# Patient Record
Sex: Female | Born: 1960 | Race: White | Hispanic: No | Marital: Married | Smoking: Former smoker
Health system: Southern US, Community
[De-identification: ages and names within clinical notes are randomized; demographics above are authoritative.]

## PROBLEM LIST (undated history)

## (undated) DIAGNOSIS — Z86718 Personal history of other venous thrombosis and embolism: Secondary | ICD-10-CM

## (undated) DIAGNOSIS — E119 Type 2 diabetes mellitus without complications: Secondary | ICD-10-CM

## (undated) DIAGNOSIS — K529 Noninfective gastroenteritis and colitis, unspecified: Secondary | ICD-10-CM

## (undated) DIAGNOSIS — M549 Dorsalgia, unspecified: Secondary | ICD-10-CM

## (undated) DIAGNOSIS — M4800 Spinal stenosis, site unspecified: Secondary | ICD-10-CM

## (undated) DIAGNOSIS — Z973 Presence of spectacles and contact lenses: Secondary | ICD-10-CM

## (undated) DIAGNOSIS — F329 Major depressive disorder, single episode, unspecified: Secondary | ICD-10-CM

## (undated) DIAGNOSIS — Z86711 Personal history of pulmonary embolism: Secondary | ICD-10-CM

## (undated) DIAGNOSIS — F988 Other specified behavioral and emotional disorders with onset usually occurring in childhood and adolescence: Secondary | ICD-10-CM

## (undated) DIAGNOSIS — N201 Calculus of ureter: Secondary | ICD-10-CM

## (undated) DIAGNOSIS — Z8719 Personal history of other diseases of the digestive system: Secondary | ICD-10-CM

## (undated) DIAGNOSIS — R3915 Urgency of urination: Secondary | ICD-10-CM

## (undated) DIAGNOSIS — I10 Essential (primary) hypertension: Secondary | ICD-10-CM

## (undated) DIAGNOSIS — J4530 Mild persistent asthma, uncomplicated: Secondary | ICD-10-CM

## (undated) DIAGNOSIS — D649 Anemia, unspecified: Secondary | ICD-10-CM

## (undated) DIAGNOSIS — M1990 Unspecified osteoarthritis, unspecified site: Secondary | ICD-10-CM

## (undated) DIAGNOSIS — R600 Localized edema: Secondary | ICD-10-CM

## (undated) DIAGNOSIS — I2699 Other pulmonary embolism without acute cor pulmonale: Secondary | ICD-10-CM

## (undated) DIAGNOSIS — G4730 Sleep apnea, unspecified: Secondary | ICD-10-CM

## (undated) DIAGNOSIS — G2581 Restless legs syndrome: Secondary | ICD-10-CM

## (undated) DIAGNOSIS — E559 Vitamin D deficiency, unspecified: Secondary | ICD-10-CM

## (undated) DIAGNOSIS — E538 Deficiency of other specified B group vitamins: Secondary | ICD-10-CM

## (undated) DIAGNOSIS — F419 Anxiety disorder, unspecified: Secondary | ICD-10-CM

## (undated) DIAGNOSIS — K219 Gastro-esophageal reflux disease without esophagitis: Secondary | ICD-10-CM

## (undated) DIAGNOSIS — R5382 Chronic fatigue, unspecified: Secondary | ICD-10-CM

## (undated) DIAGNOSIS — J189 Pneumonia, unspecified organism: Secondary | ICD-10-CM

## (undated) DIAGNOSIS — M2550 Pain in unspecified joint: Secondary | ICD-10-CM

## (undated) DIAGNOSIS — M069 Rheumatoid arthritis, unspecified: Secondary | ICD-10-CM

## (undated) DIAGNOSIS — F411 Generalized anxiety disorder: Secondary | ICD-10-CM

## (undated) DIAGNOSIS — G9332 Myalgic encephalomyelitis/chronic fatigue syndrome: Secondary | ICD-10-CM

## (undated) DIAGNOSIS — Z87442 Personal history of urinary calculi: Secondary | ICD-10-CM

## (undated) DIAGNOSIS — R0600 Dyspnea, unspecified: Secondary | ICD-10-CM

## (undated) DIAGNOSIS — L03116 Cellulitis of left lower limb: Secondary | ICD-10-CM

## (undated) DIAGNOSIS — M25562 Pain in left knee: Secondary | ICD-10-CM

## (undated) DIAGNOSIS — K589 Irritable bowel syndrome without diarrhea: Secondary | ICD-10-CM

## (undated) DIAGNOSIS — R7303 Prediabetes: Secondary | ICD-10-CM

## (undated) DIAGNOSIS — F32A Depression, unspecified: Secondary | ICD-10-CM

## (undated) DIAGNOSIS — M797 Fibromyalgia: Secondary | ICD-10-CM

## (undated) HISTORY — DX: Personal history of other venous thrombosis and embolism: Z86.718

## (undated) HISTORY — DX: Myalgic encephalomyelitis/chronic fatigue syndrome: G93.32

## (undated) HISTORY — DX: Prediabetes: R73.03

## (undated) HISTORY — DX: Dyspnea, unspecified: R06.00

## (undated) HISTORY — DX: Anxiety disorder, unspecified: F41.9

## (undated) HISTORY — DX: Chronic fatigue, unspecified: R53.82

## (undated) HISTORY — DX: Irritable bowel syndrome, unspecified: K58.9

## (undated) HISTORY — DX: Type 2 diabetes mellitus without complications: E11.9

## (undated) HISTORY — DX: Localized edema: R60.0

## (undated) HISTORY — DX: Sleep apnea, unspecified: G47.30

## (undated) HISTORY — DX: Vitamin D deficiency, unspecified: E55.9

## (undated) HISTORY — DX: Unspecified osteoarthritis, unspecified site: M19.90

## (undated) HISTORY — DX: Rheumatoid arthritis, unspecified: M06.9

## (undated) HISTORY — DX: Other specified behavioral and emotional disorders with onset usually occurring in childhood and adolescence: F98.8

## (undated) HISTORY — DX: Depression, unspecified: F32.A

## (undated) HISTORY — DX: Anemia, unspecified: D64.9

## (undated) HISTORY — PX: MOUTH SURGERY: SHX715

## (undated) HISTORY — DX: Pain in left knee: M25.562

## (undated) HISTORY — PX: EYE SURGERY: SHX253

## (undated) HISTORY — DX: Noninfective gastroenteritis and colitis, unspecified: K52.9

## (undated) HISTORY — DX: Spinal stenosis, site unspecified: M48.00

## (undated) HISTORY — DX: Major depressive disorder, single episode, unspecified: F32.9

## (undated) HISTORY — DX: Pain in unspecified joint: M25.50

## (undated) HISTORY — DX: Dorsalgia, unspecified: M54.9

## (undated) HISTORY — DX: Gastro-esophageal reflux disease without esophagitis: K21.9

## (undated) HISTORY — DX: Deficiency of other specified B group vitamins: E53.8

## (undated) HISTORY — DX: Restless legs syndrome: G25.81

---

## 1997-11-13 ENCOUNTER — Other Ambulatory Visit: Admission: RE | Admit: 1997-11-13 | Discharge: 1997-11-13 | Payer: Self-pay | Admitting: Obstetrics and Gynecology

## 1997-12-23 ENCOUNTER — Encounter: Admission: RE | Admit: 1997-12-23 | Discharge: 1998-03-23 | Payer: Self-pay | Admitting: Gynecology

## 1998-02-07 ENCOUNTER — Inpatient Hospital Stay (HOSPITAL_COMMUNITY): Admission: AD | Admit: 1998-02-07 | Discharge: 1998-02-07 | Payer: Self-pay | Admitting: Ophthalmology

## 1998-04-16 ENCOUNTER — Encounter: Admission: RE | Admit: 1998-04-16 | Discharge: 1998-07-15 | Payer: Self-pay | Admitting: Obstetrics and Gynecology

## 1998-06-06 ENCOUNTER — Inpatient Hospital Stay (HOSPITAL_COMMUNITY): Admission: AD | Admit: 1998-06-06 | Discharge: 1998-06-06 | Payer: Self-pay | Admitting: Obstetrics and Gynecology

## 1998-06-07 ENCOUNTER — Inpatient Hospital Stay (HOSPITAL_COMMUNITY): Admission: AD | Admit: 1998-06-07 | Discharge: 1998-06-07 | Payer: Self-pay | Admitting: Gynecology

## 1998-06-14 ENCOUNTER — Inpatient Hospital Stay (HOSPITAL_COMMUNITY): Admission: AD | Admit: 1998-06-14 | Discharge: 1998-06-17 | Payer: Self-pay | Admitting: Obstetrics and Gynecology

## 1998-07-26 ENCOUNTER — Other Ambulatory Visit: Admission: RE | Admit: 1998-07-26 | Discharge: 1998-07-26 | Payer: Self-pay | Admitting: Gynecology

## 2000-03-20 ENCOUNTER — Encounter: Admission: RE | Admit: 2000-03-20 | Discharge: 2000-03-20 | Payer: Self-pay | Admitting: Orthopaedic Surgery

## 2000-03-20 ENCOUNTER — Encounter: Payer: Self-pay | Admitting: Orthopaedic Surgery

## 2000-05-30 ENCOUNTER — Other Ambulatory Visit: Admission: RE | Admit: 2000-05-30 | Discharge: 2000-05-30 | Payer: Self-pay | Admitting: Obstetrics and Gynecology

## 2000-06-13 ENCOUNTER — Other Ambulatory Visit: Admission: RE | Admit: 2000-06-13 | Discharge: 2000-06-13 | Payer: Self-pay | Admitting: Obstetrics and Gynecology

## 2000-06-13 ENCOUNTER — Encounter (INDEPENDENT_AMBULATORY_CARE_PROVIDER_SITE_OTHER): Payer: Self-pay

## 2001-06-19 ENCOUNTER — Other Ambulatory Visit: Admission: RE | Admit: 2001-06-19 | Discharge: 2001-06-19 | Payer: Self-pay | Admitting: Obstetrics and Gynecology

## 2002-12-11 ENCOUNTER — Other Ambulatory Visit: Admission: RE | Admit: 2002-12-11 | Discharge: 2002-12-11 | Payer: Self-pay | Admitting: Obstetrics and Gynecology

## 2004-01-18 ENCOUNTER — Other Ambulatory Visit: Admission: RE | Admit: 2004-01-18 | Discharge: 2004-01-18 | Payer: Self-pay | Admitting: Obstetrics and Gynecology

## 2005-08-28 ENCOUNTER — Other Ambulatory Visit: Admission: RE | Admit: 2005-08-28 | Discharge: 2005-08-28 | Payer: Self-pay | Admitting: Obstetrics and Gynecology

## 2006-09-03 ENCOUNTER — Other Ambulatory Visit: Admission: RE | Admit: 2006-09-03 | Discharge: 2006-09-03 | Payer: Self-pay | Admitting: Obstetrics and Gynecology

## 2007-11-05 ENCOUNTER — Other Ambulatory Visit: Admission: RE | Admit: 2007-11-05 | Discharge: 2007-11-05 | Payer: Self-pay | Admitting: Gynecology

## 2010-07-31 HISTORY — PX: COLONOSCOPY: SHX174

## 2010-09-13 ENCOUNTER — Encounter: Payer: Self-pay | Admitting: Gynecology

## 2010-09-22 ENCOUNTER — Other Ambulatory Visit: Payer: Self-pay | Admitting: Gynecology

## 2010-09-26 ENCOUNTER — Encounter: Payer: Self-pay | Admitting: Gynecology

## 2010-09-29 ENCOUNTER — Encounter: Payer: Self-pay | Admitting: Gynecology

## 2010-09-30 ENCOUNTER — Inpatient Hospital Stay (INDEPENDENT_AMBULATORY_CARE_PROVIDER_SITE_OTHER)
Admission: RE | Admit: 2010-09-30 | Discharge: 2010-09-30 | Disposition: A | Discharge status: Home / Self Care | Payer: 59 | Source: Ambulatory Visit | Attending: Emergency Medicine | Admitting: Emergency Medicine

## 2010-09-30 DIAGNOSIS — J45909 Unspecified asthma, uncomplicated: Secondary | ICD-10-CM

## 2010-09-30 DIAGNOSIS — J019 Acute sinusitis, unspecified: Secondary | ICD-10-CM

## 2010-10-27 ENCOUNTER — Other Ambulatory Visit (HOSPITAL_COMMUNITY)
Admission: RE | Admit: 2010-10-27 | Discharge: 2010-10-27 | Disposition: A | Discharge status: Home / Self Care | Payer: 59 | Source: Ambulatory Visit | Attending: Gynecology | Admitting: Gynecology

## 2010-10-27 ENCOUNTER — Ambulatory Visit (INDEPENDENT_AMBULATORY_CARE_PROVIDER_SITE_OTHER): Payer: 59 | Admitting: Gynecology

## 2010-10-27 ENCOUNTER — Other Ambulatory Visit: Payer: Self-pay | Admitting: Gynecology

## 2010-10-27 DIAGNOSIS — Z01419 Encounter for gynecological examination (general) (routine) without abnormal findings: Secondary | ICD-10-CM

## 2010-10-27 DIAGNOSIS — N920 Excessive and frequent menstruation with regular cycle: Secondary | ICD-10-CM

## 2010-10-27 DIAGNOSIS — Z124 Encounter for screening for malignant neoplasm of cervix: Secondary | ICD-10-CM | POA: Insufficient documentation

## 2010-10-27 DIAGNOSIS — B373 Candidiasis of vulva and vagina: Secondary | ICD-10-CM

## 2010-10-27 DIAGNOSIS — Z13220 Encounter for screening for lipoid disorders: Secondary | ICD-10-CM

## 2010-10-27 DIAGNOSIS — B3731 Acute candidiasis of vulva and vagina: Secondary | ICD-10-CM

## 2010-10-27 DIAGNOSIS — R82998 Other abnormal findings in urine: Secondary | ICD-10-CM

## 2010-10-27 DIAGNOSIS — Z833 Family history of diabetes mellitus: Secondary | ICD-10-CM

## 2011-06-09 ENCOUNTER — Emergency Department (HOSPITAL_COMMUNITY)
Admission: EM | Admit: 2011-06-09 | Discharge: 2011-06-10 | Disposition: A | Discharge status: Home / Self Care | Payer: Worker's Compensation | Attending: Emergency Medicine | Admitting: Emergency Medicine

## 2011-06-09 DIAGNOSIS — W19XXXA Unspecified fall, initial encounter: Secondary | ICD-10-CM

## 2011-06-09 DIAGNOSIS — S93409A Sprain of unspecified ligament of unspecified ankle, initial encounter: Secondary | ICD-10-CM | POA: Insufficient documentation

## 2011-06-09 DIAGNOSIS — M25569 Pain in unspecified knee: Secondary | ICD-10-CM | POA: Insufficient documentation

## 2011-06-09 DIAGNOSIS — S8000XA Contusion of unspecified knee, initial encounter: Secondary | ICD-10-CM | POA: Insufficient documentation

## 2011-06-09 DIAGNOSIS — M25539 Pain in unspecified wrist: Secondary | ICD-10-CM | POA: Insufficient documentation

## 2011-06-09 DIAGNOSIS — S63509A Unspecified sprain of unspecified wrist, initial encounter: Secondary | ICD-10-CM | POA: Insufficient documentation

## 2011-06-09 DIAGNOSIS — T148XXA Other injury of unspecified body region, initial encounter: Secondary | ICD-10-CM

## 2011-06-09 DIAGNOSIS — J45909 Unspecified asthma, uncomplicated: Secondary | ICD-10-CM | POA: Insufficient documentation

## 2011-06-09 DIAGNOSIS — M25579 Pain in unspecified ankle and joints of unspecified foot: Secondary | ICD-10-CM | POA: Insufficient documentation

## 2011-06-09 DIAGNOSIS — M7989 Other specified soft tissue disorders: Secondary | ICD-10-CM | POA: Insufficient documentation

## 2011-06-09 DIAGNOSIS — R296 Repeated falls: Secondary | ICD-10-CM | POA: Insufficient documentation

## 2011-06-09 DIAGNOSIS — S96919A Strain of unspecified muscle and tendon at ankle and foot level, unspecified foot, initial encounter: Secondary | ICD-10-CM

## 2011-06-09 HISTORY — DX: Unspecified osteoarthritis, unspecified site: M19.90

## 2011-06-09 HISTORY — DX: Fibromyalgia: M79.7

## 2011-06-10 ENCOUNTER — Emergency Department (HOSPITAL_COMMUNITY): Payer: Worker's Compensation

## 2011-06-10 ENCOUNTER — Encounter: Payer: Self-pay | Admitting: *Deleted

## 2011-06-10 MED ORDER — HYDROCODONE-ACETAMINOPHEN 5-325 MG PO TABS
2.0000 | ORAL_TABLET | Freq: Once | ORAL | Status: AC
  Administered 2011-06-10: 2 via ORAL
  Filled 2011-06-10: qty 2

## 2011-06-10 NOTE — ED Provider Notes (Signed)
History     CSN: [id number] Arrival date & time: 06/09/2011 11:57 PM   First MD Initiated Contact with Patient 06/10/11 0024      Chief Complaint  Patient presents with  . Fall    Patient is a 50 y.o. female presenting with fall.  Fall Pertinent negatives include no numbness and no headaches.   history provided by the patient. Patient reports tripping and falling while outside earlier today around 3 PM. Patient was helping schoolchildren get on a bus. She fell forward onto her hands and knees. Also states that she thinks she twisted right ankle. She went home to arrest but has not gotten any improvement in pain. She is worried she may have done more damage than she originally thought. She has a greatest amount of pain in left knee. She has continued to be ambulatory but notes having to limp somewhat. She also states she is unable to extend the right wrist fully. She denies numbness or tingling. There was no loss of consciousness or head injury.    Past Medical History  Diagnosis Date  . Asthma   . Arthritis   . Fibromyalgia     History reviewed. No pertinent past surgical history.  History reviewed. No pertinent family history.  History  Substance Use Topics  . Smoking status: Never Smoker   . Smokeless tobacco: Not on file  . Alcohol Use: No    OB History    Grav Para Term Preterm Abortions TAB SAB Ect Mult Living                  Review of Systems  Respiratory: Negative for cough and shortness of breath.   Cardiovascular: Negative for chest pain and palpitations.  Neurological: Negative for dizziness, weakness, numbness and headaches.  All other systems reviewed and are negative.    Allergies  Review of patient's allergies indicates no known allergies.  Home Medications   Current Outpatient Rx  Name Route Sig Dispense Refill  . DULOXETINE HCL 60 MG PO CPEP Oral Take 60 mg by mouth daily.      . IBUPROFEN 200 MG PO TABS Oral Take 200-400 mg by mouth every 6  (six) hours as needed. For pain     . THERA M PLUS PO TABS Oral Take 1 tablet by mouth daily.        BP 126/81  Pulse 95  Temp(Src) 97.1 F (36.2 C) (Oral)  Resp 20  SpO2 97%  LMP 06/02/2011  Physical Exam  Nursing note and vitals reviewed. Constitutional: She is oriented to person, place, and time. She appears well-developed and well-nourished. No distress.  HENT:  Head: Normocephalic and atraumatic.       No Battle sign or raccoon eyes  Eyes: Conjunctivae are normal.  Neck: Normal range of motion. Neck supple.       No cervical midline tenderness.  Cardiovascular: Normal rate.   No murmur heard. Pulmonary/Chest: Effort normal and breath sounds normal.  Abdominal: Soft. There is no tenderness.  Musculoskeletal:       Mild diffuse dorsal swelling to right wrist with reduced range of motion. Positive snuffbox tenderness. Full range of motion of the fingers with normal sensation and some Refill. Contusion to medial lower left knee with swelling around knee and tenderness to palpation. no deformity of the knee. Full range of motion of left knee. Mild swelling over anterior lateral right ankle. Tenderness to palpation over proximal right fifth metatarsal. All movement of toes with normal  sensation and cap refill.  Neurological: She is alert and oriented to person, place, and time.  Skin: Skin is warm.    ED Course  Procedures (including critical care time)  Labs Reviewed - No data to display Dg Wrist Complete Right  06/10/2011  *RADIOLOGY REPORT*  Clinical Data: Status post fall; medial right wrist pain, extending to the fifth metacarpal.  RIGHT WRIST - COMPLETE 3+ VIEW  Comparison: None.  Findings: There is no evidence of fracture or dislocation.  The carpal rows are intact, and demonstrate normal alignment. Significant degenerative change is noted at the first carpometacarpal joint, with bony remodelling and subcortical cyst formation.  Negative ulnar variance is noted.  No  significant soft tissue abnormalities are seen.  IMPRESSION:  1.  No evidence of fracture or dislocation. 2.  Significant degenerative change at the first carpometacarpal joint.  Original Report Authenticated By: Tonia Ghent, M.D.   Dg Ankle Complete Right  06/10/2011  *RADIOLOGY REPORT*  Clinical Data: Status post fall, with right ankle pain.  RIGHT ANKLE - COMPLETE 3+ VIEW  Comparison: None.  Findings: There is no evidence of fracture or dislocation.  The ankle mortise is intact; the interosseous space is within normal limits.  No talar tilt or subluxation is seen. A plantar calcaneal spur is seen.  An apparent os peroneum is noted.  Small osseous fragments along the dorsum of the foot are likely degenerative in nature.  The joint spaces are preserved.  Dorsal soft tissue swelling is noted.  IMPRESSION:  1.  No evidence of fracture or dislocation. 2.  Os peroneum noted.  Original Report Authenticated By: Tonia Ghent, M.D.   Dg Knee Complete 4 Views Left  06/10/2011  *RADIOLOGY REPORT*  Clinical Data: Status post fall, with left anterior knee pain and swelling.  LEFT KNEE - COMPLETE 4+ VIEW  Comparison: None.  Findings: There is no evidence of fracture or dislocation.  There is narrowing of the medial and patellofemoral compartments, and marginal osteophytes are noted arising at all three compartments.  No significant joint effusion is seen.  The visualized soft tissues are normal in appearance.  IMPRESSION:  1.  No evidence of fracture or dislocation. 2.  Degenerative change at the left knee, with narrowing at the medial and patellofemoral compartments.  Original Report Authenticated By: Tonia Ghent, M.D.      1. Fall   2. Contusion   3. Wrist sprain   4. Ankle strain       MDM  Patient in no acute distress. X-rays ordered. Pain medication ordered.        Angus Seller, PA 06/10/11 1025

## 2011-06-10 NOTE — ED Notes (Signed)
The pt returned from xray.  Her pain is some better

## 2011-06-10 NOTE — ED Provider Notes (Signed)
Medical screening examination/treatment/procedure(s) were performed by non-physician practitioner and as supervising physician I was immediately available for consultation/collaboration.  Cyndra Numbers, MD 06/10/11 [phone]

## 2011-06-10 NOTE — ED Notes (Signed)
The pt fell on the concrete 1500 today .[name]  She has pain all over her body both knees  Rt foot and she has pain and stiffness rt wrsit.

## 2011-08-08 ENCOUNTER — Emergency Department (HOSPITAL_COMMUNITY): Payer: Self-pay

## 2011-08-08 ENCOUNTER — Encounter (HOSPITAL_COMMUNITY): Payer: Self-pay | Admitting: Emergency Medicine

## 2011-08-08 ENCOUNTER — Emergency Department (HOSPITAL_COMMUNITY)
Admission: EM | Admit: 2011-08-08 | Discharge: 2011-08-09 | Disposition: A | Discharge status: Home / Self Care | Payer: Self-pay | Attending: Emergency Medicine | Admitting: Emergency Medicine

## 2011-08-08 DIAGNOSIS — R197 Diarrhea, unspecified: Secondary | ICD-10-CM | POA: Insufficient documentation

## 2011-08-08 DIAGNOSIS — R5381 Other malaise: Secondary | ICD-10-CM | POA: Insufficient documentation

## 2011-08-08 DIAGNOSIS — M129 Arthropathy, unspecified: Secondary | ICD-10-CM | POA: Insufficient documentation

## 2011-08-08 DIAGNOSIS — R109 Unspecified abdominal pain: Secondary | ICD-10-CM | POA: Insufficient documentation

## 2011-08-08 DIAGNOSIS — J45909 Unspecified asthma, uncomplicated: Secondary | ICD-10-CM | POA: Insufficient documentation

## 2011-08-08 DIAGNOSIS — K529 Noninfective gastroenteritis and colitis, unspecified: Secondary | ICD-10-CM

## 2011-08-08 DIAGNOSIS — K5289 Other specified noninfective gastroenteritis and colitis: Secondary | ICD-10-CM | POA: Insufficient documentation

## 2011-08-08 DIAGNOSIS — R630 Anorexia: Secondary | ICD-10-CM | POA: Insufficient documentation

## 2011-08-08 DIAGNOSIS — Z79899 Other long term (current) drug therapy: Secondary | ICD-10-CM | POA: Insufficient documentation

## 2011-08-08 DIAGNOSIS — R5383 Other fatigue: Secondary | ICD-10-CM | POA: Insufficient documentation

## 2011-08-08 LAB — URINE MICROSCOPIC-ADD ON

## 2011-08-08 LAB — CBC
HCT: 38.4 % (ref 36.0–46.0)
MCHC: 33.3 g/dL (ref 30.0–36.0)
MCV: 85 fL (ref 78.0–100.0)
Platelets: 309 10*3/uL (ref 150–400)
RDW: 14.6 % (ref 11.5–15.5)
WBC: 7.6 10*3/uL (ref 4.0–10.5)

## 2011-08-08 LAB — URINALYSIS, ROUTINE W REFLEX MICROSCOPIC
Glucose, UA: NEGATIVE mg/dL
Hgb urine dipstick: NEGATIVE
Protein, ur: NEGATIVE mg/dL
Specific Gravity, Urine: 1.025 (ref 1.005–1.030)

## 2011-08-08 LAB — COMPREHENSIVE METABOLIC PANEL
ALT: 21 U/L (ref 0–35)
AST: 26 U/L (ref 0–37)
Albumin: 3.7 g/dL (ref 3.5–5.2)
CO2: 25 mEq/L (ref 19–32)
Calcium: 9 mg/dL (ref 8.4–10.5)
Creatinine, Ser: 0.77 mg/dL (ref 0.50–1.10)
GFR calc non Af Amer: >90 mL/min (ref >90)
Sodium: 136 mEq/L (ref 135–145)
Total Protein: 7.9 g/dL (ref 6.0–8.3)

## 2011-08-08 LAB — DIFFERENTIAL
Basophils Absolute: 0 10*3/uL (ref 0.0–0.1)
Basophils Relative: 0 % (ref 0–1)
Eosinophils Absolute: 0.1 10*3/uL (ref 0.0–0.7)
Eosinophils Relative: 1 % (ref 0–5)
Lymphocytes Relative: 27 % (ref 12–46)
Monocytes Absolute: 0.6 10*3/uL (ref 0.1–1.0)

## 2011-08-08 MED ORDER — SODIUM CHLORIDE 0.9 % IV SOLN
Freq: Once | INTRAVENOUS | Status: AC
  Administered 2011-08-08: 21:00:00 via INTRAVENOUS

## 2011-08-08 MED ORDER — ONDANSETRON HCL 4 MG/2ML IJ SOLN
4.0000 mg | Freq: Once | INTRAMUSCULAR | Status: AC
  Administered 2011-08-08: 4 mg via INTRAVENOUS
  Filled 2011-08-08: qty 2

## 2011-08-08 MED ORDER — KETOROLAC TROMETHAMINE 30 MG/ML IJ SOLN
30.0000 mg | Freq: Once | INTRAMUSCULAR | Status: AC
  Administered 2011-08-08: 30 mg via INTRAVENOUS
  Filled 2011-08-08: qty 1

## 2011-08-08 MED ORDER — IOHEXOL 300 MG/ML  SOLN
20.0000 mL | INTRAMUSCULAR | Status: DC | PRN
  Administered 2011-08-09: 20 mL via ORAL

## 2011-08-08 NOTE — ED Provider Notes (Signed)
History     CSN: [id number]  Arrival date & time 08/08/11  1600   First MD Initiated Contact with Patient 08/08/11 2012      Chief Complaint  Patient presents with  . Abdominal Pain  . Diarrhea    (Consider location/radiation/quality/duration/timing/severity/associated sxs/prior treatment) Patient is a 51 y.o. female presenting with abdominal pain and diarrhea. The history is provided by the patient.  Abdominal Pain The primary symptoms of the illness include abdominal pain, fatigue and diarrhea. The primary symptoms of the illness do not include fever. The current episode started more than 2 days ago. The onset of the illness was gradual. The problem has been gradually worsening.  The patient states that she believes she is currently not pregnant. The patient has had a change in bowel habit. Additional symptoms associated with the illness include anorexia. Symptoms associated with the illness do not include chills, constipation, urgency, hematuria, frequency or back pain.  Diarrhea The primary symptoms include fatigue, abdominal pain and diarrhea. Primary symptoms do not include fever.  The illness is also significant for anorexia. The illness does not include chills, constipation or back pain.    Past Medical History  Diagnosis Date  . Asthma   . Arthritis   . Fibromyalgia     History reviewed. No pertinent past surgical history.  History reviewed. No pertinent family history.  History  Substance Use Topics  . Smoking status: Never Smoker   . Smokeless tobacco: Not on file  . Alcohol Use: No    OB History    Grav Para Term Preterm Abortions TAB SAB Ect Mult Living                  Review of Systems  Constitutional: Positive for fatigue. Negative for fever and chills.  Gastrointestinal: Positive for abdominal pain, diarrhea and anorexia. Negative for constipation.  Genitourinary: Negative for urgency, frequency and hematuria.  Musculoskeletal: Negative for back  pain.  All other systems reviewed and are negative.    Allergies  Review of patient's allergies indicates no known allergies.  Home Medications   Current Outpatient Rx  Name Route Sig Dispense Refill  . ALBUTEROL SULFATE HFA 108 (90 BASE) MCG/ACT IN AERS Inhalation Inhale 2 puffs into the lungs every 6 (six) hours as needed. FOR WHEEZING     . DIPHENHYDRAMINE-APAP (SLEEP) 25-500 MG PO TABS Oral Take 1 tablet by mouth at bedtime as needed. FOR SLEEP     . IBUPROFEN 200 MG PO TABS Oral Take 200-400 mg by mouth every 6 (six) hours as needed. For pain     . THERA M PLUS PO TABS Oral Take 1 tablet by mouth daily.      [name] PREGABALIN 150 MG PO CAPS Oral Take 150 mg by mouth daily.        BP 110/81  Pulse 91  Temp 97.5 F (36.4 C)  Resp 17  SpO2 98%  Physical Exam  Nursing note and vitals reviewed. Constitutional: She is oriented to person, place, and time. She appears well-developed and well-nourished. No distress.  HENT:  Head: Normocephalic and atraumatic.       Mm's dry  Neck: Normal range of motion. Neck supple.  Cardiovascular: Normal rate and regular rhythm.   No murmur heard. Pulmonary/Chest: Effort normal and breath sounds normal. No respiratory distress.  Abdominal: Soft. Bowel sounds are normal.       There is ttp in the lower abdomen, llq > rlq.  No rebound or guarding.  Musculoskeletal: Normal range of motion. She exhibits no edema.  Lymphadenopathy:    She has no cervical adenopathy.  Neurological: She is alert and oriented to person, place, and time.  Skin: Skin is warm. She is not diaphoretic.    ED Course  Procedures (including critical care time)   Labs Reviewed  CBC  DIFFERENTIAL  COMPREHENSIVE METABOLIC PANEL  LIPASE, BLOOD  URINALYSIS, ROUTINE W REFLEX MICROSCOPIC   No results found.   No diagnosis found.    MDM  The patient presents with a three day history of n/v/d and lower abdominal pain.  She has been unable to tolerate much po  intake.  The labs show a normal wbc count, uti, and mild elevation of the lipase.  As of now, the patient is waiting to undergo a ct scan of the abdomen and pelvis. The care will be signed out to Dr. Patria Mane who will follow up the results of the ct scan and determine the appropriate disposition.        Geoffery Lyons, MD 08/09/11 [phone]

## 2011-08-08 NOTE — ED Notes (Signed)
Pt c/o lower abd pain with diarrhea x 3 days; pt sts decreased PO intake; pt sts some chills

## 2011-08-08 NOTE — ED Notes (Signed)
Pt currently drinking contrast for CT. Resting comfortably.

## 2011-08-09 LAB — STOOL CULTURE

## 2011-08-09 LAB — CLOSTRIDIUM DIFFICILE BY PCR: Toxigenic C. Difficile by PCR: NEGATIVE

## 2011-08-09 LAB — URINE CULTURE: Colony Count: >=100000

## 2011-08-09 MED ORDER — METRONIDAZOLE 500 MG PO TABS
500.0000 mg | ORAL_TABLET | Freq: Once | ORAL | Status: AC
  Administered 2011-08-09: 500 mg via ORAL
  Filled 2011-08-09: qty 1

## 2011-08-09 MED ORDER — CIPROFLOXACIN HCL 500 MG PO TABS: 500.0000 mg | ORAL_TABLET | Freq: Two times a day (BID) | ORAL | Status: AC

## 2011-08-09 MED ORDER — HYDROCODONE-ACETAMINOPHEN 5-500 MG PO TABS: 1.0000 | ORAL_TABLET | Freq: Four times a day (QID) | ORAL | Status: AC | PRN

## 2011-08-09 MED ORDER — METRONIDAZOLE 500 MG PO TABS: 500.0000 mg | ORAL_TABLET | Freq: Two times a day (BID) | ORAL | Status: AC

## 2011-08-09 MED ORDER — DEXTROSE 5 % IV SOLN
1.0000 g | INTRAVENOUS | Status: DC
  Administered 2011-08-09: 1 g via INTRAVENOUS
  Filled 2011-08-09: qty 10

## 2011-08-09 MED ORDER — IOHEXOL 300 MG/ML  SOLN
100.0000 mL | Freq: Once | INTRAMUSCULAR | Status: AC | PRN
  Administered 2011-08-09: 100 mL via INTRAVENOUS

## 2011-08-09 MED ORDER — CIPROFLOXACIN HCL 500 MG PO TABS
500.0000 mg | ORAL_TABLET | Freq: Once | ORAL | Status: AC
  Administered 2011-08-09: 500 mg via ORAL
  Filled 2011-08-09: qty 1

## 2011-08-09 NOTE — ED Provider Notes (Signed)
3:16 AM Patient feels much better at this time.  Her CT scan demonstrates colitis which is nonspecific.  Given her watery diffuse diarrhea that has a very foul smell I will cover the patient for possible C. difficile colitis.  She's been given a prescription for Cipro and Flagyl.  C. difficile by PCR as well as stool cultures have been obtained and pending.  She understands to return to the emergency department for new or worsening symptoms.  She also understands the importance of close followup and development of a relationship with a primary care physician.  She reports she feels much better at this time.  Her urine may represent an infection, urine culture has been obtained, the patient received Rocephin in the emergency department should be covered by ciprofloxacin at home.  She does report mild dysuria. her vital signs are normal she feels much better and is agreeable to outpatient plan  Ct Abdomen Pelvis W Contrast  08/09/2011  *RADIOLOGY REPORT*  Clinical Data: Abdominal pain.  Chills.  Nausea and vomiting.  CT ABDOMEN AND PELVIS WITH CONTRAST  Technique:  Multidetector CT imaging of the abdomen and pelvis was performed following the standard protocol during bolus administration of intravenous contrast.  Contrast:  100 ml Omnipaque-300.  Comparison: None.  Findings: Lung bases are clear.  Visualized heart is normal.  Liver and gallbladder appear within normal limits.  There is diffuse thickening extending from cecum to mid transverse colon. Thickening becomes more mild approaching the splenic flexure.  The descending colon and sigmoid appear within normal limits.  Uterus and adnexa normal.  Urinary bladder normal.  Normal renal enhancement.  Both adrenal glands appear normal.  No adenopathy.  The pancreas appears normal.  No calcified gallstones. The abdominal vasculature appears within normal limits.  In the dome of the left hepatic lobe, there is a nonspecific sub centimeter liver lesion that probably  represents a cyst measuring 8 mm (image 8 series 2) but is incompletely characterized.  Small bowel appears within normal limits.  Ileocecal valve and appendix appear normal.  Thoracolumbar spondylosis.  No aggressive osseous lesions are identified.  4 mm left inferior pole nonobstructing renal collecting system calculus is present.  IMPRESSION: 1.  Diffuse colonic mural thickening extending from cecum to the splenic flexure.  Findings compatible with colitis.  Differential considerations include infection, inflammatory bowel disease with ischemia unlikely. 2.  Nonobstructing 4 mm left inferior pole renal collecting system calculus. 3.  Tiny low density lesion in the dome of the left hepatic lobe, incompletely evaluated but probably representing a cyst.  Original Report Authenticated By: Andreas Newport, M.D.   I personally reviewed his CT scan  Lyanne Co, MD 08/09/11 [phone]

## 2011-08-09 NOTE — ED Notes (Signed)
See paper charting 

## 2011-08-10 ENCOUNTER — Telehealth: Payer: Self-pay | Admitting: *Deleted

## 2011-08-10 MED ORDER — FLUCONAZOLE 200 MG PO TABS: 200.0000 mg | ORAL_TABLET | Freq: Every day | ORAL | Status: AC

## 2011-08-10 NOTE — Telephone Encounter (Signed)
Diflucan 200 mg x2 days

## 2011-08-10 NOTE — Telephone Encounter (Signed)
Patient called today to say she has been in the hospital with an intestine infection and has been on a number of antibiotics.  Is at home now in bed and has developed a yeast infection.  Wants to know if you could rx something for the yeast? Please advise.

## 2011-08-10 NOTE — Telephone Encounter (Signed)
Lm on vm that rx has been sent in.

## 2012-08-05 ENCOUNTER — Encounter: Payer: Self-pay | Admitting: Gynecology

## 2012-08-16 ENCOUNTER — Other Ambulatory Visit (HOSPITAL_COMMUNITY)
Admission: RE | Admit: 2012-08-16 | Discharge: 2012-08-16 | Disposition: A | Discharge status: Home / Self Care | Payer: BC Managed Care – PPO | Source: Ambulatory Visit | Attending: Gynecology | Admitting: Gynecology

## 2012-08-16 ENCOUNTER — Ambulatory Visit (INDEPENDENT_AMBULATORY_CARE_PROVIDER_SITE_OTHER): Payer: BC Managed Care – PPO | Admitting: Gynecology

## 2012-08-16 ENCOUNTER — Encounter: Payer: Self-pay | Admitting: Gynecology

## 2012-08-16 VITALS — BP 124/84 | Ht 65.0 in | Wt 250.0 lb

## 2012-08-16 DIAGNOSIS — N926 Irregular menstruation, unspecified: Secondary | ICD-10-CM

## 2012-08-16 DIAGNOSIS — Z1151 Encounter for screening for human papillomavirus (HPV): Secondary | ICD-10-CM | POA: Insufficient documentation

## 2012-08-16 DIAGNOSIS — G2581 Restless legs syndrome: Secondary | ICD-10-CM | POA: Insufficient documentation

## 2012-08-16 DIAGNOSIS — K589 Irritable bowel syndrome without diarrhea: Secondary | ICD-10-CM | POA: Insufficient documentation

## 2012-08-16 DIAGNOSIS — Z01419 Encounter for gynecological examination (general) (routine) without abnormal findings: Secondary | ICD-10-CM

## 2012-08-16 DIAGNOSIS — K529 Noninfective gastroenteritis and colitis, unspecified: Secondary | ICD-10-CM | POA: Insufficient documentation

## 2012-08-16 DIAGNOSIS — N951 Menopausal and female climacteric states: Secondary | ICD-10-CM

## 2012-08-16 NOTE — Patient Instructions (Signed)
Follow up with your primary physician for blood work and evaluation Schedule colonoscopy Follow up in one year for annual exam.  Call if prolonged or atypical bleeding

## 2012-08-16 NOTE — Progress Notes (Signed)
Sally [name] 30-Jan-1961 [id number]        52 y.o.  [id number] for annual exam.  Several issues noted below.  Past medical history,surgical history, medications, allergies, family history and social history were all reviewed and documented in the EPIC chart. ROS:  Was performed and pertinent positives and negatives are included in the history.  Exam: [facility] assistant Filed Vitals:   08/16/12 1049  BP: 124/84  Height: 5\' 5"  (1.651 m)  Weight: 250 lb (113.399 kg)   General appearance  Normal Skin grossly normal Head/Neck normal with no cervical or supraclavicular adenopathy thyroid normal Lungs  clear Cardiac RR, without RMG Abdominal  soft, nontender, without masses, organomegaly or hernia Breasts  examined lying and sitting without masses, retractions, discharge or axillary adenopathy. Pelvic  Ext/BUS/vagina  normal   Cervix  normal Pap/HPV  Uterus  Grossly normal normal size,  midline and mobile nontender   Adnexa  Without masses or tenderness    Anus and perineum  normal   Rectovaginal  normal sphincter tone without palpated masses or tenderness.    Assessment/Plan:  52 y.o. [id number] female for annual exam.   1. Perimenopausal. Patient having less frequent menses as they're spacing out this past year with LMP in November. No prolonged or atypical bleeding. Also having some hot flashes and sweats. Did have elevated FSH last year. Reviewed options to include observation, OTC soy based, HRT and nonpharmacologic prescriptions such as Effexor. At this point patient is not interested in treating. She would prefer observing and will consider OTC soy if the symptoms worsen or alternatives and will represent to discuss. I reviewed bleeding with her and as long as she has less frequent but normal menses then will watch. If she has prolonged or atypical bleeding or gross more than one year without bleeding and then has bleeding she knows to call. 2. Mammography 2012. Patient has mammogram  scheduled today and will follow up for this. SBE monthly reviewed. 3. Pap smear 2012. Pap/HPV done. No history of significant abnormalities and if this one is normal then we'll plan a less frequent screening interval. 4. Colonoscopy. Patient's ever had. I strongly recommended she schedule she somewhat reluctant and I stressed the need to do so and the benefits of early detection. 5. Health maintenance. Patient's having a lot of joint pain and back pain which I think is weight related. We discussed her weight and she just joined [employer] and started an exercise program. I recommended she follow up with her primary physician for evaluation and blood work to include cholesterol/diabetes screening. She is off of her medications as she used previously for her fibromyalgia and her pain have recommended she rediscuss this with her primary physicians and consider restarting her medications to include Cymbalta.  Again no blood work was done today as given her age and weight and other issues I think since she is needs to be seen by a primary physician for ongoing health maintenance. Patient agrees to schedule this appointment.  Follow up with me in a year or sooner if she wants to rediscuss menopausal symptom treatment or if her bleeding becomes significantly irregular.    Dara Lords MD, 11:57 AM 08/16/2012

## 2012-08-17 LAB — URINALYSIS W MICROSCOPIC + REFLEX CULTURE
Bacteria, UA: NONE SEEN
Casts: NONE SEEN
Glucose, UA: NEGATIVE mg/dL
Hgb urine dipstick: NEGATIVE
Ketones, ur: NEGATIVE mg/dL
Protein, ur: NEGATIVE mg/dL
pH: 5.5 (ref 5.0–8.0)

## 2012-08-19 ENCOUNTER — Encounter: Payer: Self-pay | Admitting: Gynecology

## 2012-10-08 DIAGNOSIS — Z029 Encounter for administrative examinations, unspecified: Secondary | ICD-10-CM

## 2013-03-20 ENCOUNTER — Other Ambulatory Visit: Payer: Self-pay

## 2014-04-24 ENCOUNTER — Encounter: Payer: Self-pay | Admitting: Gynecology

## 2014-04-24 ENCOUNTER — Ambulatory Visit (INDEPENDENT_AMBULATORY_CARE_PROVIDER_SITE_OTHER): Payer: No Typology Code available for payment source | Admitting: Gynecology

## 2014-04-24 VITALS — BP 142/88 | Ht 65.0 in | Wt 285.0 lb

## 2014-04-24 DIAGNOSIS — N912 Amenorrhea, unspecified: Secondary | ICD-10-CM

## 2014-04-24 DIAGNOSIS — Z01419 Encounter for gynecological examination (general) (routine) without abnormal findings: Secondary | ICD-10-CM

## 2014-04-24 NOTE — Progress Notes (Signed)
Sally [name] 1961-01-08 [id number]        53 y.o.  [id number] for annual exam.  Several issues noted below.  Past medical history,surgical history, problem list, medications, allergies, family history and social history were all reviewed and documented as reviewed in the EPIC chart.  ROS:  12 system ROS performed with pertinent positives and negatives included in the history, assessment and plan.   Additional significant findings :  none   Exam: [facility] person Vitals:   04/24/14 1138  BP: 142/88  Height: 5\' 5"  (1.651 m)  Weight: 285 lb (129.275 kg)   General appearance:  Normal affect, orientation and appearance. Skin: Grossly normal HEENT: Without gross lesions.  No cervical or supraclavicular adenopathy. Thyroid normal.  Lungs:  Clear without wheezing, rales or rhonchi Cardiac: RR, without RMG Abdominal:  Soft, nontender, without masses, guarding, rebound, organomegaly or hernia Breasts:  Examined lying and sitting without masses, retractions, discharge or axillary adenopathy. Pelvic:  Ext/BUS/vagina normal  Cervix normal  Uterus grossly normal size midline mobile nontender. Exam limited by abdominal girth  Adnexa  Without gross masses or tenderness    Anus and perineum  Normal   Rectovaginal  Normal sphincter tone without palpated masses or tenderness.    Assessment/Plan:  53 y.o. 53 female for annual exam .   1. Amenorrhea/menopausal symptoms. Patient relates last menstrual period was roughly January 2015. Has been no bleeding since then.  Is having some night sweats and hot flashes. Reviewed options to include OTC products such as soy versus HRT. Risk benefits of HRT reviewed to include the WHI study with increased risk of stroke heart attack DVT and breast cancer. ACOG and NAMS statements for lowest dose for shortest period of time reviewed. At this point patient is not interested but will try the OTC products. Patient will call if she does any vaginal bleeding.  Follow up if she wants to rediscuss HRT. 2. Pap/HPV negative 2014. No Pap smear done today. No history of significant abnormal Pap smears previously. 3. Mammography 03/2014. Continue with annual mammography. SBE monthly reviewed. 4. Colonoscopy 2015. Repeat at their recommended interval. 5. Health maintenance. Blood pressure noted 142/88. Recommended recheck in a non-examt situation. Follow up with her primary if continues elevated. No blood work done and she relates this done through her primary physician's office. Follow up in one year, sooner as needed.     04/2014 MD, 12:11 PM 04/24/2014

## 2014-04-24 NOTE — Patient Instructions (Signed)
You may obtain a copy of any labs that were done today by logging onto MyChart as outlined in the instructions provided with your AVS (after visit summary). The office will not call with normal lab results but certainly if there are any significant abnormalities then we will contact you.   Health Maintenance, Female A healthy lifestyle and preventative care can promote health and wellness.  Maintain regular health, dental, and eye exams.  Eat a healthy diet. Foods like vegetables, fruits, whole grains, low-fat dairy products, and lean protein foods contain the nutrients you need without too many calories. Decrease your intake of foods high in solid fats, added sugars, and salt. Get information about a proper diet from your caregiver, if necessary.  Regular physical exercise is one of the most important things you can do for your health. Most adults should get at least 150 minutes of moderate-intensity exercise (any activity that increases your heart rate and causes you to sweat) each week. In addition, most adults need muscle-strengthening exercises on 2 or more days a week.   Maintain a healthy weight. The body mass index (BMI) is a screening tool to identify possible weight problems. It provides an estimate of body fat based on height and weight. Your caregiver can help determine your BMI, and can help you achieve or maintain a healthy weight. For adults 20 years and older:  A BMI below 18.5 is considered underweight.  A BMI of 18.5 to 24.9 is normal.  A BMI of 25 to 29.9 is considered overweight.  A BMI of 30 and above is considered obese.  Maintain normal blood lipids and cholesterol by exercising and minimizing your intake of saturated fat. Eat a balanced diet with plenty of fruits and vegetables. Blood tests for lipids and cholesterol should begin at age 61 and be repeated every 5 years. If your lipid or cholesterol levels are high, you are over 50, or you are a high risk for heart  disease, you may need your cholesterol levels checked more frequently.Ongoing high lipid and cholesterol levels should be treated with medicines if diet and exercise are not effective.  If you smoke, find out from your caregiver how to quit. If you do not use tobacco, do not start.  Lung cancer screening is recommended for adults aged 33 80 years who are at high risk for developing lung cancer because of a history of smoking. Yearly low-dose computed tomography (CT) is recommended for people who have at least a 30-pack-year history of smoking and are a current smoker or have quit within the past 15 years. A pack year of smoking is smoking an average of 1 pack of cigarettes a day for 1 year (for example: 1 pack a day for 30 years or 2 packs a day for 15 years). Yearly screening should continue until the smoker has stopped smoking for at least 15 years. Yearly screening should also be stopped for people who develop a health problem that would prevent them from having lung cancer treatment.  If you are pregnant, do not drink alcohol. If you are breastfeeding, be very cautious about drinking alcohol. If you are not pregnant and choose to drink alcohol, do not exceed 1 drink per day. One drink is considered to be 12 ounces (355 mL) of beer, 5 ounces (148 mL) of wine, or 1.5 ounces (44 mL) of liquor.  Avoid use of street drugs. Do not share needles with anyone. Ask for help if you need support or instructions about stopping  the use of drugs.  High blood pressure causes heart disease and increases the risk of stroke. Blood pressure should be checked at least every 1 to 2 years. Ongoing high blood pressure should be treated with medicines, if weight loss and exercise are not effective.  If you are 54 to 55 years old, ask your caregiver if you should take aspirin to prevent strokes.  Diabetes screening involves taking a blood sample to check your fasting blood sugar level. This should be done once every 3  years, after age 40, if you are within normal weight and without risk factors for diabetes. Testing should be considered at a younger age or be carried out more frequently if you are overweight and have at least 1 risk factor for diabetes.  Breast cancer screening is essential preventative care for women. You should practice "breast self-awareness." This means understanding the normal appearance and feel of your breasts and may include breast self-examination. Any changes detected, no matter how small, should be reported to a caregiver. Women in their 76s and 30s should have a clinical breast exam (CBE) by a caregiver as part of a regular health exam every 1 to 3 years. After age 9, women should have a CBE every year. Starting at age 71, women should consider having a mammogram (breast X-ray) every year. Women who have a family history of breast cancer should talk to their caregiver about genetic screening. Women at a high risk of breast cancer should talk to their caregiver about having an MRI and a mammogram every year.  Breast cancer gene (BRCA)-related cancer risk assessment is recommended for women who have family members with BRCA-related cancers. BRCA-related cancers include breast, ovarian, tubal, and peritoneal cancers. Having family members with these cancers may be associated with an increased risk for harmful changes (mutations) in the breast cancer genes BRCA1 and BRCA2. Results of the assessment will determine the need for genetic counseling and BRCA1 and BRCA2 testing.  The Pap test is a screening test for cervical cancer. Women should have a Pap test starting at age 30. Between ages 83 and 55, Pap tests should be repeated every 2 years. Beginning at age 70, you should have a Pap test every 3 years as long as the past 3 Pap tests have been normal. If you had a hysterectomy for a problem that was not cancer or a condition that could lead to cancer, then you no longer need Pap tests. If you are  between ages 69 and 75, and you have had normal Pap tests going back 10 years, you no longer need Pap tests. If you have had past treatment for cervical cancer or a condition that could lead to cancer, you need Pap tests and screening for cancer for at least 20 years after your treatment. If Pap tests have been discontinued, risk factors (such as a new sexual partner) need to be reassessed to determine if screening should be resumed. Some women have medical problems that increase the chance of getting cervical cancer. In these cases, your caregiver may recommend more frequent screening and Pap tests.  The human papillomavirus (HPV) test is an additional test that may be used for cervical cancer screening. The HPV test looks for the virus that can cause the cell changes on the cervix. The cells collected during the Pap test can be tested for HPV. The HPV test could be used to screen women aged 43 years and older, and should be used in women of any age  who have unclear Pap test results. After the age of 43, women should have HPV testing at the same frequency as a Pap test.  Colorectal cancer can be detected and often prevented. Most routine colorectal cancer screening begins at the age of 99 and continues through age 69. However, your caregiver may recommend screening at an earlier age if you have risk factors for colon cancer. On a yearly basis, your caregiver may provide home test kits to check for hidden blood in the stool. Use of a small camera at the end of a tube, to directly examine the colon (sigmoidoscopy or colonoscopy), can detect the earliest forms of colorectal cancer. Talk to your caregiver about this at age 39, when routine screening begins. Direct examination of the colon should be repeated every 5 to 10 years through age 53, unless early forms of pre-cancerous polyps or small growths are found.  Hepatitis C blood testing is recommended for all people born from [birth year] through 1967 and any  individual with known risks for hepatitis C.  Practice safe sex. Use condoms and avoid high-risk sexual practices to reduce the spread of sexually transmitted infections (STIs). Sexually active women aged 69 and younger should be checked for Chlamydia, which is a common sexually transmitted infection. Older women with new or multiple partners should also be tested for Chlamydia. Testing for other STIs is recommended if you are sexually active and at increased risk.  Osteoporosis is a disease in which the bones lose minerals and strength with aging. This can result in serious bone fractures. The risk of osteoporosis can be identified using a bone density scan. Women ages 63 and over and women at risk for fractures or osteoporosis should discuss screening with their caregivers. Ask your caregiver whether you should be taking a calcium supplement or vitamin D to reduce the rate of osteoporosis.  Menopause can be associated with physical symptoms and risks. Hormone replacement therapy is available to decrease symptoms and risks. You should talk to your caregiver about whether hormone replacement therapy is right for you.  Use sunscreen. Apply sunscreen liberally and repeatedly throughout the day. You should seek shade when your shadow is shorter than you. Protect yourself by wearing long sleeves, pants, a wide-brimmed hat, and sunglasses year round, whenever you are outdoors.  Notify your caregiver of new moles or changes in moles, especially if there is a change in shape or color. Also notify your caregiver if a mole is larger than the size of a pencil eraser.  Stay current with your immunizations. Document Released: 01/30/2011 Document Revised: 11/11/2012 Document Reviewed: 01/30/2011 [facility] Patient Information 2014 [place].

## 2014-04-25 LAB — URINALYSIS W MICROSCOPIC + REFLEX CULTURE
BACTERIA UA: NONE SEEN
BILIRUBIN URINE: NEGATIVE
CASTS: NONE SEEN
CRYSTALS: NONE SEEN
GLUCOSE, UA: NEGATIVE mg/dL
Hgb urine dipstick: NEGATIVE
KETONES UR: NEGATIVE mg/dL
Leukocytes, UA: NEGATIVE
Nitrite: NEGATIVE
PH: 7 (ref 5.0–8.0)
Protein, ur: NEGATIVE mg/dL
SPECIFIC GRAVITY, URINE: 1.023 (ref 1.005–1.030)
Urobilinogen, UA: 0.2 mg/dL (ref 0.0–1.0)

## 2014-04-28 ENCOUNTER — Encounter: Payer: Self-pay | Admitting: Gynecology

## 2014-06-01 ENCOUNTER — Encounter: Payer: Self-pay | Admitting: Gynecology

## 2015-08-19 ENCOUNTER — Encounter: Payer: Self-pay | Admitting: Gynecology

## 2015-09-17 ENCOUNTER — Encounter: Payer: No Typology Code available for payment source | Admitting: Gynecology

## 2015-10-20 ENCOUNTER — Encounter: Payer: Self-pay | Admitting: Gynecology

## 2015-10-20 ENCOUNTER — Ambulatory Visit (INDEPENDENT_AMBULATORY_CARE_PROVIDER_SITE_OTHER): Payer: BLUE CROSS/BLUE SHIELD | Admitting: Gynecology

## 2015-10-20 VITALS — BP 126/80 | Ht 64.0 in | Wt 295.0 lb

## 2015-10-20 DIAGNOSIS — N951 Menopausal and female climacteric states: Secondary | ICD-10-CM

## 2015-10-20 DIAGNOSIS — Z01419 Encounter for gynecological examination (general) (routine) without abnormal findings: Secondary | ICD-10-CM

## 2015-10-20 NOTE — Progress Notes (Signed)
    Sally [name] [birth year] [id number]        54 y.o.  [id number]  for annual exam.  Doing well. Several issues noted below.  Past medical history,surgical history, problem list, medications, allergies, family history and social history were all reviewed and documented as reviewed in the EPIC chart.  ROS:  Performed with pertinent positives and negatives included in the history, assessment and plan.   Additional significant findings :  none   Exam: [name] assistant Filed Vitals:   10/20/15 1134  BP: 126/80  Height: 5\' 4"  (1.626 m)  Weight: 295 lb (133.811 kg)   General appearance:  Normal affect, orientation and appearance. Skin: Grossly normal HEENT: Without gross lesions.  No cervical or supraclavicular adenopathy. Thyroid normal.  Lungs:  Clear without wheezing, rales or rhonchi Cardiac: RR, without RMG Abdominal:  Soft, nontender, without masses, guarding, rebound, organomegaly or hernia Breasts:  Examined lying and sitting without masses, retractions, discharge or axillary adenopathy. Pelvic:  Ext/BUS/vagina normal  Cervix normal  Uterus difficult to palpate but no gross masses or tenderness  Adnexa without gross masses or tenderness    Anus and perineum normal   Rectovaginal normal sphincter tone without palpated masses or tenderness.    Assessment/Plan:  55 y.o. 57 female for annual exam.   1. Postmenopausal/atrophic genital changes. Having some hot flushes and sweats. No vaginal bleeding. No vaginal dryness. Reviewed options to include OTC products up to and including HRT. Risks benefits, quality-of-life issues reviewed. Patient does not feel her symptoms warrant HRT. Will try OTC products first. Will follow up if she wants to rediscuss HRT. Risks reviewed to include from bonuses such as stroke heart attack DVT and breast cancer. 2. Mammography 08/2015. Continue with annual mammography when due. SBE monthly reviewed. 3. Pap smear/HPV 07/2012 negative.  No  history of abnormal Pap smears previously. 4. Colonoscopy reported 2014. Repeat at their recommended interval.  5. Health maintenance. No routine lab work done as patient reports this done elsewhere. All one year, sooner as needed.   2015 MD, 12:03 PM 10/20/2015

## 2015-10-20 NOTE — Patient Instructions (Signed)

## 2016-06-26 ENCOUNTER — Emergency Department (HOSPITAL_COMMUNITY): Payer: Medicare HMO

## 2016-06-26 ENCOUNTER — Encounter (HOSPITAL_COMMUNITY): Payer: Self-pay | Admitting: *Deleted

## 2016-06-26 ENCOUNTER — Emergency Department (HOSPITAL_BASED_OUTPATIENT_CLINIC_OR_DEPARTMENT_OTHER): Admit: 2016-06-26 | Discharge: 2016-06-26 | Disposition: A | Discharge status: Home / Self Care | Payer: Medicare HMO

## 2016-06-26 ENCOUNTER — Emergency Department (HOSPITAL_COMMUNITY)
Admission: EM | Admit: 2016-06-26 | Discharge: 2016-06-26 | Disposition: A | Discharge status: Home / Self Care | Payer: Medicare HMO | Attending: Emergency Medicine | Admitting: Emergency Medicine

## 2016-06-26 DIAGNOSIS — M79605 Pain in left leg: Secondary | ICD-10-CM | POA: Diagnosis not present

## 2016-06-26 DIAGNOSIS — Z87891 Personal history of nicotine dependence: Secondary | ICD-10-CM | POA: Diagnosis not present

## 2016-06-26 DIAGNOSIS — M7989 Other specified soft tissue disorders: Secondary | ICD-10-CM | POA: Diagnosis not present

## 2016-06-26 DIAGNOSIS — J45909 Unspecified asthma, uncomplicated: Secondary | ICD-10-CM | POA: Diagnosis not present

## 2016-06-26 DIAGNOSIS — R0789 Other chest pain: Secondary | ICD-10-CM

## 2016-06-26 HISTORY — DX: Other pulmonary embolism without acute cor pulmonale: I26.99

## 2016-06-26 LAB — I-STAT TROPONIN, ED: Troponin i, poc: 0 ng/mL (ref 0.00–0.08)

## 2016-06-26 LAB — TROPONIN I

## 2016-06-26 LAB — BASIC METABOLIC PANEL
Anion gap: 9 (ref 5–15)
BUN: 12 mg/dL (ref 6–20)
CHLORIDE: 101 mmol/L (ref 101–111)
CO2: 25 mmol/L (ref 22–32)
Calcium: 8.7 mg/dL — ABNORMAL LOW (ref 8.9–10.3)
Creatinine, Ser: 0.74 mg/dL (ref 0.44–1.00)
GFR calc Af Amer: >60 mL/min (ref >60)
GFR calc non Af Amer: >60 mL/min (ref >60)
GLUCOSE: 95 mg/dL (ref 65–99)
POTASSIUM: 3.6 mmol/L (ref 3.5–5.1)
SODIUM: 135 mmol/L (ref 135–145)

## 2016-06-26 LAB — CBC
HEMATOCRIT: 36.8 % (ref 36.0–46.0)
Hemoglobin: 12 g/dL (ref 12.0–15.0)
MCH: 28.4 pg (ref 26.0–34.0)
MCHC: 32.6 g/dL (ref 30.0–36.0)
MCV: 87 fL (ref 78.0–100.0)
Platelets: 399 10*3/uL (ref 150–400)
RBC: 4.23 MIL/uL (ref 3.87–5.11)
RDW: 14.2 % (ref 11.5–15.5)
WBC: 9.2 10*3/uL (ref 4.0–10.5)

## 2016-06-26 NOTE — Discharge Instructions (Signed)
As discussed, your evaluation today has been largely reassuring.  But, it is important that you monitor your condition carefully, and do not hesitate to return to the ED if you develop new, or concerning changes in your condition. ? ?Otherwise, please follow-up with your physician for appropriate ongoing care. ? ?

## 2016-06-26 NOTE — ED Triage Notes (Signed)
PT told to come there by per pcp for L LE swelling and pain.  Also c/o sob and chest pain.

## 2016-06-26 NOTE — Progress Notes (Signed)
*  PRELIMINARY RESULTS* Vascular Ultrasound Left lower extremity venous duplex has been completed.  Preliminary findings: No evidence of DVT or baker's cyst.  Farrel Demark, RDMS, RVT  06/26/2016, 6:57 PM

## 2016-06-26 NOTE — ED Provider Notes (Signed)
[facility]-EMERGENCY DEPT Provider Note   CSN: [id number] Arrival date & time: 06/26/16  1809     History   Chief Complaint Chief Complaint  Patient presents with  . Chest Pain  . Leg Pain    HPI Sally [name] is a 55 y.o. female.  HPI  Patient presents with concern of left lower extremity swelling, pain, as well as left-sided chest pain. Patient gives a variable history, but it seems as though she has frequent episodes of bilateral low extremity swelling, typically left greater than right. Over the past 2 or 3 days she has had more pronounced swelling in usual in the left lower extremity. She acknowledges a history of pulmonary embolism in the distant past, is not currently taking any anticoagulants. Today, with concern for persistent left lower extremity swelling, pain, she spoke with her physician, was referred here for evaluation. She notes that with her left lower extremity pain she has had 2 episodes of brief, sharp, nonradiating upper left chest pain, as well as continued ongoing episodic dyspnea.   Past Medical History:  Diagnosis Date  . Anemia   . Arthritis   . Asthma   . Colitis   . Fibromyalgia   . IBS (irritable bowel syndrome)   . Pulmonary embolism (HCC)   . RLS (restless legs syndrome)     Patient Active Problem List   Diagnosis Date Noted  . Colitis   . RLS (restless legs syndrome)   . IBS (irritable bowel syndrome)     Past Surgical History:  Procedure Laterality Date  . EYE SURGERY    . MOUTH SURGERY      OB History    Gravida Para Term Preterm AB Living   6 5 5   1 5    SAB TAB Ectopic Multiple Live Births                   Home Medications    Prior to Admission medications   Medication Sig Start Date End Date Taking? Authorizing Provider  albuterol (PROVENTIL HFA;VENTOLIN HFA) 108 (90 BASE) MCG/ACT inhaler Inhale 2 puffs into the lungs every 6 (six) hours as needed. FOR WHEEZING     Historical Provider, MD  DULoxetine (CYMBALTA) 30  MG capsule Take 30 mg by mouth 3 (three) times daily.    Historical Provider, MD  HYDROcodone-acetaminophen (NORCO/VICODIN) 5-325 MG tablet Take 1 tablet by mouth every 6 (six) hours as needed for moderate pain.    Historical Provider, MD  MELOXICAM PO Take by mouth.    Historical Provider, MD  metFORMIN (GLUCOPHAGE) 500 MG tablet Take by mouth daily.    Historical Provider, MD  Multiple Vitamin (MULTIVITAMIN) tablet Take 1 tablet by mouth daily.    Historical Provider, MD  Pregabalin (LYRICA PO) Take by mouth.    Historical Provider, MD    Family History Family History  Problem Relation Age of Onset  . Pneumonia Mother   . Asthma Mother   . Cancer Father     Brain tumor  . Asthma Father     Social History Social History  Substance Use Topics  . Smoking status: Former  . Smokeless tobacco: Never Used  . Alcohol use No     Allergies   Patient has no known allergies.   Review of Systems Review of Systems  Constitutional:       Per HPI, otherwise negative  HENT:       Per HPI, otherwise negative  Respiratory:  Per HPI, otherwise negative  Cardiovascular:       Per HPI, otherwise negative  Gastrointestinal: Negative for vomiting.  Endocrine:       Negative aside from HPI  Genitourinary:       Neg aside from HPI   Musculoskeletal:       Per HPI, otherwise negative  Skin: Negative.   Allergic/Immunologic:       Fibromyalgia  Neurological: Negative for syncope.     Physical Exam Updated Vital Signs BP 140/81 (BP Location: Right Leg)   Pulse 78   Temp 98.2 F (36.8 C) (Oral)   Resp 16   Ht 5' 4.5" (1.638 m)   Wt 290 lb (131.5 kg)   SpO2 100%   BMI 49.01 kg/m   Physical Exam  Constitutional: She is oriented to person, place, and time. She appears well-developed and well-nourished. No distress.  HENT:  Head: Normocephalic and atraumatic.  Eyes: Conjunctivae and EOM are normal.  Cardiovascular: Normal rate and regular rhythm.     Pulmonary/Chest: Effort normal and breath sounds normal. No stridor. No respiratory distress.  Abdominal: She exhibits no distension.  Musculoskeletal: She exhibits no edema.  LLE >R LE w no distension, no discoloration, no TTP  Neurological: She is alert and oriented to person, place, and time. No cranial nerve deficit.  Skin: Skin is warm and dry.  Psychiatric: She has a normal mood and affect.  Nursing note and vitals reviewed.    ED Treatments / Results  Labs (all labs ordered are listed, but only abnormal results are displayed) Labs Reviewed  BASIC METABOLIC PANEL - Abnormal; Notable for the following:       Result Value   Calcium 8.7 (*)    All other components within normal limits  CBC  TROPONIN I  I-STAT TROPOININ, ED    EKG  EKG Interpretation  Date/Time:  Monday June 26 2016 18:20:41 EST Ventricular Rate:  81 PR Interval:  190 QRS Duration: 72 QT Interval:  396 QTC Calculation: 460 R Axis:   7 Text Interpretation:  Normal sinus rhythm Cannot rule out Anterior infarct , age undetermined Borderline ECG Confirmed by Gerhard Munch  MD [phone]) on 06/26/2016 8:54:45 PM       Radiology Dg Chest 2 View  Result Date: 06/26/2016 CLINICAL DATA:  Pt complains of swelling of left leg, chest pain (anterior side around left breast area), and SOB. LMP checked; pt shielded. Medical hx: asthma (uses inhaler). EXAM: CHEST  2 VIEW COMPARISON:  01/09/2016, 01/08/2016 FINDINGS: Heart size is normal. Lungs are free of focal consolidations and pleural effusions. No pulmonary edema. IMPRESSION: No evidence for acute  abnormality. Electronically Signed   By: Norva Pavlov M.D.   On: 06/26/2016 19:17    Procedures Procedures (including critical care time)  Initial Impression / Assessment and Plan / ED Course  I have reviewed the triage vital signs and the nursing notes.  Pertinent labs & imaging results that were available during my care of the patient were reviewed by  me and considered in my medical decision making (see chart for details).  Clinical Course     11:00 PM On repeat exam the patient is awake and alert, appears comfortable. Vital signs remained unremarkable. I discussed all findings with her and her family member. We discussed the 2 negative troponins, the unremarkable lower extremity ultrasound, the reassuring labs. We discussed the importance of following up with primary care given today's reassuring results, which do not support ongoing coronary ischemia,  blood clot, pneumonia, other acute new pathology.   Final Clinical Impressions(s) / ED Diagnoses   Final diagnoses:  Atypical chest pain  Left leg pain     Gerhard Munch, MD 06/26/16 2348

## 2016-06-26 NOTE — ED Notes (Signed)
Patient denies pain and is resting comfortably.  

## 2016-10-17 ENCOUNTER — Encounter: Payer: Self-pay | Admitting: Gynecology

## 2016-11-02 ENCOUNTER — Encounter: Payer: BLUE CROSS/BLUE SHIELD | Admitting: Gynecology

## 2016-11-06 ENCOUNTER — Encounter: Payer: BLUE CROSS/BLUE SHIELD | Admitting: Gynecology

## 2016-11-28 ENCOUNTER — Ambulatory Visit (INDEPENDENT_AMBULATORY_CARE_PROVIDER_SITE_OTHER): Payer: Medicare PPO | Admitting: Gynecology

## 2016-11-28 ENCOUNTER — Encounter: Payer: Self-pay | Admitting: Gynecology

## 2016-11-28 VITALS — BP 140/86 | Ht 64.0 in | Wt 275.0 lb

## 2016-11-28 DIAGNOSIS — Z01419 Encounter for gynecological examination (general) (routine) without abnormal findings: Secondary | ICD-10-CM

## 2016-11-28 DIAGNOSIS — Z1151 Encounter for screening for human papillomavirus (HPV): Secondary | ICD-10-CM | POA: Diagnosis not present

## 2016-11-28 DIAGNOSIS — R1032 Left lower quadrant pain: Secondary | ICD-10-CM | POA: Diagnosis not present

## 2016-11-28 DIAGNOSIS — N952 Postmenopausal atrophic vaginitis: Secondary | ICD-10-CM | POA: Diagnosis not present

## 2016-11-28 NOTE — Progress Notes (Signed)
    Sally [name] 1961-03-31 [id number]        56 y.o.  [id number] for breast and pelvic exam.  Also notes some left lower abdominal discomfort, fleeting in nature that comes occasionally. No nausea vomiting diarrhea constipation. Is not exacerbated with activity. Does not appear to be getting worse. Has noticed of the last several years. No vaginal bleeding or urinary symptoms such as frequency dysuria or urgency.  Past medical history,surgical history, problem list, medications, allergies, family history and social history were all reviewed and documented as reviewed in the EPIC chart.  ROS:  Performed with pertinent positives and negatives included in the history, assessment and plan.   Additional significant findings :  None   Exam: [name] assistant Vitals:   11/28/16 1607  BP: 140/86  Weight: 275 lb (124.7 kg)  Height: 5\' 4"  (1.626 m)   Body mass index is 47.2 kg/m.  General appearance:  Normal affect, orientation and appearance. Skin: Grossly normal HEENT: Without gross lesions.  No cervical or supraclavicular adenopathy. Thyroid normal.  Lungs:  Clear without wheezing, rales or rhonchi Cardiac: RR, without RMG Abdominal:  Soft, nontender, without masses, guarding, rebound, organomegaly or hernia. No visual or palpable abnormalities in the left lower quadrant or groin region. Breasts:  Examined lying and sitting without masses, retractions, discharge or axillary adenopathy. Pelvic:  Ext, BUS, Vagina: With mild atrophic changes  Cervix: Mild atrophic changes. Pap smear and HPV done  Uterus: Grossly normal size midline mobile nontender  Adnexa: Without gross masses or tenderness    Anus and perineum: Normal   Rectovaginal: Normal sphincter tone without palpated masses or tenderness.    Assessment/Plan:  56 y.o. 56 female for breast and pelvic exam.   1. Post menopausal/atrophic genital changes. No significant hot flushes, night sweats, vaginal dryness or any  vaginal bleeding. Continue to monitor report any issues or bleeding. 2. Left lower abdominal discomfort. Present for years fleeting in character. Does not appear to be getting worse. No associated findings to suggest GI or GU. No vaginal bleeding or pelvic cramping to suggest GYN. During exam she points more into the groin/hip area. Reviewed with patient could be orthopedic related to her hip joint. Options for management were discussed to include observation as it does not appear to be overly bothersome to the patient, has been present for years and does not seem to be getting worse. Alternatives to include further studies up to and including CT scanning discussed. Patient does not feel wants further investigation at this time. If it would change or she would desire further evaluation she'll follow up with her primary physician. 3. Mammography 09/2016. Continue with annual mammography when due. SBE monthly reviewed. 4. Pap smear/HPV 07/2012. Pap smear/HPV done today. No history of significant abnormal Pap smears previously. 5. Colonoscopy 2014. Repeat at their recommended interval. 6. Health maintenance. No routine lab work done as patient recently had this done at her primary physician's office. Blood pressure today running elevated at 140/86. Discussed with patient. She has an appointment tomorrow to see her primary is going to have it rechecked and followed up at that office. Follow up in one year, sooner as needed.  Additional time in excess of her breast and pelvic exam was spent in direct face to face counseling and coordination of care in regards to her left lower quadrant/hip pain.    2015 MD, 4:34 PM 11/28/2016

## 2016-11-28 NOTE — Addendum Note (Signed)
Addended by: Dayna Barker on: 11/28/2016 04:45 PM   Modules accepted: Orders

## 2016-11-28 NOTE — Patient Instructions (Signed)

## 2016-11-29 LAB — URINALYSIS W MICROSCOPIC + REFLEX CULTURE
BACTERIA UA: NONE SEEN [HPF]
BILIRUBIN URINE: NEGATIVE
Casts: NONE SEEN [LPF]
Crystals: NONE SEEN [HPF]
GLUCOSE, UA: NEGATIVE
Hgb urine dipstick: NEGATIVE
Ketones, ur: NEGATIVE
LEUKOCYTES UA: NEGATIVE
Nitrite: NEGATIVE
PROTEIN: NEGATIVE
RBC / HPF: NONE SEEN RBC/HPF (ref <=2)
SPECIFIC GRAVITY, URINE: 1.023 (ref 1.001–1.035)
WBC UA: NONE SEEN WBC/HPF (ref <=5)
YEAST: NONE SEEN [HPF]
pH: 5.5 (ref 5.0–8.0)

## 2016-12-01 LAB — PAP IG AND HPV HIGH-RISK: HPV DNA HIGH RISK: NOT DETECTED

## 2017-11-05 ENCOUNTER — Encounter (HOSPITAL_COMMUNITY): Payer: Self-pay | Admitting: Emergency Medicine

## 2017-11-05 ENCOUNTER — Other Ambulatory Visit: Payer: Self-pay

## 2017-11-05 ENCOUNTER — Emergency Department (HOSPITAL_COMMUNITY)
Admission: EM | Admit: 2017-11-05 | Discharge: 2017-11-06 | Disposition: A | Discharge status: Home / Self Care | Payer: Medicare HMO | Attending: Emergency Medicine | Admitting: Emergency Medicine

## 2017-11-05 DIAGNOSIS — Z7984 Long term (current) use of oral hypoglycemic drugs: Secondary | ICD-10-CM | POA: Insufficient documentation

## 2017-11-05 DIAGNOSIS — J45909 Unspecified asthma, uncomplicated: Secondary | ICD-10-CM | POA: Diagnosis not present

## 2017-11-05 DIAGNOSIS — R2242 Localized swelling, mass and lump, left lower limb: Secondary | ICD-10-CM | POA: Diagnosis present

## 2017-11-05 DIAGNOSIS — Z79899 Other long term (current) drug therapy: Secondary | ICD-10-CM | POA: Insufficient documentation

## 2017-11-05 DIAGNOSIS — Z87891 Personal history of nicotine dependence: Secondary | ICD-10-CM | POA: Insufficient documentation

## 2017-11-05 DIAGNOSIS — M7989 Other specified soft tissue disorders: Secondary | ICD-10-CM

## 2017-11-05 NOTE — ED Triage Notes (Signed)
Pt fell two weeks ago and injured her left leg, the pain has not gotten any better, the lower left leg started swelling two days later.  Pt reports she also started new medication around the same time so "I'm not sure why its is swelling.  Leg is normal temperature, good pulses, good cap refill, +2 pitting edema.

## 2017-11-06 ENCOUNTER — Ambulatory Visit (HOSPITAL_BASED_OUTPATIENT_CLINIC_OR_DEPARTMENT_OTHER)
Admission: RE | Admit: 2017-11-06 | Discharge: 2017-11-06 | Disposition: A | Discharge status: Home / Self Care | Payer: Medicare HMO | Source: Ambulatory Visit | Attending: Emergency Medicine | Admitting: Emergency Medicine

## 2017-11-06 ENCOUNTER — Emergency Department (HOSPITAL_COMMUNITY): Payer: Medicare HMO

## 2017-11-06 DIAGNOSIS — M7989 Other specified soft tissue disorders: Secondary | ICD-10-CM | POA: Diagnosis not present

## 2017-11-06 LAB — CBC WITH DIFFERENTIAL/PLATELET
BASOS ABS: 0.1 10*3/uL (ref 0.0–0.1)
BASOS PCT: 1 %
Eosinophils Absolute: 0.2 10*3/uL (ref 0.0–0.7)
Eosinophils Relative: 2 %
HEMATOCRIT: 38.7 % (ref 36.0–46.0)
HEMOGLOBIN: 12.5 g/dL (ref 12.0–15.0)
LYMPHS PCT: 31 %
Lymphs Abs: 3.1 10*3/uL (ref 0.7–4.0)
MCH: 29 pg (ref 26.0–34.0)
MCHC: 32.3 g/dL (ref 30.0–36.0)
MCV: 89.8 fL (ref 78.0–100.0)
MONO ABS: 0.6 10*3/uL (ref 0.1–1.0)
Monocytes Relative: 6 %
NEUTROS ABS: 6.2 10*3/uL (ref 1.7–7.7)
NEUTROS PCT: 60 %
Platelets: 380 10*3/uL (ref 150–400)
RBC: 4.31 MIL/uL (ref 3.87–5.11)
RDW: 14.8 % (ref 11.5–15.5)
WBC: 10.1 10*3/uL (ref 4.0–10.5)

## 2017-11-06 LAB — BASIC METABOLIC PANEL
ANION GAP: 8 (ref 5–15)
BUN: 16 mg/dL (ref 6–20)
CHLORIDE: 102 mmol/L (ref 101–111)
CO2: 26 mmol/L (ref 22–32)
Calcium: 9.2 mg/dL (ref 8.9–10.3)
Creatinine, Ser: 0.73 mg/dL (ref 0.44–1.00)
GFR calc non Af Amer: >60 mL/min (ref >60)
GLUCOSE: 130 mg/dL — AB (ref 65–99)
POTASSIUM: 4.1 mmol/L (ref 3.5–5.1)
Sodium: 136 mmol/L (ref 135–145)

## 2017-11-06 MED ORDER — ENOXAPARIN SODIUM 150 MG/ML ~~LOC~~ SOLN
1.0000 mg/kg | Freq: Once | SUBCUTANEOUS | Status: AC
  Administered 2017-11-06: 130 mg via SUBCUTANEOUS
  Filled 2017-11-06: qty 0.86

## 2017-11-06 NOTE — ED Provider Notes (Signed)
[facility] EMERGENCY DEPARTMENT Provider Note   CSN: [id number] Arrival date & time: 11/05/17  1925     History   Chief Complaint Chief Complaint  Patient presents with  . leg swelling from fall    HPI Sally [name] is a 57 y.o. female.  The history is provided by the patient and medical records.    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  Patient reports she tripped and fell a few weeks ago and injured her left shin.  States since then she has noticed some progressively increasing edema of the left lower leg.  States she does have a lot of pain behind the left knee and into the calf.  She denies any numbness or weakness of the left leg.  States it does feel "tight" when she walks.  States she was also started on MTX for her RA around the same time the swelling started.  She did talk to her doctor about this and they felt like since swelling was unilateral, it was not from the medication.  She denies any chest pain, SOB, fever, chills, cough, etc.  Reports hx of DVT and PE in the past-- states she was told it was after a horrible case of pneumonia several years ago.  She took anticoagulants for about 6 months.  Past Medical History:  Diagnosis Date  . Anemia   . Arthritis   . Asthma   . Colitis   . Fibromyalgia   . IBS (irritable bowel syndrome)   . Pulmonary embolism (HCC)   . RLS (restless legs syndrome)     Patient Active Problem List   Diagnosis Date Noted  . Colitis   . RLS (restless legs syndrome)   . IBS (irritable bowel syndrome)     Past Surgical History:  Procedure Laterality Date  . EYE SURGERY    . MOUTH SURGERY       OB History    Gravida  6   Para  5   Term  5   Preterm      AB  1   Living  5     SAB      TAB      Ectopic      Multiple      Live Births                Home Medications    Prior to Admission medications   Medication Sig Start Date End Date Taking? Authorizing Provider  albuterol (PROVENTIL HFA;VENTOLIN HFA) 108 (90 BASE) MCG/ACT inhaler Inhale 2 puffs into the lungs every 6 (six) hours as needed. FOR WHEEZING     [provider]  DULoxetine (CYMBALTA) 30 MG capsule Take 30 mg by mouth 3 (three) times daily.    [provider]  GABAPENTIN PO Take by mouth.    [provider]  MELOXICAM PO Take by mouth.    [provider]  metFORMIN (GLUCOPHAGE) 500 MG tablet Take by mouth daily.    [provider]  montelukast (SINGULAIR) 10 MG tablet Take 10 mg by mouth at bedtime.    [provider]  Multiple Vitamin (MULTIVITAMIN) tablet Take 1 tablet by mouth daily.    [provider]  phentermine 37.5 MG capsule Take 37.5 mg by mouth every morning.    [provider]  TRAMADOL HCL  PO Take by mouth.    [provider]    Family History Family History  Problem Relation Age of Onset  . Pneumonia Mother   . Asthma Mother   . Cancer Father        Brain tumor  . Asthma Father     Social History Social History   Tobacco Use  . Smoking status: Former Games developer  . Smokeless tobacco: Never Used  Substance Use Topics  . Alcohol use: No    Alcohol/week: 0.0 oz  . Drug use: No     Allergies   Patient has no known allergies.   Review of Systems Review of Systems  Cardiovascular: Positive for leg swelling.  All other systems reviewed and are negative.    Physical Exam Updated Vital Signs BP (!) 161/89 (BP Location: Left Arm)   Pulse 81   Temp 98 F (36.7 C) (Oral)   Resp 16   LMP 05/31/2012   SpO2 100%   Physical Exam  Constitutional: She is oriented to person, place, and time. She appears well-developed and well-nourished.  HENT:  Head: Normocephalic and atraumatic.  Mouth/Throat: Oropharynx is clear and moist.  Eyes: Pupils are equal, round,  and reactive to light. Conjunctivae and EOM are normal.  Neck: Normal range of motion.  Cardiovascular: Normal rate, regular rhythm and normal heart sounds.  Pulmonary/Chest: Effort normal and breath sounds normal.  Abdominal: Soft. Bowel sounds are normal.  Musculoskeletal: Normal range of motion.  Left leg swollen from knee down to foot, some pitting; there are no overlying skin changes, induration, or warmth to touch; no palpable cords; tenderness along the anterior shin and popliteal area; DP pulse intact; no rashes or open wounds Right leg normal  Neurological: She is alert and oriented to person, place, and time.  Skin: Skin is warm and dry.  Psychiatric: She has a normal mood and affect.  Nursing note and vitals reviewed.    ED Treatments / Results  Labs (all labs ordered are listed, but only abnormal results are displayed) Labs Reviewed  BASIC METABOLIC PANEL - Abnormal; Notable for the following components:      Result Value   Glucose, Bld 130 (*)    All other components within normal limits  CBC WITH DIFFERENTIAL/PLATELET    EKG None  Radiology Dg Tibia/fibula Left  Result Date: 11/06/2017 CLINICAL DATA:  Leg swelling x2 weeks after fall. EXAM: LEFT TIBIA AND FIBULA - 2 VIEW COMPARISON:  02/20/2017 knee radiographs FINDINGS: Redemonstration of tricompartmental osteoarthritis of the left knee as well as osteoarthritis of the midfoot. Plantar calcaneal enthesophytes are noted. Diffuse mild soft tissue induration is seen of the left leg without acute fracture, suspicious osseous lesion or joint dislocation. IMPRESSION: 1. Mild diffuse soft tissue induration and swelling of the left leg without acute fracture nor joint dislocation. 2. Osteoarthritis of the knee and midfoot as above. 3. Calcaneal enthesopathy. Electronically Signed   By: Tollie Eth M.D.   On: 11/06/2017 03:28    Procedures Procedures (including critical care time)  Medications Ordered in ED Medications   enoxaparin (LOVENOX) injection 130 mg (has no administration in time range)     Initial Impression / Assessment and Plan / ED Course  I have reviewed the triage vital signs and the nursing notes.  Pertinent labs & imaging results that were available during my care of the patient were reviewed by me and considered in my medical decision making (see chart for details).  57 y.o. F here  with left lower leg pain/swelling.  States she fell a few weeks ago but never got evaluated.  States swelling has progressively worsened.  States a lot of pain and tightness in her leg, particularly behind the knee and the calf.  She does have history of DVT and PE in the past.  She denies any current chest pain, shortness of breath, fever, chills, etc.  Recent started on MTX-- spoke with her doctor about this, felt unlikely medication SE as this is unilateral.  On exam she does have swelling of the left leg from the knee down to the foot with some pitting.  There are no overlying skin changes, specifically no erythema, induration, warmth to touch.  No palpable cords.  Her leg is neurovascularly intact.  She does not have any open wounds or sores.  Right leg appears normal.  Screening labs are reassuring.  X-ray with some soft tissue swelling without acute bony findings.  Given her history of DVT and PE as well as current symptoms without findings to suggest acute infection (no fever, normal WBC count, no clinical signs of cellulitis on exam), still have clinical concern for DVT.  Patient will be given lovenox injection here, will return in the morning for venous duplex as this study unavailable at this hour.  Explained that any abnormal results will be handled by ED staff, if duplex is normal she is to follow-up closely with her primary care doctor.  She will return here for any new/acute changes.  Final Clinical Impressions(s) / ED Diagnoses   Final diagnoses:  Left leg swelling    ED Discharge Orders         Ordered    VAS [name] LOWER EXTREMITY VENOUS (DVT)     11/06/17 0348       Garlon Hatchet, PA-C 11/06/17 [id number]    Palumbo, April, MD 11/06/17 [id number]

## 2017-11-06 NOTE — ED Notes (Signed)
ED Provider at bedside. 

## 2017-11-06 NOTE — Discharge Instructions (Signed)
You have been scheduled for venous duplex of your leg in the morning to evaluate for DVT.  You may come anytime between 7am-7PM (I do recommend coming in before 6:30PM).   DO NOT CHECK BACK INTO THE ED-- GO TO THE IMAGING DEPARTMENT.  If you need to come in through the ED they can direct you there.   If any abnormal findings, ED staff will address these-- otherwise you can follow-up with your primary care doctor. Return to the ED for new or worsening symptoms.

## 2017-11-06 NOTE — ED Notes (Signed)
Patient transported to X-ray 

## 2017-11-06 NOTE — Progress Notes (Signed)
  LLE venous duplex prelim: negative for DVT. Farrel Demark, RDMS, RVT   Patient arrived to department at 3:00 stating she was told in ED that she could come anytime between 7am and 7pm for vascular ultrasound. We informed her that this is not the protocol and that there is a scheduled time for 8am for patients coming back from overnight ED. Explained there would be a wait, but we could try to work her in, since she was misinformed.

## 2017-12-28 ENCOUNTER — Encounter: Payer: Self-pay | Admitting: Gynecology

## 2017-12-28 ENCOUNTER — Ambulatory Visit (INDEPENDENT_AMBULATORY_CARE_PROVIDER_SITE_OTHER): Payer: Medicare HMO | Admitting: Gynecology

## 2017-12-28 VITALS — BP 128/84 | Ht 64.0 in | Wt 292.0 lb

## 2017-12-28 DIAGNOSIS — Z8739 Personal history of other diseases of the musculoskeletal system and connective tissue: Secondary | ICD-10-CM

## 2017-12-28 DIAGNOSIS — Z01419 Encounter for gynecological examination (general) (routine) without abnormal findings: Secondary | ICD-10-CM

## 2017-12-28 DIAGNOSIS — N952 Postmenopausal atrophic vaginitis: Secondary | ICD-10-CM | POA: Diagnosis not present

## 2017-12-28 NOTE — Progress Notes (Signed)
    Sally [name] 1960/10/02 [id number]        57 y.o.  [id number] for annual gynecologic exam.  Doing well without gynecologic complaints.  Past medical history,surgical history, problem list, medications, allergies, family history and social history were all reviewed and documented as reviewed in the EPIC chart.  ROS:  Performed with pertinent positives and negatives included in the history, assessment and plan.   Additional significant findings : None   Exam: Sally [name] assistant Vitals:   12/28/17 1532  BP: 128/84  Weight: 292 lb (132.5 kg)  Height: 5\' 4"  (1.626 m)   Body mass index is 50.12 kg/m.  General appearance:  Normal affect, orientation and appearance. Skin: Grossly normal HEENT: Without gross lesions.  No cervical or supraclavicular adenopathy. Thyroid normal.  Lungs:  Clear without wheezing, rales or rhonchi Cardiac: RR, without RMG Abdominal:  Soft, nontender, without masses, guarding, rebound, organomegaly or hernia Breasts:  Examined lying and sitting without masses, retractions, discharge or axillary adenopathy. Pelvic:  Ext, BUS, Vagina: With atrophic changes  Cervix: With atrophic changes  Uterus: Difficult to palpate but no gross masses or tendernessmidline and mobile nontender   Adnexa: Without gross masses or tenderness    Anus and perineum: Normal   Rectovaginal: Normal sphincter tone without palpated masses or tenderness.    Assessment/Plan:  57 y.o. 39 female for annual gynecologic exam.   1. Postmenopausal/atrophic genital changes.  No significant hot flushes, night sweats, vaginal dryness or any vaginal bleeding. 2. Pap smear/HPV 2018.  No Pap smear done today.  No history of abnormal Pap smears previously.  Plan repeat Pap smear at 5-year interval per current screening guidelines. 3. Colonoscopy 2014.  Repeat at their recommended interval. 4. Mammography 09/2017.  Continue with annual mammography next year when due.  Rest exam normal  today. 5. DEXA never.  Is being followed for rheumatoid arthritis on Rheumatrex.  Recommend baseline DEXA now and she will schedule in follow-up for this. 6. Health maintenance.  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  Follow-up for bone density.  Follow-up in 1 year for annual exam, sooner as needed.   10/2017 MD, 3:55 PM 12/28/2017

## 2017-12-28 NOTE — Patient Instructions (Signed)
Follow-up for the bone density as scheduled.  Follow-up in 1 year for annual exam 

## 2018-01-24 ENCOUNTER — Ambulatory Visit (INDEPENDENT_AMBULATORY_CARE_PROVIDER_SITE_OTHER): Payer: Medicare HMO

## 2018-01-24 ENCOUNTER — Other Ambulatory Visit: Payer: Self-pay | Admitting: Gynecology

## 2018-01-24 DIAGNOSIS — Z780 Asymptomatic menopausal state: Secondary | ICD-10-CM

## 2018-01-24 DIAGNOSIS — Z8739 Personal history of other diseases of the musculoskeletal system and connective tissue: Secondary | ICD-10-CM

## 2018-01-24 DIAGNOSIS — Z01419 Encounter for gynecological examination (general) (routine) without abnormal findings: Secondary | ICD-10-CM

## 2018-01-28 ENCOUNTER — Encounter: Payer: Self-pay | Admitting: Gynecology

## 2018-03-06 ENCOUNTER — Other Ambulatory Visit: Payer: Self-pay | Admitting: Pain Medicine

## 2018-03-06 DIAGNOSIS — M545 Low back pain: Principal | ICD-10-CM

## 2018-03-06 DIAGNOSIS — G8929 Other chronic pain: Secondary | ICD-10-CM

## 2018-03-12 ENCOUNTER — Ambulatory Visit
Admission: RE | Admit: 2018-03-12 | Discharge: 2018-03-12 | Disposition: A | Discharge status: Home / Self Care | Payer: Medicare HMO | Source: Ambulatory Visit | Attending: Pain Medicine | Admitting: Pain Medicine

## 2018-03-12 DIAGNOSIS — M545 Low back pain, unspecified: Secondary | ICD-10-CM

## 2018-03-12 DIAGNOSIS — G8929 Other chronic pain: Secondary | ICD-10-CM

## 2018-05-09 ENCOUNTER — Encounter (INDEPENDENT_AMBULATORY_CARE_PROVIDER_SITE_OTHER): Payer: Medicare HMO

## 2018-05-14 ENCOUNTER — Encounter (INDEPENDENT_AMBULATORY_CARE_PROVIDER_SITE_OTHER): Payer: Self-pay | Admitting: Family Medicine

## 2018-05-14 ENCOUNTER — Ambulatory Visit (INDEPENDENT_AMBULATORY_CARE_PROVIDER_SITE_OTHER): Payer: Medicare HMO | Admitting: Family Medicine

## 2018-05-14 VITALS — BP 124/79 | HR 79 | Temp 97.5°F | Ht 64.0 in | Wt 281.0 lb

## 2018-05-14 DIAGNOSIS — R5383 Other fatigue: Secondary | ICD-10-CM

## 2018-05-14 DIAGNOSIS — R0602 Shortness of breath: Secondary | ICD-10-CM | POA: Diagnosis not present

## 2018-05-14 DIAGNOSIS — Z1331 Encounter for screening for depression: Secondary | ICD-10-CM | POA: Diagnosis not present

## 2018-05-14 DIAGNOSIS — Z6841 Body Mass Index (BMI) 40.0 and over, adult: Secondary | ICD-10-CM

## 2018-05-14 DIAGNOSIS — E538 Deficiency of other specified B group vitamins: Secondary | ICD-10-CM

## 2018-05-14 DIAGNOSIS — E7849 Other hyperlipidemia: Secondary | ICD-10-CM

## 2018-05-14 DIAGNOSIS — E119 Type 2 diabetes mellitus without complications: Secondary | ICD-10-CM | POA: Diagnosis not present

## 2018-05-14 DIAGNOSIS — E559 Vitamin D deficiency, unspecified: Secondary | ICD-10-CM | POA: Diagnosis not present

## 2018-05-14 DIAGNOSIS — Z0289 Encounter for other administrative examinations: Secondary | ICD-10-CM

## 2018-05-14 MED ORDER — BLOOD GLUCOSE METER KIT: PACK | 0 refills | Status: DC

## 2018-05-15 LAB — COMPREHENSIVE METABOLIC PANEL
A/G RATIO: 1.7 (ref 1.2–2.2)
ALT: 23 IU/L (ref 0–32)
AST: 18 IU/L (ref 0–40)
Albumin: 4.4 g/dL (ref 3.5–5.5)
Alkaline Phosphatase: 79 IU/L (ref 39–117)
BUN/Creatinine Ratio: 21 (ref 9–23)
BUN: 15 mg/dL (ref 6–24)
Bilirubin Total: 0.3 mg/dL (ref 0.0–1.2)
CALCIUM: 9.1 mg/dL (ref 8.7–10.2)
CHLORIDE: 99 mmol/L (ref 96–106)
CO2: 24 mmol/L (ref 20–29)
Creatinine, Ser: 0.73 mg/dL (ref 0.57–1.00)
GFR, EST AFRICAN AMERICAN: 104 mL/min/{1.73_m2} (ref >59)
GFR, EST NON AFRICAN AMERICAN: 90 mL/min/{1.73_m2} (ref >59)
GLOBULIN, TOTAL: 2.6 g/dL (ref 1.5–4.5)
Glucose: 123 mg/dL — ABNORMAL HIGH (ref 65–99)
POTASSIUM: 4.9 mmol/L (ref 3.5–5.2)
SODIUM: 138 mmol/L (ref 134–144)
TOTAL PROTEIN: 7 g/dL (ref 6.0–8.5)

## 2018-05-15 LAB — CBC WITH DIFFERENTIAL
BASOS: 1 %
Basophils Absolute: 0.1 10*3/uL (ref 0.0–0.2)
EOS (ABSOLUTE): 0.2 10*3/uL (ref 0.0–0.4)
Eos: 3 %
HEMATOCRIT: 37.4 % (ref 34.0–46.6)
Hemoglobin: 12 g/dL (ref 11.1–15.9)
Immature Grans (Abs): 0 10*3/uL (ref 0.0–0.1)
Immature Granulocytes: 0 %
Lymphocytes Absolute: 1.9 10*3/uL (ref 0.7–3.1)
Lymphs: 24 %
MCH: 30.2 pg (ref 26.6–33.0)
MCHC: 32.1 g/dL (ref 31.5–35.7)
MCV: 94 fL (ref 79–97)
MONOS ABS: 0.5 10*3/uL (ref 0.1–0.9)
Monocytes: 6 %
NEUTROS ABS: 5.2 10*3/uL (ref 1.4–7.0)
NEUTROS PCT: 66 %
RBC: 3.97 x10E6/uL (ref 3.77–5.28)
RDW: 14.1 % (ref 12.3–15.4)
WBC: 8 10*3/uL (ref 3.4–10.8)

## 2018-05-15 LAB — MICROALBUMIN / CREATININE URINE RATIO
Creatinine, Urine: 34.5 mg/dL
Microalbumin, Urine: <3 ug/mL

## 2018-05-15 LAB — VITAMIN B12: Vitamin B-12: 706 pg/mL (ref 232–1245)

## 2018-05-15 LAB — LIPID PANEL WITH LDL/HDL RATIO
Cholesterol, Total: 160 mg/dL (ref 100–199)
HDL: 50 mg/dL (ref >39)
LDL CALC: 77 mg/dL (ref 0–99)
LDL/HDL RATIO: 1.5 ratio (ref 0.0–3.2)
TRIGLYCERIDES: 163 mg/dL — AB (ref 0–149)
VLDL CHOLESTEROL CAL: 33 mg/dL (ref 5–40)

## 2018-05-15 LAB — INSULIN, RANDOM: INSULIN: 9.8 u[IU]/mL (ref 2.6–24.9)

## 2018-05-15 LAB — T4, FREE: Free T4: 1.02 ng/dL (ref 0.82–1.77)

## 2018-05-15 LAB — T3: T3, Total: 126 ng/dL (ref 71–180)

## 2018-05-15 LAB — TSH: TSH: 5.15 u[IU]/mL — ABNORMAL HIGH (ref 0.450–4.500)

## 2018-05-15 LAB — HEMOGLOBIN A1C
ESTIMATED AVERAGE GLUCOSE: 148 mg/dL
Hgb A1c MFr Bld: 6.8 % — ABNORMAL HIGH (ref 4.8–5.6)

## 2018-05-15 LAB — VITAMIN D 25 HYDROXY (VIT D DEFICIENCY, FRACTURES): Vit D, 25-Hydroxy: 27.9 ng/mL — ABNORMAL LOW (ref 30.0–100.0)

## 2018-05-15 LAB — FOLATE: Folate: 19.1 ng/mL (ref >3.0)

## 2018-05-15 NOTE — Progress Notes (Signed)
Office: [phone]  /  Fax: [phone]   Dear Dr. Rolena Infante,   Thank you for referring [name] to our clinic. The following note includes my evaluation and treatment recommendations.  HPI:   Chief Complaint: OBESITY    Sally [name] has been referred by Dahari D. Rolena Infante, MD for consultation regarding her obesity and obesity related comorbidities.    Sally [name] (MR# [id number]) is a 57 y.o. female who presents on 05/15/2018 for obesity evaluation and treatment. Current BMI is Body mass index is 48.23 kg/m.[name] [name] has been struggling with her weight for many years and has been unsuccessful in either losing weight, maintaining weight loss, or reaching her healthy weight goal.     [name] attended our information session and states she is currently in the action stage of change and ready to dedicate time achieving and maintaining a healthier weight. Faith is interested in becoming our patient and working on intensive lifestyle modifications including (but not limited to) diet, exercise and weight loss.    [name] states her family eats meals together her desired weight loss is 150 lbs she has been heavy most of  her life she started gaining weight after her mother died her heaviest weight ever was 295 lbs. she has significant food cravings issues  she snacks frequently in the evenings she wakes up frequently in the middle of the night to eat she skips meals frequently she is frequently drinking liquids with calories she frequently makes poor food choices she has problems with excessive hunger  she frequently eats larger portions than normal  she struggles with emotional eating    Sally [name] feels her energy is lower than it should be. This has worsened with weight gain and has not worsened recently. Sally [name] admits to daytime somnolence and admits to waking up still tired. Patient is at risk for obstructive sleep apnea. Patent has a history of symptoms of daytime  Sally, morning Sally and morning headache. Patient generally gets 6 hours of sleep per night, and states they generally have restless sleep. Snoring is present. Apneic episodes are present. Epworth Sleepiness Score is 19  Dyspnea on exertion Sally [name] notes increasing shortness of breath with exercising and seems to be worsening over time with weight gain. She notes getting out of breath sooner with activity than she used to. This has not gotten worse recently. Sally [name] denies orthopnea.  Diabetes II Sally [name] was diagnosed with diabetes type II approximately one year ago. Sally [name] is not checking bloiod sugar at home and she is just on metformin  Sally [name] denies any hypoglycemic episodes, nausea or vomiting. She is attempting to work on intensive lifestyle modifications including diet, exercise, and weight loss to help control her blood glucose levels.  Vitamin D deficiency Sally [name] has a diagnosis of vitamin D deficiency. Sally [name] is currently taking prescription vit D and there are no recent lab results. She admits to Sally and denies nausea, vomiting or muscle weakness.  B12 deficiency Sally [name] is on metformin and is at a higher risk of B12 insufficiency and she notes Sally. Sally [name] is not a vegetarian and does not have a previous diagnosis of pernicious anemia. She does not have a history of weight loss surgery.   Folate Deficiency Sally [name] has folate deficiency and she is on methotrexate as well as prenatal vitamins. There are no recent labs results.  Hyperlipidemia Sally [name] has hyperlipidemia. She is not on statin and there are no recent lab results. She has diabetes and she is attempting to  improve her cholesterol levels with intensive lifestyle modification including a low saturated fat diet, exercise and weight loss. She denies any chest pain.  Depression Screen Sally [name]'s Food and Mood (modified PHQ-9) score was  Depression screen PHQ 2/9 05/14/2018  Decreased Interest 3  Down, Depressed, Hopeless  3  PHQ - 2 Score 6  Altered sleeping 2  Tired, decreased energy 3  Change in appetite 2  Feeling bad or failure about yourself  3  Trouble concentrating 3  Moving slowly or fidgety/restless 3  Suicidal thoughts 0  PHQ-9 Score 22  Difficult doing work/chores Somewhat difficult    ALLERGIES: No Known Allergies  MEDICATIONS: Current Outpatient Medications on File Prior to Visit  Medication Sig Dispense Refill  . albuterol (PROVENTIL HFA;VENTOLIN HFA) 108 (90 BASE) MCG/ACT inhaler Inhale 2 puffs into the lungs every 6 (six) hours as needed. FOR WHEEZING     . beclomethasone (QVAR) 80 MCG/ACT inhaler Inhale 2 puffs into the lungs 2 (two) times daily.    . cyclobenzaprine (FLEXERIL) 10 MG tablet Take 10 mg by mouth 3 (three) times daily as needed for muscle spasms.    . DULoxetine (CYMBALTA) 30 MG capsule Take 30 mg by mouth 3 (three) times daily.    . folic acid (FOLVITE) 1 MG tablet Take 1 mg by mouth at bedtime.   0  . furosemide (LASIX) 20 MG tablet Take 20 mg by mouth daily as needed.    . gabapentin (NEURONTIN) 300 MG capsule Take 300 mg by mouth at bedtime.  0  . meloxicam (MOBIC) 15 MG tablet Take 15 mg by mouth at bedtime.  3  . metFORMIN (GLUCOPHAGE-XR) 500 MG 24 hr tablet Take 500 mg by mouth at bedtime.  3  . methotrexate (RHEUMATREX) 2.5 MG tablet Take 2.5 mg by mouth once a week. Take 8 tablets by mouth once a week on Friday.  3  . montelukast (SINGULAIR) 10 MG tablet Take 10 mg by mouth at bedtime.    . Multiple Vitamins-Minerals (HAIR SKIN & NAILS ADVANCED PO) Take 2 tablets by mouth 2 (two) times daily.    . mupirocin ointment (BACTROBAN) 2 % Place 1 application into the nose 2 (two) times daily as needed.    . traMADol (ULTRAM) 50 MG tablet Take 50 mg by mouth 2 (two) times daily as needed for moderate pain.     . Vitamin D, Ergocalciferol, (DRISDOL) 50000 units CAPS capsule Take 50,000 Units by mouth once a week. On Friday  0   No current facility-administered  medications on file prior to visit.     PAST MEDICAL HISTORY: Past Medical History:  Diagnosis Date  . ADD (attention deficit disorder)   . Anemia   . Anxiety   . Arthritis   . Asthma   . Back pain   . CFS (chronic Sally syndrome)   . Colitis   . Depression   . Diabetes ([place])   . Dyspnea   . Fibromyalgia   . GERD (gastroesophageal reflux disease)   . Hx of blood clots   . IBS (irritable bowel syndrome)   . Joint pain   . Left knee pain   . Leg edema   . Osteoarthritis   . Prediabetes   . Pulmonary embolism ([place])   . RA (rheumatoid arthritis) ([place])   . RLS (restless legs syndrome)   . Sleep apnea   . Vitamin B 12 deficiency   . Vitamin D deficiency     PAST SURGICAL HISTORY: Past  Surgical History:  Procedure Laterality Date  . EYE SURGERY    . MOUTH SURGERY      SOCIAL HISTORY: Social History   Tobacco Use  . Smoking status: Former Smoker    Packs/day: 1.50    Years: 8.00    Pack years: 12.00  . Smokeless tobacco: Never Used  Substance Use Topics  . Alcohol use: No    Alcohol/week: 0.0 standard drinks  . Drug use: No    FAMILY HISTORY: Family History  Problem Relation Age of Onset  . Pneumonia Mother   . Asthma Mother   . Diabetes Mother   . Hypertension Mother   . Depression Mother   . Anxiety disorder Mother   . Eating disorder Mother   . Obesity Mother   . Sudden death Mother   . Cancer Father        Brain tumor  . Asthma Father     ROS: Review of Systems  Constitutional: Positive for malaise/Sally.       + Fever/Chills + Swollen Glands  HENT: Positive for congestion (nasal stuffiness), nosebleeds and sinus pain.        + Hay Fever + Dry Mouth + Hoarseness  Eyes: Positive for pain.       + Vision Changes + Blurry or Double Vision + Floaters  Respiratory: Positive for cough, shortness of breath (with activity) and wheezing.        + Painful or Difficulty Breathing  Cardiovascular: Negative for chest pain and orthopnea.        + Sudden Awakening from Sleep with Shortness of Breath Calf/Leg Pain with Walking + Leg Cramping + Very Cold Feet or Hands  Gastrointestinal: Positive for diarrhea and heartburn. Negative for nausea and vomiting.  Genitourinary: Positive for frequency.  Musculoskeletal: Positive for back pain and neck pain.       + Neck Stiffness + Muscle or Joint Pain + Muscle Stiffness + Red or Swollen Joints Negative for muscle weakness  Skin: Positive for itching and rash.       + Dryness + Hair or Nail Changes  Neurological: Positive for weakness and headaches.  Endo/Heme/Allergies: Positive for polydipsia. Bruises/bleeds easily.       + Polyphagia + Heat or Cold Intolerance Negative for hypoglycemia  Psychiatric/Behavioral: Positive for depression. The patient is nervous/anxious (nervouseness) and has insomnia.        + Stress    PHYSICAL EXAM: Blood pressure 124/79, pulse 79, temperature (!) 97.5 F (36.4 C), temperature source Oral, height '5\' 4"'$  (1.626 m), weight 281 lb (127.5 kg), last menstrual period 05/31/2012, SpO2 100 %. Body mass index is 48.23 kg/m. Physical Exam  Constitutional: She is oriented to person, place, and time. She appears well-developed and well-nourished.  HENT:  Head: Normocephalic and atraumatic.  Nose: Nose normal.  Eyes: EOM are normal. No scleral icterus.  Neck: Normal range of motion. Neck supple. No thyromegaly present.  Cardiovascular: Normal rate and regular rhythm.  Pulmonary/Chest: No respiratory distress.  Abdominal: Soft. There is no tenderness.  Musculoskeletal: Normal range of motion.  Neurological: She is alert and oriented to person, place, and time. Coordination normal.  Skin: Skin is warm and dry.  Psychiatric: She has a normal mood and affect.  Vitals reviewed.   RECENT LABS AND TESTS: BMET    Component Value Date/Time   NA 138 05/14/2018 0941   K 4.9 05/14/2018 0941   CL 99 05/14/2018 0941   CO2 24 05/14/2018 0941   GLUCOSE 123  (  H) 05/14/2018 0941   GLUCOSE 130 (H) 11/06/2017 0222   BUN 15 05/14/2018 0941   CREATININE 0.73 05/14/2018 0941   CALCIUM 9.1 05/14/2018 0941   GFRNONAA 90 05/14/2018 0941   GFRAA 104 05/14/2018 0941   Lab Results  Component Value Date   HGBA1C 6.8 (H) 05/14/2018   Lab Results  Component Value Date   INSULIN 9.8 05/14/2018   CBC    Component Value Date/Time   WBC 8.0 05/14/2018 0941   WBC 10.1 11/06/2017 0222   RBC 3.97 05/14/2018 0941   RBC 4.31 11/06/2017 0222   HGB 12.0 05/14/2018 0941   HCT 37.4 05/14/2018 0941   PLT 380 11/06/2017 0222   MCV 94 05/14/2018 0941   MCH 30.2 05/14/2018 0941   MCH 29.0 11/06/2017 0222   MCHC 32.1 05/14/2018 0941   MCHC 32.3 11/06/2017 0222   RDW 14.1 05/14/2018 0941   LYMPHSABS 1.9 05/14/2018 0941   MONOABS 0.6 11/06/2017 0222   EOSABS 0.2 05/14/2018 0941   BASOSABS 0.1 05/14/2018 0941   Iron/TIBC/Ferritin/ %Sat No results found for: IRON, TIBC, FERRITIN, IRONPCTSAT Lipid Panel     Component Value Date/Time   CHOL 160 05/14/2018 0941   TRIG 163 (H) 05/14/2018 0941   HDL 50 05/14/2018 0941   LDLCALC 77 05/14/2018 0941   Hepatic Function Panel     Component Value Date/Time   PROT 7.0 05/14/2018 0941   ALBUMIN 4.4 05/14/2018 0941   AST 18 05/14/2018 0941   ALT 23 05/14/2018 0941   ALKPHOS 79 05/14/2018 0941   BILITOT 0.3 05/14/2018 0941      Component Value Date/Time   TSH 5.150 (H) 05/14/2018 0941    ECG  shows NSR with a rate of 81 BPM INDIRECT CALORIMETER done today shows a VO2 of 296 and a REE of 2061.  Her calculated basal metabolic rate is 4098 thus her basal metabolic rate is better than expected.    ASSESSMENT AND PLAN: Other Sally - Plan: EKG 12-Lead, CBC With Differential, T3, T4, free, TSH  Shortness of breath on exertion - Plan: CBC With Differential  Type 2 diabetes mellitus without complication, without long-term current use of insulin (HCC) - Plan: Comprehensive metabolic panel, Hemoglobin A1c,  Insulin, random, blood glucose meter kit and supplies  Vitamin D deficiency - Plan: VITAMIN D 25 Hydroxy (Vit-D Deficiency, Fractures)  B12 nutritional deficiency - Plan: Vitamin B12  Folate deficiency - Plan: Folate  Other hyperlipidemia - Plan: Lipid Panel With LDL/HDL Ratio  Depression screening  Class 3 severe obesity with serious comorbidity and body mass index (BMI) of 45.0 to 49.9 in adult, unspecified obesity type ([place])  PLAN: Sally Lauryl was informed that her Sally may be related to obesity, depression or many other causes. Labs will be ordered, and in the meanwhile Emelly has agreed to work on diet, exercise and weight loss to help with Sally. Proper sleep hygiene was discussed including the need for 7-8 hours of quality sleep each night. A sleep study was not ordered based on symptoms and Epworth score.  Dyspnea on exertion Chasiti's shortness of breath appears to be obesity related and exercise induced. She has agreed to work on weight loss and gradually increase exercise to treat her exercise induced shortness of breath. If Danyka follows our instructions and loses weight without improvement of her shortness of breath, we will plan to refer to pulmonology. We will monitor this condition regularly. Simra agrees to this plan.  Diabetes II Breawna has been given  extensive diabetes education by myself today including ideal fasting and post-prandial blood glucose readings, individual ideal Hgb A1c goals and hypoglycemia prevention. We discussed the importance of good blood sugar control to decrease the likelihood of diabetic complications such as nephropathy, neuropathy, limb loss, blindness, coronary artery disease, and death. We discussed the importance of intensive lifestyle modification including diet, exercise and weight loss as the first line treatment for diabetes. Mally agrees to continue her diabetes medications and a prescription was written today for One Touch meter  #1. Iyari agrees to start diet and follow up at the agreed upon time.   Vitamin D Deficiency Markiah was informed that low vitamin D levels contributes to Sally and are associated with obesity, breast, and colon cancer. She will continue to take prescription Vit D '@50'$ ,629 IU every week and will follow up for routine testing of vitamin D, at least 2-3 times per year. She was informed of the risk of over-replacement of vitamin D and agrees to not increase her dose unless she discusses this with [name] first. We will check vitamin D level and Makayleigh agrees to follow up as directed.  B12 deficiency Tameeka will work on increasing B12 rich foods in her diet. B12 supplementation was not prescribed today. We will check labs today and results will be discussed with Sally [name] in 2 weeks at her follow up visit.  Folate Deficiency We will check labs and Avonne will continue to take prenatal vitamins. Cesily will follow up with our clinic at the agreed upon time.  Hyperlipidemia Sharren was informed of the American Heart Association Guidelines emphasizing intensive lifestyle modifications as the first line treatment for hyperlipidemia. We discussed many lifestyle modifications today in depth, and Rushie will start to work on decreasing saturated fats such as fatty red meat, butter and many fried foods. She will also increase vegetables and lean protein in her diet and work on exercise and weight loss efforts. We will check labs and Asmara will follow up as directed.  Depression Screen Zanasia had a strongly positive depression screening. Depression is commonly associated with obesity and often results in emotional eating behaviors. We will monitor this closely and work on CBT to help improve the non-hunger eating patterns. Referral to Psychology may be required if no improvement is seen as she continues in our clinic.  Obesity Taylar is currently in the action stage of change and her goal is to continue with weight  loss efforts. I recommend Nora begin the structured treatment plan as follows:  She has agreed to follow the Category 3 plan Azya has been instructed to eventually work up to a goal of 150 minutes of combined cardio and strengthening exercise per week for weight loss and overall health benefits. We discussed the following Behavioral Modification Strategies today: increasing lean protein intake, decreasing simple carbohydrates  and work on meal planning and easy cooking plans   She was informed of the importance of frequent follow up visits to maximize her success with intensive lifestyle modifications for her multiple health conditions. She was informed we would discuss her lab results at her next visit unless there is a critical issue that needs to be addressed sooner. Janann agreed to keep her next visit at the agreed upon time to discuss these results.    OBESITY BEHAVIORAL INTERVENTION VISIT  Today's visit was # 1   Starting weight: 281 lbs Starting date: 05/14/18 Today's weight : 281 lbs Today's date: 05/14/2018 Total lbs lost to date: 0 At least 27  minutes were spent on discussing the following behavioral intervention visit.   ASK: We discussed the diagnosis of obesity with Alyson Ingles today and Symantha agreed to give [name] permission to discuss obesity behavioral modification therapy today.  ASSESS: Simone has the diagnosis of obesity and her BMI today is 48.21 Richa is in the action stage of change   ADVISE: Lashawne was educated on the multiple health risks of obesity as well as the benefit of weight loss to improve her health. She was advised of the need for long term treatment and the importance of lifestyle modifications to improve her current health and to decrease her risk of future health problems.  AGREE: Multiple dietary modification options and treatment options were discussed and  Alda agreed to follow the recommendations documented in the above  note.  ARRANGE: Casy was educated on the importance of frequent visits to treat obesity as outlined per CMS and USPSTF guidelines and agreed to schedule her next follow up appointment today.  I, Doreene Nest, am acting as transcriptionist for Dennard Nip, MD  I have reviewed the above documentation for accuracy and completeness, and I agree with the above. -Dennard Nip, MD

## 2018-05-27 ENCOUNTER — Ambulatory Visit (INDEPENDENT_AMBULATORY_CARE_PROVIDER_SITE_OTHER): Payer: Medicare HMO | Admitting: Family Medicine

## 2018-05-27 VITALS — BP 135/81 | HR 81 | Temp 97.6°F | Ht 64.0 in | Wt 276.0 lb

## 2018-05-27 DIAGNOSIS — E559 Vitamin D deficiency, unspecified: Secondary | ICD-10-CM | POA: Diagnosis not present

## 2018-05-27 DIAGNOSIS — Z6841 Body Mass Index (BMI) 40.0 and over, adult: Secondary | ICD-10-CM

## 2018-05-27 DIAGNOSIS — E781 Pure hyperglyceridemia: Secondary | ICD-10-CM

## 2018-05-27 DIAGNOSIS — Z9189 Other specified personal risk factors, not elsewhere classified: Secondary | ICD-10-CM

## 2018-05-27 DIAGNOSIS — E1165 Type 2 diabetes mellitus with hyperglycemia: Secondary | ICD-10-CM

## 2018-05-27 MED ORDER — VITAMIN D (ERGOCALCIFEROL) 1.25 MG (50000 UNIT) PO CAPS: 50000.0000 [IU] | ORAL_CAPSULE | ORAL | 0 refills | Status: DC

## 2018-05-27 MED ORDER — BLOOD GLUCOSE METER KIT: PACK | 0 refills | Status: DC

## 2018-05-27 MED ORDER — METFORMIN HCL ER 500 MG PO TB24: 500.0000 mg | ORAL_TABLET | Freq: Two times a day (BID) | ORAL | 0 refills | Status: DC

## 2018-05-28 NOTE — Progress Notes (Signed)
Office: [phone]  /  Fax: [phone]   HPI:   Chief Complaint: OBESITY Sally [name] is here to discuss her progress with her obesity treatment plan. She is on the  follow the Category 3 plan and is following her eating plan approximately 0 % of the time. She states she is exercising by doing Tai Chi for 60 minutes 2 times per week. Apple did not start her diet plan due to having to eat the food she already had in her house. She went grocery shopping last weekend and is ready to start the Category 3 plan. She notes significant hunger as she tried to decrease calories.  Her weight is 276 lb (125.2 kg) today and has had a weight loss of 5 pounds over a period of 2 weeks since her last visit. She has lost 5 lbs since starting treatment with [name].  Vitamin D deficiency Sally [name] has a diagnosis of vitamin D deficiency. She is currently taking vit D weekly but not yet at goal. She denies nausea, vomiting or muscle weakness.  Diabetes II Sally [name] has a diagnosis of diabetes type II. [name] states patient does not check sugars due to not being able to get the glucometer due to pharmacy error. and denies any hypoglycemic episodes but admits to polyphagia. Last A1c was 6.8. She has been working on intensive lifestyle modifications including diet, exercise, and weight loss to help control her blood glucose levels.  Increased Triglycerides Sally [name] has increased triglycerides. She is attempting to diet control. She denies abdominal pain and chest pain.   At risk for cardiovascular disease Sally [name] is at a higher than average risk for cardiovascular disease due to obesity. She currently denies any chest pain.  ALLERGIES: No Known Allergies  MEDICATIONS: Current Outpatient Medications on File Prior to Visit  Medication Sig Dispense Refill  . albuterol (PROVENTIL HFA;VENTOLIN HFA) 108 (90 BASE) MCG/ACT inhaler Inhale 2 puffs into the lungs every 6 (six) hours as needed. FOR WHEEZING     . beclomethasone  (QVAR) 80 MCG/ACT inhaler Inhale 2 puffs into the lungs 2 (two) times daily.    . cyclobenzaprine (FLEXERIL) 10 MG tablet Take 10 mg by mouth 3 (three) times daily as needed for muscle spasms.    . DULoxetine (CYMBALTA) 30 MG capsule Take 30 mg by mouth 3 (three) times daily.    . folic acid (FOLVITE) 1 MG tablet Take 1 mg by mouth at bedtime.   0  . furosemide (LASIX) 20 MG tablet Take 20 mg by mouth daily as needed.    . gabapentin (NEURONTIN) 300 MG capsule Take 300 mg by mouth at bedtime.  0  . meloxicam (MOBIC) 15 MG tablet Take 15 mg by mouth at bedtime.  3  . methotrexate (RHEUMATREX) 2.5 MG tablet Take 2.5 mg by mouth once a week. Take 8 tablets by mouth once a week on Friday.  3  . montelukast (SINGULAIR) 10 MG tablet Take 10 mg by mouth at bedtime.    . Multiple Vitamins-Minerals (HAIR SKIN & NAILS ADVANCED PO) Take 2 tablets by mouth 2 (two) times daily.    . mupirocin ointment (BACTROBAN) 2 % Place 1 application into the nose 2 (two) times daily as needed.    . traMADol (ULTRAM) 50 MG tablet Take 50 mg by mouth 2 (two) times daily as needed for moderate pain.      No current facility-administered medications on file prior to visit.     PAST MEDICAL HISTORY: Past Medical History:  Diagnosis  Date  . ADD (attention deficit disorder)   . Anemia   . Anxiety   . Arthritis   . Asthma   . Back pain   . CFS (chronic fatigue syndrome)   . Colitis   . Depression   . Diabetes ([place])   . Dyspnea   . Fibromyalgia   . GERD (gastroesophageal reflux disease)   . Hx of blood clots   . IBS (irritable bowel syndrome)   . Joint pain   . Left knee pain   . Leg edema   . Osteoarthritis   . Prediabetes   . Pulmonary embolism ([place])   . RA (rheumatoid arthritis) ([place])   . RLS (restless legs syndrome)   . Sleep apnea   . Vitamin B 12 deficiency   . Vitamin D deficiency     PAST SURGICAL HISTORY: Past Surgical History:  Procedure Laterality Date  . EYE SURGERY    . MOUTH SURGERY        SOCIAL HISTORY: Social History   Tobacco Use  . Smoking status: Former Smoker    Packs/day: 1.50    Years: 8.00    Pack years: 12.00  . Smokeless tobacco: Never Used  Substance Use Topics  . Alcohol use: No    Alcohol/week: 0.0 standard drinks  . Drug use: No    FAMILY HISTORY: Family History  Problem Relation Age of Onset  . Pneumonia Mother   . Asthma Mother   . Diabetes Mother   . Hypertension Mother   . Depression Mother   . Anxiety disorder Mother   . Eating disorder Mother   . Obesity Mother   . Sudden death Mother   . Cancer Father        Brain tumor  . Asthma Father     ROS: Review of Systems  Constitutional: Positive for weight loss.  Cardiovascular: Negative for chest pain.  Gastrointestinal: Negative for abdominal pain, nausea and vomiting.  Musculoskeletal:       Negative for muscle weakness  Endo/Heme/Allergies:       Positive for polyphagia     PHYSICAL EXAM: Blood pressure 135/81, pulse 81, temperature 97.6 F (36.4 C), temperature source Oral, height '5\' 4"'$  (1.626 m), weight 276 lb (125.2 kg), last menstrual period 05/31/2012, SpO2 100 %. Body mass index is 47.38 kg/m. Physical Exam  Constitutional: She is oriented to person, place, and time. She appears well-developed and well-nourished.  HENT:  Head: Normocephalic.  Neck: Normal range of motion.  Cardiovascular: Normal rate.  Pulmonary/Chest: Effort normal.  Musculoskeletal: Normal range of motion.  Neurological: She is alert and oriented to person, place, and time.  Skin: Skin is warm and dry.  Psychiatric: She has a normal mood and affect. Her behavior is normal.  Vitals reviewed.   RECENT LABS AND TESTS: BMET    Component Value Date/Time   NA 138 05/14/2018 0941   K 4.9 05/14/2018 0941   CL 99 05/14/2018 0941   CO2 24 05/14/2018 0941   GLUCOSE 123 (H) 05/14/2018 0941   GLUCOSE 130 (H) 11/06/2017 0222   BUN 15 05/14/2018 0941   CREATININE 0.73 05/14/2018 0941   CALCIUM  9.1 05/14/2018 0941   GFRNONAA 90 05/14/2018 0941   GFRAA 104 05/14/2018 0941   Lab Results  Component Value Date   HGBA1C 6.8 (H) 05/14/2018   Lab Results  Component Value Date   INSULIN 9.8 05/14/2018   CBC    Component Value Date/Time   WBC 8.0 05/14/2018 0941  WBC 10.1 11/06/2017 0222   RBC 3.97 05/14/2018 0941   RBC 4.31 11/06/2017 0222   HGB 12.0 05/14/2018 0941   HCT 37.4 05/14/2018 0941   PLT 380 11/06/2017 0222   MCV 94 05/14/2018 0941   MCH 30.2 05/14/2018 0941   MCH 29.0 11/06/2017 0222   MCHC 32.1 05/14/2018 0941   MCHC 32.3 11/06/2017 0222   RDW 14.1 05/14/2018 0941   LYMPHSABS 1.9 05/14/2018 0941   MONOABS 0.6 11/06/2017 0222   EOSABS 0.2 05/14/2018 0941   BASOSABS 0.1 05/14/2018 0941   Iron/TIBC/Ferritin/ %Sat No results found for: IRON, TIBC, FERRITIN, IRONPCTSAT Lipid Panel     Component Value Date/Time   CHOL 160 05/14/2018 0941   TRIG 163 (H) 05/14/2018 0941   HDL 50 05/14/2018 0941   LDLCALC 77 05/14/2018 0941   Hepatic Function Panel     Component Value Date/Time   PROT 7.0 05/14/2018 0941   ALBUMIN 4.4 05/14/2018 0941   AST 18 05/14/2018 0941   ALT 23 05/14/2018 0941   ALKPHOS 79 05/14/2018 0941   BILITOT 0.3 05/14/2018 0941      Component Value Date/Time   TSH 5.150 (H) 05/14/2018 0941    Ref. Range 05/14/2018 09:41  Vitamin D, 25-Hydroxy Latest Ref Range: 30.0 - 100.0 ng/mL 27.9 (L)    ASSESSMENT AND PLAN: Type 2 diabetes mellitus with hyperglycemia, without long-term current use of insulin ([place]) - Plan: blood glucose meter kit and supplies, metFORMIN (GLUCOPHAGE-XR) 500 MG 24 hr tablet  Vitamin D deficiency - Plan: Vitamin D, Ergocalciferol, (DRISDOL) 50000 units CAPS capsule  High triglycerides  At risk for heart disease  Class 3 severe obesity with serious comorbidity and body mass index (BMI) of 45.0 to 49.9 in adult, unspecified obesity type ([place])  PLAN: Vitamin D Deficiency Nayana was informed that low vitamin D  levels contributes to fatigue and are associated with obesity, breast, and colon cancer. She agrees to increase prescription Vit D to '@50'$ ,000 IU every 3 days #10 with no refills and will follow up for routine testing of vitamin D, at least 2-3 times per year. She was informed of the risk of over-replacement of vitamin D and agrees to not increase her dose unless she discusses this with [name] first.  Diabetes II Alexxia has been given extensive diabetes education by myself today including ideal fasting and post-prandial blood glucose readings, individual ideal HgA1c goals  and hypoglycemia prevention. We discussed the importance of good blood sugar control to decrease the likelihood of diabetic complications such as nephropathy, neuropathy, limb loss, blindness, coronary artery disease, and death. We discussed the importance of intensive lifestyle modification including diet, exercise and weight loss as the first line treatment for diabetes. Galileah agrees to increase Metformin to 500 mg BID #60 with no refills. We will send in glucometer today and will follow up at the agreed upon time.  Increased Triglycerides Kaiyah educated on decreasing simple carbohydrates and continuing with weight loss. We will repeats labs in 3 months. Agrees to follow up with our clinic as directed.   Cardiovascular risk counseling Deshondra was given extended (15 minutes) coronary artery disease prevention counseling today. She is 57 y.o. female and has risk factors for heart disease including obesity. We discussed intensive lifestyle modifications today with an emphasis on specific weight loss instructions and strategies. Pt was also informed of the importance of increasing exercise and decreasing saturated fats to help prevent heart disease.  Obesity Tifanny is currently in the action stage of change.  As such, her goal is to continue with weight loss efforts She has agreed to follow the Category 3 plan Malayah has been instructed to  work up to a goal of 150 minutes of combined cardio and strengthening exercise per week for weight loss and overall health benefits. We discussed the following Behavioral Modification Strategies today: increasing lean protein intake, decreasing simple carbohydrates, no skipping meals,  and work on meal planning and easy cooking plans   Naz has agreed to follow up with our clinic in 2 weeks. She was informed of the importance of frequent follow up visits to maximize her success with intensive lifestyle modifications for her multiple health conditions.   OBESITY BEHAVIORAL INTERVENTION VISIT  Today's visit was # 2   Starting weight: 281 lb Starting date: 05/14/18 Today's weight : 276 lb Today's date: 05/27/18 Total lbs lost to date: 5 lb    ASK: We discussed the diagnosis of obesity with Alyson Ingles today and Tonetta agreed to give [name] permission to discuss obesity behavioral modification therapy today.  ASSESS: Talullah has the diagnosis of obesity and her BMI today is 47.354 Kambrey is in the action stage of change   ADVISE: Jordon was educated on the multiple health risks of obesity as well as the benefit of weight loss to improve her health. She was advised of the need for long term treatment and the importance of lifestyle modifications to improve her current health and to decrease her risk of future health problems.  AGREE: Multiple dietary modification options and treatment options were discussed and  Akili agreed to follow the recommendations documented in the above note.  ARRANGE: Larayah was educated on the importance of frequent visits to treat obesity as outlined per CMS and USPSTF guidelines and agreed to schedule her next follow up appointment today.  I, Renee Ramus, am acting as transcriptionist for Dennard Nip, MD   I have reviewed the above documentation for accuracy and completeness, and I agree with the above. -Dennard Nip, MD

## 2018-06-12 ENCOUNTER — Ambulatory Visit (INDEPENDENT_AMBULATORY_CARE_PROVIDER_SITE_OTHER): Payer: Medicare HMO | Admitting: Family Medicine

## 2018-06-12 ENCOUNTER — Encounter (INDEPENDENT_AMBULATORY_CARE_PROVIDER_SITE_OTHER): Payer: Self-pay | Admitting: Family Medicine

## 2018-06-12 VITALS — BP 128/81 | HR 84 | Temp 97.9°F | Ht 64.0 in | Wt 272.0 lb

## 2018-06-12 DIAGNOSIS — Z6841 Body Mass Index (BMI) 40.0 and over, adult: Secondary | ICD-10-CM | POA: Diagnosis not present

## 2018-06-12 DIAGNOSIS — F3289 Other specified depressive episodes: Secondary | ICD-10-CM | POA: Diagnosis not present

## 2018-06-12 DIAGNOSIS — E559 Vitamin D deficiency, unspecified: Secondary | ICD-10-CM | POA: Diagnosis not present

## 2018-06-12 DIAGNOSIS — E119 Type 2 diabetes mellitus without complications: Secondary | ICD-10-CM | POA: Diagnosis not present

## 2018-06-17 NOTE — Progress Notes (Signed)
Office: [phone]  /  Fax: [phone]   HPI:   Chief Complaint: OBESITY Sally [name] is here to discuss her progress with her obesity treatment plan. She is on the  follow the Category 3 plan and is following her eating plan approximately 50 % of the time. She states she is exercising by doing Tai Chi for 60 minutes 2 times per week. Sally [name] is currently struggling with eating food early in the day. She is not eating all of the food on the plan.  Her weight is 272 lb (123.4 kg) today and has had a weight loss of 4 pounds over a period of 3 weeks since her last visit. She has lost 9 lbs since starting treatment with [name].  Diabetes II Sally [name] has a diagnosis of diabetes type II. She is on metformin. Sally [name] states fasting BGs range between 90 and 100 and 2 hours postprandial BGs range between 130 and 160 and denies any hypoglycemic episodes, but admits to polyphagia in the afternoon and night time. Last A1c was 6.8. She has been working on intensive lifestyle modifications including diet, exercise, and weight loss to help control her blood glucose levels. She is currently on Metformin BID.   Vitamin D deficiency Sally [name] has a diagnosis of vitamin D deficiency. Vit D level is stable but not at goal. She is currently taking vit D and denies nausea, vomiting or muscle weakness.  Ref. Range 05/14/2018 09:41  Vitamin D, 25-Hydroxy Latest Ref Range: 30.0 - 100.0 ng/mL 27.9 (L)   Depression with emotional eating behaviors Sally [name] is struggling with emotional eating and using food for comfort to the extent that it is negatively impacting her health. She often snacks when she is not hungry. Sally [name] sometimes feels she is out of control and then feels guilty that she made poor food choices. She has been working on behavior modification techniques to help reduce her emotional eating and has been somewhat successful. She reports she wants to eat when not hungry especially in the evenings. She shows no sign of  suicidal or homicidal ideations.  Depression screen PHQ 2/9 05/14/2018  Sally [name] Interest 3  Down, Depressed, Hopeless 3  PHQ - 2 Score 6  Altered sleeping 2  Tired, Sally [name] energy 3  Change in appetite 2  Feeling bad or failure about yourself  3  Trouble concentrating 3  Moving slowly or fidgety/restless 3  Suicidal thoughts 0  PHQ-9 Score 22  Difficult doing work/chores Somewhat difficult    ALLERGIES: No Known Allergies  MEDICATIONS: Current Outpatient Medications on File Prior to Visit  Medication Sig Dispense Refill  . albuterol (PROVENTIL HFA;VENTOLIN HFA) 108 (90 BASE) MCG/ACT inhaler Inhale 2 puffs into the lungs every 6 (six) hours as needed. FOR WHEEZING     . beclomethasone (QVAR) 80 MCG/ACT inhaler Inhale 2 puffs into the lungs 2 (two) times daily.    . blood glucose meter kit and supplies Dispense based on patient and insurance preference. Use up to four times daily as directed. (FOR ICD-10 E10.9, E11.9). Test twice daily 1 each 0  . cyclobenzaprine (FLEXERIL) 10 MG tablet Take 10 mg by mouth 3 (three) times daily as needed for muscle spasms.    . DULoxetine (CYMBALTA) 30 MG capsule Take 30 mg by mouth 3 (three) times daily.    . folic acid (FOLVITE) 1 MG tablet Take 1 mg by mouth at bedtime.   0  . furosemide (LASIX) 20 MG tablet Take 20 mg by mouth daily as needed.    [name]  gabapentin (NEURONTIN) 300 MG capsule Take 300 mg by mouth at bedtime.  0  . meloxicam (MOBIC) 15 MG tablet Take 15 mg by mouth at bedtime.  3  . metFORMIN (GLUCOPHAGE-XR) 500 MG 24 hr tablet Take 1 tablet (500 mg total) by mouth 2 (two) times daily. 60 tablet 0  . methotrexate (RHEUMATREX) 2.5 MG tablet Take 2.5 mg by mouth once a week. Take 8 tablets by mouth once a week on Friday.  3  . montelukast (SINGULAIR) 10 MG tablet Take 10 mg by mouth at bedtime.    . Multiple Vitamins-Minerals (HAIR SKIN & NAILS ADVANCED PO) Take 2 tablets by mouth 2 (two) times daily.    . mupirocin ointment  (BACTROBAN) 2 % Place 1 application into the nose 2 (two) times daily as needed.    . traMADol (ULTRAM) 50 MG tablet Take 50 mg by mouth 2 (two) times daily as needed for moderate pain.     . Vitamin D, Ergocalciferol, (DRISDOL) 50000 units CAPS capsule Take 1 capsule (50,000 Units total) by mouth every 3 (three) days. On Friday 10 capsule 0   No current facility-administered medications on file prior to visit.     PAST MEDICAL HISTORY: Past Medical History:  Diagnosis Date  . ADD (attention deficit disorder)   . Anemia   . Anxiety   . Arthritis   . Asthma   . Back pain   . CFS (chronic fatigue syndrome)   . Colitis   . Depression   . Diabetes ([place])   . Dyspnea   . Fibromyalgia   . GERD (gastroesophageal reflux disease)   . Hx of blood clots   . IBS (irritable bowel syndrome)   . Joint pain   . Left knee pain   . Leg edema   . Osteoarthritis   . Prediabetes   . Pulmonary embolism ([place])   . RA (rheumatoid arthritis) ([place])   . RLS (restless legs syndrome)   . Sleep apnea   . Vitamin B 12 deficiency   . Vitamin D deficiency     PAST SURGICAL HISTORY: Past Surgical History:  Procedure Laterality Date  . EYE SURGERY    . MOUTH SURGERY      SOCIAL HISTORY: Social History   Tobacco Use  . Smoking status: Former Smoker    Packs/day: 1.50    Years: 8.00    Pack years: 12.00  . Smokeless tobacco: Never Used  Substance Use Topics  . Alcohol use: No    Alcohol/week: 0.0 standard drinks  . Drug use: No    FAMILY HISTORY: Family History  Problem Relation Age of Onset  . Pneumonia Mother   . Asthma Mother   . Diabetes Mother   . Hypertension Mother   . Depression Mother   . Anxiety disorder Mother   . Eating disorder Mother   . Obesity Mother   . Sudden death Mother   . Cancer Father        Brain tumor  . Asthma Father     ROS: Review of Systems  Constitutional: Positive for weight loss.  Gastrointestinal: Negative for nausea and vomiting.    Musculoskeletal:       Negative for muscle weakness  Endo/Heme/Allergies:       Negative for hypoglycemia Positive for polyphagia  Psychiatric/Behavioral: Positive for depression. Negative for suicidal ideas.       Negative for homicidal ideations     PHYSICAL EXAM: Blood pressure 128/81, pulse 84, temperature 97.9 F (36.6  C), temperature source Oral, height 5' 4"  (1.626 m), weight 272 lb (123.4 kg), last menstrual period 05/31/2012, SpO2 97 %. Body mass index is 46.69 kg/m. Physical Exam  Constitutional: She is oriented to person, place, and time. She appears well-developed and well-nourished.  Cardiovascular: Normal rate.  Pulmonary/Chest: Effort normal.  Musculoskeletal: Normal range of motion.  Neurological: She is alert and oriented to person, place, and time.  Skin: Skin is warm and dry.  Psychiatric: She has a normal mood and affect. Her behavior is normal.  Vitals reviewed.   RECENT LABS AND TESTS: BMET    Component Value Date/Time   NA 138 05/14/2018 0941   K 4.9 05/14/2018 0941   CL 99 05/14/2018 0941   CO2 24 05/14/2018 0941   GLUCOSE 123 (H) 05/14/2018 0941   GLUCOSE 130 (H) 11/06/2017 0222   BUN 15 05/14/2018 0941   CREATININE 0.73 05/14/2018 0941   CALCIUM 9.1 05/14/2018 0941   GFRNONAA 90 05/14/2018 0941   GFRAA 104 05/14/2018 0941   Lab Results  Component Value Date   HGBA1C 6.8 (H) 05/14/2018   Lab Results  Component Value Date   INSULIN 9.8 05/14/2018   CBC    Component Value Date/Time   WBC 8.0 05/14/2018 0941   WBC 10.1 11/06/2017 0222   RBC 3.97 05/14/2018 0941   RBC 4.31 11/06/2017 0222   HGB 12.0 05/14/2018 0941   HCT 37.4 05/14/2018 0941   PLT 380 11/06/2017 0222   MCV 94 05/14/2018 0941   MCH 30.2 05/14/2018 0941   MCH 29.0 11/06/2017 0222   MCHC 32.1 05/14/2018 0941   MCHC 32.3 11/06/2017 0222   RDW 14.1 05/14/2018 0941   LYMPHSABS 1.9 05/14/2018 0941   MONOABS 0.6 11/06/2017 0222   EOSABS 0.2 05/14/2018 0941   BASOSABS  0.1 05/14/2018 0941   Iron/TIBC/Ferritin/ %Sat No results found for: IRON, TIBC, FERRITIN, IRONPCTSAT Lipid Panel     Component Value Date/Time   CHOL 160 05/14/2018 0941   TRIG 163 (H) 05/14/2018 0941   HDL 50 05/14/2018 0941   LDLCALC 77 05/14/2018 0941   Hepatic Function Panel     Component Value Date/Time   PROT 7.0 05/14/2018 0941   ALBUMIN 4.4 05/14/2018 0941   AST 18 05/14/2018 0941   ALT 23 05/14/2018 0941   ALKPHOS 79 05/14/2018 0941   BILITOT 0.3 05/14/2018 0941      Component Value Date/Time   TSH 5.150 (H) 05/14/2018 0941    Ref. Range 05/14/2018 09:41  Vitamin D, 25-Hydroxy Latest Ref Range: 30.0 - 100.0 ng/mL 27.9 (L)    ASSESSMENT AND PLAN: Type 2 diabetes mellitus without complication, without long-term current use of insulin ([facility])  Vitamin D deficiency  Other depression - with emotional eating   Class 3 severe obesity with serious comorbidity and body mass index (BMI) of 45.0 to 49.9 in adult, unspecified obesity type ([place])  PLAN: Diabetes II Tomasina has been given extensive diabetes education by myself today including ideal fasting and post-prandial blood glucose readings, individual ideal HgA1c goals  and hypoglycemia prevention. We discussed the importance of good blood sugar control to decrease the likelihood of diabetic complications such as nephropathy, neuropathy, limb loss, blindness, coronary artery disease, and death. We discussed the importance of intensive lifestyle modification including diet, exercise and weight loss as the first line treatment for diabetes. Lashandra agrees to continue her metformin BID and check BGS twice weekly fasting and twice weekly 2 hours postprandial and will follow up at the  agreed upon time.  Vitamin D Deficiency Sally [name] was informed that low vitamin D levels contributes to fatigue and are associated with obesity, breast, and colon cancer. She agrees to continue to take prescription Vit D @50 ,000 IU every week and will  follow up for routine testing of vitamin D, at least 2-3 times per year. She was informed of the risk of over-replacement of vitamin D and agrees to not increase her dose unless she discusses this with [name] first. Agrees to follow up with our clinic as directed.   Depression with Emotional Eating Behaviors We discussed behavior modification techniques today to help Adelie deal with her emotional eating and depression. Discussed strategies for emotional eating and agreed to follow up as directed.  Obesity Seraphim is currently in the action stage of change. As such, her goal is to continue with weight loss efforts She has agreed to follow the Category 3 plan  We discussed her eating all food that is on the plan.  Carmina has been instructed to continue [place] for 60 minutes 2 time per week for weight loss and overall health benefits. We discussed the following Behavioral Modification Strategies today: increasing lean protein intake, planning for success, and work on meal planning and easy cooking plans   Sally [name] has agreed to follow up with our clinic in 2 weeks. She was informed of the importance of frequent follow up visits to maximize her success with intensive lifestyle modifications for her multiple health conditions.   OBESITY BEHAVIORAL INTERVENTION VISIT  Today's visit was # 3   Starting weight: 281 lb Starting date: 05/14/18 Today's weight : Weight: 272 lb (123.4 kg)  Today's date: 06/12/18 Total lbs lost to date: 9 lb At least 15 minutes were spent on discussing the following behavioral intervention visit.   ASK: We discussed the diagnosis of obesity with Sally [name] today and Amayrany agreed to give [name] permission to discuss obesity behavioral modification therapy today.  ASSESS: Sally [name] has the diagnosis of obesity and her BMI today is 46.67 Sally [name] is in the action stage of change   ADVISE: Sally [name] was educated on the multiple health risks of obesity as well as the benefit  of weight loss to improve her health. She was advised of the need for long term treatment and the importance of lifestyle modifications to improve her current health and to decrease her risk of future health problems.  AGREE: Multiple dietary modification options and treatment options were discussed and  Sally [name] agreed to follow the recommendations documented in the above note.  ARRANGE: Bryelle was educated on the importance of frequent visits to treat obesity as outlined per CMS and USPSTF guidelines and agreed to schedule her next follow up appointment today.  I, Renee Ramus, am acting as Location manager for [employer], [place].  I have reviewed the above documentation for accuracy and completeness, and I agree with the above.  - Sally Janelle, FNP-C.

## 2018-06-18 ENCOUNTER — Encounter (INDEPENDENT_AMBULATORY_CARE_PROVIDER_SITE_OTHER): Payer: Self-pay | Admitting: Family Medicine

## 2018-06-19 ENCOUNTER — Other Ambulatory Visit (INDEPENDENT_AMBULATORY_CARE_PROVIDER_SITE_OTHER): Payer: Self-pay | Admitting: Family Medicine

## 2018-06-19 DIAGNOSIS — E1165 Type 2 diabetes mellitus with hyperglycemia: Secondary | ICD-10-CM

## 2018-06-25 ENCOUNTER — Ambulatory Visit (INDEPENDENT_AMBULATORY_CARE_PROVIDER_SITE_OTHER): Payer: Medicare HMO | Admitting: Family Medicine

## 2018-06-25 ENCOUNTER — Encounter (INDEPENDENT_AMBULATORY_CARE_PROVIDER_SITE_OTHER): Payer: Self-pay | Admitting: Family Medicine

## 2018-06-25 VITALS — BP 132/79 | HR 87 | Temp 98.1°F | Ht 64.0 in | Wt 269.0 lb

## 2018-06-25 DIAGNOSIS — Z6841 Body Mass Index (BMI) 40.0 and over, adult: Secondary | ICD-10-CM

## 2018-06-25 DIAGNOSIS — E119 Type 2 diabetes mellitus without complications: Secondary | ICD-10-CM

## 2018-06-25 NOTE — Progress Notes (Signed)
Office: [phone]  /  Fax: [phone]   HPI:   Chief Complaint: OBESITY Sally [name] is here to discuss her progress with her obesity treatment plan. She is on the Category 3 plan and is following her eating plan approximately 80% of the time. She states she is doing tai chi for 60 minutes 3-4 times per week. Sally [name] is eating all of her food on the plan.  Her weight is 269 lb (122 kg) today and has had a weight loss of 3 pounds over a period of 2 weeks since her last visit. She has lost 12 lbs since starting treatment with [name].  Diabetes II Sally [name] has a diagnosis of diabetes type II. Sally [name] is on metformin and denies hypoglycemia. She notes diarrhea with metformin and with increased carbohydrates. She states fasting BGs range between 90 and 160, and 2 hour post prandials range between 100 and 166, average BG at 116. Last A1c was 6.8. She has been working on intensive lifestyle modifications including diet, exercise, and weight loss to help control her blood glucose levels.  ALLERGIES: No Known Allergies  MEDICATIONS: Current Outpatient Medications on File Prior to Visit  Medication Sig Dispense Refill  . albuterol (PROVENTIL HFA;VENTOLIN HFA) 108 (90 BASE) MCG/ACT inhaler Inhale 2 puffs into the lungs every 6 (six) hours as needed. FOR WHEEZING     . beclomethasone (QVAR) 80 MCG/ACT inhaler Inhale 2 puffs into the lungs 2 (two) times daily.    . blood glucose meter kit and supplies Dispense based on patient and insurance preference. Use up to four times daily as directed. (FOR ICD-10 E10.9, E11.9). Test twice daily 1 each 0  . cyclobenzaprine (FLEXERIL) 10 MG tablet Take 10 mg by mouth 3 (three) times daily as needed for muscle spasms.    . DULoxetine (CYMBALTA) 30 MG capsule Take 30 mg by mouth 3 (three) times daily.    . folic acid (FOLVITE) 1 MG tablet Take 1 mg by mouth at bedtime.   0  . furosemide (LASIX) 20 MG tablet Take 20 mg by mouth daily as needed.    . gabapentin (NEURONTIN)  300 MG capsule Take 300 mg by mouth at bedtime.  0  . meloxicam (MOBIC) 15 MG tablet Take 15 mg by mouth at bedtime.  3  . metFORMIN (GLUCOPHAGE-XR) 500 MG 24 hr tablet Take 1 tablet (500 mg total) by mouth 2 (two) times daily. 60 tablet 0  . methotrexate (RHEUMATREX) 2.5 MG tablet Take 2.5 mg by mouth once a week. Take 8 tablets by mouth once a week on Friday.  3  . montelukast (SINGULAIR) 10 MG tablet Take 10 mg by mouth at bedtime.    . Multiple Vitamins-Minerals (HAIR SKIN & NAILS ADVANCED PO) Take 2 tablets by mouth 2 (two) times daily.    . mupirocin ointment (BACTROBAN) 2 % Place 1 application into the nose 2 (two) times daily as needed.    . traMADol (ULTRAM) 50 MG tablet Take 50 mg by mouth 2 (two) times daily as needed for moderate pain.     . Vitamin D, Ergocalciferol, (DRISDOL) 50000 units CAPS capsule Take 1 capsule (50,000 Units total) by mouth every 3 (three) days. On Friday 10 capsule 0   No current facility-administered medications on file prior to visit.     PAST MEDICAL HISTORY: Past Medical History:  Diagnosis Date  . ADD (attention deficit disorder)   . Anemia   . Anxiety   . Arthritis   . Asthma   .  Back pain   . CFS (chronic fatigue syndrome)   . Colitis   . Depression   . Diabetes ([place])   . Dyspnea   . Fibromyalgia   . GERD (gastroesophageal reflux disease)   . Hx of blood clots   . IBS (irritable bowel syndrome)   . Joint pain   . Left knee pain   . Leg edema   . Osteoarthritis   . Prediabetes   . Pulmonary embolism ([place])   . RA (rheumatoid arthritis) ([place])   . RLS (restless legs syndrome)   . Sleep apnea   . Vitamin B 12 deficiency   . Vitamin D deficiency     PAST SURGICAL HISTORY: Past Surgical History:  Procedure Laterality Date  . EYE SURGERY    . MOUTH SURGERY      SOCIAL HISTORY: Social History   Tobacco Use  . Smoking status: Former Smoker    Packs/day: 1.50    Years: 8.00    Pack years: 12.00  . Smokeless tobacco: Never Used   Substance Use Topics  . Alcohol use: No    Alcohol/week: 0.0 standard drinks  . Drug use: No    FAMILY HISTORY: Family History  Problem Relation Age of Onset  . Pneumonia Mother   . Asthma Mother   . Diabetes Mother   . Hypertension Mother   . Depression Mother   . Anxiety disorder Mother   . Eating disorder Mother   . Obesity Mother   . Sudden death Mother   . Cancer Father        Brain tumor  . Asthma Father     ROS: Review of Systems  Constitutional: Positive for weight loss.  Gastrointestinal: Positive for diarrhea.  Endo/Heme/Allergies:       Negative hypoglycemia    PHYSICAL EXAM: Blood pressure 132/79, pulse 87, temperature 98.1 F (36.7 C), temperature source Oral, height _0  (1.626 m), weight 269 lb (122 kg), last menstrual period 05/31/2012, SpO2 97 %. Body mass index is 46.17 kg/m. Physical Exam  Constitutional: She is oriented to person, place, and time. She appears well-developed and well-nourished.  Cardiovascular: Normal rate.  Pulmonary/Chest: Effort normal.  Musculoskeletal: Normal range of motion.  Neurological: She is oriented to person, place, and time.  Skin: Skin is warm and dry.  Psychiatric: She has a normal mood and affect. Her behavior is normal.  Vitals reviewed.   RECENT LABS AND TESTS: BMET    Component Value Date/Time   NA 138 05/14/2018 0941   K 4.9 05/14/2018 0941   CL 99 05/14/2018 0941   CO2 24 05/14/2018 0941   GLUCOSE 123 (H) 05/14/2018 0941   GLUCOSE 130 (H) 11/06/2017 0222   BUN 15 05/14/2018 0941   CREATININE 0.73 05/14/2018 0941   CALCIUM 9.1 05/14/2018 0941   GFRNONAA 90 05/14/2018 0941   GFRAA 104 05/14/2018 0941   Lab Results  Component Value Date   HGBA1C 6.8 (H) 05/14/2018   Lab Results  Component Value Date   INSULIN 9.8 05/14/2018   CBC    Component Value Date/Time   WBC 8.0 05/14/2018 0941   WBC 10.1 11/06/2017 0222   RBC 3.97 05/14/2018 0941   RBC 4.31 11/06/2017 0222   HGB 12.0  05/14/2018 0941   HCT 37.4 05/14/2018 0941   PLT 380 11/06/2017 0222   MCV 94 05/14/2018 0941   MCH 30.2 05/14/2018 0941   MCH 29.0 11/06/2017 0222   MCHC 32.1 05/14/2018 0941   MCHC 32.3  11/06/2017 0222   RDW 14.1 05/14/2018 0941   LYMPHSABS 1.9 05/14/2018 0941   MONOABS 0.6 11/06/2017 0222   EOSABS 0.2 05/14/2018 0941   BASOSABS 0.1 05/14/2018 0941   Iron/TIBC/Ferritin/ %Sat No results found for: IRON, TIBC, FERRITIN, IRONPCTSAT Lipid Panel     Component Value Date/Time   CHOL 160 05/14/2018 0941   TRIG 163 (H) 05/14/2018 0941   HDL 50 05/14/2018 0941   LDLCALC 77 05/14/2018 0941   Hepatic Function Panel     Component Value Date/Time   PROT 7.0 05/14/2018 0941   ALBUMIN 4.4 05/14/2018 0941   AST 18 05/14/2018 0941   ALT 23 05/14/2018 0941   ALKPHOS 79 05/14/2018 0941   BILITOT 0.3 05/14/2018 0941      Component Value Date/Time   TSH 5.150 (H) 05/14/2018 0941    ASSESSMENT AND PLAN: Type 2 diabetes mellitus without complication, without long-term current use of insulin (HCC)  Class 3 severe obesity with serious comorbidity and body mass index (BMI) of 45.0 to 49.9 in adult, unspecified obesity type ([place])  PLAN:  Diabetes II Gudrun has been given extensive diabetes education by myself today including ideal fasting and post-prandial blood glucose readings, individual ideal Hgb A1c goals and hypoglycemia prevention. We discussed the importance of good blood sugar control to decrease the likelihood of diabetic complications such as nephropathy, neuropathy, limb loss, blindness, coronary artery disease, and death. We discussed the importance of intensive lifestyle modification including diet, exercise and weight loss as the first line treatment for diabetes. Aparna agrees to continue taking metformin, and will continue diet and exercise. Karolee agrees to follow up with our clinic in 2 weeks.  Obesity Demita is currently in the action stage of change. As such, her goal  is to continue with weight loss efforts She has agreed to follow the Category 3 plan Vianca has been instructed to continue tai chi for 60 minutes 3-4 times per week. Alexcia is eating all of her food on the plan for weight loss and overall health benefits. We discussed the following Behavioral Modification Strategies today: holiday eating strategies and better snacking choices   Hadja has agreed to follow up with our clinic in 2 weeks. She was informed of the importance of frequent follow up visits to maximize her success with intensive lifestyle modifications for her multiple health conditions.   OBESITY BEHAVIORAL INTERVENTION VISIT  Today's visit was # 4  Starting weight: 281 lbs Starting date: 05/14/18 Today's weight : 269 lbs  Today's date: 06/25/2018 Total lbs lost to date: 12 At least 15 minutes were spent on discussing the following behavioral intervention visit.   ASK: We discussed the diagnosis of obesity with Sally [name] today and Dorethea agreed to give [name] permission to discuss obesity behavioral modification therapy today.  ASSESS: Crystalmarie has the diagnosis of obesity and her BMI today is 46.15 Carlisia is in the action stage of change   ADVISE: Kingsley was educated on the multiple health risks of obesity as well as the benefit of weight loss to improve her health. She was advised of the need for long term treatment and the importance of lifestyle modifications to improve her current health and to decrease her risk of future health problems.  AGREE: Multiple dietary modification options and treatment options were discussed and  Rashawna agreed to follow the recommendations documented in the above note.  ARRANGE: Jennie was educated on the importance of frequent visits to treat obesity as outlined per CMS and USPSTF  guidelines and agreed to schedule her next follow up appointment today.  Wilhemena Durie, am acting as Location manager for [employer], FNP-C.  I have  reviewed the above documentation for accuracy and completeness, and I agree with the above.  - Jimmie Dattilio, FNP-C.

## 2018-06-26 ENCOUNTER — Encounter (INDEPENDENT_AMBULATORY_CARE_PROVIDER_SITE_OTHER): Payer: Self-pay | Admitting: Family Medicine

## 2018-07-10 ENCOUNTER — Encounter (INDEPENDENT_AMBULATORY_CARE_PROVIDER_SITE_OTHER): Payer: Self-pay

## 2018-07-10 ENCOUNTER — Ambulatory Visit (INDEPENDENT_AMBULATORY_CARE_PROVIDER_SITE_OTHER): Payer: Medicare HMO | Admitting: Family Medicine

## 2018-07-11 ENCOUNTER — Ambulatory Visit (INDEPENDENT_AMBULATORY_CARE_PROVIDER_SITE_OTHER): Payer: Medicare HMO | Admitting: Family Medicine

## 2018-07-18 ENCOUNTER — Encounter (INDEPENDENT_AMBULATORY_CARE_PROVIDER_SITE_OTHER): Payer: Self-pay | Admitting: Physician Assistant

## 2018-07-18 ENCOUNTER — Ambulatory Visit (INDEPENDENT_AMBULATORY_CARE_PROVIDER_SITE_OTHER): Payer: Medicare HMO | Admitting: Physician Assistant

## 2018-07-18 VITALS — BP 131/80 | HR 87 | Temp 98.1°F | Ht 64.0 in | Wt 266.0 lb

## 2018-07-18 DIAGNOSIS — E559 Vitamin D deficiency, unspecified: Secondary | ICD-10-CM

## 2018-07-18 DIAGNOSIS — E1165 Type 2 diabetes mellitus with hyperglycemia: Secondary | ICD-10-CM

## 2018-07-18 DIAGNOSIS — Z6841 Body Mass Index (BMI) 40.0 and over, adult: Secondary | ICD-10-CM | POA: Diagnosis not present

## 2018-07-18 MED ORDER — METFORMIN HCL ER 500 MG PO TB24: 500.0000 mg | ORAL_TABLET | Freq: Two times a day (BID) | ORAL | 0 refills | Status: DC

## 2018-07-18 NOTE — Progress Notes (Signed)
Office: [phone]  /  Fax: [phone]   HPI:   Chief Complaint: OBESITY Sally [name] is here to discuss her progress with her obesity treatment plan. She is on the Category 3 plan and is following her eating plan approximately 80 % of the time. She states she is doing Tai Chi 60 minutes 3 times per week. Sally [name] did very well with weight loss. She reports that she has been portion controlling her food during the holidays. Her weight is 266 lb (120.7 kg) today and has had a weight loss of 3 pounds over a period of 3 weeks since her last visit. She has lost 15 lbs since starting treatment with [name].  Diabetes II Sally [name] has a diagnosis of diabetes type II. She is currently taking metformin. Sally [name] reports diarrhea if she eats high carbohydrate foods. She denies any nausea or vomiting. Sally [name] has not been checking her blood sugars the last few weeks. Last A1c was at 6.8 on 07/02/18 She has been working on intensive lifestyle modifications including diet, exercise, and weight loss to help control her blood glucose levels.  Vitamin D deficiency Sally [name] has a diagnosis of vitamin D deficiency. She is currently taking vit D and denies nausea, vomiting or muscle weakness.  ASSESSMENT AND PLAN:  Type 2 diabetes mellitus with hyperglycemia, without long-term current use of insulin (HCC) - Plan: metFORMIN (GLUCOPHAGE-XR) 500 MG 24 hr tablet  Vitamin D deficiency  Class 3 severe obesity with serious comorbidity and body mass index (BMI) of 45.0 to 49.9 in adult, unspecified obesity type ([place])  PLAN:  Diabetes II Sally [name] has been given extensive diabetes education by myself today including ideal fasting and post-prandial blood glucose readings, individual ideal Hgb A1c goals and hypoglycemia prevention. We discussed the importance of good blood sugar control to decrease the likelihood of diabetic complications such as nephropathy, neuropathy, limb loss, blindness, coronary artery disease, and death. We  discussed the importance of intensive lifestyle modification including diet, exercise and weight loss as the first line treatment for diabetes. Sally [name] agrees to continue metformin 500 mg BID #60 with no refills and follow up at the agreed upon time.  Vitamin D Deficiency Sally [name] was informed that low vitamin D levels contributes to fatigue and are associated with obesity, breast, and colon cancer. She agrees to continue to take prescription Vit D _0 ,000 IU every week and will follow up for routine testing of vitamin D, at least 2-3 times per year. She was informed of the risk of over-replacement of vitamin D and agrees to not increase her dose unless she discusses this with [name] first.  Obesity Sally [name] is currently in the action stage of change. As such, her goal is to continue with weight loss efforts She has agreed to follow the Category 3 plan Sally [name] has been instructed to work up to a goal of 150 minutes of combined cardio and strengthening exercise per week for weight loss and overall health benefits. We discussed the following Behavioral Modification Strategies today: work on meal planning and easy cooking plans and holiday eating strategies   Sally [name] has agreed to follow up with our clinic in 3 weeks. She was informed of the importance of frequent follow up visits to maximize her success with intensive lifestyle modifications for her multiple health conditions.  ALLERGIES: No Known Allergies  MEDICATIONS: Current Outpatient Medications on File Prior to Visit  Medication Sig Dispense Refill  . albuterol (PROVENTIL HFA;VENTOLIN HFA) 108 (90 BASE) MCG/ACT inhaler Inhale 2 puffs into the lungs  every 6 (six) hours as needed. FOR WHEEZING     . beclomethasone (QVAR) 80 MCG/ACT inhaler Inhale 2 puffs into the lungs 2 (two) times daily.    . blood glucose meter kit and supplies Dispense based on patient and insurance preference. Use up to four times daily as directed. (FOR ICD-10 E10.9,  E11.9). Test twice daily 1 each 0  . cyclobenzaprine (FLEXERIL) 10 MG tablet Take 10 mg by mouth 3 (three) times daily as needed for muscle spasms.    . DULoxetine (CYMBALTA) 30 MG capsule Take 30 mg by mouth 3 (three) times daily.    . folic acid (FOLVITE) 1 MG tablet Take 1 mg by mouth at bedtime.   0  . furosemide (LASIX) 20 MG tablet Take 20 mg by mouth daily as needed.    . gabapentin (NEURONTIN) 300 MG capsule Take 300 mg by mouth at bedtime.  0  . meloxicam (MOBIC) 15 MG tablet Take 15 mg by mouth at bedtime.  3  . methotrexate (RHEUMATREX) 2.5 MG tablet Take 2.5 mg by mouth once a week. Take 8 tablets by mouth once a week on Friday.  3  . montelukast (SINGULAIR) 10 MG tablet Take 10 mg by mouth at bedtime.    . Multiple Vitamins-Minerals (HAIR SKIN & NAILS ADVANCED PO) Take 2 tablets by mouth 2 (two) times daily.    . mupirocin ointment (BACTROBAN) 2 % Place 1 application into the nose 2 (two) times daily as needed.    . predniSONE (DELTASONE) 10 MG tablet Take 10 mg by mouth daily with breakfast.    . traMADol (ULTRAM) 50 MG tablet Take 50 mg by mouth 2 (two) times daily as needed for moderate pain.     . Vitamin D, Ergocalciferol, (DRISDOL) 50000 units CAPS capsule Take 1 capsule (50,000 Units total) by mouth every 3 (three) days. On Friday 10 capsule 0   No current facility-administered medications on file prior to visit.     PAST MEDICAL HISTORY: Past Medical History:  Diagnosis Date  . ADD (attention deficit disorder)   . Anemia   . Anxiety   . Arthritis   . Asthma   . Back pain   . CFS (chronic fatigue syndrome)   . Colitis   . Depression   . Diabetes ([place])   . Dyspnea   . Fibromyalgia   . GERD (gastroesophageal reflux disease)   . Hx of blood clots   . IBS (irritable bowel syndrome)   . Joint pain   . Left knee pain   . Leg edema   . Osteoarthritis   . Prediabetes   . Pulmonary embolism ([place])   . RA (rheumatoid arthritis) ([place])   . RLS (restless legs  syndrome)   . Sleep apnea   . Vitamin B 12 deficiency   . Vitamin D deficiency     PAST SURGICAL HISTORY: Past Surgical History:  Procedure Laterality Date  . EYE SURGERY    . MOUTH SURGERY      SOCIAL HISTORY: Social History   Tobacco Use  . Smoking status: Former Smoker    Packs/day: 1.50    Years: 8.00    Pack years: 12.00  . Smokeless tobacco: Never Used  Substance Use Topics  . Alcohol use: No    Alcohol/week: 0.0 standard drinks  . Drug use: No    FAMILY HISTORY: Family History  Problem Relation Age of Onset  . Pneumonia Mother   . Asthma Mother   . Diabetes  Mother   . Hypertension Mother   . Depression Mother   . Anxiety disorder Mother   . Eating disorder Mother   . Obesity Mother   . Sudden death Mother   . Cancer Father        Brain tumor  . Asthma Father     ROS: Review of Systems  Constitutional: Positive for weight loss.  Gastrointestinal: Positive for diarrhea. Negative for nausea and vomiting.  Musculoskeletal:       Negative for muscle weakness    PHYSICAL EXAM: Blood pressure 131/80, pulse 87, temperature 98.1 F (36.7 C), temperature source Oral, height _0  (1.626 m), weight 266 lb (120.7 kg), last menstrual period 05/31/2012, SpO2 100 %. Body mass index is 45.66 kg/m. Physical Exam Vitals signs reviewed.  Constitutional:      Appearance: Normal appearance. She is well-developed. She is obese.  Cardiovascular:     Rate and Rhythm: Normal rate.  Pulmonary:     Effort: Pulmonary effort is normal.  Musculoskeletal: Normal range of motion.  Skin:    General: Skin is warm and dry.  Neurological:     Mental Status: She is alert and oriented to person, place, and time.  Psychiatric:        Mood and Affect: Mood normal.        Behavior: Behavior normal.     RECENT LABS AND TESTS: BMET    Component Value Date/Time   NA 138 05/14/2018 0941   K 4.9 05/14/2018 0941   CL 99 05/14/2018 0941   CO2 24 05/14/2018 0941   GLUCOSE  123 (H) 05/14/2018 0941   GLUCOSE 130 (H) 11/06/2017 0222   BUN 15 05/14/2018 0941   CREATININE 0.73 05/14/2018 0941   CALCIUM 9.1 05/14/2018 0941   GFRNONAA 90 05/14/2018 0941   GFRAA 104 05/14/2018 0941   Lab Results  Component Value Date   HGBA1C 6.8 (H) 05/14/2018   Lab Results  Component Value Date   INSULIN 9.8 05/14/2018   CBC    Component Value Date/Time   WBC 8.0 05/14/2018 0941   WBC 10.1 11/06/2017 0222   RBC 3.97 05/14/2018 0941   RBC 4.31 11/06/2017 0222   HGB 12.0 05/14/2018 0941   HCT 37.4 05/14/2018 0941   PLT 380 11/06/2017 0222   MCV 94 05/14/2018 0941   MCH 30.2 05/14/2018 0941   MCH 29.0 11/06/2017 0222   MCHC 32.1 05/14/2018 0941   MCHC 32.3 11/06/2017 0222   RDW 14.1 05/14/2018 0941   LYMPHSABS 1.9 05/14/2018 0941   MONOABS 0.6 11/06/2017 0222   EOSABS 0.2 05/14/2018 0941   BASOSABS 0.1 05/14/2018 0941   Iron/TIBC/Ferritin/ %Sat No results found for: IRON, TIBC, FERRITIN, IRONPCTSAT Lipid Panel     Component Value Date/Time   CHOL 160 05/14/2018 0941   TRIG 163 (H) 05/14/2018 0941   HDL 50 05/14/2018 0941   LDLCALC 77 05/14/2018 0941   Hepatic Function Panel     Component Value Date/Time   PROT 7.0 05/14/2018 0941   ALBUMIN 4.4 05/14/2018 0941   AST 18 05/14/2018 0941   ALT 23 05/14/2018 0941   ALKPHOS 79 05/14/2018 0941   BILITOT 0.3 05/14/2018 0941      Component Value Date/Time   TSH 5.150 (H) 05/14/2018 0941    Ref. Range 05/14/2018 09:41  Vitamin D, 25-Hydroxy Latest Ref Range: 30.0 - 100.0 ng/mL 27.9 (L)     OBESITY BEHAVIORAL INTERVENTION VISIT  Today's visit was # 5   Starting weight:  281 lbs Starting date: 05/14/2018 Today's weight : 266 lbs Today's date: 07/18/2018 Total lbs lost to date: 15 At least 15 minutes were spent on discussing the following behavioral intervention visit.   ASK: We discussed the diagnosis of obesity with Sally [name] today and Sally [name] agreed to give [name] permission to discuss  obesity behavioral modification therapy today.  ASSESS: Sally [name] has the diagnosis of obesity and her BMI today is 45.64 Sally [name] is in the action stage of change   ADVISE: Sally [name] was educated on the multiple health risks of obesity as well as the benefit of weight loss to improve her health. She was advised of the need for long term treatment and the importance of lifestyle modifications to improve her current health and to decrease her risk of future health problems.  AGREE: Multiple dietary modification options and treatment options were discussed and  Sally [name] agreed to follow the recommendations documented in the above note.  ARRANGE: Sally [name] was educated on the importance of frequent visits to treat obesity as outlined per CMS and USPSTF guidelines and agreed to schedule her next follow up appointment today.  Corey Skains, am acting as transcriptionist for Abby Potash, PA-C I, Abby Potash, PA-C have reviewed above note and agree with its content

## 2018-07-19 ENCOUNTER — Other Ambulatory Visit (INDEPENDENT_AMBULATORY_CARE_PROVIDER_SITE_OTHER): Payer: Self-pay | Admitting: Family Medicine

## 2018-08-07 ENCOUNTER — Encounter (INDEPENDENT_AMBULATORY_CARE_PROVIDER_SITE_OTHER): Payer: Self-pay | Admitting: Physician Assistant

## 2018-08-07 ENCOUNTER — Ambulatory Visit (INDEPENDENT_AMBULATORY_CARE_PROVIDER_SITE_OTHER): Payer: Medicare Other | Admitting: Physician Assistant

## 2018-08-07 VITALS — BP 122/79 | HR 86 | Temp 97.9°F | Ht 64.0 in | Wt 263.0 lb

## 2018-08-07 DIAGNOSIS — E119 Type 2 diabetes mellitus without complications: Secondary | ICD-10-CM

## 2018-08-07 DIAGNOSIS — Z6841 Body Mass Index (BMI) 40.0 and over, adult: Secondary | ICD-10-CM | POA: Diagnosis not present

## 2018-08-08 NOTE — Progress Notes (Signed)
Office: [phone]  /  Fax: [phone]   HPI:   Chief Complaint: OBESITY Sally [name] is here to discuss her progress with her obesity treatment plan. She is on the Category 3 plan and is following her eating plan approximately 50 % of the time. She states she is doing cardio 30-60 minutes 3 times per week. Sally [name] did well with weight loss. She reports that she is not eating bread at times. Her hunger is controlled. Her weight is 263 lb (119.3 kg) today and has had a weight loss of 3 pounds over a period of 3 weeks since her last visit. She has lost 18 lbs since starting treatment with [name].  Diabetes II Sally [name] has a diagnosis of diabetes type II. Sally [name] states BGs range between 115 and 170's and denies any hypoglycemic episodes. She is currently taking Metformin. She denies polyphagia.   Last A1c was Hemoglobin A1C Latest Ref Rng & Units 05/14/2018  HGBA1C 4.8 - 5.6 % 6.8(H)  Some recent data might be hidden    She has been working on intensive lifestyle modifications including diet, exercise, and weight loss to help control her blood glucose levels.  ASSESSMENT AND PLAN:  Type 2 diabetes mellitus without complication, without long-term current use of insulin (HCC)  Class 3 severe obesity with serious comorbidity and body mass index (BMI) of 45.0 to 49.9 in adult, unspecified obesity type ([place])  PLAN:  Diabetes II Sally [name] has been given extensive diabetes education by myself today including ideal fasting and post-prandial blood glucose readings, individual ideal Hgb A1c goals and hypoglycemia prevention. We discussed the importance of good blood sugar control to decrease the likelihood of diabetic complications such as nephropathy, neuropathy, limb loss, blindness, coronary artery disease, and death. We discussed the importance of intensive lifestyle modification including diet, exercise and weight loss as the first line treatment for diabetes. Sally [name] agrees to continue taking Metfomin  and will follow up with our clinic in 2 weeks.  I spent > than 50% of the 15 minute visit on counseling as documented in the note.  Obesity Sally [name] is currently in the action stage of change. As such, her goal is to continue with weight loss efforts She has agreed to follow the Category 3 plan Sally [name] has been instructed to work up to a goal of 150 minutes of combined cardio and strengthening exercise per week for weight loss and overall health benefits. We discussed the following Behavioral Modification Strategies today: work on meal planning and cooking strategies and keeping healthy foods in the home.   Sally [name] has agreed to follow up with our clinic in 2 weeks. She was informed of the importance of frequent follow up visits to maximize her success with intensive lifestyle modifications for her multiple health conditions.  ALLERGIES: No Known Allergies  MEDICATIONS: Current Outpatient Medications on File Prior to Visit  Medication Sig Dispense Refill  . albuterol (PROVENTIL HFA;VENTOLIN HFA) 108 (90 BASE) MCG/ACT inhaler Inhale 2 puffs into the lungs every 6 (six) hours as needed. FOR WHEEZING     . beclomethasone (QVAR) 80 MCG/ACT inhaler Inhale 2 puffs into the lungs 2 (two) times daily.    . blood glucose meter kit and supplies Dispense based on patient and insurance preference. Use up to four times daily as directed. (FOR ICD-10 E10.9, E11.9). Test twice daily 1 each 0  . cyclobenzaprine (FLEXERIL) 10 MG tablet Take 10 mg by mouth 3 (three) times daily as needed for muscle spasms.    . DULoxetine (  CYMBALTA) 30 MG capsule Take 30 mg by mouth 3 (three) times daily.    . folic acid (FOLVITE) 1 MG tablet Take 1 mg by mouth at bedtime.   0  . furosemide (LASIX) 20 MG tablet Take 20 mg by mouth daily as needed.    . gabapentin (NEURONTIN) 300 MG capsule Take 300 mg by mouth at bedtime.  0  . meloxicam (MOBIC) 15 MG tablet Take 15 mg by mouth at bedtime.  3  . metFORMIN (GLUCOPHAGE-XR) 500  MG 24 hr tablet Take 1 tablet (500 mg total) by mouth 2 (two) times daily. 60 tablet 0  . methotrexate (RHEUMATREX) 2.5 MG tablet Take 2.5 mg by mouth once a week. Take 8 tablets by mouth once a week on Friday.  3  . montelukast (SINGULAIR) 10 MG tablet Take 10 mg by mouth at bedtime.    . Multiple Vitamins-Minerals (HAIR SKIN & NAILS ADVANCED PO) Take 2 tablets by mouth 2 (two) times daily.    . mupirocin ointment (BACTROBAN) 2 % Place 1 application into the nose 2 (two) times daily as needed.    . predniSONE (DELTASONE) 10 MG tablet Take 10 mg by mouth daily with breakfast.    . traMADol (ULTRAM) 50 MG tablet Take 50 mg by mouth 2 (two) times daily as needed for moderate pain.     . Vitamin D, Ergocalciferol, (DRISDOL) 50000 units CAPS capsule Take 1 capsule (50,000 Units total) by mouth every 3 (three) days. On Friday 10 capsule 0   No current facility-administered medications on file prior to visit.     PAST MEDICAL HISTORY: Past Medical History:  Diagnosis Date  . ADD (attention deficit disorder)   . Anemia   . Anxiety   . Arthritis   . Asthma   . Back pain   . CFS (chronic fatigue syndrome)   . Colitis   . Depression   . Diabetes ([place])   . Dyspnea   . Fibromyalgia   . GERD (gastroesophageal reflux disease)   . Hx of blood clots   . IBS (irritable bowel syndrome)   . Joint pain   . Left knee pain   . Leg edema   . Osteoarthritis   . Prediabetes   . Pulmonary embolism ([place])   . RA (rheumatoid arthritis) ([place])   . RLS (restless legs syndrome)   . Sleep apnea   . Vitamin B 12 deficiency   . Vitamin D deficiency     PAST SURGICAL HISTORY: Past Surgical History:  Procedure Laterality Date  . EYE SURGERY    . MOUTH SURGERY      SOCIAL HISTORY: Social History   Tobacco Use  . Smoking status: Former Smoker    Packs/day: 1.50    Years: 8.00    Pack years: 12.00  . Smokeless tobacco: Never Used  Substance Use Topics  . Alcohol use: No    Alcohol/week: 0.0  standard drinks  . Drug use: No    FAMILY HISTORY: Family History  Problem Relation Age of Onset  . Pneumonia Mother   . Asthma Mother   . Diabetes Mother   . Hypertension Mother   . Depression Mother   . Anxiety disorder Mother   . Eating disorder Mother   . Obesity Mother   . Sudden death Mother   . Cancer Father        Brain tumor  . Asthma Father     ROS: Review of Systems  Constitutional: Positive for weight  loss.  Gastrointestinal: Negative for diarrhea, nausea and vomiting.  Endo/Heme/Allergies:       Negative for polyphagia Negative for hypoglycemia    PHYSICAL EXAM: Blood pressure 122/79, pulse 86, temperature 97.9 F (36.6 C), temperature source Oral, height 5' 4"  (1.626 m), weight 263 lb (119.3 kg), last menstrual period 05/31/2012, SpO2 96 %. Body mass index is 45.14 kg/m. Physical Exam Vitals signs reviewed.  Constitutional:      Appearance: Normal appearance. She is obese.  Cardiovascular:     Rate and Rhythm: Normal rate.     Pulses: Normal pulses.  Pulmonary:     Effort: Pulmonary effort is normal.  Musculoskeletal: Normal range of motion.  Skin:    General: Skin is warm and dry.  Neurological:     Mental Status: She is alert and oriented to person, place, and time.  Psychiatric:        Mood and Affect: Mood normal.        Behavior: Behavior normal.     RECENT LABS AND TESTS: BMET    Component Value Date/Time   NA 138 05/14/2018 0941   K 4.9 05/14/2018 0941   CL 99 05/14/2018 0941   CO2 24 05/14/2018 0941   GLUCOSE 123 (H) 05/14/2018 0941   GLUCOSE 130 (H) 11/06/2017 0222   BUN 15 05/14/2018 0941   CREATININE 0.73 05/14/2018 0941   CALCIUM 9.1 05/14/2018 0941   GFRNONAA 90 05/14/2018 0941   GFRAA 104 05/14/2018 0941   Lab Results  Component Value Date   HGBA1C 6.8 (H) 05/14/2018   Lab Results  Component Value Date   INSULIN 9.8 05/14/2018   CBC    Component Value Date/Time   WBC 8.0 05/14/2018 0941   WBC 10.1  11/06/2017 0222   RBC 3.97 05/14/2018 0941   RBC 4.31 11/06/2017 0222   HGB 12.0 05/14/2018 0941   HCT 37.4 05/14/2018 0941   PLT 380 11/06/2017 0222   MCV 94 05/14/2018 0941   MCH 30.2 05/14/2018 0941   MCH 29.0 11/06/2017 0222   MCHC 32.1 05/14/2018 0941   MCHC 32.3 11/06/2017 0222   RDW 14.1 05/14/2018 0941   LYMPHSABS 1.9 05/14/2018 0941   MONOABS 0.6 11/06/2017 0222   EOSABS 0.2 05/14/2018 0941   BASOSABS 0.1 05/14/2018 0941   Iron/TIBC/Ferritin/ %Sat No results found for: IRON, TIBC, FERRITIN, IRONPCTSAT Lipid Panel     Component Value Date/Time   CHOL 160 05/14/2018 0941   TRIG 163 (H) 05/14/2018 0941   HDL 50 05/14/2018 0941   LDLCALC 77 05/14/2018 0941   Hepatic Function Panel     Component Value Date/Time   PROT 7.0 05/14/2018 0941   ALBUMIN 4.4 05/14/2018 0941   AST 18 05/14/2018 0941   ALT 23 05/14/2018 0941   ALKPHOS 79 05/14/2018 0941   BILITOT 0.3 05/14/2018 0941      Component Value Date/Time   TSH 5.150 (H) 05/14/2018 0941      OBESITY BEHAVIORAL INTERVENTION VISIT  Today's visit was # 6  Starting weight: 281 lbs Starting date: 05/14/2018 Today's weight : 263 lbs Today's date: 08/07/2018 Total lbs lost to date: 18  ASK: We discussed the diagnosis of obesity with Sally [name] today and Sally [name] agreed to give [name] permission to discuss obesity behavioral modification therapy today.  ASSESS: Sally [name] has the diagnosis of obesity and her BMI today is 45.12 Sally [name] is in the action stage of change   ADVISE: Sally [name] was educated on the multiple health risks of obesity  as well as the benefit of weight loss to improve her health. She was advised of the need for long term treatment and the importance of lifestyle modifications to improve her current health and to decrease her risk of future health problems.  AGREE: Multiple dietary modification options and treatment options were discussed and  Sally [name] agreed to follow the recommendations documented  in the above note.  ARRANGE: Sally [name] was educated on the importance of frequent visits to treat obesity as outlined per CMS and USPSTF guidelines and agreed to schedule her next follow up appointment today.  I, Tammy Wysor, am acting as Location manager for [employer] I, Abby Potash, PA-C have reviewed above note and agree with its content

## 2018-08-21 ENCOUNTER — Encounter (INDEPENDENT_AMBULATORY_CARE_PROVIDER_SITE_OTHER): Payer: Self-pay | Admitting: Physician Assistant

## 2018-08-21 ENCOUNTER — Ambulatory Visit (INDEPENDENT_AMBULATORY_CARE_PROVIDER_SITE_OTHER): Payer: Medicare Other | Admitting: Physician Assistant

## 2018-08-21 VITALS — BP 134/79 | HR 87 | Temp 97.8°F | Ht 64.0 in | Wt 266.0 lb

## 2018-08-21 DIAGNOSIS — Z6841 Body Mass Index (BMI) 40.0 and over, adult: Secondary | ICD-10-CM

## 2018-08-21 DIAGNOSIS — E559 Vitamin D deficiency, unspecified: Secondary | ICD-10-CM | POA: Diagnosis not present

## 2018-08-21 DIAGNOSIS — Z9189 Other specified personal risk factors, not elsewhere classified: Secondary | ICD-10-CM | POA: Diagnosis not present

## 2018-08-21 DIAGNOSIS — E1165 Type 2 diabetes mellitus with hyperglycemia: Secondary | ICD-10-CM | POA: Diagnosis not present

## 2018-08-21 MED ORDER — VITAMIN D (ERGOCALCIFEROL) 1.25 MG (50000 UNIT) PO CAPS: 50000.0000 [IU] | ORAL_CAPSULE | ORAL | 0 refills | Status: DC

## 2018-08-21 MED ORDER — METFORMIN HCL ER 500 MG PO TB24: 500.0000 mg | ORAL_TABLET | Freq: Two times a day (BID) | ORAL | 0 refills | Status: DC

## 2018-08-21 NOTE — Progress Notes (Signed)
Office: [phone]  /  Fax: [phone]   HPI:   Chief Complaint: OBESITY Sally [name] is here to discuss her progress with her obesity treatment plan. She is on the Category 3 plan Sally [name] is following her eating plan approximately 70 % of the time. She states she is doing [employer] 60 minutes 2 times per week. Sally [name] reports that she struggled with the plan recently due to her husband bringing girl scout cookies into the house. She is ready to get back on track. Her weight is 266 lb (120.7 kg) today Sally [name] has gained 3 lbs since her last visit. She has lost 15 lbs since starting treatment with [name].  Diabetes II with Peripheral neuropathy Sally [name] has a diagnosis of diabetes type II. Sally [name] states fasting BGs range between 105 Sally [name] 150 Sally [name] denies any hypoglycemic episodes. Last A1c was Hemoglobin A1C Latest Ref Rng & Units 05/14/2018  HGBA1C 4.8 - 5.6 % 6.8(H)  Some recent data might be hidden    She has been working on intensive lifestyle modifications including diet, exercise, Sally [name] weight loss to help control her blood glucose levels.  Vitamin D deficiency Sally [name] has a diagnosis of vitamin D deficiency. She is currently taking prescription Vit D Sally [name] denies nausea, vomiting or muscle weakness.  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    ASSESSMENT Sally [name] PLAN:  Type 2 diabetes mellitus with hyperglycemia, without long-term current use of insulin (Sally [name]) - Plan: metFORMIN (GLUCOPHAGE-XR) 500 MG 24 hr tablet  Vitamin D deficiency - Plan: Vitamin D, Ergocalciferol, (DRISDOL) 1.25 MG (50000 UT) CAPS capsule  At risk for osteoporosis  Class 3 severe obesity with serious comorbidity Sally [name] body mass index (BMI) of 45.0 to 49.9 in adult, unspecified obesity type (Sally [name])  PLAN:  Diabetes II with Peripheral neuropathy Sally [name] has been given extensive diabetes education by myself today including ideal fasting Sally [name] post-prandial  blood glucose readings, individual ideal Hgb A1c goals  Sally [name] hypoglycemia prevention. We discussed the importance of good blood sugar control to decrease the likelihood of diabetic complications such as nephropathy, neuropathy, limb loss, blindness, coronary artery disease, Sally [name] death. We discussed the importance of intensive lifestyle modification including diet, exercise Sally [name] weight loss as the first line treatment for diabetes. Sally [name] agrees to continue to take metformin 500 mg bid #180 with no refills Sally [name] will follow up with our clinic in 2 weeks.  Vitamin D Deficiency Sally [name] was informed that low vitamin D levels contributes to fatigue Sally [name] are associated with obesity, breast, Sally [name] colon cancer. She agrees to continue to take prescription Vit D @50 ,000 IU every 3 days #10 with no refills Sally [name] will follow up for routine testing of vitamin D, at least 2-3 times per year. She was informed of the risk of over-replacement of vitamin D Sally [name] agrees to not increase her dose unless she discusses this with [name] first. Sally [name] agrees to follow up with our clinic in 2 weeks.  At risk for osteopenia Sally [name] osteoporosis Sally [name] was given extended  (15 minutes) osteoporosis prevention counseling today. Sally [name] is at risk for osteopenia Sally [name] osteoporosis due to her vitamin D deficiency. She was encouraged to take her vitamin D Sally [name] follow her higher calcium diet Sally [name] increase strengthening exercise to help strengthen her bones Sally [name] decrease her risk of osteopenia Sally [name] osteoporosis.  Obesity Sally [name] is currently in the action stage of change. As such, her goal is to continue with weight loss efforts  She has agreed to follow the Category 3 plan Sally [name] has been instructed to work up to a goal of 150 minutes of combined cardio Sally [name] strengthening exercise per week for weight loss Sally [name] overall health benefits. We discussed the following Behavioral Modification Strategies today: increasing lean protein intake Sally [name] work on meal planning Sally [name]  easy cooking plans  Sally [name] has agreed to follow up with our clinic in 2 weeks. She was informed of the importance of frequent follow up visits to maximize her success with intensive lifestyle modifications for her multiple health conditions.  ALLERGIES: No Known Allergies  MEDICATIONS: Current Outpatient Medications on File Prior to Visit  Medication Sig Dispense Refill  . albuterol (PROVENTIL HFA;VENTOLIN HFA) 108 (90 BASE) MCG/ACT inhaler Inhale 2 puffs into the lungs every 6 (six) hours as needed. FOR WHEEZING     . beclomethasone (QVAR) 80 MCG/ACT inhaler Inhale 2 puffs into the lungs 2 (two) times daily.    . blood glucose meter kit Sally [name] supplies Dispense based on patient Sally [name] insurance preference. Use up to four times daily as directed. (FOR ICD-10 E10.9, E11.9). Test twice daily 1 each 0  . cyclobenzaprine (FLEXERIL) 10 MG tablet Take 10 mg by mouth 3 (three) times daily as needed for muscle spasms.    . DULoxetine (CYMBALTA) 30 MG capsule Take 30 mg by mouth 3 (three) times daily.    . folic acid (FOLVITE) 1 MG tablet Take 1 mg by mouth at bedtime.   0  . furosemide (LASIX) 20 MG tablet Take 20 mg by mouth daily as needed.    . gabapentin (NEURONTIN) 300 MG capsule Take 300 mg by mouth at bedtime.  0  . meloxicam (MOBIC) 15 MG tablet Take 15 mg by mouth at bedtime.  3  . methotrexate (RHEUMATREX) 2.5 MG tablet Take 2.5 mg by mouth once a week. Take 8 tablets by mouth once a week on Friday.  3  . montelukast (SINGULAIR) 10 MG tablet Take 10 mg by mouth at bedtime.    . Multiple Vitamins-Minerals (HAIR SKIN & NAILS ADVANCED PO) Take 2 tablets by mouth 2 (two) times daily.    . mupirocin ointment (BACTROBAN) 2 % Place 1 application into the nose 2 (two) times daily as needed.    . predniSONE (DELTASONE) 10 MG tablet Take 10 mg by mouth daily with breakfast.    . traMADol (ULTRAM) 50 MG tablet Take 50 mg by mouth 2 (two) times daily as needed for moderate pain.      No current  facility-administered medications on file prior to visit.     PAST MEDICAL HISTORY: Past Medical History:  Diagnosis Date  . ADD (attention deficit disorder)   . Anemia   . Anxiety   . Arthritis   . Asthma   . Back pain   . CFS (chronic fatigue syndrome)   . Colitis   . Depression   . Diabetes ([place])   . Dyspnea   . Fibromyalgia   . GERD (gastroesophageal reflux disease)   . Hx of blood clots   . IBS (irritable bowel syndrome)   . Joint pain   . Left knee pain   . Leg edema   . Osteoarthritis   . Prediabetes   . Pulmonary embolism ([place])   . RA (rheumatoid arthritis) ([place])   . RLS (restless legs syndrome)   . Sleep apnea   . Vitamin B 12 deficiency   . Vitamin D deficiency     PAST SURGICAL HISTORY:  Past Surgical History:  Procedure Laterality Date  . EYE SURGERY    . MOUTH SURGERY      SOCIAL HISTORY: Social History   Tobacco Use  . Smoking status: Former Smoker    Packs/day: 1.50    Years: 8.00    Pack years: 12.00  . Smokeless tobacco: Never Used  Substance Use Topics  . Alcohol use: No    Alcohol/week: 0.0 standard drinks  . Drug use: No    FAMILY HISTORY: Family History  Problem Relation Age of Onset  . Pneumonia Mother   . Asthma Mother   . Diabetes Mother   . Hypertension Mother   . Depression Mother   . Anxiety disorder Mother   . Eating disorder Mother   . Obesity Mother   . Sudden death Mother   . Cancer Father        Brain tumor  . Asthma Father     ROS: Review of Systems  Constitutional: Negative for weight loss.  Gastrointestinal: Negative for diarrhea, nausea Sally [name] vomiting.  Musculoskeletal:       Negative for muscle weakness  Endo/Heme/Allergies:       Negative for hypoglycemia     PHYSICAL EXAM: Blood pressure 134/79, pulse 87, temperature 97.8 F (36.6 C), temperature source Oral, height 5' 4"  (1.626 m), weight 266 lb (120.7 kg), last menstrual period 05/31/2012, SpO2 97 %. Body mass index is 45.66 kg/m. Physical  Exam Vitals signs reviewed.  Constitutional:      Appearance: Normal appearance. She is obese.  Cardiovascular:     Rate Sally [name] Rhythm: Normal rate.     Pulses: Normal pulses.  Pulmonary:     Effort: Pulmonary effort is normal.  Musculoskeletal: Normal range of motion.  Skin:    General: Skin is warm Sally [name] dry.  Neurological:     Mental Status: She is alert Sally [name] oriented to person, place, Sally [name] time.  Psychiatric:        Mood Sally [name] Affect: Mood normal.        Behavior: Behavior normal.     RECENT LABS Sally [name] TESTS: BMET    Component Value Date/Time   NA 138 05/14/2018 0941   K 4.9 05/14/2018 0941   CL 99 05/14/2018 0941   CO2 24 05/14/2018 0941   GLUCOSE 123 (H) 05/14/2018 0941   GLUCOSE 130 (H) 11/06/2017 0222   BUN 15 05/14/2018 0941   CREATININE 0.73 05/14/2018 0941   CALCIUM 9.1 05/14/2018 0941   GFRNONAA 90 05/14/2018 0941   GFRAA 104 05/14/2018 0941   Lab Results  Component Value Date   HGBA1C 6.8 (H) 05/14/2018   Lab Results  Component Value Date   INSULIN 9.8 05/14/2018   CBC    Component Value Date/Time   WBC 8.0 05/14/2018 0941   WBC 10.1 11/06/2017 0222   RBC 3.97 05/14/2018 0941   RBC 4.31 11/06/2017 0222   HGB 12.0 05/14/2018 0941   HCT 37.4 05/14/2018 0941   PLT 380 11/06/2017 0222   MCV 94 05/14/2018 0941   MCH 30.2 05/14/2018 0941   MCH 29.0 11/06/2017 0222   MCHC 32.1 05/14/2018 0941   MCHC 32.3 11/06/2017 0222   RDW 14.1 05/14/2018 0941   LYMPHSABS 1.9 05/14/2018 0941   MONOABS 0.6 11/06/2017 0222   EOSABS 0.2 05/14/2018 0941   BASOSABS 0.1 05/14/2018 0941   Iron/TIBC/Ferritin/ %Sat No results found for: IRON, TIBC, FERRITIN, IRONPCTSAT Lipid Panel     Component Value Date/Time   CHOL 160 05/14/2018 0941  TRIG 163 (H) 05/14/2018 0941   HDL 50 05/14/2018 0941   LDLCALC 77 05/14/2018 0941   Hepatic Function Panel     Component Value Date/Time   PROT 7.0 05/14/2018 0941   ALBUMIN 4.4 05/14/2018 0941   AST 18 05/14/2018 0941   ALT 23  05/14/2018 0941   ALKPHOS 79 05/14/2018 0941   BILITOT 0.3 05/14/2018 0941      Component Value Date/Time   TSH 5.150 (H) 05/14/2018 0941    Ref. Range 05/14/2018 09:41  Vitamin D, 25-Hydroxy Latest Ref Range: 30.0 - 100.0 ng/mL 27.9 (L)     OBESITY BEHAVIORAL INTERVENTION VISIT  Today's visit was # 7   Starting weight: 281 lbs Starting date: 05/14/2018 Today's weight :266 lbs Today's date: 08/21/2018 Total lbs lost to date: 15   ASK: We discussed the diagnosis of obesity with Sally [name] today Sally [name] Sally [name] agreed to give [name] permission to discuss obesity behavioral modification therapy today.  ASSESS: Sally [name] has the diagnosis of obesity Sally [name] her BMI today is 45.64 Andriea is in the action stage of change   ADVISE: Jalasia was educated on the multiple health risks of obesity as well as the benefit of weight loss to improve her health. She was advised of the need for long term treatment Sally [name] the importance of lifestyle modifications to improve her current health Sally [name] to decrease her risk of future health problems.  AGREE: Multiple dietary modification options Sally [name] treatment options were discussed Sally [name]  Sally [name] agreed to follow the recommendations documented in the above note.  ARRANGE: [place] was educated on the importance of frequent visits to treat obesity as outlined per CMS Sally [name] USPSTF guidelines Sally [name] agreed to schedule her next follow up appointment today.  I, Tammy Wysor, am acting as Location manager for [employer] [facility] [name] I, Abby Potash, PA-C have reviewed above note Sally [name] agree with its content

## 2018-09-04 ENCOUNTER — Encounter (INDEPENDENT_AMBULATORY_CARE_PROVIDER_SITE_OTHER): Payer: Self-pay | Admitting: Physician Assistant

## 2018-09-04 ENCOUNTER — Other Ambulatory Visit (INDEPENDENT_AMBULATORY_CARE_PROVIDER_SITE_OTHER): Payer: Self-pay | Admitting: Physician Assistant

## 2018-09-04 ENCOUNTER — Ambulatory Visit (INDEPENDENT_AMBULATORY_CARE_PROVIDER_SITE_OTHER): Payer: Medicare Other | Admitting: Physician Assistant

## 2018-09-04 VITALS — BP 113/73 | HR 79 | Temp 97.6°F | Ht 64.0 in | Wt 268.0 lb

## 2018-09-04 DIAGNOSIS — E1149 Type 2 diabetes mellitus with other diabetic neurological complication: Secondary | ICD-10-CM

## 2018-09-04 DIAGNOSIS — Z9189 Other specified personal risk factors, not elsewhere classified: Secondary | ICD-10-CM | POA: Diagnosis not present

## 2018-09-04 DIAGNOSIS — Z6841 Body Mass Index (BMI) 40.0 and over, adult: Secondary | ICD-10-CM | POA: Diagnosis not present

## 2018-09-04 DIAGNOSIS — E559 Vitamin D deficiency, unspecified: Secondary | ICD-10-CM | POA: Diagnosis not present

## 2018-09-04 MED ORDER — VITAMIN D (ERGOCALCIFEROL) 1.25 MG (50000 UNIT) PO CAPS: 50000.0000 [IU] | ORAL_CAPSULE | ORAL | 0 refills | Status: DC

## 2018-09-04 MED ORDER — METFORMIN HCL ER 500 MG PO TB24: 500.0000 mg | ORAL_TABLET | Freq: Two times a day (BID) | ORAL | 0 refills | Status: DC

## 2018-09-04 NOTE — Progress Notes (Signed)
Office: [phone]  /  Fax: [phone]   HPI:   Chief Complaint: OBESITY Sally [name] is here to discuss her progress with her obesity treatment plan. She is on the Category 3 plan and is following her eating plan approximately 85% of the time. She states she is doing Tai Chi 60 minutes 1 time per week. Sally [name] reports that she has been skipping breakfast. She also states that she is eating [employer] dinners at night. Her weight is 268 lb (121.6 kg) today and has had a 2 pound weight gain since her last visit. She has lost 13 lbs since starting treatment with [name].  Diabetes II with peripheral neuropathy  Sally [name] has a diagnosis of diabetes type II. Sally [name] states fasting blood sugars range between 110's to 150. Sally [name] is on metformin. She denies any hypoglycemic episodes. No nausea, vomiting, diarrhea, or polydipsia. Last A1c was 6.8 on 05/14/2018. She has been working on intensive lifestyle modifications including diet, exercise, and weight loss to help control her blood glucose levels.  Vitamin D deficiency Al[place] has a diagnosis of vitamin D deficiency. She is currently taking Vitamin D and denies nausea, vomiting or muscle weakness.  At risk for osteopenia and osteoporosis Ady is at higher risk of osteopenia and osteoporosis due to Vitamin D deficiency.   ASSESSMENT AND PLAN:  Vitamin D deficiency - Plan: Vitamin D, Ergocalciferol, (DRISDOL) 1.25 MG (50000 UT) CAPS capsule  Type 2 diabetes mellitus with other neurologic complication, without long-term current use of insulin (HCC) - Plan: metFORMIN (GLUCOPHAGE-XR) 500 MG 24 hr tablet  At risk for osteoporosis  Class 3 severe obesity with serious comorbidity and body mass index (BMI) of 45.0 to 49.9 in adult, unspecified obesity type ([place])  PLAN:  Diabetes II with peripheral neuropathy Sally [name] has been given extensive diabetes education by myself today including ideal fasting and post-prandial blood glucose readings,  individual ideal HgA1c goals  and hypoglycemia prevention. We discussed the importance of good blood sugar control to decrease the likelihood of diabetic complications such as nephropathy, neuropathy, limb loss, blindness, coronary artery disease, and death. We discussed the importance of intensive lifestyle modification including diet, exercise and weight loss as the first line treatment for diabetes. Chey agrees to continue her diabetes medications and will follow up at the agreed upon time. Aimie was given a prescription refill for metformin #60 with no refills. [name] agrees to follow-up with our clinic in 2 weeks.  Vitamin D Deficiency Sally [name] was informed that low Vitamin D levels contributes to fatigue and are associated with obesity, breast, and colon cancer. She agrees to continue to take prescription Vit D @ 50,000 IU every three days #10 with no refills and will follow up for routine testing of Vitamin D, at least 2-3 times per year. She was informed of the risk of over-replacement of Vitamin D and agrees to not increase her dose unless she discusses this with [name] first. Sally [name] agrees to follow-up with our clinic in 2 weeks.  At risk for osteopenia and osteoporosis Sally [name] was given extended  (15 minutes) osteoporosis prevention counseling today. Sally [name] is at risk for osteopenia and osteoporsis due to her vitamin D deficiency. She was encouraged to take her vitamin D and follow her higher calcium diet and increase strengthening exercise to help strengthen her bones and decrease her risk of osteopenia and osteoporosis.  Obesity [name] is currently in the action stage of change. As such, her goal is to continue with weight loss efforts. She has agreed  to follow the Category 3 plan. Sally [name] has been instructed to work up to a goal of 150 minutes of combined cardio and strengthening exercise per week for weight loss and overall health benefits. We discussed the following Behavioral Modification  Strategies today: increasing lean protein intake and keeping healthy foods in the home.  Sally [name] has agreed to follow up with our clinic in 2 weeks. She was informed of the importance of frequent follow up visits to maximize her success with intensive lifestyle modifications for her multiple health conditions.  ALLERGIES: No Known Allergies  MEDICATIONS: Current Outpatient Medications on File Prior to Visit  Medication Sig Dispense Refill  . albuterol (PROVENTIL HFA;VENTOLIN HFA) 108 (90 BASE) MCG/ACT inhaler Inhale 2 puffs into the lungs every 6 (six) hours as needed. FOR WHEEZING     . beclomethasone (QVAR) 80 MCG/ACT inhaler Inhale 2 puffs into the lungs 2 (two) times daily.    . blood glucose meter kit and supplies Dispense based on patient and insurance preference. Use up to four times daily as directed. (FOR ICD-10 E10.9, E11.9). Test twice daily 1 each 0  . cyclobenzaprine (FLEXERIL) 10 MG tablet Take 10 mg by mouth 3 (three) times daily as needed for muscle spasms.    . DULoxetine (CYMBALTA) 30 MG capsule Take 30 mg by mouth 3 (three) times daily.    . folic acid (FOLVITE) 1 MG tablet Take 1 mg by mouth at bedtime.   0  . furosemide (LASIX) 20 MG tablet Take 20 mg by mouth daily as needed.    . gabapentin (NEURONTIN) 300 MG capsule Take 300 mg by mouth at bedtime.  0  . meloxicam (MOBIC) 15 MG tablet Take 15 mg by mouth at bedtime.  3  . methotrexate (RHEUMATREX) 2.5 MG tablet Take 2.5 mg by mouth once a week. Take 8 tablets by mouth once a week on Friday.  3  . montelukast (SINGULAIR) 10 MG tablet Take 10 mg by mouth at bedtime.    . Multiple Vitamins-Minerals (HAIR SKIN & NAILS ADVANCED PO) Take 2 tablets by mouth 2 (two) times daily.    . mupirocin ointment (BACTROBAN) 2 % Place 1 application into the nose 2 (two) times daily as needed.    . predniSONE (DELTASONE) 10 MG tablet Take 10 mg by mouth daily with breakfast.    . traMADol (ULTRAM) 50 MG tablet Take 50 mg by mouth 2 (two)  times daily as needed for moderate pain.      No current facility-administered medications on file prior to visit.     PAST MEDICAL HISTORY: Past Medical History:  Diagnosis Date  . ADD (attention deficit disorder)   . Anemia   . Anxiety   . Arthritis   . Asthma   . Back pain   . CFS (chronic fatigue syndrome)   . Colitis   . Depression   . Diabetes ([place])   . Dyspnea   . Fibromyalgia   . GERD (gastroesophageal reflux disease)   . Hx of blood clots   . IBS (irritable bowel syndrome)   . Joint pain   . Left knee pain   . Leg edema   . Osteoarthritis   . Prediabetes   . Pulmonary embolism ([place])   . RA (rheumatoid arthritis) ([place])   . RLS (restless legs syndrome)   . Sleep apnea   . Vitamin B 12 deficiency   . Vitamin D deficiency     PAST SURGICAL HISTORY: Past Surgical History:  Procedure  Laterality Date  . EYE SURGERY    . MOUTH SURGERY      SOCIAL HISTORY: Social History   Tobacco Use  . Smoking status: Former Smoker    Packs/day: 1.50    Years: 8.00    Pack years: 12.00  . Smokeless tobacco: Never Used  Substance Use Topics  . Alcohol use: No    Alcohol/week: 0.0 standard drinks  . Drug use: No    FAMILY HISTORY: Family History  Problem Relation Age of Onset  . Pneumonia Mother   . Asthma Mother   . Diabetes Mother   . Hypertension Mother   . Depression Mother   . Anxiety disorder Mother   . Eating disorder Mother   . Obesity Mother   . Sudden death Mother   . Cancer Father        Brain tumor  . Asthma Father    ROS: Review of Systems  Constitutional: Negative for weight loss.  Gastrointestinal: Negative for diarrhea, nausea and vomiting.  Musculoskeletal:       Negative muscle weakness  Endo/Heme/Allergies: Negative for polydipsia.       Negative hypoglycemia   PHYSICAL EXAM: Blood pressure 113/73, pulse 79, temperature 97.6 F (36.4 C), temperature source Oral, height 5' 4"  (1.626 m), weight 268 lb (121.6 kg), last menstrual  period 05/31/2012, SpO2 97 %. Body mass index is 46 kg/m. Physical Exam Vitals signs reviewed.  Constitutional:      Appearance: Normal appearance. She is obese.  Cardiovascular:     Rate and Rhythm: Normal rate.     Pulses: Normal pulses.  Pulmonary:     Effort: Pulmonary effort is normal.     Breath sounds: Normal breath sounds.  Musculoskeletal: Normal range of motion.  Skin:    General: Skin is warm and dry.  Neurological:     Mental Status: She is alert and oriented to person, place, and time.  Psychiatric:        Mood and Affect: Mood normal.        Behavior: Behavior normal.   RECENT LABS AND TESTS: BMET    Component Value Date/Time   NA 138 05/14/2018 0941   K 4.9 05/14/2018 0941   CL 99 05/14/2018 0941   CO2 24 05/14/2018 0941   GLUCOSE 123 (H) 05/14/2018 0941   GLUCOSE 130 (H) 11/06/2017 0222   BUN 15 05/14/2018 0941   CREATININE 0.73 05/14/2018 0941   CALCIUM 9.1 05/14/2018 0941   GFRNONAA 90 05/14/2018 0941   GFRAA 104 05/14/2018 0941   Lab Results  Component Value Date   HGBA1C 6.8 (H) 05/14/2018   Lab Results  Component Value Date   INSULIN 9.8 05/14/2018   CBC    Component Value Date/Time   WBC 8.0 05/14/2018 0941   WBC 10.1 11/06/2017 0222   RBC 3.97 05/14/2018 0941   RBC 4.31 11/06/2017 0222   HGB 12.0 05/14/2018 0941   HCT 37.4 05/14/2018 0941   PLT 380 11/06/2017 0222   MCV 94 05/14/2018 0941   MCH 30.2 05/14/2018 0941   MCH 29.0 11/06/2017 0222   MCHC 32.1 05/14/2018 0941   MCHC 32.3 11/06/2017 0222   RDW 14.1 05/14/2018 0941   LYMPHSABS 1.9 05/14/2018 0941   MONOABS 0.6 11/06/2017 0222   EOSABS 0.2 05/14/2018 0941   BASOSABS 0.1 05/14/2018 0941   Iron/TIBC/Ferritin/ %Sat No results found for: IRON, TIBC, FERRITIN, IRONPCTSAT Lipid Panel     Component Value Date/Time   CHOL 160 05/14/2018 0941  TRIG 163 (H) 05/14/2018 0941   HDL 50 05/14/2018 0941   LDLCALC 77 05/14/2018 0941   Hepatic Function Panel     Component  Value Date/Time   PROT 7.0 05/14/2018 0941   ALBUMIN 4.4 05/14/2018 0941   AST 18 05/14/2018 0941   ALT 23 05/14/2018 0941   ALKPHOS 79 05/14/2018 0941   BILITOT 0.3 05/14/2018 0941      Component Value Date/Time   TSH 5.150 (H) 05/14/2018 0941   Results for ALIZAYA, OSHEA (MRN [id number]) as of 09/04/2018 15:27  Ref. Range 05/14/2018 09:41  Vitamin D, 25-Hydroxy Latest Ref Range: 30.0 - 100.0 ng/mL 27.9 (L)   OBESITY BEHAVIORAL INTERVENTION VISIT  Today's visit was #8   Starting weight: 281 lbs Starting date: 05/14/2018 Today's weight: 268 lbs Today's date: 09/04/2018 Total lbs lost to date: 13  ASK: We discussed the diagnosis of obesity with Alyson Ingles today and Berdell agreed to give [name] permission to discuss obesity behavioral modification therapy today.  ASSESS: Torrin has the diagnosis of obesity and her BMI today is 46.00. Veona is in the action stage of change.  ADVISE: Hasset was educated on the multiple health risks of obesity as well as the benefit of weight loss to improve her health. She was advised of the need for long term treatment and the importance of lifestyle modifications to improve her current health and to decrease her risk of future health problems.  AGREE: Multiple dietary modification options and treatment options were discussed and  Dacey agreed to follow the recommendations documented in the above note.  ARRANGE: Ioma was educated on the importance of frequent visits to treat obesity as outlined per CMS and USPSTF guidelines and agreed to schedule her next follow up appointment today.  Migdalia Dk, am acting as transcriptionist for Abby Potash, PA-C I, Abby Potash, PA-C have reviewed above note and agree with its content

## 2018-09-19 ENCOUNTER — Ambulatory Visit (INDEPENDENT_AMBULATORY_CARE_PROVIDER_SITE_OTHER): Payer: Medicare Other | Admitting: Physician Assistant

## 2018-09-19 ENCOUNTER — Encounter (INDEPENDENT_AMBULATORY_CARE_PROVIDER_SITE_OTHER): Payer: Self-pay | Admitting: Physician Assistant

## 2018-09-19 VITALS — BP 122/78 | HR 85 | Temp 97.6°F | Ht 64.0 in | Wt 264.0 lb

## 2018-09-19 DIAGNOSIS — E1149 Type 2 diabetes mellitus with other diabetic neurological complication: Secondary | ICD-10-CM

## 2018-09-19 DIAGNOSIS — E559 Vitamin D deficiency, unspecified: Secondary | ICD-10-CM

## 2018-09-19 DIAGNOSIS — Z6841 Body Mass Index (BMI) 40.0 and over, adult: Secondary | ICD-10-CM

## 2018-09-19 MED ORDER — METFORMIN HCL ER 500 MG PO TB24: 500.0000 mg | ORAL_TABLET | Freq: Two times a day (BID) | ORAL | 0 refills | Status: DC

## 2018-09-22 NOTE — Progress Notes (Signed)
Office: [phone]  /  Fax: [phone]   HPI:   Chief Complaint: OBESITY Sally [name] is here to discuss her progress with her obesity treatment plan. She is on the Category 3 plan and is following her eating plan approximately 85 % of the time. She states she is exercising 0 minutes 0 times per week. Sally [name] did very well with weight loss. She is undereating her protein at dinner with microwave meals. She is traveling to [place] this weekend to meet her new granddaughter.  Her weight is 264 lb (119.7 kg) today and has had a weight loss of 4 pounds over a period of 2 weeks since her last visit. She has lost 17 lbs since starting treatment with [name].  Diabetes II Sally [name] has a diagnosis of diabetes type II. Sally [name] states fasting BGs range between 111 and 135, and denies hypoglycemia. She is on metformin and denies nausea, vomiting, or diarrhea. Last A1c was 6.8. She has been working on intensive lifestyle modifications including diet, exercise, and weight loss to help control her blood glucose levels.  Vitamin D Deficiency Sally [name] has a diagnosis of vitamin D deficiency. She is currently taking prescription Vit D and denies nausea, vomiting or muscle weakness.  ALLERGIES: No Known Allergies  MEDICATIONS: Current Outpatient Medications on File Prior to Visit  Medication Sig Dispense Refill  . albuterol (PROVENTIL HFA;VENTOLIN HFA) 108 (90 BASE) MCG/ACT inhaler Inhale 2 puffs into the lungs every 6 (six) hours as needed. FOR WHEEZING     . beclomethasone (QVAR) 80 MCG/ACT inhaler Inhale 2 puffs into the lungs 2 (two) times daily.    . blood glucose meter kit and supplies Dispense based on patient and insurance preference. Use up to four times daily as directed. (FOR ICD-10 E10.9, E11.9). Test twice daily 1 each 0  . cyclobenzaprine (FLEXERIL) 10 MG tablet Take 10 mg by mouth 3 (three) times daily as needed for muscle spasms.    . DULoxetine (CYMBALTA) 30 MG capsule Take 30 mg by mouth 3 (three)  times daily.    . folic acid (FOLVITE) 1 MG tablet Take 1 mg by mouth at bedtime.   0  . furosemide (LASIX) 20 MG tablet Take 20 mg by mouth daily as needed.    . gabapentin (NEURONTIN) 300 MG capsule Take 300 mg by mouth at bedtime.  0  . meloxicam (MOBIC) 15 MG tablet Take 15 mg by mouth at bedtime.  3  . methotrexate (RHEUMATREX) 2.5 MG tablet Take 2.5 mg by mouth once a week. Take 8 tablets by mouth once a week on Friday.  3  . montelukast (SINGULAIR) 10 MG tablet Take 10 mg by mouth at bedtime.    . Multiple Vitamins-Minerals (HAIR SKIN & NAILS ADVANCED PO) Take 2 tablets by mouth 2 (two) times daily.    . mupirocin ointment (BACTROBAN) 2 % Place 1 application into the nose 2 (two) times daily as needed.    . predniSONE (DELTASONE) 10 MG tablet Take 10 mg by mouth daily with breakfast.    . traMADol (ULTRAM) 50 MG tablet Take 50 mg by mouth 2 (two) times daily as needed for moderate pain.     . Vitamin D, Ergocalciferol, (DRISDOL) 1.25 MG (50000 UT) CAPS capsule Take 1 capsule (50,000 Units total) by mouth every 3 (three) days. On Friday 10 capsule 0   No current facility-administered medications on file prior to visit.     PAST MEDICAL HISTORY: Past Medical History:  Diagnosis Date  . ADD (  attention deficit disorder)   . Anemia   . Anxiety   . Arthritis   . Asthma   . Back pain   . CFS (chronic fatigue syndrome)   . Colitis   . Depression   . Diabetes ([place])   . Dyspnea   . Fibromyalgia   . GERD (gastroesophageal reflux disease)   . Hx of blood clots   . IBS (irritable bowel syndrome)   . Joint pain   . Left knee pain   . Leg edema   . Osteoarthritis   . Prediabetes   . Pulmonary embolism ([place])   . RA (rheumatoid arthritis) ([place])   . RLS (restless legs syndrome)   . Sleep apnea   . Vitamin B 12 deficiency   . Vitamin D deficiency     PAST SURGICAL HISTORY: Past Surgical History:  Procedure Laterality Date  . EYE SURGERY    . MOUTH SURGERY      SOCIAL  HISTORY: Social History   Tobacco Use  . Smoking status: Former Smoker    Packs/day: 1.50    Years: 8.00    Pack years: 12.00  . Smokeless tobacco: Never Used  Substance Use Topics  . Alcohol use: No    Alcohol/week: 0.0 standard drinks  . Drug use: No    FAMILY HISTORY: Family History  Problem Relation Age of Onset  . Pneumonia Mother   . Asthma Mother   . Diabetes Mother   . Hypertension Mother   . Depression Mother   . Anxiety disorder Mother   . Eating disorder Mother   . Obesity Mother   . Sudden death Mother   . Cancer Father        Brain tumor  . Asthma Father     ROS: Review of Systems  Constitutional: Positive for weight loss.  Gastrointestinal: Negative for diarrhea, nausea and vomiting.  Musculoskeletal:       Negative muscle weakness  Endo/Heme/Allergies:       Negative hypoglycemia    PHYSICAL EXAM: Blood pressure 122/78, pulse 85, temperature 97.6 F (36.4 C), temperature source Oral, height '5\' 4"'$  (1.626 m), weight 264 lb (119.7 kg), last menstrual period 05/31/2012, SpO2 99 %. Body mass index is 45.32 kg/m. Physical Exam Vitals signs reviewed.  Constitutional:      Appearance: Normal appearance. She is obese.  Cardiovascular:     Rate and Rhythm: Normal rate.     Pulses: Normal pulses.  Pulmonary:     Effort: Pulmonary effort is normal.     Breath sounds: Normal breath sounds.  Musculoskeletal: Normal range of motion.  Skin:    General: Skin is warm and dry.  Neurological:     Mental Status: She is alert and oriented to person, place, and time.  Psychiatric:        Mood and Affect: Mood normal.        Behavior: Behavior normal.     RECENT LABS AND TESTS: BMET    Component Value Date/Time   NA 138 05/14/2018 0941   K 4.9 05/14/2018 0941   CL 99 05/14/2018 0941   CO2 24 05/14/2018 0941   GLUCOSE 123 (H) 05/14/2018 0941   GLUCOSE 130 (H) 11/06/2017 0222   BUN 15 05/14/2018 0941   CREATININE 0.73 05/14/2018 0941   CALCIUM 9.1  05/14/2018 0941   GFRNONAA 90 05/14/2018 0941   GFRAA 104 05/14/2018 0941   Lab Results  Component Value Date   HGBA1C 6.8 (H) 05/14/2018   Lab  Results  Component Value Date   INSULIN 9.8 05/14/2018   CBC    Component Value Date/Time   WBC 8.0 05/14/2018 0941   WBC 10.1 11/06/2017 0222   RBC 3.97 05/14/2018 0941   RBC 4.31 11/06/2017 0222   HGB 12.0 05/14/2018 0941   HCT 37.4 05/14/2018 0941   PLT 380 11/06/2017 0222   MCV 94 05/14/2018 0941   MCH 30.2 05/14/2018 0941   MCH 29.0 11/06/2017 0222   MCHC 32.1 05/14/2018 0941   MCHC 32.3 11/06/2017 0222   RDW 14.1 05/14/2018 0941   LYMPHSABS 1.9 05/14/2018 0941   MONOABS 0.6 11/06/2017 0222   EOSABS 0.2 05/14/2018 0941   BASOSABS 0.1 05/14/2018 0941   Iron/TIBC/Ferritin/ %Sat No results found for: IRON, TIBC, FERRITIN, IRONPCTSAT Lipid Panel     Component Value Date/Time   CHOL 160 05/14/2018 0941   TRIG 163 (H) 05/14/2018 0941   HDL 50 05/14/2018 0941   LDLCALC 77 05/14/2018 0941   Hepatic Function Panel     Component Value Date/Time   PROT 7.0 05/14/2018 0941   ALBUMIN 4.4 05/14/2018 0941   AST 18 05/14/2018 0941   ALT 23 05/14/2018 0941   ALKPHOS 79 05/14/2018 0941   BILITOT 0.3 05/14/2018 0941      Component Value Date/Time   TSH 5.150 (H) 05/14/2018 0941    ASSESSMENT AND PLAN: Type 2 diabetes mellitus with other neurologic complication, without long-term current use of insulin ([place]) - Plan: metFORMIN (GLUCOPHAGE-XR) 500 MG 24 hr tablet  Vitamin D deficiency  Class 3 severe obesity with serious comorbidity and body mass index (BMI) of 45.0 to 49.9 in adult, unspecified obesity type ([place])  PLAN:  Diabetes II Sally [name] has been given extensive diabetes education by myself today including ideal fasting and post-prandial blood glucose readings, individual ideal Hgb A1c goals and hypoglycemia prevention. We discussed the importance of good blood sugar control to decrease the likelihood of diabetic  complications such as nephropathy, neuropathy, limb loss, blindness, coronary artery disease, and death. We discussed the importance of intensive lifestyle modification including diet, exercise and weight loss as the first line treatment for diabetes. Anabela agrees to continue taking metformin 500 mg BID #60 and we will refill for 1 month. Keylie agrees to follow up with our clinic in 2 weeks.  Vitamin D Deficiency Sally [name] was informed that low vitamin D levels contributes to fatigue and are associated with obesity, breast, and colon cancer. Sally [name] agrees to continue taking prescription Vit D '@50'$ ,000 IU every 3 days and will follow up for routine testing of vitamin D, at least 2-3 times per year. She was informed of the risk of over-replacement of vitamin D and agrees to not increase her dose unless she discusses this with [name] first.  Obesity Sally [name] is currently in the action stage of change. As such, her goal is to continue with weight loss efforts She has agreed to follow the Category 3 plan Sally [name] has been instructed to work up to a goal of 150 minutes of combined cardio and strengthening exercise per week for weight loss and overall health benefits. We discussed the following Behavioral Modification Strategies today: increasing lean protein intake and work on meal planning and easy cooking plans Addition lunch options were given.  Sally [name] has agreed to follow up with our clinic in 2 weeks. She was informed of the importance of frequent follow up visits to maximize her success with intensive lifestyle modifications for her multiple health conditions.   OBESITY BEHAVIORAL  INTERVENTION VISIT  Today's visit was # 9  Starting weight: 281 lbs Starting date: 05/14/18 Today's weight : 264 lbs  Today's date: 09/19/2018 Total lbs lost to date: 17 At least 15 minutes were spent on discussing the following behavioral intervention visit.   ASK: We discussed the diagnosis of obesity with Sally [name] today and Sally [name] agreed to give [name] permission to discuss obesity behavioral modification therapy today.  ASSESS: Sally [name] has the diagnosis of obesity and her BMI today is 45.29 Sally [name] is in the action stage of change   ADVISE: Sally [name] was educated on the multiple health risks of obesity as well as the benefit of weight loss to improve her health. She was advised of the need for long term treatment and the importance of lifestyle modifications.  AGREE: Multiple dietary modification options and treatment options were discussed and  [place] agreed to the above obesity treatment plan.  Sally [name], am acting as transcriptionist for Abby Potash, PA-C I, Abby Potash, PA-C have reviewed above note and agree with its content

## 2018-10-08 ENCOUNTER — Encounter (INDEPENDENT_AMBULATORY_CARE_PROVIDER_SITE_OTHER): Payer: Self-pay | Admitting: Physician Assistant

## 2018-10-08 ENCOUNTER — Ambulatory Visit (INDEPENDENT_AMBULATORY_CARE_PROVIDER_SITE_OTHER): Payer: Medicare Other | Admitting: Physician Assistant

## 2018-10-08 VITALS — BP 128/78 | HR 88 | Temp 97.9°F | Ht 64.0 in | Wt 263.0 lb

## 2018-10-08 DIAGNOSIS — E7849 Other hyperlipidemia: Secondary | ICD-10-CM | POA: Diagnosis not present

## 2018-10-08 DIAGNOSIS — E1149 Type 2 diabetes mellitus with other diabetic neurological complication: Secondary | ICD-10-CM

## 2018-10-08 DIAGNOSIS — E559 Vitamin D deficiency, unspecified: Secondary | ICD-10-CM | POA: Diagnosis not present

## 2018-10-08 DIAGNOSIS — Z6841 Body Mass Index (BMI) 40.0 and over, adult: Secondary | ICD-10-CM

## 2018-10-08 MED ORDER — VITAMIN D (ERGOCALCIFEROL) 1.25 MG (50000 UNIT) PO CAPS: 50000.0000 [IU] | ORAL_CAPSULE | ORAL | 0 refills | Status: DC

## 2018-10-08 MED ORDER — METFORMIN HCL ER 500 MG PO TB24: 500.0000 mg | ORAL_TABLET | Freq: Two times a day (BID) | ORAL | 0 refills | Status: DC

## 2018-10-08 NOTE — Progress Notes (Signed)
Office: [phone]  /  Fax: [phone]   HPI:   Chief Complaint: OBESITY Sally [name] is here to discuss her progress with her obesity treatment plan. She is on the Category 3 plan and is following her eating plan approximately 90% of the time. She states she is exercising 0 minutes 0 times per week. Sally [name] did well with weight loss. She states that she continues to have a difficult time getting all of her protein in. Her weight is 263 lb (119.3 kg) today and has had a weight loss of 1 pound over a period of 3 weeks since her last visit. She has lost 18 lbs since starting treatment with [name].  Vitamin D deficiency Sally [name] has a diagnosis of Vitamin D deficiency. She is currently taking prescription Vit D and denies nausea, vomiting or muscle weakness.  Hyperlipidemia Sally [name] has hyperlipidemia and has been trying to improve her cholesterol levels with intensive lifestyle modification including a low saturated fat diet, exercise and weight loss. She denies any chest pain.  Diabetes II Sally [name] has a diagnosis of diabetes type II and is on metformin. Sally [name] does not report checking her blood sugars. Last A1c was 6.8 on 05/14/2018. She has been working on intensive lifestyle modifications including diet, exercise, and weight loss to help control her blood glucose levels. She denies nausea, vomiting, or diarrhea but does report some polyphagia.  ASSESSMENT AND PLAN:  Vitamin D deficiency - Plan: Vitamin D, Ergocalciferol, (DRISDOL) 1.25 MG (50000 UT) CAPS capsule, VITAMIN D 25 Hydroxy (Vit-D Deficiency, Fractures)  Other hyperlipidemia - Plan: Lipid Panel With LDL/HDL Ratio  Type 2 diabetes mellitus with other neurologic complication, without long-term current use of insulin (HCC) - Plan: metFORMIN (GLUCOPHAGE-XR) 500 MG 24 hr tablet, Comprehensive metabolic panel, Hemoglobin A1c, Insulin, random  Class 3 severe obesity with serious comorbidity and body mass index (BMI) of 45.0 to 49.9 in adult,  unspecified obesity type ([place])  PLAN:  Vitamin D Deficiency Sally [name] was informed that low Vitamin D levels contributes to fatigue and are associated with obesity, breast, and colon cancer. She agrees to continue to take prescription Vit D @ 50,000 IU every three days #10 with 0 refills and will follow-up for routine testing of Vitamin D, at least 2-3 times per year. She was informed of the risk of over-replacement of Vitamin D and agrees to not increase her dose unless she discusses this with [name] first. Sally [name] agrees to follow-up with our clinic in 2 weeks.  Hyperlipidemia Sally [name] was informed of the American Heart Association Guidelines emphasizing intensive lifestyle modifications as the first line treatment for hyperlipidemia. We discussed many lifestyle modifications today in depth, and [name] will continue to work on decreasing saturated fats such as fatty red meat, butter and many fried foods. She will also increase vegetables and lean protein in her diet and continue to work on exercise and weight loss efforts.  Diabetes II Sally [name] has been given extensive diabetes education by myself today including ideal fasting and post-prandial blood glucose readings, individual ideal HgA1c goals  and hypoglycemia prevention. We discussed the importance of good blood sugar control to decrease the likelihood of diabetic complications such as nephropathy, neuropathy, limb loss, blindness, coronary artery disease, and death. We discussed the importance of intensive lifestyle modification including diet, exercise and weight loss as the first line treatment for diabetes. [name] was given a refill on her metformin #60 with 0 refills. Will have labs checked today.  Obesity Sally [name] is currently in the action stage of  change. As such, her goal is to continue with weight loss efforts. She has agreed to follow the Category 3 plan. Sally [name] has been instructed to work up to a goal of 150 minutes of combined cardio and  strengthening exercise per week for weight loss and overall health benefits. We discussed the following Behavioral Modification Strategies today: increasing lean protein intake and work on meal planning and easy cooking plans.  Sally [name] has agreed to follow-up with our clinic in 2 weeks. She was informed of the importance of frequent follow up visits to maximize her success with intensive lifestyle modifications for her multiple health conditions.  ALLERGIES: No Known Allergies  MEDICATIONS: Current Outpatient Medications on File Prior to Visit  Medication Sig Dispense Refill  . albuterol (PROVENTIL HFA;VENTOLIN HFA) 108 (90 BASE) MCG/ACT inhaler Inhale 2 puffs into the lungs every 6 (six) hours as needed. FOR WHEEZING     . beclomethasone (QVAR) 80 MCG/ACT inhaler Inhale 2 puffs into the lungs 2 (two) times daily.    . blood glucose meter kit and supplies Dispense based on patient and insurance preference. Use up to four times daily as directed. (FOR ICD-10 E10.9, E11.9). Test twice daily 1 each 0  . cyclobenzaprine (FLEXERIL) 10 MG tablet Take 10 mg by mouth 3 (three) times daily as needed for muscle spasms.    . DULoxetine (CYMBALTA) 30 MG capsule Take 30 mg by mouth 3 (three) times daily.    . folic acid (FOLVITE) 1 MG tablet Take 1 mg by mouth at bedtime.   0  . furosemide (LASIX) 20 MG tablet Take 20 mg by mouth daily as needed.    . gabapentin (NEURONTIN) 300 MG capsule Take 300 mg by mouth at bedtime.  0  . meloxicam (MOBIC) 15 MG tablet Take 15 mg by mouth at bedtime.  3  . methotrexate (RHEUMATREX) 2.5 MG tablet Take 2.5 mg by mouth once a week. Take 8 tablets by mouth once a week on Friday.  3  . montelukast (SINGULAIR) 10 MG tablet Take 10 mg by mouth at bedtime.    . Multiple Vitamins-Minerals (HAIR SKIN & NAILS ADVANCED PO) Take 2 tablets by mouth 2 (two) times daily.    . mupirocin ointment (BACTROBAN) 2 % Place 1 application into the nose 2 (two) times daily as needed.    .  predniSONE (DELTASONE) 10 MG tablet Take 10 mg by mouth daily with breakfast.    . traMADol (ULTRAM) 50 MG tablet Take 50 mg by mouth 2 (two) times daily as needed for moderate pain.      No current facility-administered medications on file prior to visit.     PAST MEDICAL HISTORY: Past Medical History:  Diagnosis Date  . ADD (attention deficit disorder)   . Anemia   . Anxiety   . Arthritis   . Asthma   . Back pain   . CFS (chronic fatigue syndrome)   . Colitis   . Depression   . Diabetes ([place])   . Dyspnea   . Fibromyalgia   . GERD (gastroesophageal reflux disease)   . Hx of blood clots   . IBS (irritable bowel syndrome)   . Joint pain   . Left knee pain   . Leg edema   . Osteoarthritis   . Prediabetes   . Pulmonary embolism ([place])   . RA (rheumatoid arthritis) ([place])   . RLS (restless legs syndrome)   . Sleep apnea   . Vitamin B 12 deficiency   .  Vitamin D deficiency     PAST SURGICAL HISTORY: Past Surgical History:  Procedure Laterality Date  . EYE SURGERY    . MOUTH SURGERY      SOCIAL HISTORY: Social History   Tobacco Use  . Smoking status: Former Smoker    Packs/day: 1.50    Years: 8.00    Pack years: 12.00  . Smokeless tobacco: Never Used  Substance Use Topics  . Alcohol use: No    Alcohol/week: 0.0 standard drinks  . Drug use: No    FAMILY HISTORY: Family History  Problem Relation Age of Onset  . Pneumonia Mother   . Asthma Mother   . Diabetes Mother   . Hypertension Mother   . Depression Mother   . Anxiety disorder Mother   . Eating disorder Mother   . Obesity Mother   . Sudden death Mother   . Cancer Father        Brain tumor  . Asthma Father    ROS: Review of Systems  Constitutional: Positive for weight loss.  Cardiovascular: Negative for chest pain.  Gastrointestinal: Negative for diarrhea, nausea and vomiting.  Musculoskeletal:       Negative for muscle weakness.  Endo/Heme/Allergies:       Positive for some  polyphagia. Negative for hypoglycemia.   PHYSICAL EXAM: Blood pressure 128/78, pulse 88, temperature 97.9 F (36.6 C), height 5' 4" (1.626 m), weight 263 lb (119.3 kg), last menstrual period 05/31/2012, SpO2 97 %. Body mass index is 45.14 kg/m. Physical Exam Vitals signs reviewed.  Constitutional:      Appearance: Normal appearance. She is obese.  Cardiovascular:     Rate and Rhythm: Normal rate.     Pulses: Normal pulses.  Pulmonary:     Effort: Pulmonary effort is normal.     Breath sounds: Normal breath sounds.  Musculoskeletal: Normal range of motion.  Skin:    General: Skin is warm and dry.  Neurological:     Mental Status: She is alert and oriented to person, place, and time.  Psychiatric:        Behavior: Behavior normal.   RECENT LABS AND TESTS: BMET    Component Value Date/Time   NA 138 05/14/2018 0941   K 4.9 05/14/2018 0941   CL 99 05/14/2018 0941   CO2 24 05/14/2018 0941   GLUCOSE 123 (H) 05/14/2018 0941   GLUCOSE 130 (H) 11/06/2017 0222   BUN 15 05/14/2018 0941   CREATININE 0.73 05/14/2018 0941   CALCIUM 9.1 05/14/2018 0941   GFRNONAA 90 05/14/2018 0941   GFRAA 104 05/14/2018 0941   Lab Results  Component Value Date   HGBA1C 6.8 (H) 05/14/2018   Lab Results  Component Value Date   INSULIN 9.8 05/14/2018   CBC    Component Value Date/Time   WBC 8.0 05/14/2018 0941   WBC 10.1 11/06/2017 0222   RBC 3.97 05/14/2018 0941   RBC 4.31 11/06/2017 0222   HGB 12.0 05/14/2018 0941   HCT 37.4 05/14/2018 0941   PLT 380 11/06/2017 0222   MCV 94 05/14/2018 0941   MCH 30.2 05/14/2018 0941   MCH 29.0 11/06/2017 0222   MCHC 32.1 05/14/2018 0941   MCHC 32.3 11/06/2017 0222   RDW 14.1 05/14/2018 0941   LYMPHSABS 1.9 05/14/2018 0941   MONOABS 0.6 11/06/2017 0222   EOSABS 0.2 05/14/2018 0941   BASOSABS 0.1 05/14/2018 0941   Iron/TIBC/Ferritin/ %Sat No results found for: IRON, TIBC, FERRITIN, IRONPCTSAT Lipid Panel     Component  Value Date/Time   CHOL  160 05/14/2018 0941   TRIG 163 (H) 05/14/2018 0941   HDL 50 05/14/2018 0941   LDLCALC 77 05/14/2018 0941   Hepatic Function Panel     Component Value Date/Time   PROT 7.0 05/14/2018 0941   ALBUMIN 4.4 05/14/2018 0941   AST 18 05/14/2018 0941   ALT 23 05/14/2018 0941   ALKPHOS 79 05/14/2018 0941   BILITOT 0.3 05/14/2018 0941      Component Value Date/Time   TSH 5.150 (H) 05/14/2018 0941   Results for LEXXUS, UNDERHILL (MRN [id number]) as of 10/08/2018 15:45  Ref. Range 05/14/2018 09:41  Vitamin D, 25-Hydroxy Latest Ref Range: 30.0 - 100.0 ng/mL 27.9 (L)   OBESITY BEHAVIORAL INTERVENTION VISIT  Today's visit was #10   Starting weight: 281 lbs Starting date: 05/14/2018 Today's weight: 263 lbs  Today's date: 10/08/2018 Total lbs lost to date: 18 At least 15 minutes were spent on discussing the following behavioral intervention visit.    10/08/2018  Height 5' 4" (1.626 m)  Weight 263 lb (119.3 kg)  BMI (Calculated) 45.12  BLOOD PRESSURE - SYSTOLIC 283  BLOOD PRESSURE - DIASTOLIC 78   Body Fat % 15.1 %  Total Body Water (lbs) 93 lbs   ASK: We discussed the diagnosis of obesity with Alyson Ingles today and Naveah agreed to give [name] permission to discuss obesity behavioral modification therapy today.  ASSESS: Pattijo has the diagnosis of obesity and her BMI today is 45.12. Sydell is in the action stage of change.   ADVISE: Jolan was educated on the multiple health risks of obesity as well as the benefit of weight loss to improve her health. She was advised of the need for long term treatment and the importance of lifestyle modifications to improve her current health and to decrease her risk of future health problems.  AGREE: Multiple dietary modification options and treatment options were discussed and  Gudelia agreed to follow the recommendations documented in the above note.  ARRANGE: Caili was educated on the importance of frequent visits to treat obesity as outlined  per CMS and USPSTF guidelines and agreed to schedule her next follow up appointment today.  Migdalia Dk, am acting as transcriptionist for Abby Potash, PA-C I, Abby Potash, PA-C have reviewed above note and agree with its content

## 2018-10-09 LAB — COMPREHENSIVE METABOLIC PANEL
ALBUMIN: 4.5 g/dL (ref 3.8–4.9)
ALT: 20 IU/L (ref 0–32)
AST: 11 IU/L (ref 0–40)
Albumin/Globulin Ratio: 1.7 (ref 1.2–2.2)
Alkaline Phosphatase: 74 IU/L (ref 39–117)
BUN/Creatinine Ratio: 28 — ABNORMAL HIGH (ref 9–23)
BUN: 21 mg/dL (ref 6–24)
Bilirubin Total: 0.4 mg/dL (ref 0.0–1.2)
CHLORIDE: 99 mmol/L (ref 96–106)
CO2: 25 mmol/L (ref 20–29)
Calcium: 9.6 mg/dL (ref 8.7–10.2)
Creatinine, Ser: 0.76 mg/dL (ref 0.57–1.00)
GFR, EST AFRICAN AMERICAN: 99 mL/min/{1.73_m2} (ref >59)
GFR, EST NON AFRICAN AMERICAN: 86 mL/min/{1.73_m2} (ref >59)
GLOBULIN, TOTAL: 2.6 g/dL (ref 1.5–4.5)
GLUCOSE: 131 mg/dL — AB (ref 65–99)
POTASSIUM: 4.5 mmol/L (ref 3.5–5.2)
SODIUM: 138 mmol/L (ref 134–144)
TOTAL PROTEIN: 7.1 g/dL (ref 6.0–8.5)

## 2018-10-09 LAB — VITAMIN D 25 HYDROXY (VIT D DEFICIENCY, FRACTURES): Vit D, 25-Hydroxy: 30.2 ng/mL (ref 30.0–100.0)

## 2018-10-09 LAB — LIPID PANEL WITH LDL/HDL RATIO
Cholesterol, Total: 188 mg/dL (ref 100–199)
HDL: 71 mg/dL (ref >39)
LDL Calculated: 83 mg/dL (ref 0–99)
LDL/HDL RATIO: 1.2 ratio (ref 0.0–3.2)
Triglycerides: 168 mg/dL — ABNORMAL HIGH (ref 0–149)
VLDL CHOLESTEROL CAL: 34 mg/dL (ref 5–40)

## 2018-10-09 LAB — INSULIN, RANDOM: INSULIN: 6.6 u[IU]/mL (ref 2.6–24.9)

## 2018-10-09 LAB — HEMOGLOBIN A1C
Est. average glucose Bld gHb Est-mCnc: 143 mg/dL
Hgb A1c MFr Bld: 6.6 % — ABNORMAL HIGH (ref 4.8–5.6)

## 2018-10-15 ENCOUNTER — Encounter (INDEPENDENT_AMBULATORY_CARE_PROVIDER_SITE_OTHER): Payer: Self-pay | Admitting: Physician Assistant

## 2018-10-22 ENCOUNTER — Encounter (INDEPENDENT_AMBULATORY_CARE_PROVIDER_SITE_OTHER): Payer: Self-pay

## 2018-10-23 ENCOUNTER — Encounter (INDEPENDENT_AMBULATORY_CARE_PROVIDER_SITE_OTHER): Payer: Self-pay

## 2018-10-24 ENCOUNTER — Other Ambulatory Visit: Payer: Self-pay

## 2018-10-24 ENCOUNTER — Ambulatory Visit (INDEPENDENT_AMBULATORY_CARE_PROVIDER_SITE_OTHER): Payer: Medicare Other | Admitting: Physician Assistant

## 2018-10-24 ENCOUNTER — Encounter (INDEPENDENT_AMBULATORY_CARE_PROVIDER_SITE_OTHER): Payer: Self-pay | Admitting: Physician Assistant

## 2018-10-24 DIAGNOSIS — Z6841 Body Mass Index (BMI) 40.0 and over, adult: Secondary | ICD-10-CM | POA: Diagnosis not present

## 2018-10-24 DIAGNOSIS — E559 Vitamin D deficiency, unspecified: Secondary | ICD-10-CM

## 2018-10-24 MED ORDER — VITAMIN D (ERGOCALCIFEROL) 1.25 MG (50000 UNIT) PO CAPS: 50000.0000 [IU] | ORAL_CAPSULE | ORAL | 0 refills | Status: DC

## 2018-10-28 NOTE — Progress Notes (Addendum)
Office: [phone]  /  Fax: [phone] TeleHealth Visit:  JONI COLEGROVE has consented to this TeleHealth visit today via FaceTime. The patient is located at work, the provider is located at the [employer] [facility] office. The participants in this visit include the listed provider and patient.   HPI:   Chief Complaint: OBESITY Shahd is here to discuss her progress with her obesity treatment plan. She is on the Category 3 plan and is following her eating plan approximately 50% of the time. She states she is exercising 0 minutes 0 times per week. Jakya reports that she has maintained her weight. She states she is waking up later and is sometimes skipping meals. She reports having leg and back pain and is also constipated. Mariette states her blood sugars are 130 after eating. We were unable to weigh the patient today for this TeleHealth visit.She feels as if she has maintained her weight since her last visit. She has lost 18 lbs since starting treatment with [name].  Vitamin D deficiency Bessie has a diagnosis of Vitamin D deficiency. She is currently taking prescription Vit D and denies nausea, vomiting or muscle weakness.  ASSESSMENT AND PLAN:  Vitamin D deficiency - Plan: Vitamin D, Ergocalciferol, (DRISDOL) 1.25 MG (50000 UT) CAPS capsule  Class 3 severe obesity with serious comorbidity and body mass index (BMI) of 45.0 to 49.9 in adult, unspecified obesity type ([place])  PLAN:  Vitamin D Deficiency Stacee was informed that low Vitamin D levels contributes to fatigue and are associated with obesity, breast, and colon cancer. She agrees to continue to take prescription Vit D @ 50,000 IU every three days #10 with 0 refills (90-day mail order) and will follow-up for routine testing of Vitamin D, at least 2-3 times per year. She was informed of the risk of over-replacement of Vitamin D and agrees to not increase her dose unless she discusses this with [name] first. Timika agrees to  follow-up with our clinic in 2 weeks.  I spent > than 50% of the 25 minute visit on counseling as documented in the note.  Obesity Dai is currently in the action stage of change. As such, her goal is to continue with weight loss efforts. She has agreed to follow the Category 3 plan. Kimyatta has been instructed to work up to a goal of 150 minutes of combined cardio and strengthening exercise per week for weight loss and overall health benefits. We discussed the following Behavioral Modification Strategies today: work on meal planning, easy cooking plans, and keeping healthy foods in the home.  Colena has agreed to follow-up with our clinic in 2 weeks. She was informed of the importance of frequent follow-up visits to maximize her success with intensive lifestyle modifications for her multiple health conditions.  I spent > than 50% of the 25 minute visit on counseling as documented in the note.   ALLERGIES: No Known Allergies  MEDICATIONS: Current Outpatient Medications on File Prior to Visit  Medication Sig Dispense Refill  . albuterol (PROVENTIL HFA;VENTOLIN HFA) 108 (90 BASE) MCG/ACT inhaler Inhale 2 puffs into the lungs every 6 (six) hours as needed. FOR WHEEZING     . beclomethasone (QVAR) 80 MCG/ACT inhaler Inhale 2 puffs into the lungs 2 (two) times daily.    . blood glucose meter kit and supplies Dispense based on patient and insurance preference. Use up to four times daily as directed. (FOR ICD-10 E10.9, E11.9). Test twice daily 1 each 0  . cyclobenzaprine (FLEXERIL) 10  MG tablet Take 10 mg by mouth 3 (three) times daily as needed for muscle spasms.    . DULoxetine (CYMBALTA) 30 MG capsule Take 30 mg by mouth 3 (three) times daily.    . folic acid (FOLVITE) 1 MG tablet Take 1 mg by mouth at bedtime.   0  . furosemide (LASIX) 20 MG tablet Take 20 mg by mouth daily as needed.    . gabapentin (NEURONTIN) 300 MG capsule Take 300 mg by mouth at bedtime.  0  . meloxicam (MOBIC) 15  MG tablet Take 15 mg by mouth at bedtime.  3  . metFORMIN (GLUCOPHAGE-XR) 500 MG 24 hr tablet Take 1 tablet (500 mg total) by mouth 2 (two) times daily. 60 tablet 0  . methotrexate (RHEUMATREX) 2.5 MG tablet Take 2.5 mg by mouth once a week. Take 8 tablets by mouth once a week on Friday.  3  . montelukast (SINGULAIR) 10 MG tablet Take 10 mg by mouth at bedtime.    . Multiple Vitamins-Minerals (HAIR SKIN & NAILS ADVANCED PO) Take 2 tablets by mouth 2 (two) times daily.    . mupirocin ointment (BACTROBAN) 2 % Place 1 application into the nose 2 (two) times daily as needed.    . predniSONE (DELTASONE) 10 MG tablet Take 10 mg by mouth daily with breakfast.    . traMADol (ULTRAM) 50 MG tablet Take 50 mg by mouth 2 (two) times daily as needed for moderate pain.      No current facility-administered medications on file prior to visit.     PAST MEDICAL HISTORY: Past Medical History:  Diagnosis Date  . ADD (attention deficit disorder)   . Anemia   . Anxiety   . Arthritis   . Asthma   . Back pain   . CFS (chronic fatigue syndrome)   . Colitis   . Depression   . Diabetes ([place])   . Dyspnea   . Fibromyalgia   . GERD (gastroesophageal reflux disease)   . Hx of blood clots   . IBS (irritable bowel syndrome)   . Joint pain   . Left knee pain   . Leg edema   . Osteoarthritis   . Prediabetes   . Pulmonary embolism ([place])   . RA (rheumatoid arthritis) ([place])   . RLS (restless legs syndrome)   . Sleep apnea   . Vitamin B 12 deficiency   . Vitamin D deficiency     PAST SURGICAL HISTORY: Past Surgical History:  Procedure Laterality Date  . EYE SURGERY    . MOUTH SURGERY      SOCIAL HISTORY: Social History   Tobacco Use  . Smoking status: Former Smoker    Packs/day: 1.50    Years: 8.00    Pack years: 12.00  . Smokeless tobacco: Never Used  Substance Use Topics  . Alcohol use: No    Alcohol/week: 0.0 standard drinks  . Drug use: No    FAMILY HISTORY: Family History  Problem  Relation Age of Onset  . Pneumonia Mother   . Asthma Mother   . Diabetes Mother   . Hypertension Mother   . Depression Mother   . Anxiety disorder Mother   . Eating disorder Mother   . Obesity Mother   . Sudden death Mother   . Cancer Father        Brain tumor  . Asthma Father    ROS: Review of Systems  Gastrointestinal: Positive for constipation. Negative for nausea and vomiting.  Musculoskeletal:  Positive for back pain.       Negative for muscle weakness. Positive for leg pain.   PHYSICAL EXAM: Pt in no acute distress  RECENT LABS AND TESTS: BMET    Component Value Date/Time   NA 138 10/08/2018 1421   K 4.5 10/08/2018 1421   CL 99 10/08/2018 1421   CO2 25 10/08/2018 1421   GLUCOSE 131 (H) 10/08/2018 1421   GLUCOSE 130 (H) 11/06/2017 0222   BUN 21 10/08/2018 1421   CREATININE 0.76 10/08/2018 1421   CALCIUM 9.6 10/08/2018 1421   GFRNONAA 86 10/08/2018 1421   GFRAA 99 10/08/2018 1421   Lab Results  Component Value Date   HGBA1C 6.6 (H) 10/08/2018   HGBA1C 6.8 (H) 05/14/2018   Lab Results  Component Value Date   INSULIN 6.6 10/08/2018   INSULIN 9.8 05/14/2018   CBC    Component Value Date/Time   WBC 8.0 05/14/2018 0941   WBC 10.1 11/06/2017 0222   RBC 3.97 05/14/2018 0941   RBC 4.31 11/06/2017 0222   HGB 12.0 05/14/2018 0941   HCT 37.4 05/14/2018 0941   PLT 380 11/06/2017 0222   MCV 94 05/14/2018 0941   MCH 30.2 05/14/2018 0941   MCH 29.0 11/06/2017 0222   MCHC 32.1 05/14/2018 0941   MCHC 32.3 11/06/2017 0222   RDW 14.1 05/14/2018 0941   LYMPHSABS 1.9 05/14/2018 0941   MONOABS 0.6 11/06/2017 0222   EOSABS 0.2 05/14/2018 0941   BASOSABS 0.1 05/14/2018 0941   Iron/TIBC/Ferritin/ %Sat No results found for: IRON, TIBC, FERRITIN, IRONPCTSAT Lipid Panel     Component Value Date/Time   CHOL 188 10/08/2018 1421   TRIG 168 (H) 10/08/2018 1421   HDL 71 10/08/2018 1421   LDLCALC 83 10/08/2018 1421   Hepatic Function Panel     Component Value  Date/Time   PROT 7.1 10/08/2018 1421   ALBUMIN 4.5 10/08/2018 1421   AST 11 10/08/2018 1421   ALT 20 10/08/2018 1421   ALKPHOS 74 10/08/2018 1421   BILITOT 0.4 10/08/2018 1421      Component Value Date/Time   TSH 5.150 (H) 05/14/2018 0941   Results for DAJANEE, VOORHEIS (MRN [id number]) as of 10/28/2018 13:06  Ref. Range 10/08/2018 14:21  Vitamin D, 25-Hydroxy Latest Ref Range: 30.0 - 100.0 ng/mL 30.2    I, Michaelene Song, am acting as Location manager for [employer], PA-C I, Abby Potash, PA-C have reviewed above note and agree with its content

## 2018-11-07 ENCOUNTER — Ambulatory Visit (INDEPENDENT_AMBULATORY_CARE_PROVIDER_SITE_OTHER): Payer: Medicare Other | Admitting: Physician Assistant

## 2018-11-12 ENCOUNTER — Other Ambulatory Visit (INDEPENDENT_AMBULATORY_CARE_PROVIDER_SITE_OTHER): Payer: Self-pay | Admitting: Physician Assistant

## 2018-11-12 DIAGNOSIS — E559 Vitamin D deficiency, unspecified: Secondary | ICD-10-CM

## 2018-11-14 ENCOUNTER — Ambulatory Visit (INDEPENDENT_AMBULATORY_CARE_PROVIDER_SITE_OTHER): Payer: Medicare Other | Admitting: Physician Assistant

## 2018-11-14 ENCOUNTER — Other Ambulatory Visit: Payer: Self-pay

## 2018-11-14 ENCOUNTER — Encounter (INDEPENDENT_AMBULATORY_CARE_PROVIDER_SITE_OTHER): Payer: Self-pay | Admitting: Physician Assistant

## 2018-11-14 DIAGNOSIS — E119 Type 2 diabetes mellitus without complications: Secondary | ICD-10-CM

## 2018-11-14 DIAGNOSIS — Z6841 Body Mass Index (BMI) 40.0 and over, adult: Secondary | ICD-10-CM

## 2018-11-14 NOTE — Progress Notes (Signed)
 Office: [phone]  /  Fax: [phone] TeleHealth Visit:  Sally [name] has verbally consented to this TeleHealth visit today. The patient is located at home, the provider is located at the [facility] office. The participants in this visit include the listed provider and patient and any and all parties involved. The visit was conducted today via FaceTime.  HPI:   Chief Complaint: OBESITY Sally [name] is here to discuss her progress with her obesity treatment plan. She is on the Category 3 plan and is following her eating plan approximately 50 % of the time. She states she is exercising 0 minutes 0 times per week. Sally [name] reports that she has been doing stress and boredom eating, since she was furloughed from her job. Her legs have been swollen for the last few days after drinking sodas and eating simple carbohydrates. We were unable to weigh the patient today for this TeleHealth visit. She feels as if she has maintained weight since her last visit. She has lost 18 lbs since starting treatment with us.  Diabetes II Sally [name] has a diagnosis of diabetes type II. Sally [name] states she is checking blood sugars post prandial and her post prandial BGs range between 130 and150's. Last A1c was at 6.6 Sally [name] is on metformin and she denies nausea, vomiting and diarrhea. She has been working on intensive lifestyle modifications including diet, exercise, and weight loss to help control her blood glucose levels.   ASSESSMENT AND PLAN:  Type 2 diabetes mellitus without complication, without long-term current use of insulin (HCC)  Class 3 severe obesity with serious comorbidity and body mass index (BMI) of 45.0 to 49.9 in adult, unspecified obesity type (HCC)  PLAN:  Diabetes II Sally [name] has been given extensive diabetes education by myself today including ideal fasting and post-prandial blood glucose readings, individual ideal Hgb A1c goals and hypoglycemia prevention. We discussed the  importance of good blood sugar control to decrease the likelihood of diabetic complications such as nephropathy, neuropathy, limb loss, blindness, coronary artery disease, and death. We discussed the importance of intensive lifestyle modification including diet, exercise and weight loss as the first line treatment for diabetes. Marvel agrees to continue with metformin and weight loss. She will follow up at the agreed upon time.  Obesity Sally [name] is currently in the action stage of change. As such, her goal is to continue with weight loss efforts She has agreed to keep a food journal with 1500 calories and 95 grams of protein daily Sally [name] has been instructed to work up to a goal of 150 minutes of combined cardio and strengthening exercise per week for weight loss and overall health benefits. We discussed the following Behavioral Modification Strategies today: increasing lean protein intake, decreasing simple carbohydrates  and work on meal planning and easy cooking plans  Sally [name] has agreed to follow up with our clinic in 3 weeks. She was informed of the importance of frequent follow up visits to maximize her success with intensive lifestyle modifications for her multiple health conditions.  ALLERGIES: No Known Allergies  MEDICATIONS: Current Outpatient Medications on File Prior to Visit  Medication Sig Dispense Refill  . albuterol (PROVENTIL HFA;VENTOLIN HFA) 108 (90 BASE) MCG/ACT inhaler Inhale 2 puffs into the lungs every 6 (six) hours as needed. FOR WHEEZING     . beclomethasone (QVAR) 80 MCG/ACT inhaler Inhale 2 puffs into the lungs 2 (two) times daily.    . blood glucose meter kit and supplies Dispense based on patient and insurance preference. Use   up to four times daily as directed. (FOR ICD-10 E10.9, E11.9). Test twice daily 1 each 0  . cyclobenzaprine (FLEXERIL) 10 MG tablet Take 10 mg by mouth 3 (three) times daily as needed for muscle spasms.    . DULoxetine (CYMBALTA) 30 MG capsule Take  30 mg by mouth 3 (three) times daily.    . folic acid (FOLVITE) 1 MG tablet Take 1 mg by mouth at bedtime.   0  . furosemide (LASIX) 20 MG tablet Take 20 mg by mouth daily as needed.    . gabapentin (NEURONTIN) 300 MG capsule Take 300 mg by mouth at bedtime.  0  . meloxicam (MOBIC) 15 MG tablet Take 15 mg by mouth at bedtime.  3  . metFORMIN (GLUCOPHAGE-XR) 500 MG 24 hr tablet Take 1 tablet (500 mg total) by mouth 2 (two) times daily. 60 tablet 0  . methotrexate (RHEUMATREX) 2.5 MG tablet Take 2.5 mg by mouth once a week. Take 8 tablets by mouth once a week on Friday.  3  . montelukast (SINGULAIR) 10 MG tablet Take 10 mg by mouth at bedtime.    . Multiple Vitamins-Minerals (HAIR SKIN & NAILS ADVANCED PO) Take 2 tablets by mouth 2 (two) times daily.    . mupirocin ointment (BACTROBAN) 2 % Place 1 application into the nose 2 (two) times daily as needed.    . predniSONE (DELTASONE) 10 MG tablet Take 10 mg by mouth daily with breakfast.    . traMADol (ULTRAM) 50 MG tablet Take 50 mg by mouth 2 (two) times daily as needed for moderate pain.     . Vitamin D, Ergocalciferol, (DRISDOL) 1.25 MG (50000 UT) CAPS capsule Take 1 capsule (50,000 Units total) by mouth every 3 (three) days. On Friday 10 capsule 0   No current facility-administered medications on file prior to visit.     PAST MEDICAL HISTORY: Past Medical History:  Diagnosis Date  . ADD (attention deficit disorder)   . Anemia   . Anxiety   . Arthritis   . Asthma   . Back pain   . CFS (chronic fatigue syndrome)   . Colitis   . Depression   . Diabetes (HCC)   . Dyspnea   . Fibromyalgia   . GERD (gastroesophageal reflux disease)   . Hx of blood clots   . IBS (irritable bowel syndrome)   . Joint pain   . Left knee pain   . Leg edema   . Osteoarthritis   . Prediabetes   . Pulmonary embolism (HCC)   . RA (rheumatoid arthritis) (HCC)   . RLS (restless legs syndrome)   . Sleep apnea   . Vitamin B 12 deficiency   . Vitamin D  deficiency     PAST SURGICAL HISTORY: Past Surgical History:  Procedure Laterality Date  . EYE SURGERY    . MOUTH SURGERY      SOCIAL HISTORY: Social History   Tobacco Use  . Smoking status: Former Smoker    Packs/day: 1.50    Years: 8.00    Pack years: 12.00  . Smokeless tobacco: Never Used  Substance Use Topics  . Alcohol use: No    Alcohol/week: 0.0 standard drinks  . Drug use: No    FAMILY HISTORY: Family History  Problem Relation Age of Onset  . Pneumonia Mother   . Asthma Mother   . Diabetes Mother   . Hypertension Mother   . Depression Mother   . Anxiety disorder Mother   .   Eating disorder Mother   . Obesity Mother   . Sudden death Mother   . Cancer Father        Brain tumor  . Asthma Father     ROS: Review of Systems  Constitutional: Negative for weight loss.  Gastrointestinal: Negative for diarrhea, nausea and vomiting.    PHYSICAL EXAM: Pt in no acute distress  RECENT LABS AND TESTS: BMET    Component Value Date/Time   NA 138 10/08/2018 1421   K 4.5 10/08/2018 1421   CL 99 10/08/2018 1421   CO2 25 10/08/2018 1421   GLUCOSE 131 (H) 10/08/2018 1421   GLUCOSE 130 (H) 11/06/2017 0222   BUN 21 10/08/2018 1421   CREATININE 0.76 10/08/2018 1421   CALCIUM 9.6 10/08/2018 1421   GFRNONAA 86 10/08/2018 1421   GFRAA 99 10/08/2018 1421   Lab Results  Component Value Date   HGBA1C 6.6 (H) 10/08/2018   HGBA1C 6.8 (H) 05/14/2018   Lab Results  Component Value Date   INSULIN 6.6 10/08/2018   INSULIN 9.8 05/14/2018   CBC    Component Value Date/Time   WBC 8.0 05/14/2018 0941   WBC 10.1 11/06/2017 0222   RBC 3.97 05/14/2018 0941   RBC 4.31 11/06/2017 0222   HGB 12.0 05/14/2018 0941   HCT 37.4 05/14/2018 0941   PLT 380 11/06/2017 0222   MCV 94 05/14/2018 0941   MCH 30.2 05/14/2018 0941   MCH 29.0 11/06/2017 0222   MCHC 32.1 05/14/2018 0941   MCHC 32.3 11/06/2017 0222   RDW 14.1 05/14/2018 0941   LYMPHSABS 1.9 05/14/2018 0941    MONOABS 0.6 11/06/2017 0222   EOSABS 0.2 05/14/2018 0941   BASOSABS 0.1 05/14/2018 0941   Iron/TIBC/Ferritin/ %Sat No results found for: IRON, TIBC, FERRITIN, IRONPCTSAT Lipid Panel     Component Value Date/Time   CHOL 188 10/08/2018 1421   TRIG 168 (H) 10/08/2018 1421   HDL 71 10/08/2018 1421   LDLCALC 83 10/08/2018 1421   Hepatic Function Panel     Component Value Date/Time   PROT 7.1 10/08/2018 1421   ALBUMIN 4.5 10/08/2018 1421   AST 11 10/08/2018 1421   ALT 20 10/08/2018 1421   ALKPHOS 74 10/08/2018 1421   BILITOT 0.4 10/08/2018 1421      Component Value Date/Time   TSH 5.150 (H) 05/14/2018 0941    Results for Mazurek, Lema C (MRN [id number]) as of 11/14/2018 16:51  Ref. Range 10/08/2018 14:21  Vitamin D, 25-Hydroxy Latest Ref Range: 30.0 - 100.0 ng/mL 30.2    I, Joanne Murray, am acting as transcriptionist for Tracey Aguilar, PA-C I, Tracey Aguilar, PA-C have reviewed above note and agree with its content  

## 2018-12-03 ENCOUNTER — Encounter (INDEPENDENT_AMBULATORY_CARE_PROVIDER_SITE_OTHER): Payer: Self-pay | Admitting: Physician Assistant

## 2018-12-03 ENCOUNTER — Other Ambulatory Visit: Payer: Self-pay

## 2018-12-03 ENCOUNTER — Ambulatory Visit (INDEPENDENT_AMBULATORY_CARE_PROVIDER_SITE_OTHER): Payer: Medicare Other | Admitting: Physician Assistant

## 2018-12-03 DIAGNOSIS — Z6841 Body Mass Index (BMI) 40.0 and over, adult: Secondary | ICD-10-CM | POA: Diagnosis not present

## 2018-12-03 DIAGNOSIS — E119 Type 2 diabetes mellitus without complications: Secondary | ICD-10-CM | POA: Diagnosis not present

## 2018-12-03 DIAGNOSIS — E559 Vitamin D deficiency, unspecified: Secondary | ICD-10-CM

## 2018-12-03 MED ORDER — METFORMIN HCL ER 500 MG PO TB24: 500.0000 mg | ORAL_TABLET | Freq: Two times a day (BID) | ORAL | 0 refills | Status: DC

## 2018-12-03 MED ORDER — VITAMIN D (ERGOCALCIFEROL) 1.25 MG (50000 UNIT) PO CAPS: 50000.0000 [IU] | ORAL_CAPSULE | ORAL | 0 refills | Status: DC

## 2018-12-04 NOTE — Progress Notes (Signed)
Office: [phone]  /  Fax: [phone] TeleHealth Visit:  Sally [name] has verbally consented to this TeleHealth visit today. The patient is located at home, the provider is located at the [facility] office. The participants in this visit include the listed provider and patient and any and all parties involved. The visit was conducted today via FaceTime.  HPI:   Chief Complaint: OBESITY Sally [name] is here to discuss her progress with her obesity treatment plan. She is on the keep a food journal with 1500 calories and 95 grams of protein and the Category 3 plan and is following her eating plan approximately 85 % of the time. She states she is exercising 0 minutes 0 times per week. Gladyse reports that she is doing better with getting all of her protein in by eating extra eggs and fish. She is having cravings for sweets, especially after dinner. We were unable to weigh the patient today for this TeleHealth visit. She feels as if she has lost weight since her last visit. She has lost 20 lbs since starting treatment with Sally [name].  Diabetes II [name] has a diagnosis of diabetes type II. She is on metformin and she denies nausea, vomiting or diarrhea. [name] states fasting BGs range between 130 and 150. [name] denies any hypoglycemic episodes or polyphagia. Last A1c was at 6.6 She has been working on intensive lifestyle modifications including diet, exercise, and weight loss to help control her blood glucose levels.  Vitamin D deficiency [name] has a diagnosis of vitamin D deficiency. She is on vit D and denies nausea, vomiting or muscle weakness.  ASSESSMENT AND PLAN:  Type 2 diabetes mellitus without complication, without long-term current use of insulin ([place]) - Plan: metFORMIN (GLUCOPHAGE-XR) 500 MG 24 hr tablet  Vitamin D deficiency - Plan: Vitamin D, Ergocalciferol, (DRISDOL) 1.25 MG (50000 UT) CAPS capsule  Class 3 severe obesity with serious comorbidity and body mass index  (BMI) of 45.0 to 49.9 in adult, unspecified obesity type ([place])  PLAN:  Diabetes II [name] has been given extensive diabetes education by myself today including ideal fasting and post-prandial blood glucose readings, individual ideal Hgb A1c goals and hypoglycemia prevention. We discussed the importance of good blood sugar control to decrease the likelihood of diabetic complications such as nephropathy, neuropathy, limb loss, blindness, coronary artery disease, and death. We discussed the importance of intensive lifestyle modification including diet, exercise and weight loss as the first line treatment for diabetes. [name] agrees to continue metformin 500 mg BID #180 and follow up at the agreed upon time.  Vitamin D Deficiency [name] was informed that low vitamin D levels contributes to fatigue and are associated with obesity, breast, and colon cancer. She agrees to continue to take prescription Vit D @50 ,000 IU every week #30 with no refills  and will follow up for routine testing of vitamin D, at least 2-3 times per year. She was informed of the risk of over-replacement of vitamin D and agrees to not increase her dose unless she discusses this with Sally [name] first. [name] agrees to follow up as directed.  Obesity [name] is currently in the action stage of change. As such, her goal is to continue with weight loss efforts She has agreed to keep a food journal with 1500 calories and 95 grams of protein daily and follow the Category 3 plan [name] has been instructed to work up to a goal of 150 minutes of combined cardio and strengthening exercise per week for weight loss  and overall health benefits. We discussed the following Behavioral Modification Strategies today: keeping healthy foods in the home and work on meal planning and easy cooking plans  [name] has agreed to follow up with our clinic in 2 weeks. She was informed of the importance of frequent follow up visits to maximize her success with intensive  lifestyle modifications for her multiple health conditions.  ALLERGIES: No Known Allergies  MEDICATIONS: Current Outpatient Medications on File Prior to Visit  Medication Sig Dispense Refill  . albuterol (PROVENTIL HFA;VENTOLIN HFA) 108 (90 BASE) MCG/ACT inhaler Inhale 2 puffs into the lungs every 6 (six) hours as needed. FOR WHEEZING     . beclomethasone (QVAR) 80 MCG/ACT inhaler Inhale 2 puffs into the lungs 2 (two) times daily.    . blood glucose meter kit and supplies Dispense based on patient and insurance preference. Use up to four times daily as directed. (FOR ICD-10 E10.9, E11.9). Test twice daily 1 each 0  . cyclobenzaprine (FLEXERIL) 10 MG tablet Take 10 mg by mouth 3 (three) times daily as needed for muscle spasms.    . DULoxetine (CYMBALTA) 30 MG capsule Take 30 mg by mouth 3 (three) times daily.    . folic acid (FOLVITE) 1 MG tablet Take 1 mg by mouth at bedtime.   0  . furosemide (LASIX) 20 MG tablet Take 20 mg by mouth daily as needed.    . gabapentin (NEURONTIN) 300 MG capsule Take 300 mg by mouth at bedtime.  0  . meloxicam (MOBIC) 15 MG tablet Take 15 mg by mouth at bedtime.  3  . methotrexate (RHEUMATREX) 2.5 MG tablet Take 2.5 mg by mouth once a week. Take 8 tablets by mouth once a week on Friday.  3  . montelukast (SINGULAIR) 10 MG tablet Take 10 mg by mouth at bedtime.    . Multiple Vitamins-Minerals (HAIR SKIN & NAILS ADVANCED PO) Take 2 tablets by mouth 2 (two) times daily.    . mupirocin ointment (BACTROBAN) 2 % Place 1 application into the nose 2 (two) times daily as needed.    . predniSONE (DELTASONE) 10 MG tablet Take 10 mg by mouth daily with breakfast.    . traMADol (ULTRAM) 50 MG tablet Take 50 mg by mouth 2 (two) times daily as needed for moderate pain.      No current facility-administered medications on file prior to visit.     PAST MEDICAL HISTORY: Past Medical History:  Diagnosis Date  . ADD (attention deficit disorder)   . Anemia   . Anxiety   .  Arthritis   . Asthma   . Back pain   . CFS (chronic fatigue syndrome)   . Colitis   . Depression   . Diabetes ([place])   . Dyspnea   . Fibromyalgia   . GERD (gastroesophageal reflux disease)   . Hx of blood clots   . IBS (irritable bowel syndrome)   . Joint pain   . Left knee pain   . Leg edema   . Osteoarthritis   . Prediabetes   . Pulmonary embolism ([place])   . RA (rheumatoid arthritis) ([place])   . RLS (restless legs syndrome)   . Sleep apnea   . Vitamin B 12 deficiency   . Vitamin D deficiency     PAST SURGICAL HISTORY: Past Surgical History:  Procedure Laterality Date  . EYE SURGERY    . MOUTH SURGERY      SOCIAL HISTORY: Social History   Tobacco Use  .  Smoking status: Former Smoker    Packs/day: 1.50    Years: 8.00    Pack years: 12.00  . Smokeless tobacco: Never Used  Substance Use Topics  . Alcohol use: No    Alcohol/week: 0.0 standard drinks  . Drug use: No    FAMILY HISTORY: Family History  Problem Relation Age of Onset  . Pneumonia Mother   . Asthma Mother   . Diabetes Mother   . Hypertension Mother   . Depression Mother   . Anxiety disorder Mother   . Eating disorder Mother   . Obesity Mother   . Sudden death Mother   . Cancer Father        Brain tumor  . Asthma Father     ROS: Review of Systems  Constitutional: Positive for weight loss.  Gastrointestinal: Negative for diarrhea, nausea and vomiting.  Musculoskeletal:       Negative for muscle weakness  Endo/Heme/Allergies:       Negative for polyphagia Negative for hypoglycemia    PHYSICAL EXAM: Pt in no acute distress  RECENT LABS AND TESTS: BMET    Component Value Date/Time   NA 138 10/08/2018 1421   K 4.5 10/08/2018 1421   CL 99 10/08/2018 1421   CO2 25 10/08/2018 1421   GLUCOSE 131 (H) 10/08/2018 1421   GLUCOSE 130 (H) 11/06/2017 0222   BUN 21 10/08/2018 1421   CREATININE 0.76 10/08/2018 1421   CALCIUM 9.6 10/08/2018 1421   GFRNONAA 86 10/08/2018 1421   GFRAA 99  10/08/2018 1421   Lab Results  Component Value Date   HGBA1C 6.6 (H) 10/08/2018   HGBA1C 6.8 (H) 05/14/2018   Lab Results  Component Value Date   INSULIN 6.6 10/08/2018   INSULIN 9.8 05/14/2018   CBC    Component Value Date/Time   WBC 8.0 05/14/2018 0941   WBC 10.1 11/06/2017 0222   RBC 3.97 05/14/2018 0941   RBC 4.31 11/06/2017 0222   HGB 12.0 05/14/2018 0941   HCT 37.4 05/14/2018 0941   PLT 380 11/06/2017 0222   MCV 94 05/14/2018 0941   MCH 30.2 05/14/2018 0941   MCH 29.0 11/06/2017 0222   MCHC 32.1 05/14/2018 0941   MCHC 32.3 11/06/2017 0222   RDW 14.1 05/14/2018 0941   LYMPHSABS 1.9 05/14/2018 0941   MONOABS 0.6 11/06/2017 0222   EOSABS 0.2 05/14/2018 0941   BASOSABS 0.1 05/14/2018 0941   Iron/TIBC/Ferritin/ %Sat No results found for: IRON, TIBC, FERRITIN, IRONPCTSAT Lipid Panel     Component Value Date/Time   CHOL 188 10/08/2018 1421   TRIG 168 (H) 10/08/2018 1421   HDL 71 10/08/2018 1421   LDLCALC 83 10/08/2018 1421   Hepatic Function Panel     Component Value Date/Time   PROT 7.1 10/08/2018 1421   ALBUMIN 4.5 10/08/2018 1421   AST 11 10/08/2018 1421   ALT 20 10/08/2018 1421   ALKPHOS 74 10/08/2018 1421   BILITOT 0.4 10/08/2018 1421      Component Value Date/Time   TSH 5.150 (H) 05/14/2018 0941     Ref. Range 10/08/2018 14:21  Vitamin D, 25-Hydroxy Latest Ref Range: 30.0 - 100.0 ng/mL 30.2    I, Doreene Nest, am acting as Location manager for Abby Potash, PA-C I, Abby Potash, PA-C have reviewed above note and agree with its content

## 2018-12-18 ENCOUNTER — Ambulatory Visit (INDEPENDENT_AMBULATORY_CARE_PROVIDER_SITE_OTHER): Payer: Medicare Other | Admitting: Physician Assistant

## 2018-12-18 ENCOUNTER — Encounter (INDEPENDENT_AMBULATORY_CARE_PROVIDER_SITE_OTHER): Payer: Self-pay | Admitting: Physician Assistant

## 2018-12-18 ENCOUNTER — Other Ambulatory Visit: Payer: Self-pay

## 2018-12-18 DIAGNOSIS — E559 Vitamin D deficiency, unspecified: Secondary | ICD-10-CM

## 2018-12-18 DIAGNOSIS — Z6841 Body Mass Index (BMI) 40.0 and over, adult: Secondary | ICD-10-CM

## 2018-12-18 NOTE — Progress Notes (Signed)
 Office: [phone]  /  Fax: [phone] TeleHealth Visit:  Sally [name] has verbally consented to this TeleHealth visit today. The patient is located at home, the provider is located at the [facility] office. The participants in this visit include the listed provider and patient. The visit was conducted today via FaceTime.  HPI:   Chief Complaint: OBESITY Sally [name] is here to discuss her progress with her obesity treatment plan. She is on the Category 3 plan (forgot to journal 1500 calories + 95 grams of protein) and is following her eating plan approximately 80% of the time. She states she walked 30 minutes 2 times last week and once this week. Sally [name] reports that she is undereating her protein during the day. She states she is sometimes skipping lunch. We were unable to weigh the patient today for this TeleHealth visit. She feels as if she has gained a few lbs since her last visit. She has lost 18 lbs since starting treatment with us.  Vitamin D deficiency Sally [name] has a diagnosis of Vitamin D deficiency. She is currently taking Vit D and denies nausea, vomiting or muscle weakness.  ASSESSMENT AND PLAN:  Vitamin D deficiency  Class 3 severe obesity with serious comorbidity and body mass index (BMI) of 45.0 to 49.9 in adult, unspecified obesity type (HCC)  PLAN:  Vitamin D Deficiency Sally [name] was informed that low Vitamin D levels contributes to fatigue and are associated with obesity, breast, and colon cancer. She agrees to continue taking Vit D and will follow-up for routine testing of Vitamin D, at least 2-3 times per year. She was informed of the risk of over-replacement of Vitamin D and agrees to not increase her dose unless she discusses this with us first. Sally [name] agrees to follow-up with our clinic in 2 weeks.  Obesity Sally [name] is currently in the action stage of change. As such, her goal is to continue with weight loss efforts. She has agreed to follow the  Category 3 plan or journaling 1500 calories + 95 grams of protein. Sally [name] has been instructed to work up to a goal of 150 minutes of combined cardio and strengthening exercise per week for weight loss and overall health benefits. We discussed the following Behavioral Modification Strategies today: increasing lean protein intake and no skipping meals.  Sally [name] has agreed to follow-up with our clinic in 2 weeks. She was informed of the importance of frequent follow-up visits to maximize her success with intensive lifestyle modifications for her multiple health conditions.  I spent > than 50% of the 25 minute visit on counseling as documented in the note.   ALLERGIES: No Known Allergies  MEDICATIONS: Current Outpatient Medications on File Prior to Visit  Medication Sig Dispense Refill  . albuterol (PROVENTIL HFA;VENTOLIN HFA) 108 (90 BASE) MCG/ACT inhaler Inhale 2 puffs into the lungs every 6 (six) hours as needed. FOR WHEEZING     . beclomethasone (QVAR) 80 MCG/ACT inhaler Inhale 2 puffs into the lungs 2 (two) times daily.    . blood glucose meter kit and supplies Dispense based on patient and insurance preference. Use up to four times daily as directed. (FOR ICD-10 E10.9, E11.9). Test twice daily 1 each 0  . cyclobenzaprine (FLEXERIL) 10 MG tablet Take 10 mg by mouth 3 (three) times daily as needed for muscle spasms.    . DULoxetine (CYMBALTA) 30 MG capsule Take 30 mg by mouth 3 (three) times daily.    . folic acid (FOLVITE) 1 MG tablet Take   1 mg by mouth at bedtime.   0  . furosemide (LASIX) 20 MG tablet Take 20 mg by mouth daily as needed.    . gabapentin (NEURONTIN) 300 MG capsule Take 300 mg by mouth at bedtime.  0  . meloxicam (MOBIC) 15 MG tablet Take 15 mg by mouth at bedtime.  3  . metFORMIN (GLUCOPHAGE-XR) 500 MG 24 hr tablet Take 1 tablet (500 mg total) by mouth 2 (two) times daily. 180 tablet 0  . methotrexate (RHEUMATREX) 2.5 MG tablet Take 2.5 mg by mouth once a week. Take 8  tablets by mouth once a week on Friday.  3  . montelukast (SINGULAIR) 10 MG tablet Take 10 mg by mouth at bedtime.    . Multiple Vitamins-Minerals (HAIR SKIN & NAILS ADVANCED PO) Take 2 tablets by mouth 2 (two) times daily.    . mupirocin ointment (BACTROBAN) 2 % Place 1 application into the nose 2 (two) times daily as needed.    . predniSONE (DELTASONE) 10 MG tablet Take 10 mg by mouth daily with breakfast.    . traMADol (ULTRAM) 50 MG tablet Take 50 mg by mouth 2 (two) times daily as needed for moderate pain.     . Vitamin D, Ergocalciferol, (DRISDOL) 1.25 MG (50000 UT) CAPS capsule Take 1 capsule (50,000 Units total) by mouth every 3 (three) days. On Friday 30 capsule 0   No current facility-administered medications on file prior to visit.     PAST MEDICAL HISTORY: Past Medical History:  Diagnosis Date  . ADD (attention deficit disorder)   . Anemia   . Anxiety   . Arthritis   . Asthma   . Back pain   . CFS (chronic fatigue syndrome)   . Colitis   . Depression   . Diabetes ([place])   . Dyspnea   . Fibromyalgia   . GERD (gastroesophageal reflux disease)   . Hx of blood clots   . IBS (irritable bowel syndrome)   . Joint pain   . Left knee pain   . Leg edema   . Osteoarthritis   . Prediabetes   . Pulmonary embolism ([place])   . RA (rheumatoid arthritis) ([place])   . RLS (restless legs syndrome)   . Sleep apnea   . Vitamin B 12 deficiency   . Vitamin D deficiency     PAST SURGICAL HISTORY: Past Surgical History:  Procedure Laterality Date  . EYE SURGERY    . MOUTH SURGERY      SOCIAL HISTORY: Social History   Tobacco Use  . Smoking status: Former Smoker    Packs/day: 1.50    Years: 8.00    Pack years: 12.00  . Smokeless tobacco: Never Used  Substance Use Topics  . Alcohol use: No    Alcohol/week: 0.0 standard drinks  . Drug use: No    FAMILY HISTORY: Family History  Problem Relation Age of Onset  . Pneumonia Mother   . Asthma Mother   . Diabetes Mother   .  Hypertension Mother   . Depression Mother   . Anxiety disorder Mother   . Eating disorder Mother   . Obesity Mother   . Sudden death Mother   . Cancer Father        Brain tumor  . Asthma Father    ROS: Review of Systems  Gastrointestinal: Negative for nausea and vomiting.  Musculoskeletal:       Negative for muscle weakness.   PHYSICAL EXAM: Pt in no acute distress  RECENT LABS AND TESTS: BMET    Component Value Date/Time   NA 138 10/08/2018 1421   K 4.5 10/08/2018 1421   CL 99 10/08/2018 1421   CO2 25 10/08/2018 1421   GLUCOSE 131 (H) 10/08/2018 1421   GLUCOSE 130 (H) 11/06/2017 0222   BUN 21 10/08/2018 1421   CREATININE 0.76 10/08/2018 1421   CALCIUM 9.6 10/08/2018 1421   GFRNONAA 86 10/08/2018 1421   GFRAA 99 10/08/2018 1421   Lab Results  Component Value Date   HGBA1C 6.6 (H) 10/08/2018   HGBA1C 6.8 (H) 05/14/2018   Lab Results  Component Value Date   INSULIN 6.6 10/08/2018   INSULIN 9.8 05/14/2018   CBC    Component Value Date/Time   WBC 8.0 05/14/2018 0941   WBC 10.1 11/06/2017 0222   RBC 3.97 05/14/2018 0941   RBC 4.31 11/06/2017 0222   HGB 12.0 05/14/2018 0941   HCT 37.4 05/14/2018 0941   PLT 380 11/06/2017 0222   MCV 94 05/14/2018 0941   MCH 30.2 05/14/2018 0941   MCH 29.0 11/06/2017 0222   MCHC 32.1 05/14/2018 0941   MCHC 32.3 11/06/2017 0222   RDW 14.1 05/14/2018 0941   LYMPHSABS 1.9 05/14/2018 0941   MONOABS 0.6 11/06/2017 0222   EOSABS 0.2 05/14/2018 0941   BASOSABS 0.1 05/14/2018 0941   Iron/TIBC/Ferritin/ %Sat No results found for: IRON, TIBC, FERRITIN, IRONPCTSAT Lipid Panel     Component Value Date/Time   CHOL 188 10/08/2018 1421   TRIG 168 (H) 10/08/2018 1421   HDL 71 10/08/2018 1421   LDLCALC 83 10/08/2018 1421   Hepatic Function Panel     Component Value Date/Time   PROT 7.1 10/08/2018 1421   ALBUMIN 4.5 10/08/2018 1421   AST 11 10/08/2018 1421   ALT 20 10/08/2018 1421   ALKPHOS 74 10/08/2018 1421   BILITOT 0.4  10/08/2018 1421      Component Value Date/Time   TSH 5.150 (H) 05/14/2018 0941    Results for Salado, Monique C (MRN [id number]) as of 12/18/2018 14:39  Ref. Range 10/08/2018 14:21  Vitamin D, 25-Hydroxy Latest Ref Range: 30.0 - 100.0 ng/mL 30.2    I, Denise Haag, am acting as transcriptionist for Tracey Aguilar, PA-C I, Tracey Aguilar, PA-C have reviewed above note and agree with its content   

## 2018-12-27 ENCOUNTER — Encounter (INDEPENDENT_AMBULATORY_CARE_PROVIDER_SITE_OTHER): Payer: Self-pay | Admitting: Physician Assistant

## 2019-01-01 ENCOUNTER — Ambulatory Visit (INDEPENDENT_AMBULATORY_CARE_PROVIDER_SITE_OTHER): Payer: Medicare Other | Admitting: Physician Assistant

## 2019-01-02 ENCOUNTER — Other Ambulatory Visit: Payer: Self-pay

## 2019-01-02 ENCOUNTER — Ambulatory Visit (INDEPENDENT_AMBULATORY_CARE_PROVIDER_SITE_OTHER): Payer: Medicare Other | Admitting: Gynecology

## 2019-01-02 ENCOUNTER — Encounter: Payer: Self-pay | Admitting: Gynecology

## 2019-01-02 VITALS — BP 124/80 | Ht 64.0 in | Wt 275.0 lb

## 2019-01-02 DIAGNOSIS — Z01419 Encounter for gynecological examination (general) (routine) without abnormal findings: Secondary | ICD-10-CM

## 2019-01-02 DIAGNOSIS — N952 Postmenopausal atrophic vaginitis: Secondary | ICD-10-CM

## 2019-01-02 NOTE — Progress Notes (Signed)
    Sally [name] [birth year] [id number]        58 y.o.  [id number] for annual gynecologic exam.  Without gynecologic complaints  Past medical history,surgical history, problem list, medications, allergies, family history and social history were all reviewed and documented as reviewed in the EPIC chart.  ROS:  Performed with pertinent positives and negatives included in the history, assessment and plan.   Additional significant findings : None   Exam: [name] assistant Vitals:   01/02/19 1123  BP: 124/80  Weight: 275 lb (124.7 kg)  Height: 5\' 4"  (1.626 m)   Body mass index is 47.2 kg/m.  General appearance:  Normal affect, orientation and appearance. Skin: Grossly normal HEENT: Without gross lesions.  No cervical or supraclavicular adenopathy. Thyroid normal.  Lungs:  Clear without wheezing, rales or rhonchi Cardiac: RR, without RMG Abdominal:  Soft, nontender, without masses, guarding, rebound, organomegaly or hernia Breasts:  Examined lying and sitting without masses, retractions, discharge or axillary adenopathy. Pelvic:  Ext, BUS, Vagina: Normal with atrophic changes  Cervix: Normal with atrophic changes  Uterus: Difficult to palpate but no gross masses or tenderness  Adnexa: Without masses or tenderness    Anus and perineum: Normal   Rectovaginal: Normal sphincter tone without palpated masses or tenderness.    Assessment/Plan:  58 y.o. [id number] female for annual gynecologic exam.   1. Postmenopausal.  No significant menopausal symptoms or any vaginal bleeding. 2. Pap smear/HPV 2018.  No Pap smear done today.  No history of abnormal Pap smears.  Plan repeat Pap smear/HPV at 5-year interval per current screening guidelines. 3. Colonoscopy 2014.  Repeat at their recommended interval. 4. Mammography is scheduled later this month.  Breast exam normal today. 5. DEXA 2019 normal.  Plan repeat DEXA at 5-year interval. 6. Health maintenance.  No routine lab work done as  patient does this elsewhere.  Follow-up 1 year, sooner as needed.   Dara Lords MD, 12:05 PM 01/02/2019

## 2019-01-02 NOTE — Patient Instructions (Signed)
Follow-up in 1 year for annual exam, sooner as needed. 

## 2019-01-08 ENCOUNTER — Encounter (INDEPENDENT_AMBULATORY_CARE_PROVIDER_SITE_OTHER): Payer: Self-pay | Admitting: Physician Assistant

## 2019-01-09 NOTE — Telephone Encounter (Signed)
Please schedule

## 2019-01-14 ENCOUNTER — Encounter: Payer: Self-pay | Admitting: Gynecology

## 2019-01-20 ENCOUNTER — Telehealth (INDEPENDENT_AMBULATORY_CARE_PROVIDER_SITE_OTHER): Payer: Medicare Other | Admitting: Physician Assistant

## 2019-01-20 ENCOUNTER — Other Ambulatory Visit: Payer: Self-pay

## 2019-01-20 ENCOUNTER — Encounter (INDEPENDENT_AMBULATORY_CARE_PROVIDER_SITE_OTHER): Payer: Self-pay | Admitting: Physician Assistant

## 2019-01-20 DIAGNOSIS — E119 Type 2 diabetes mellitus without complications: Secondary | ICD-10-CM | POA: Diagnosis not present

## 2019-01-20 DIAGNOSIS — E559 Vitamin D deficiency, unspecified: Secondary | ICD-10-CM

## 2019-01-20 DIAGNOSIS — Z6841 Body Mass Index (BMI) 40.0 and over, adult: Secondary | ICD-10-CM | POA: Diagnosis not present

## 2019-01-22 NOTE — Progress Notes (Signed)
Office: [phone]  /  Fax: [phone] TeleHealth Visit:  Sally [name] has verbally consented to this TeleHealth visit today. The patient is located in her car, the provider is located at the [facility] office. The participants in this visit include the listed provider and patient. The visit was conducted today via FaceTime.  HPI:   Chief Complaint: OBESITY Sally [name] is here to discuss her progress with her obesity treatment plan. She is on the Category 3 plan and is following her eating plan approximately 95% of the time. She states she is swimming and walking 30 minutes 2-3 times per week. Tylah reports her last weight to be 268 lbs. She is struggling to eat on plan with lunch and dinner and reports undereating her protein.  We were unable to weigh the patient today for this TeleHealth visit. She feels as if she has lost weight since her last visit. She has lost 18 lbs since starting treatment with [name].  Diabetes II Sally [name] has a diagnosis of diabetes type II and is on metformin. Sally [name] states after meals blood sugars range 140-170. She denies any hypoglycemic episodes. Last A1c was 6.6 on 10/08/2018. She has been working on intensive lifestyle modifications including diet, exercise, and weight loss to help control her blood glucose levels. No nausea, vomiting, or diarrhea.  Vitamin D deficiency Sally [name] has a diagnosis of Vitamin D deficiency. She is currently taking prescription Vit D and denies nausea, vomiting or muscle weakness.  ASSESSMENT AND PLAN:  No diagnosis found.  PLAN:  Diabetes II Sally [name] has been given extensive diabetes education by myself today including ideal fasting and post-prandial blood glucose readings, individual ideal HgA1c goals  and hypoglycemia prevention. We discussed the importance of good blood sugar control to decrease the likelihood of diabetic complications such as nephropathy, neuropathy, limb loss, blindness, coronary artery disease,  and death. We discussed the importance of intensive lifestyle modification including diet, exercise and weight loss as the first line treatment for diabetes. Sally [name] agrees to continue her diabetes medications and will follow-up at the agreed upon time.  Vitamin D Deficiency Sally [name] was informed that low Vitamin D levels contributes to fatigue and are associated with obesity, breast, and colon cancer. She agrees to continue to take prescription Vit D @ 50,000 IU every three days #10 with 0 refills and will follow-up for routine testing of Vitamin D, at least 2-3 times per year. She was informed of the risk of over-replacement of Vitamin D and agrees to not increase her dose unless she discusses this with [name] first. Sally [name] agrees to follow-up with our clinic in 2 weeks.  Obesity Sally [name] is currently in the action stage of change. As such, her goal is to continue with weight loss efforts. She has agreed to follow the Category 3 plan. Sally [name] has been instructed to work up to a goal of 150 minutes of combined cardio and strengthening exercise per week for weight loss and overall health benefits. We discussed the following Behavioral Modification Strategies today: increasing lean protein intake and no skipping meals.  Sally [name] has agreed to follow-up with our clinic in 2 weeks. She was informed of the importance of frequent follow-up visits to maximize her success with intensive lifestyle modifications for her multiple health conditions.  ALLERGIES: No Known Allergies  MEDICATIONS: Current Outpatient Medications on File Prior to Visit  Medication Sig Dispense Refill   albuterol (PROVENTIL HFA;VENTOLIN HFA) 108 (90 BASE) MCG/ACT inhaler Inhale 2 puffs into the lungs every 6 (  six) hours as needed. FOR WHEEZING      blood glucose meter kit and supplies Dispense based on patient and insurance preference. Use up to four times daily as directed. (FOR ICD-10 E10.9, E11.9). Test twice daily 1 each 0    cyclobenzaprine (FLEXERIL) 10 MG tablet Take 10 mg by mouth 3 (three) times daily as needed for muscle spasms.     DULoxetine (CYMBALTA) 30 MG capsule Take 30 mg by mouth 3 (three) times daily.     Etanercept (ENBREL [place]) Inject into the skin.     furosemide (LASIX) 20 MG tablet Take 20 mg by mouth daily as needed.     gabapentin (NEURONTIN) 300 MG capsule Take 300 mg by mouth 2 (two) times daily.   0   meloxicam (MOBIC) 15 MG tablet Take 15 mg by mouth at bedtime.  3   metFORMIN (GLUCOPHAGE-XR) 500 MG 24 hr tablet Take 1 tablet (500 mg total) by mouth 2 (two) times daily. 180 tablet 0   methotrexate (RHEUMATREX) 2.5 MG tablet Take 2.5 mg by mouth once a week. Take 8 tablets by mouth once a week on Friday.  3   montelukast (SINGULAIR) 10 MG tablet Take 10 mg by mouth at bedtime.     Multiple Vitamins-Minerals (HAIR SKIN & NAILS ADVANCED PO) Take 2 tablets by mouth 2 (two) times daily.     mupirocin ointment (BACTROBAN) 2 % Place 1 application into the nose 2 (two) times daily as needed.     traMADol (ULTRAM) 50 MG tablet Take 50 mg by mouth 2 (two) times daily as needed for moderate pain.      Vitamin D, Ergocalciferol, (DRISDOL) 1.25 MG (50000 UT) CAPS capsule Take 1 capsule (50,000 Units total) by mouth every 3 (three) days. On Friday 30 capsule 0   predniSONE (DELTASONE) 10 MG tablet Take 10 mg by mouth daily with breakfast.     No current facility-administered medications on file prior to visit.     PAST MEDICAL HISTORY: Past Medical History:  Diagnosis Date   ADD (attention deficit disorder)    Anemia    Anxiety    Arthritis    Asthma    Back pain    CFS (chronic fatigue syndrome)    Colitis    Depression    Diabetes (HCC)    Dyspnea    Fibromyalgia    GERD (gastroesophageal reflux disease)    Hx of blood clots    IBS (irritable bowel syndrome)    Joint pain    Left knee pain    Leg edema    Osteoarthritis    Prediabetes    Pulmonary  embolism (HCC)    RA (rheumatoid arthritis) (HCC)    RA (rheumatoid arthritis) (HCC)    RLS (restless legs syndrome)    Sleep apnea    Vitamin B 12 deficiency    Vitamin D deficiency     PAST SURGICAL HISTORY: Past Surgical History:  Procedure Laterality Date   EYE SURGERY     MOUTH SURGERY      SOCIAL HISTORY: Social History   Tobacco Use   Smoking status: Former Smoker    Packs/day: 1.50    Years: 8.00    Pack years: 12.00   Smokeless tobacco: Never Used  Substance Use Topics   Alcohol use: No    Alcohol/week: 0.0 standard drinks   Drug use: No    FAMILY HISTORY: Family History  Problem Relation Age of Onset   Pneumonia Mother  Asthma Mother    Diabetes Mother    Hypertension Mother    Depression Mother    Anxiety disorder Mother    Eating disorder Mother    Obesity Mother    Sudden death Mother    Cancer Father        Brain tumor   Asthma Father    ROS: Review of Systems  Gastrointestinal: Negative for diarrhea, nausea and vomiting.  Musculoskeletal:       Negative for muscle weakness.  Endo/Heme/Allergies:       Negative for hypoglycemia.   PHYSICAL EXAM: Pt in no acute distress  RECENT LABS AND TESTS: BMET    Component Value Date/Time   NA 138 10/08/2018 1421   K 4.5 10/08/2018 1421   CL 99 10/08/2018 1421   CO2 25 10/08/2018 1421   GLUCOSE 131 (H) 10/08/2018 1421   GLUCOSE 130 (H) 11/06/2017 0222   BUN 21 10/08/2018 1421   CREATININE 0.76 10/08/2018 1421   CALCIUM 9.6 10/08/2018 1421   GFRNONAA 86 10/08/2018 1421   GFRAA 99 10/08/2018 1421   Lab Results  Component Value Date   HGBA1C 6.6 (H) 10/08/2018   HGBA1C 6.8 (H) 05/14/2018   Lab Results  Component Value Date   INSULIN 6.6 10/08/2018   INSULIN 9.8 05/14/2018   CBC    Component Value Date/Time   WBC 8.0 05/14/2018 0941   WBC 10.1 11/06/2017 0222   RBC 3.97 05/14/2018 0941   RBC 4.31 11/06/2017 0222   HGB 12.0 05/14/2018 0941   HCT 37.4  05/14/2018 0941   PLT 380 11/06/2017 0222   MCV 94 05/14/2018 0941   MCH 30.2 05/14/2018 0941   MCH 29.0 11/06/2017 0222   MCHC 32.1 05/14/2018 0941   MCHC 32.3 11/06/2017 0222   RDW 14.1 05/14/2018 0941   LYMPHSABS 1.9 05/14/2018 0941   MONOABS 0.6 11/06/2017 0222   EOSABS 0.2 05/14/2018 0941   BASOSABS 0.1 05/14/2018 0941   Iron/TIBC/Ferritin/ %Sat No results found for: IRON, TIBC, FERRITIN, IRONPCTSAT Lipid Panel     Component Value Date/Time   CHOL 188 10/08/2018 1421   TRIG 168 (H) 10/08/2018 1421   HDL 71 10/08/2018 1421   LDLCALC 83 10/08/2018 1421   Hepatic Function Panel     Component Value Date/Time   PROT 7.1 10/08/2018 1421   ALBUMIN 4.5 10/08/2018 1421   AST 11 10/08/2018 1421   ALT 20 10/08/2018 1421   ALKPHOS 74 10/08/2018 1421   BILITOT 0.4 10/08/2018 1421      Component Value Date/Time   TSH 5.150 (H) 05/14/2018 0941    Results for ANNI, HOCEVAR (MRN [id number]) as of 01/22/2019 15:44  Ref. Range 10/08/2018 14:21  Vitamin D, 25-Hydroxy Latest Ref Range: 30.0 - 100.0 ng/mL 30.2   I, Michaelene Song, am acting as Location manager for [employer], PA-C I, Abby Potash, PA-C have reviewed above note and agree with its content

## 2019-01-23 ENCOUNTER — Ambulatory Visit (INDEPENDENT_AMBULATORY_CARE_PROVIDER_SITE_OTHER): Payer: Medicare Other | Admitting: Physician Assistant

## 2019-01-23 MED ORDER — VITAMIN D (ERGOCALCIFEROL) 1.25 MG (50000 UNIT) PO CAPS: 50000.0000 [IU] | ORAL_CAPSULE | ORAL | 0 refills | Status: DC

## 2019-02-03 ENCOUNTER — Other Ambulatory Visit (INDEPENDENT_AMBULATORY_CARE_PROVIDER_SITE_OTHER): Payer: Self-pay | Admitting: Physician Assistant

## 2019-02-03 DIAGNOSIS — E119 Type 2 diabetes mellitus without complications: Secondary | ICD-10-CM

## 2019-02-06 ENCOUNTER — Other Ambulatory Visit: Payer: Self-pay

## 2019-02-06 ENCOUNTER — Ambulatory Visit (INDEPENDENT_AMBULATORY_CARE_PROVIDER_SITE_OTHER): Payer: Medicare Other | Admitting: Physician Assistant

## 2019-02-06 ENCOUNTER — Encounter (INDEPENDENT_AMBULATORY_CARE_PROVIDER_SITE_OTHER): Payer: Self-pay | Admitting: Physician Assistant

## 2019-02-06 VITALS — BP 144/82 | HR 98 | Temp 98.2°F | Ht 64.0 in | Wt 266.0 lb

## 2019-02-06 DIAGNOSIS — Z6841 Body Mass Index (BMI) 40.0 and over, adult: Secondary | ICD-10-CM

## 2019-02-06 DIAGNOSIS — E559 Vitamin D deficiency, unspecified: Secondary | ICD-10-CM

## 2019-02-06 DIAGNOSIS — E119 Type 2 diabetes mellitus without complications: Secondary | ICD-10-CM | POA: Diagnosis not present

## 2019-02-06 DIAGNOSIS — Z9189 Other specified personal risk factors, not elsewhere classified: Secondary | ICD-10-CM | POA: Diagnosis not present

## 2019-02-06 MED ORDER — VITAMIN D (ERGOCALCIFEROL) 1.25 MG (50000 UNIT) PO CAPS: 50000.0000 [IU] | ORAL_CAPSULE | ORAL | 0 refills | Status: DC

## 2019-02-10 NOTE — Progress Notes (Signed)
Office: [phone]  /  Fax: [phone]   HPI:   Chief Complaint: OBESITY Dyesha is here to discuss her progress with her obesity treatment plan. She is on the Category 3 plan and is following her eating plan approximately 90% of the time. She states she is moving around at work. Laia reports that she has been skipping lunch and undereating her protein at her other meals.  Her weight is 266 lb (120.7 kg) today and has had a weight gain of 3 lbs since her last in-office visit 4 months ago. She has lost 15 lbs since starting treatment with [name].  Vitamin D deficiency Carrieann has a diagnosis of Vitamin D deficiency. She is currently taking prescription Vit D and denies nausea, vomiting or muscle weakness.  At risk for osteopenia and osteoporosis Neviah is at higher risk of osteopenia and osteoporosis due to Vitamin D deficiency.   Diabetes II Madiline has a diagnosis of diabetes type II and is on metformin. Kahlyn states she hasn't checked her fasting blood sugars recently. Last A1c was 6.6 on 10/08/2018. She has been working on intensive lifestyle modifications including diet, exercise, and weight loss to help control her blood glucose levels.  ASSESSMENT AND PLAN:  Vitamin D deficiency - Plan: Vitamin D, Ergocalciferol, (DRISDOL) 1.25 MG (50000 UT) CAPS capsule  Type 2 diabetes mellitus without complication, without long-term current use of insulin (HCC)  At risk for osteoporosis  Class 3 severe obesity with serious comorbidity and body mass index (BMI) of 45.0 to 49.9 in adult, unspecified obesity type ([place])  PLAN:  Vitamin D Deficiency Maximina was informed that low Vitamin D levels contributes to fatigue and are associated with obesity, breast, and colon cancer. She agrees to continue to take prescription Vit D @ 50,000 IU every three days #10 with 0 refills and will follow-up for routine testing of Vitamin D, at least 2-3 times per year. She was informed of the risk of  over-replacement of Vitamin D and agrees to not increase her dose unless she discusses this with [name] first. Jazell agrees to follow-up with our clinic in 2 weeks.  At risk for osteopenia and osteoporosis Milanni was given extended  (15 minutes) osteoporosis prevention counseling today. Aritha is at risk for osteopenia and osteoporsis due to her Vitamin D deficiency. She was encouraged to take her Vitamin D and follow her higher calcium diet and increase strengthening exercise to help strengthen her bones and decrease her risk of osteopenia and osteoporosis.  Diabetes II Chandrika has been given extensive diabetes education by myself today including ideal fasting and post-prandial blood glucose readings, individual ideal HgA1c goals  and hypoglycemia prevention. We discussed the importance of good blood sugar control to decrease the likelihood of diabetic complications such as nephropathy, neuropathy, limb loss, blindness, coronary artery disease, and death. We discussed the importance of intensive lifestyle modification including diet, exercise and weight loss as the first line treatment for diabetes. Karmela agrees to continue her diabetes medications and will follow-up at the agreed upon time.  Obesity Aveyah is currently in the action stage of change. As such, her goal is to continue with weight loss efforts. She has agreed to follow the Category 3 plan. Bhavana has been instructed to work up to a goal of 150 minutes of combined cardio and strengthening exercise per week for weight loss and overall health benefits. We discussed the following Behavioral Modification Strategies today: increasing lean protein intake and no skipping meals.  Marcela has agreed to  follow-up with our clinic in 2 weeks. She was informed of the importance of frequent follow-up visits to maximize her success with intensive lifestyle modifications for her multiple health conditions.  ALLERGIES: No Known  Allergies  MEDICATIONS: Current Outpatient Medications on File Prior to Visit  Medication Sig Dispense Refill   albuterol (PROVENTIL HFA;VENTOLIN HFA) 108 (90 BASE) MCG/ACT inhaler Inhale 2 puffs into the lungs every 6 (six) hours as needed. FOR WHEEZING      blood glucose meter kit and supplies Dispense based on patient and insurance preference. Use up to four times daily as directed. (FOR ICD-10 E10.9, E11.9). Test twice daily 1 each 0   cyclobenzaprine (FLEXERIL) 10 MG tablet Take 10 mg by mouth 3 (three) times daily as needed for muscle spasms.     DULoxetine (CYMBALTA) 30 MG capsule Take 30 mg by mouth 3 (three) times daily.     Etanercept (ENBREL [place]) Inject into the skin.     furosemide (LASIX) 20 MG tablet Take 20 mg by mouth daily as needed.     gabapentin (NEURONTIN) 300 MG capsule Take 300 mg by mouth 2 (two) times daily.   0   meloxicam (MOBIC) 15 MG tablet Take 15 mg by mouth at bedtime.  3   metFORMIN (GLUCOPHAGE-XR) 500 MG 24 hr tablet Take 1 tablet (500 mg total) by mouth 2 (two) times daily. 180 tablet 0   methotrexate (RHEUMATREX) 2.5 MG tablet Take 2.5 mg by mouth once a week. Take 8 tablets by mouth once a week on Friday.  3   montelukast (SINGULAIR) 10 MG tablet Take 10 mg by mouth at bedtime.     Multiple Vitamins-Minerals (HAIR SKIN & NAILS ADVANCED PO) Take 2 tablets by mouth 2 (two) times daily.     mupirocin ointment (BACTROBAN) 2 % Place 1 application into the nose 2 (two) times daily as needed.     predniSONE (DELTASONE) 10 MG tablet Take 10 mg by mouth daily with breakfast.     traMADol (ULTRAM) 50 MG tablet Take 50 mg by mouth 2 (two) times daily as needed for moderate pain.      No current facility-administered medications on file prior to visit.     PAST MEDICAL HISTORY: Past Medical History:  Diagnosis Date   ADD (attention deficit disorder)    Anemia    Anxiety    Arthritis    Asthma    Back pain    CFS (chronic fatigue  syndrome)    Colitis    Depression    Diabetes (HCC)    Dyspnea    Fibromyalgia    GERD (gastroesophageal reflux disease)    Hx of blood clots    IBS (irritable bowel syndrome)    Joint pain    Left knee pain    Leg edema    Osteoarthritis    Prediabetes    Pulmonary embolism (HCC)    RA (rheumatoid arthritis) (HCC)    RA (rheumatoid arthritis) (HCC)    RLS (restless legs syndrome)    Sleep apnea    Vitamin B 12 deficiency    Vitamin D deficiency     PAST SURGICAL HISTORY: Past Surgical History:  Procedure Laterality Date   EYE SURGERY     MOUTH SURGERY      SOCIAL HISTORY: Social History   Tobacco Use   Smoking status: Former Smoker    Packs/day: 1.50    Years: 8.00    Pack years: 12.00   Smokeless tobacco: Never  Used  Substance Use Topics   Alcohol use: No    Alcohol/week: 0.0 standard drinks   Drug use: No    FAMILY HISTORY: Family History  Problem Relation Age of Onset   Pneumonia Mother    Asthma Mother    Diabetes Mother    Hypertension Mother    Depression Mother    Anxiety disorder Mother    Eating disorder Mother    Obesity Mother    Sudden death Mother    Cancer Father        Brain tumor   Asthma Father    ROS: Review of Systems  Gastrointestinal: Negative for diarrhea, nausea and vomiting.  Musculoskeletal:       Negative for muscle weakness.   PHYSICAL EXAM: Blood pressure (!) 144/82, pulse 98, temperature 98.2 F (36.8 C), temperature source Oral, height 5' 4"  (1.626 m), weight 266 lb (120.7 kg), last menstrual period 05/31/2012, SpO2 95 %. Body mass index is 45.66 kg/m. Physical Exam Vitals signs reviewed.  Constitutional:      Appearance: Normal appearance. She is obese.  Cardiovascular:     Rate and Rhythm: Normal rate.     Pulses: Normal pulses.  Pulmonary:     Effort: Pulmonary effort is normal.     Breath sounds: Normal breath sounds.  Musculoskeletal: Normal range of motion.   Skin:    General: Skin is warm and dry.  Neurological:     Mental Status: She is alert and oriented to person, place, and time.  Psychiatric:        Behavior: Behavior normal.   RECENT LABS AND TESTS: BMET    Component Value Date/Time   NA 138 10/08/2018 1421   K 4.5 10/08/2018 1421   CL 99 10/08/2018 1421   CO2 25 10/08/2018 1421   GLUCOSE 131 (H) 10/08/2018 1421   GLUCOSE 130 (H) 11/06/2017 0222   BUN 21 10/08/2018 1421   CREATININE 0.76 10/08/2018 1421   CALCIUM 9.6 10/08/2018 1421   GFRNONAA 86 10/08/2018 1421   GFRAA 99 10/08/2018 1421   Lab Results  Component Value Date   HGBA1C 6.6 (H) 10/08/2018   HGBA1C 6.8 (H) 05/14/2018   Lab Results  Component Value Date   INSULIN 6.6 10/08/2018   INSULIN 9.8 05/14/2018   CBC    Component Value Date/Time   WBC 8.0 05/14/2018 0941   WBC 10.1 11/06/2017 0222   RBC 3.97 05/14/2018 0941   RBC 4.31 11/06/2017 0222   HGB 12.0 05/14/2018 0941   HCT 37.4 05/14/2018 0941   PLT 380 11/06/2017 0222   MCV 94 05/14/2018 0941   MCH 30.2 05/14/2018 0941   MCH 29.0 11/06/2017 0222   MCHC 32.1 05/14/2018 0941   MCHC 32.3 11/06/2017 0222   RDW 14.1 05/14/2018 0941   LYMPHSABS 1.9 05/14/2018 0941   MONOABS 0.6 11/06/2017 0222   EOSABS 0.2 05/14/2018 0941   BASOSABS 0.1 05/14/2018 0941   Iron/TIBC/Ferritin/ %Sat No results found for: IRON, TIBC, FERRITIN, IRONPCTSAT Lipid Panel     Component Value Date/Time   CHOL 188 10/08/2018 1421   TRIG 168 (H) 10/08/2018 1421   HDL 71 10/08/2018 1421   LDLCALC 83 10/08/2018 1421   Hepatic Function Panel     Component Value Date/Time   PROT 7.1 10/08/2018 1421   ALBUMIN 4.5 10/08/2018 1421   AST 11 10/08/2018 1421   ALT 20 10/08/2018 1421   ALKPHOS 74 10/08/2018 1421   BILITOT 0.4 10/08/2018 1421  Component Value Date/Time   TSH 5.150 (H) 05/14/2018 0941   Results for SHERRELLE, PROCHAZKA (MRN [id number]) as of 02/10/2019 08:27  Ref. Range 10/08/2018 14:21  Vitamin D,  25-Hydroxy Latest Ref Range: 30.0 - 100.0 ng/mL 30.2   OBESITY BEHAVIORAL INTERVENTION VISIT  Today's visit was #16  Starting weight: 281 lbs Starting date: 05/14/2018 Today's weight: 266 lbs  Today's date: 02/06/2019 Total lbs lost to date: 15    02/06/2019  Height 5' 4"  (1.626 m)  Weight 266 lb (120.7 kg)  BMI (Calculated) 45.64  BLOOD PRESSURE - SYSTOLIC 121  BLOOD PRESSURE - DIASTOLIC 82   Body Fat % 62.4 %   ASK: We discussed the diagnosis of obesity with Alyson Ingles today and Saoirse agreed to give [name] permission to discuss obesity behavioral modification therapy today.  ASSESS: Ailany has the diagnosis of obesity and her BMI today is 45.8. Tanayia is in the action stage of change.   ADVISE: Varie was educated on the multiple health risks of obesity as well as the benefit of weight loss to improve her health. She was advised of the need for long term treatment and the importance of lifestyle modifications to improve her current health and to decrease her risk of future health problems.  AGREE: Multiple dietary modification options and treatment options were discussed and  Dinesha agreed to follow the recommendations documented in the above note.  ARRANGE: Robbin was educated on the importance of frequent visits to treat obesity as outlined per CMS and USPSTF guidelines and agreed to schedule her next follow up appointment today.  Migdalia Dk, am acting as transcriptionist for Abby Potash, PA-C I, Abby Potash, PA-C have reviewed above note and agree with its content

## 2019-02-25 ENCOUNTER — Ambulatory Visit (INDEPENDENT_AMBULATORY_CARE_PROVIDER_SITE_OTHER): Payer: Medicare Other | Admitting: Physician Assistant

## 2019-02-27 ENCOUNTER — Other Ambulatory Visit: Payer: Self-pay

## 2019-02-27 ENCOUNTER — Ambulatory Visit (INDEPENDENT_AMBULATORY_CARE_PROVIDER_SITE_OTHER): Payer: Medicare Other | Admitting: Bariatrics

## 2019-02-27 ENCOUNTER — Encounter (INDEPENDENT_AMBULATORY_CARE_PROVIDER_SITE_OTHER): Payer: Self-pay | Admitting: Bariatrics

## 2019-02-27 DIAGNOSIS — Z6841 Body Mass Index (BMI) 40.0 and over, adult: Secondary | ICD-10-CM

## 2019-02-27 DIAGNOSIS — E119 Type 2 diabetes mellitus without complications: Secondary | ICD-10-CM | POA: Diagnosis not present

## 2019-02-27 DIAGNOSIS — E559 Vitamin D deficiency, unspecified: Secondary | ICD-10-CM | POA: Diagnosis not present

## 2019-03-03 ENCOUNTER — Encounter (INDEPENDENT_AMBULATORY_CARE_PROVIDER_SITE_OTHER): Payer: Self-pay | Admitting: Bariatrics

## 2019-03-03 NOTE — Progress Notes (Signed)
Office: [phone]  /  Fax: [phone] TeleHealth Visit:  Sally [name] has verbally consented to this TeleHealth visit today. The patient is located at home, the provider is located at the [facility] office. The participants in this visit include the listed provider and patient and any and all parties involved. The visit was conducted today via FaceTime.  HPI:   Chief Complaint: OBESITY Sally [name] is here to discuss her progress with her obesity treatment plan. She is on the Category 3 plan and is following her eating plan approximately 93 % of the time. She states she is walking 20 minutes 7 times per week. Sally [name] states that she has lost 2 to 3 pounds (weight 263 lbs). She is doing better. Sally [name] normally sees Sally [name], [place]. She is doing better with water. We were unable to weigh the patient today for this TeleHealth visit. She feels as if she has lost weight since her last visit. She has lost 15 lbs since starting treatment with [name].  Vitamin D deficiency Sally [name] has a diagnosis of vitamin D deficiency. Her last vitamin D level was at 30.2 She is currently taking vit D and denies nausea, vomiting or muscle weakness.  Diabetes II Sally [name] has a diagnosis of diabetes type II. She is taking Metformin. Sally [name] is taking metformin and she denies any hypoglycemic episodes. Last A1c was at 6.6 She has been working on intensive lifestyle modifications including diet, exercise, and weight loss to help control her blood glucose levels.  ASSESSMENT AND PLAN:  Vitamin D deficiency  Type 2 diabetes mellitus without complication, without long-term current use of insulin (HCC)  Class 3 severe obesity with serious comorbidity and body mass index (BMI) of 40.0 to 44.9 in adult, unspecified obesity type ([place])  PLAN:  Vitamin D Deficiency Sally [name] was informed that low vitamin D levels contributes to fatigue and are associated with obesity, breast, and colon cancer. She will  continue to take prescription Vit D @50 ,000 IU every three days and will follow up for routine testing of vitamin D, at least 2-3 times per year. She was informed of the risk of over-replacement of vitamin D and agrees to not increase her dose unless she discusses this with [name] first.  Diabetes II Sally [name] has been given extensive diabetes education by myself today including ideal fasting and post-prandial blood glucose readings, individual ideal Hgb A1c goals and hypoglycemia prevention. We discussed the importance of good blood sugar control to decrease the likelihood of diabetic complications such as nephropathy, neuropathy, limb loss, blindness, coronary artery disease, and death. We discussed the importance of intensive lifestyle modification including diet, exercise and weight loss as the first line treatment for diabetes. Sally [name] will continue her metformin and follow up at the agreed upon time.  Obesity Sally [name] is currently in the action stage of change. As such, her goal is to continue with weight loss efforts She has agreed to follow the Category 3 plan Sally [name] will continue walking her dogs for weight loss and overall health benefits. We discussed the following Behavioral Modification Strategies today: increase H2O intake, no skipping meals, increasing lean protein intake, decreasing simple carbohydrates, increasing vegetables, decrease eating out and work on meal planning and intentional eating Sally [name] will adhere to the diet more and she will journal.  Sally [name] has agreed to follow up with our clinic in 2 weeks. She was informed of the importance of frequent follow up visits to maximize her success with intensive lifestyle modifications for her multiple  health conditions.  ALLERGIES: No Known Allergies  MEDICATIONS: Current Outpatient Medications on File Prior to Visit  Medication Sig Dispense Refill  . albuterol (PROVENTIL HFA;VENTOLIN HFA) 108 (90 BASE) MCG/ACT inhaler Inhale 2 puffs into  the lungs every 6 (six) hours as needed. FOR WHEEZING     . blood glucose meter kit and supplies Dispense based on patient and insurance preference. Use up to four times daily as directed. (FOR ICD-10 E10.9, E11.9). Test twice daily 1 each 0  . cyclobenzaprine (FLEXERIL) 10 MG tablet Take 10 mg by mouth 3 (three) times daily as needed for muscle spasms.    . DULoxetine (CYMBALTA) 30 MG capsule Take 30 mg by mouth 3 (three) times daily.    . Etanercept (ENBREL [place]) Inject into the skin.    . furosemide (LASIX) 20 MG tablet Take 20 mg by mouth daily as needed.    . gabapentin (NEURONTIN) 300 MG capsule Take 300 mg by mouth 2 (two) times daily.   0  . meloxicam (MOBIC) 15 MG tablet Take 15 mg by mouth at bedtime.  3  . metFORMIN (GLUCOPHAGE-XR) 500 MG 24 hr tablet Take 1 tablet (500 mg total) by mouth 2 (two) times daily. 180 tablet 0  . methotrexate (RHEUMATREX) 2.5 MG tablet Take 2.5 mg by mouth once a week. Take 8 tablets by mouth once a week on Friday.  3  . montelukast (SINGULAIR) 10 MG tablet Take 10 mg by mouth at bedtime.    . Multiple Vitamins-Minerals (HAIR SKIN & NAILS ADVANCED PO) Take 2 tablets by mouth 2 (two) times daily.    . mupirocin ointment (BACTROBAN) 2 % Place 1 application into the nose 2 (two) times daily as needed.    . predniSONE (DELTASONE) 10 MG tablet Take 10 mg by mouth daily with breakfast.    . traMADol (ULTRAM) 50 MG tablet Take 50 mg by mouth 2 (two) times daily as needed for moderate pain.     . Vitamin D, Ergocalciferol, (DRISDOL) 1.25 MG (50000 UT) CAPS capsule Take 1 capsule (50,000 Units total) by mouth every 3 (three) days. On Friday 10 capsule 0   No current facility-administered medications on file prior to visit.     PAST MEDICAL HISTORY: Past Medical History:  Diagnosis Date  . ADD (attention deficit disorder)   . Anemia   . Anxiety   . Arthritis   . Asthma   . Back pain   . CFS (chronic fatigue syndrome)   . Colitis   . Depression   . Diabetes  ([place])   . Dyspnea   . Fibromyalgia   . GERD (gastroesophageal reflux disease)   . Hx of blood clots   . IBS (irritable bowel syndrome)   . Joint pain   . Left knee pain   . Leg edema   . Osteoarthritis   . Prediabetes   . Pulmonary embolism ([place])   . RA (rheumatoid arthritis) ([place])   . RA (rheumatoid arthritis) ([place])   . RLS (restless legs syndrome)   . Sleep apnea   . Vitamin B 12 deficiency   . Vitamin D deficiency     PAST SURGICAL HISTORY: Past Surgical History:  Procedure Laterality Date  . EYE SURGERY    . MOUTH SURGERY      SOCIAL HISTORY: Social History   Tobacco Use  . Smoking status: Former Smoker    Packs/day: 1.50    Years: 8.00    Pack years: 12.00  . Smokeless tobacco:  Never Used  Substance Use Topics  . Alcohol use: No    Alcohol/week: 0.0 standard drinks  . Drug use: No    FAMILY HISTORY: Family History  Problem Relation Age of Onset  . Pneumonia Mother   . Asthma Mother   . Diabetes Mother   . Hypertension Mother   . Depression Mother   . Anxiety disorder Mother   . Eating disorder Mother   . Obesity Mother   . Sudden death Mother   . Cancer Father        Brain tumor  . Asthma Father     ROS: Review of Systems  Constitutional: Positive for weight loss.  Gastrointestinal: Negative for nausea and vomiting.  Musculoskeletal:       Negative for muscle weakness  Endo/Heme/Allergies:       Negative for hypoglycemia    PHYSICAL EXAM: Pt in no acute distress  RECENT LABS AND TESTS: BMET    Component Value Date/Time   NA 138 10/08/2018 1421   K 4.5 10/08/2018 1421   CL 99 10/08/2018 1421   CO2 25 10/08/2018 1421   GLUCOSE 131 (H) 10/08/2018 1421   GLUCOSE 130 (H) 11/06/2017 0222   BUN 21 10/08/2018 1421   CREATININE 0.76 10/08/2018 1421   CALCIUM 9.6 10/08/2018 1421   GFRNONAA 86 10/08/2018 1421   GFRAA 99 10/08/2018 1421   Lab Results  Component Value Date   HGBA1C 6.6 (H) 10/08/2018   HGBA1C 6.8 (H) 05/14/2018    Lab Results  Component Value Date   INSULIN 6.6 10/08/2018   INSULIN 9.8 05/14/2018   CBC    Component Value Date/Time   WBC 8.0 05/14/2018 0941   WBC 10.1 11/06/2017 0222   RBC 3.97 05/14/2018 0941   RBC 4.31 11/06/2017 0222   HGB 12.0 05/14/2018 0941   HCT 37.4 05/14/2018 0941   PLT 380 11/06/2017 0222   MCV 94 05/14/2018 0941   MCH 30.2 05/14/2018 0941   MCH 29.0 11/06/2017 0222   MCHC 32.1 05/14/2018 0941   MCHC 32.3 11/06/2017 0222   RDW 14.1 05/14/2018 0941   LYMPHSABS 1.9 05/14/2018 0941   MONOABS 0.6 11/06/2017 0222   EOSABS 0.2 05/14/2018 0941   BASOSABS 0.1 05/14/2018 0941   Iron/TIBC/Ferritin/ %Sat No results found for: IRON, TIBC, FERRITIN, IRONPCTSAT Lipid Panel     Component Value Date/Time   CHOL 188 10/08/2018 1421   TRIG 168 (H) 10/08/2018 1421   HDL 71 10/08/2018 1421   LDLCALC 83 10/08/2018 1421   Hepatic Function Panel     Component Value Date/Time   PROT 7.1 10/08/2018 1421   ALBUMIN 4.5 10/08/2018 1421   AST 11 10/08/2018 1421   ALT 20 10/08/2018 1421   ALKPHOS 74 10/08/2018 1421   BILITOT 0.4 10/08/2018 1421      Component Value Date/Time   TSH 5.150 (H) 05/14/2018 0941     Ref. Range 10/08/2018 14:21  Vitamin D, 25-Hydroxy Latest Ref Range: 30.0 - 100.0 ng/mL 30.2    I, Doreene Nest, am acting as Location manager for [employer]. Owens Shark, DO  I have reviewed the above documentation for accuracy and completeness, and I agree with the above. -Jearld Lesch, DO  I have reviewed the above documentation for accuracy and completeness, and I agree with the above. -Jearld Lesch, DO

## 2019-03-08 ENCOUNTER — Other Ambulatory Visit (INDEPENDENT_AMBULATORY_CARE_PROVIDER_SITE_OTHER): Payer: Self-pay | Admitting: Physician Assistant

## 2019-03-08 DIAGNOSIS — E119 Type 2 diabetes mellitus without complications: Secondary | ICD-10-CM

## 2019-03-17 ENCOUNTER — Telehealth (INDEPENDENT_AMBULATORY_CARE_PROVIDER_SITE_OTHER): Payer: Medicare Other | Admitting: Physician Assistant

## 2019-03-17 ENCOUNTER — Encounter (INDEPENDENT_AMBULATORY_CARE_PROVIDER_SITE_OTHER): Payer: Self-pay | Admitting: Physician Assistant

## 2019-03-17 ENCOUNTER — Other Ambulatory Visit: Payer: Self-pay

## 2019-03-17 DIAGNOSIS — E559 Vitamin D deficiency, unspecified: Secondary | ICD-10-CM | POA: Diagnosis not present

## 2019-03-17 DIAGNOSIS — E119 Type 2 diabetes mellitus without complications: Secondary | ICD-10-CM | POA: Diagnosis not present

## 2019-03-17 DIAGNOSIS — Z6841 Body Mass Index (BMI) 40.0 and over, adult: Secondary | ICD-10-CM | POA: Diagnosis not present

## 2019-03-17 MED ORDER — VITAMIN D (ERGOCALCIFEROL) 1.25 MG (50000 UNIT) PO CAPS: 50000.0000 [IU] | ORAL_CAPSULE | ORAL | 0 refills | Status: DC

## 2019-03-18 NOTE — Progress Notes (Signed)
Office: [phone]  /  Fax: [phone] TeleHealth Visit:  Sally [name] has verbally consented to this TeleHealth visit today. The patient is located at work, the provider is located at the [employer] [facility] office. The participants in this visit include the listed provider and patient. The visit was conducted today via FaceTime.  HPI:   Chief Complaint: OBESITY Sally [name] is here to discuss her progress with her obesity treatment plan. She is on the Category 3 plan and is following her eating plan approximately 80% of the time. She states she is walking/swimming 60 minutes 2-3 times per week. Sally [name] reports that she has done better getting in protein at dinner. Her daughter-in-law is in town and has been cooking. She has been skipping lunch some days.  We were unable to weigh the patient today for this TeleHealth visit. She feels as if she has gained 2 lbs since her last visit. She has lost 15 lbs since starting treatment with [name].  Vitamin D deficiency Sally [name] has a diagnosis of Vitamin D deficiency. She is currently taking prescription Vit D and denies nausea, vomiting or muscle weakness.  Diabetes II Sally [name] has a diagnosis of diabetes type II and is on metformin. Sally [name] does not report checking her blood sugars. Last A1c was 6.6 on 10/08/2018. She has been working on intensive lifestyle modifications including diet, exercise, and weight loss to help control her blood glucose levels. No nausea, vomiting, diarrhea, polyphagia, or hypoglycemia.  ASSESSMENT AND PLAN:  Type 2 diabetes mellitus without complication, without long-term current use of insulin (HCC)  Vitamin D deficiency - Plan: Vitamin D, Ergocalciferol, (DRISDOL) 1.25 MG (50000 UT) CAPS capsule  Class 3 severe obesity with serious comorbidity and body mass index (BMI) of 40.0 to 44.9 in adult, unspecified obesity type ([place])  PLAN:  Vitamin D Deficiency Sally [name] was informed that low Vitamin D levels contributes to  fatigue and are associated with obesity, breast, and colon cancer. She agrees to continue to take prescription Vit D @ 50,000 IU every three days #10 with 0 refills and will follow-up for routine testing of Vitamin D, at least 2-3 times per year. She was informed of the risk of over-replacement of Vitamin D and agrees to not increase her dose unless she discusses this with [name] first. Sally [name] agrees to follow-up with our clinic in 2 weeks.  Diabetes II Sally [name] has been given extensive diabetes education by myself today including ideal fasting and post-prandial blood glucose readings, individual ideal HgA1c goals  and hypoglycemia prevention. We discussed the importance of good blood sugar control to decrease the likelihood of diabetic complications such as nephropathy, neuropathy, limb loss, blindness, coronary artery disease, and death. We discussed the importance of intensive lifestyle modification including diet, exercise and weight loss as the first line treatment for diabetes. Sally [name] agrees to continue her diabetes medications and will follow-up at the agreed upon time.  Obesity Sally [name] is currently in the action stage of change. As such, her goal is to continue with weight loss efforts. She has agreed to follow the Category 3 plan. Sally [name] has been instructed to work up to a goal of 150 minutes of combined cardio and strengthening exercise per week for weight loss and overall health benefits. We discussed the following Behavioral Modification Strategies today: work on meal planning and easy cooking plans, and keeping healthy foods in the home.  Sally [name] has agreed to follow-up with our clinic in 2 weeks. She was informed of the importance of frequent  follow-up visits to maximize her success with intensive lifestyle modifications for her multiple health conditions.  ALLERGIES: No Known Allergies  MEDICATIONS: Current Outpatient Medications on File Prior to Visit  Medication Sig Dispense Refill    albuterol (PROVENTIL HFA;VENTOLIN HFA) 108 (90 BASE) MCG/ACT inhaler Inhale 2 puffs into the lungs every 6 (six) hours as needed. FOR WHEEZING      blood glucose meter kit and supplies Dispense based on patient and insurance preference. Use up to four times daily as directed. (FOR ICD-10 E10.9, E11.9). Test twice daily 1 each 0   cyclobenzaprine (FLEXERIL) 10 MG tablet Take 10 mg by mouth 3 (three) times daily as needed for muscle spasms.     DULoxetine (CYMBALTA) 30 MG capsule Take 30 mg by mouth 3 (three) times daily.     Etanercept (ENBREL [place]) Inject into the skin.     furosemide (LASIX) 20 MG tablet Take 20 mg by mouth daily as needed.     gabapentin (NEURONTIN) 300 MG capsule Take 300 mg by mouth 2 (two) times daily.   0   meloxicam (MOBIC) 15 MG tablet Take 15 mg by mouth at bedtime.  3   metFORMIN (GLUCOPHAGE-XR) 500 MG 24 hr tablet Take 1 tablet (500 mg total) by mouth 2 (two) times daily. 180 tablet 0   methotrexate (RHEUMATREX) 2.5 MG tablet Take 2.5 mg by mouth once a week. Take 8 tablets by mouth once a week on Friday.  3   montelukast (SINGULAIR) 10 MG tablet Take 10 mg by mouth at bedtime.     Multiple Vitamins-Minerals (HAIR SKIN & NAILS ADVANCED PO) Take 2 tablets by mouth 2 (two) times daily.     mupirocin ointment (BACTROBAN) 2 % Place 1 application into the nose 2 (two) times daily as needed.     predniSONE (DELTASONE) 10 MG tablet Take 10 mg by mouth daily with breakfast.     traMADol (ULTRAM) 50 MG tablet Take 50 mg by mouth 2 (two) times daily as needed for moderate pain.      No current facility-administered medications on file prior to visit.     PAST MEDICAL HISTORY: Past Medical History:  Diagnosis Date   ADD (attention deficit disorder)    Anemia    Anxiety    Arthritis    Asthma    Back pain    CFS (chronic fatigue syndrome)    Colitis    Depression    Diabetes (HCC)    Dyspnea    Fibromyalgia    GERD (gastroesophageal reflux  disease)    Hx of blood clots    IBS (irritable bowel syndrome)    Joint pain    Left knee pain    Leg edema    Osteoarthritis    Prediabetes    Pulmonary embolism (HCC)    RA (rheumatoid arthritis) (HCC)    RA (rheumatoid arthritis) (HCC)    RLS (restless legs syndrome)    Sleep apnea    Vitamin B 12 deficiency    Vitamin D deficiency     PAST SURGICAL HISTORY: Past Surgical History:  Procedure Laterality Date   EYE SURGERY     MOUTH SURGERY      SOCIAL HISTORY: Social History   Tobacco Use   Smoking status: Former Smoker    Packs/day: 1.50    Years: 8.00    Pack years: 12.00   Smokeless tobacco: Never Used  Substance Use Topics   Alcohol use: No    Alcohol/week: 0.0  standard drinks   Drug use: No    FAMILY HISTORY: Family History  Problem Relation Age of Onset   Pneumonia Mother    Asthma Mother    Diabetes Mother    Hypertension Mother    Depression Mother    Anxiety disorder Mother    Eating disorder Mother    Obesity Mother    Sudden death Mother    Cancer Father        Brain tumor   Asthma Father    ROS: Review of Systems  Gastrointestinal: Negative for diarrhea, nausea and vomiting.  Musculoskeletal:       Negative for muscle weakness.  Endo/Heme/Allergies:       Negative for polyphagia. Negative for hypoglycemia.   PHYSICAL EXAM: Pt in no acute distress  RECENT LABS AND TESTS: BMET    Component Value Date/Time   NA 138 10/08/2018 1421   K 4.5 10/08/2018 1421   CL 99 10/08/2018 1421   CO2 25 10/08/2018 1421   GLUCOSE 131 (H) 10/08/2018 1421   GLUCOSE 130 (H) 11/06/2017 0222   BUN 21 10/08/2018 1421   CREATININE 0.76 10/08/2018 1421   CALCIUM 9.6 10/08/2018 1421   GFRNONAA 86 10/08/2018 1421   GFRAA 99 10/08/2018 1421   Lab Results  Component Value Date   HGBA1C 6.6 (H) 10/08/2018   HGBA1C 6.8 (H) 05/14/2018   Lab Results  Component Value Date   INSULIN 6.6 10/08/2018   INSULIN 9.8  05/14/2018   CBC    Component Value Date/Time   WBC 8.0 05/14/2018 0941   WBC 10.1 11/06/2017 0222   RBC 3.97 05/14/2018 0941   RBC 4.31 11/06/2017 0222   HGB 12.0 05/14/2018 0941   HCT 37.4 05/14/2018 0941   PLT 380 11/06/2017 0222   MCV 94 05/14/2018 0941   MCH 30.2 05/14/2018 0941   MCH 29.0 11/06/2017 0222   MCHC 32.1 05/14/2018 0941   MCHC 32.3 11/06/2017 0222   RDW 14.1 05/14/2018 0941   LYMPHSABS 1.9 05/14/2018 0941   MONOABS 0.6 11/06/2017 0222   EOSABS 0.2 05/14/2018 0941   BASOSABS 0.1 05/14/2018 0941   Iron/TIBC/Ferritin/ %Sat No results found for: IRON, TIBC, FERRITIN, IRONPCTSAT Lipid Panel     Component Value Date/Time   CHOL 188 10/08/2018 1421   TRIG 168 (H) 10/08/2018 1421   HDL 71 10/08/2018 1421   LDLCALC 83 10/08/2018 1421   Hepatic Function Panel     Component Value Date/Time   PROT 7.1 10/08/2018 1421   ALBUMIN 4.5 10/08/2018 1421   AST 11 10/08/2018 1421   ALT 20 10/08/2018 1421   ALKPHOS 74 10/08/2018 1421   BILITOT 0.4 10/08/2018 1421      Component Value Date/Time   TSH 5.150 (H) 05/14/2018 0941   Results for SAKIYAH, SHUR (MRN [id number]) as of 03/18/2019 15:23  Ref. Range 10/08/2018 14:21  Vitamin D, 25-Hydroxy Latest Ref Range: 30.0 - 100.0 ng/mL 30.2   I, Michaelene Song, am acting as Location manager for [employer], PA-C I, Abby Potash, PA-C have reviewed above note and agree with its content

## 2019-04-02 ENCOUNTER — Ambulatory Visit (INDEPENDENT_AMBULATORY_CARE_PROVIDER_SITE_OTHER): Payer: Medicare Other | Admitting: Physician Assistant

## 2019-04-02 ENCOUNTER — Encounter (INDEPENDENT_AMBULATORY_CARE_PROVIDER_SITE_OTHER): Payer: Self-pay | Admitting: Physician Assistant

## 2019-04-02 ENCOUNTER — Other Ambulatory Visit: Payer: Self-pay

## 2019-04-02 VITALS — BP 121/74 | HR 89 | Temp 97.8°F | Ht 64.0 in | Wt 265.0 lb

## 2019-04-02 DIAGNOSIS — E559 Vitamin D deficiency, unspecified: Secondary | ICD-10-CM | POA: Diagnosis not present

## 2019-04-02 DIAGNOSIS — Z6841 Body Mass Index (BMI) 40.0 and over, adult: Secondary | ICD-10-CM

## 2019-04-03 NOTE — Progress Notes (Signed)
Office: [phone]  /  Fax: [phone]   HPI:   Chief Complaint: OBESITY Shevelle is here to discuss her progress with her obesity treatment plan. She is on the Category 3 plan and is following her eating plan approximately 85 to 90 % of the time. She states she is swimming and walking 30 minutes 3 times per week. Novia reports that she is going to the beach this weekend. She is drinking smoothies for breakfast. Her weight is 265 lb (120.2 kg) today and has had a weight loss of 1 pound since her last in-office visit. She has lost 16 lbs since starting treatment with [name].  Vitamin D deficiency Miyu has a diagnosis of vitamin D deficiency. She is currently taking vit D and denies nausea, vomiting or muscle weakness.  ASSESSMENT AND PLAN:  Vitamin D deficiency  Class 3 severe obesity with serious comorbidity and body mass index (BMI) of 45.0 to 49.9 in adult, unspecified obesity type ([place])  PLAN:  Vitamin D Deficiency Kimla was informed that low vitamin D levels contributes to fatigue and are associated with obesity, breast, and colon cancer. She will continue to take prescription Vit D @50 ,000 IU every 3 days and will follow up for routine testing of vitamin D, at least 2-3 times per year. She was informed of the risk of over-replacement of vitamin D and agrees to not increase her dose unless she discusses this with [name] first.  Obesity Deshauna is currently in the action stage of change. As such, her goal is to continue with weight loss efforts She has agreed to follow the Category 3 plan Hibo has been instructed to work up to a goal of 150 minutes of combined cardio and strengthening exercise per week for weight loss and overall health benefits. We discussed the following Behavioral Modification Strategies today: keeping healthy foods in the home and work on meal planning and easy cooking plans  Yuki has agreed to follow up with our clinic in 2 weeks. She was informed of the  importance of frequent follow up visits to maximize her success with intensive lifestyle modifications for her multiple health conditions.  ALLERGIES: No Known Allergies  MEDICATIONS: Current Outpatient Medications on File Prior to Visit  Medication Sig Dispense Refill   albuterol (PROVENTIL HFA;VENTOLIN HFA) 108 (90 BASE) MCG/ACT inhaler Inhale 2 puffs into the lungs every 6 (six) hours as needed. FOR WHEEZING      blood glucose meter kit and supplies Dispense based on patient and insurance preference. Use up to four times daily as directed. (FOR ICD-10 E10.9, E11.9). Test twice daily 1 each 0   cyclobenzaprine (FLEXERIL) 10 MG tablet Take 10 mg by mouth 3 (three) times daily as needed for muscle spasms.     DULoxetine (CYMBALTA) 30 MG capsule Take 30 mg by mouth 3 (three) times daily.     Etanercept (ENBREL [place]) Inject into the skin.     furosemide (LASIX) 20 MG tablet Take 20 mg by mouth daily as needed.     gabapentin (NEURONTIN) 300 MG capsule Take 300 mg by mouth 2 (two) times daily.   0   meloxicam (MOBIC) 15 MG tablet Take 15 mg by mouth at bedtime.  3   metFORMIN (GLUCOPHAGE-XR) 500 MG 24 hr tablet Take 1 tablet (500 mg total) by mouth 2 (two) times daily. 180 tablet 0   methotrexate (RHEUMATREX) 2.5 MG tablet Take 2.5 mg by mouth once a week. Take 8 tablets by mouth once a week on  Friday.  3   montelukast (SINGULAIR) 10 MG tablet Take 10 mg by mouth at bedtime.     Multiple Vitamins-Minerals (HAIR SKIN & NAILS ADVANCED PO) Take 2 tablets by mouth 2 (two) times daily.     mupirocin ointment (BACTROBAN) 2 % Place 1 application into the nose 2 (two) times daily as needed.     predniSONE (DELTASONE) 10 MG tablet Take 10 mg by mouth daily with breakfast.     traMADol (ULTRAM) 50 MG tablet Take 50 mg by mouth 2 (two) times daily as needed for moderate pain.      Vitamin D, Ergocalciferol, (DRISDOL) 1.25 MG (50000 UT) CAPS capsule Take 1 capsule (50,000 Units total) by mouth  every 3 (three) days. On Friday 10 capsule 0   No current facility-administered medications on file prior to visit.     PAST MEDICAL HISTORY: Past Medical History:  Diagnosis Date   ADD (attention deficit disorder)    Anemia    Anxiety    Arthritis    Asthma    Back pain    CFS (chronic fatigue syndrome)    Colitis    Depression    Diabetes (HCC)    Dyspnea    Fibromyalgia    GERD (gastroesophageal reflux disease)    Hx of blood clots    IBS (irritable bowel syndrome)    Joint pain    Left knee pain    Leg edema    Osteoarthritis    Prediabetes    Pulmonary embolism (HCC)    RA (rheumatoid arthritis) (HCC)    RA (rheumatoid arthritis) (HCC)    RLS (restless legs syndrome)    Sleep apnea    Vitamin B 12 deficiency    Vitamin D deficiency     PAST SURGICAL HISTORY: Past Surgical History:  Procedure Laterality Date   EYE SURGERY     MOUTH SURGERY      SOCIAL HISTORY: Social History   Tobacco Use   Smoking status: Former Smoker    Packs/day: 1.50    Years: 8.00    Pack years: 12.00   Smokeless tobacco: Never Used  Substance Use Topics   Alcohol use: No    Alcohol/week: 0.0 standard drinks   Drug use: No    FAMILY HISTORY: Family History  Problem Relation Age of Onset   Pneumonia Mother    Asthma Mother    Diabetes Mother    Hypertension Mother    Depression Mother    Anxiety disorder Mother    Eating disorder Mother    Obesity Mother    Sudden death Mother    Cancer Father        Brain tumor   Asthma Father     ROS: Review of Systems  Constitutional: Positive for weight loss.  Gastrointestinal: Negative for nausea and vomiting.  Musculoskeletal:       Negative for muscle weakness    PHYSICAL EXAM: Blood pressure 121/74, pulse 89, temperature 97.8 F (36.6 C), temperature source Oral, height 5' 4"  (1.626 m), weight 265 lb (120.2 kg), last menstrual period 05/31/2012, SpO2 99 %. Body mass  index is 45.49 kg/m. Physical Exam Vitals signs reviewed.  Constitutional:      Appearance: Normal appearance. She is well-developed. She is obese.  Cardiovascular:     Rate and Rhythm: Normal rate.  Pulmonary:     Effort: Pulmonary effort is normal.  Musculoskeletal: Normal range of motion.  Skin:    General: Skin is warm and dry.  Neurological:     Mental Status: She is alert and oriented to person, place, and time.  Psychiatric:        Mood and Affect: Mood normal.        Behavior: Behavior normal.     RECENT LABS AND TESTS: BMET    Component Value Date/Time   NA 138 10/08/2018 1421   K 4.5 10/08/2018 1421   CL 99 10/08/2018 1421   CO2 25 10/08/2018 1421   GLUCOSE 131 (H) 10/08/2018 1421   GLUCOSE 130 (H) 11/06/2017 0222   BUN 21 10/08/2018 1421   CREATININE 0.76 10/08/2018 1421   CALCIUM 9.6 10/08/2018 1421   GFRNONAA 86 10/08/2018 1421   GFRAA 99 10/08/2018 1421   Lab Results  Component Value Date   HGBA1C 6.6 (H) 10/08/2018   HGBA1C 6.8 (H) 05/14/2018   Lab Results  Component Value Date   INSULIN 6.6 10/08/2018   INSULIN 9.8 05/14/2018   CBC    Component Value Date/Time   WBC 8.0 05/14/2018 0941   WBC 10.1 11/06/2017 0222   RBC 3.97 05/14/2018 0941   RBC 4.31 11/06/2017 0222   HGB 12.0 05/14/2018 0941   HCT 37.4 05/14/2018 0941   PLT 380 11/06/2017 0222   MCV 94 05/14/2018 0941   MCH 30.2 05/14/2018 0941   MCH 29.0 11/06/2017 0222   MCHC 32.1 05/14/2018 0941   MCHC 32.3 11/06/2017 0222   RDW 14.1 05/14/2018 0941   LYMPHSABS 1.9 05/14/2018 0941   MONOABS 0.6 11/06/2017 0222   EOSABS 0.2 05/14/2018 0941   BASOSABS 0.1 05/14/2018 0941   Iron/TIBC/Ferritin/ %Sat No results found for: IRON, TIBC, FERRITIN, IRONPCTSAT Lipid Panel     Component Value Date/Time   CHOL 188 10/08/2018 1421   TRIG 168 (H) 10/08/2018 1421   HDL 71 10/08/2018 1421   LDLCALC 83 10/08/2018 1421   Hepatic Function Panel     Component Value Date/Time   PROT 7.1  10/08/2018 1421   ALBUMIN 4.5 10/08/2018 1421   AST 11 10/08/2018 1421   ALT 20 10/08/2018 1421   ALKPHOS 74 10/08/2018 1421   BILITOT 0.4 10/08/2018 1421      Component Value Date/Time   TSH 5.150 (H) 05/14/2018 0941     Ref. Range 10/08/2018 14:21  Vitamin D, 25-Hydroxy Latest Ref Range: 30.0 - 100.0 ng/mL 30.2    OBESITY BEHAVIORAL INTERVENTION VISIT  Today's visit was # 19   Starting weight: 281 lbs Starting date: 05/14/2018 Today's weight : 265 lbs Today's date: 04/02/2019 Total lbs lost to date: 16    04/02/2019  Height '5\' 4"'$  (1.626 m)  Weight 265 lb (120.2 kg)  BMI (Calculated) 45.46  BLOOD PRESSURE - SYSTOLIC 644  BLOOD PRESSURE - DIASTOLIC 74   Body Fat % 03.4 %    ASK: We discussed the diagnosis of obesity with Alyson Ingles today and Celestine agreed to give [name] permission to discuss obesity behavioral modification therapy today.  ASSESS: Diamante has the diagnosis of obesity and her BMI today is 45.46 Melanie is in the action stage of change   ADVISE: Mertis was educated on the multiple health risks of obesity as well as the benefit of weight loss to improve her health. She was advised of the need for long term treatment and the importance of lifestyle modifications to improve her current health and to decrease her risk of future health problems.  AGREE: Multiple dietary modification options and treatment options were discussed and  Hoda agreed to  follow the recommendations documented in the above note.  ARRANGE: Tremaine was educated on the importance of frequent visits to treat obesity as outlined per CMS and USPSTF guidelines and agreed to schedule her next follow up appointment today.  Corey Skains, am acting as transcriptionist for Abby Potash, PA-C I, Abby Potash, PA-C have reviewed above note and agree with its content

## 2019-04-17 ENCOUNTER — Ambulatory Visit (INDEPENDENT_AMBULATORY_CARE_PROVIDER_SITE_OTHER): Payer: Medicare Other | Admitting: Physician Assistant

## 2019-04-17 ENCOUNTER — Other Ambulatory Visit: Payer: Self-pay

## 2019-04-17 ENCOUNTER — Encounter (INDEPENDENT_AMBULATORY_CARE_PROVIDER_SITE_OTHER): Payer: Self-pay | Admitting: Physician Assistant

## 2019-04-17 VITALS — BP 121/85 | HR 74 | Temp 97.9°F | Ht 64.0 in | Wt 262.0 lb

## 2019-04-17 DIAGNOSIS — E559 Vitamin D deficiency, unspecified: Secondary | ICD-10-CM

## 2019-04-17 DIAGNOSIS — E119 Type 2 diabetes mellitus without complications: Secondary | ICD-10-CM | POA: Diagnosis not present

## 2019-04-17 DIAGNOSIS — E7849 Other hyperlipidemia: Secondary | ICD-10-CM | POA: Diagnosis not present

## 2019-04-17 DIAGNOSIS — Z6841 Body Mass Index (BMI) 40.0 and over, adult: Secondary | ICD-10-CM

## 2019-04-17 MED ORDER — METFORMIN HCL ER 500 MG PO TB24: 500.0000 mg | ORAL_TABLET | Freq: Two times a day (BID) | ORAL | 0 refills | Status: DC

## 2019-04-18 LAB — HEMOGLOBIN A1C
Est. average glucose Bld gHb Est-mCnc: 126 mg/dL
Hgb A1c MFr Bld: 6 % — ABNORMAL HIGH (ref 4.8–5.6)

## 2019-04-18 LAB — COMPREHENSIVE METABOLIC PANEL
ALT: 15 IU/L (ref 0–32)
AST: 9 IU/L (ref 0–40)
Albumin/Globulin Ratio: 1.6 (ref 1.2–2.2)
Albumin: 4.4 g/dL (ref 3.8–4.9)
Alkaline Phosphatase: 73 IU/L (ref 39–117)
BUN/Creatinine Ratio: 37 — ABNORMAL HIGH (ref 12–28)
BUN: 25 mg/dL (ref 8–27)
Bilirubin Total: 0.4 mg/dL (ref 0.0–1.2)
CO2: 28 mmol/L (ref 20–29)
Calcium: 9.3 mg/dL (ref 8.7–10.3)
Chloride: 99 mmol/L (ref 96–106)
Creatinine, Ser: 0.67 mg/dL (ref 0.57–1.00)
GFR calc Af Amer: 110 mL/min/{1.73_m2} (ref >59)
GFR calc non Af Amer: 96 mL/min/{1.73_m2} (ref >59)
Globulin, Total: 2.8 g/dL (ref 1.5–4.5)
Glucose: 88 mg/dL (ref 65–99)
Potassium: 4.6 mmol/L (ref 3.5–5.2)
Sodium: 139 mmol/L (ref 134–144)
Total Protein: 7.2 g/dL (ref 6.0–8.5)

## 2019-04-18 LAB — LIPID PANEL WITH LDL/HDL RATIO
Cholesterol, Total: 176 mg/dL (ref 100–199)
HDL: 65 mg/dL (ref >39)
LDL Chol Calc (NIH): 87 mg/dL (ref 0–99)
LDL/HDL Ratio: 1.3 ratio (ref 0.0–3.2)
Triglycerides: 139 mg/dL (ref 0–149)
VLDL Cholesterol Cal: 24 mg/dL (ref 5–40)

## 2019-04-18 LAB — VITAMIN D 25 HYDROXY (VIT D DEFICIENCY, FRACTURES): Vit D, 25-Hydroxy: 40.3 ng/mL (ref 30.0–100.0)

## 2019-04-18 LAB — INSULIN, RANDOM: INSULIN: 4.2 u[IU]/mL (ref 2.6–24.9)

## 2019-04-22 NOTE — Progress Notes (Signed)
Office: [phone]  /  Fax: [phone]   HPI:   Chief Complaint: OBESITY Sally [name] is here to discuss her progress with her obesity treatment plan. She is on the Category 3 plan and is following her eating plan approximately 90 % of the time. She states she is walking 30 minutes 5 times per week. [name] did very well getting in more protein over the last few weeks. She is using protein shakes as her snack calories. Her weight is 262 lb (118.8 kg) today and has had a weight loss of 3 pounds over a period of 2 weeks since her last visit. She has lost 19 lbs since starting treatment with [name].  Diabetes II Sally [name] has a diagnosis of diabetes type II. [name] is on metformin and she denies nausea, vomiting or diarrhea. She has been working on intensive lifestyle modifications including diet, exercise, and weight loss to help control her blood glucose levels. [name] denies polyphagia.  Vitamin D deficiency Sally [name] has a diagnosis of vitamin D deficiency. [name] is currently taking vit D and she denies nausea, vomiting or muscle weakness.  Hyperlipidemia Sally [name] has hyperlipidemia and she is not on medications. She has been trying to improve her cholesterol levels with intensive lifestyle modification including a low saturated fat diet, exercise and weight loss. She denies any chest pain.  ASSESSMENT AND PLAN:  Type 2 diabetes mellitus without complication, without long-term current use of insulin ([name]) - Plan: metFORMIN (GLUCOPHAGE-XR) 500 MG 24 hr tablet, Comprehensive metabolic panel, Hemoglobin A1c, Insulin, random  Vitamin D deficiency - Plan: VITAMIN D 25 Hydroxy (Vit-D Deficiency, Fractures)  Other hyperlipidemia - Plan: Lipid Panel With LDL/HDL Ratio  Class 3 severe obesity with serious comorbidity and body mass index (BMI) of 45.0 to 49.9 in adult, unspecified obesity type ([name])  PLAN:  Diabetes II Sally [name] has been given extensive diabetes education by myself today including ideal  fasting and post-prandial blood glucose readings, individual ideal Hgb A1c goals and hypoglycemia prevention. We discussed the importance of good blood sugar control to decrease the likelihood of diabetic complications such as nephropathy, neuropathy, limb loss, blindness, coronary artery disease, and death. We discussed the importance of intensive lifestyle modification including diet, exercise and weight loss as the first line treatment for diabetes. [name] agrees to continue metformin 500 mg two times daily #180 with no refills and follow up at the agreed upon time.  Vitamin D Deficiency Sally [name] was informed that low vitamin D levels contributes to fatigue and are associated with obesity, breast, and colon cancer. Sally [name] will continue to take prescription Vit D @50 ,000 IU every 3 days and she will follow up for routine testing of vitamin D, at least 2-3 times per year. She was informed of the risk of over-replacement of vitamin D and agrees to not increase her dose unless she discusses this with [name] first.  Hyperlipidemia Sally [name] was informed of the American Heart Association Guidelines emphasizing intensive lifestyle modifications as the first line treatment for hyperlipidemia. We discussed many lifestyle modifications today in depth, and [name] will continue to work on decreasing saturated fats such as fatty red meat, butter and many fried foods. She will also increase vegetables and lean protein in her diet and continue to work on exercise and weight loss efforts.  Obesity Sally [name] is currently in the action stage of change. As such, her goal is to continue with weight loss efforts She has agreed to follow the Category 3 plan Sally [name] has been instructed to work up to  a goal of 150 minutes of combined cardio and strengthening exercise per week for weight loss and overall health benefits. We discussed the following Behavioral Modification Strategies today: keeping healthy foods in the home and work on  meal planning and easy cooking plans  Sally [name] has agreed to follow up with our clinic in 2 weeks. She was informed of the importance of frequent follow up visits to maximize her success with intensive lifestyle modifications for her multiple health conditions.  ALLERGIES: No Known Allergies  MEDICATIONS: Current Outpatient Medications on File Prior to Visit  Medication Sig Dispense Refill   omeprazole (PRILOSEC) 20 MG capsule Take 20 mg by mouth daily.     albuterol (PROVENTIL HFA;VENTOLIN HFA) 108 (90 BASE) MCG/ACT inhaler Inhale 2 puffs into the lungs every 6 (six) hours as needed. FOR WHEEZING      blood glucose meter kit and supplies Dispense based on patient and insurance preference. Use up to four times daily as directed. (FOR ICD-10 E10.9, E11.9). Test twice daily 1 each 0   cyclobenzaprine (FLEXERIL) 10 MG tablet Take 10 mg by mouth 3 (three) times daily as needed for muscle spasms.     DULoxetine (CYMBALTA) 30 MG capsule Take 30 mg by mouth 3 (three) times daily.     Etanercept (ENBREL [place]) Inject into the skin.     furosemide (LASIX) 20 MG tablet Take 20 mg by mouth daily as needed.     gabapentin (NEURONTIN) 300 MG capsule Take 300 mg by mouth 2 (two) times daily.   0   meloxicam (MOBIC) 15 MG tablet Take 15 mg by mouth at bedtime.  3   methotrexate (RHEUMATREX) 2.5 MG tablet Take 2.5 mg by mouth once a week. Take 8 tablets by mouth once a week on Friday.  3   montelukast (SINGULAIR) 10 MG tablet Take 10 mg by mouth at bedtime.     Multiple Vitamins-Minerals (HAIR SKIN & NAILS ADVANCED PO) Take 2 tablets by mouth 2 (two) times daily.     mupirocin ointment (BACTROBAN) 2 % Place 1 application into the nose 2 (two) times daily as needed.     predniSONE (DELTASONE) 10 MG tablet Take 10 mg by mouth daily with breakfast.     traMADol (ULTRAM) 50 MG tablet Take 50 mg by mouth 2 (two) times daily as needed for moderate pain.      Vitamin D, Ergocalciferol, (DRISDOL) 1.25  MG (50000 UT) CAPS capsule Take 1 capsule (50,000 Units total) by mouth every 3 (three) days. On Friday 10 capsule 0   No current facility-administered medications on file prior to visit.     PAST MEDICAL HISTORY: Past Medical History:  Diagnosis Date   ADD (attention deficit disorder)    Anemia    Anxiety    Arthritis    Asthma    Back pain    CFS (chronic fatigue syndrome)    Colitis    Depression    Diabetes (HCC)    Dyspnea    Fibromyalgia    GERD (gastroesophageal reflux disease)    Hx of blood clots    IBS (irritable bowel syndrome)    Joint pain    Left knee pain    Leg edema    Osteoarthritis    Prediabetes    Pulmonary embolism (HCC)    RA (rheumatoid arthritis) (HCC)    RA (rheumatoid arthritis) (HCC)    RLS (restless legs syndrome)    Sleep apnea    Vitamin B 12 deficiency  Vitamin D deficiency     PAST SURGICAL HISTORY: Past Surgical History:  Procedure Laterality Date   EYE SURGERY     MOUTH SURGERY      SOCIAL HISTORY: Social History   Tobacco Use   Smoking status: Former Smoker    Packs/day: 1.50    Years: 8.00    Pack years: 12.00   Smokeless tobacco: Never Used  Substance Use Topics   Alcohol use: No    Alcohol/week: 0.0 standard drinks   Drug use: No    FAMILY HISTORY: Family History  Problem Relation Age of Onset   Pneumonia Mother    Asthma Mother    Diabetes Mother    Hypertension Mother    Depression Mother    Anxiety disorder Mother    Eating disorder Mother    Obesity Mother    Sudden death Mother    Cancer Father        Brain tumor   Asthma Father     ROS: Review of Systems  Constitutional: Positive for weight loss.  Cardiovascular: Negative for chest pain.  Gastrointestinal: Negative for diarrhea, nausea and vomiting.  Musculoskeletal:       Negative for muscle weakness  Endo/Heme/Allergies:       Negative for polyphagia    PHYSICAL EXAM: Blood pressure  121/85, pulse 74, temperature 97.9 F (36.6 C), temperature source Oral, height 5' 4"  (1.626 m), weight 262 lb (118.8 kg), last menstrual period 05/31/2012, SpO2 99 %. Body mass index is 44.97 kg/m. Physical Exam Vitals signs reviewed.  Constitutional:      Appearance: Normal appearance. She is well-developed. She is obese.  Cardiovascular:     Rate and Rhythm: Normal rate.  Pulmonary:     Effort: Pulmonary effort is normal.  Musculoskeletal: Normal range of motion.  Skin:    General: Skin is warm and dry.  Neurological:     Mental Status: She is alert and oriented to person, place, and time.  Psychiatric:        Mood and Affect: Mood normal.        Behavior: Behavior normal.     RECENT LABS AND TESTS: BMET    Component Value Date/Time   NA 139 04/17/2019 0935   K 4.6 04/17/2019 0935   CL 99 04/17/2019 0935   CO2 28 04/17/2019 0935   GLUCOSE 88 04/17/2019 0935   GLUCOSE 130 (H) 11/06/2017 0222   BUN 25 04/17/2019 0935   CREATININE 0.67 04/17/2019 0935   CALCIUM 9.3 04/17/2019 0935   GFRNONAA 96 04/17/2019 0935   GFRAA 110 04/17/2019 0935   Lab Results  Component Value Date   HGBA1C 6.0 (H) 04/17/2019   HGBA1C 6.6 (H) 10/08/2018   HGBA1C 6.8 (H) 05/14/2018   Lab Results  Component Value Date   INSULIN 4.2 04/17/2019   INSULIN 6.6 10/08/2018   INSULIN 9.8 05/14/2018   CBC    Component Value Date/Time   WBC 8.0 05/14/2018 0941   WBC 10.1 11/06/2017 0222   RBC 3.97 05/14/2018 0941   RBC 4.31 11/06/2017 0222   HGB 12.0 05/14/2018 0941   HCT 37.4 05/14/2018 0941   PLT 380 11/06/2017 0222   MCV 94 05/14/2018 0941   MCH 30.2 05/14/2018 0941   MCH 29.0 11/06/2017 0222   MCHC 32.1 05/14/2018 0941   MCHC 32.3 11/06/2017 0222   RDW 14.1 05/14/2018 0941   LYMPHSABS 1.9 05/14/2018 0941   MONOABS 0.6 11/06/2017 0222   EOSABS 0.2 05/14/2018 0941  BASOSABS 0.1 05/14/2018 0941   Iron/TIBC/Ferritin/ %Sat No results found for: IRON, TIBC, FERRITIN,  IRONPCTSAT Lipid Panel     Component Value Date/Time   CHOL 176 04/17/2019 0935   TRIG 139 04/17/2019 0935   HDL 65 04/17/2019 0935   LDLCALC 83 10/08/2018 1421   Hepatic Function Panel     Component Value Date/Time   PROT 7.2 04/17/2019 0935   ALBUMIN 4.4 04/17/2019 0935   AST 9 04/17/2019 0935   ALT 15 04/17/2019 0935   ALKPHOS 73 04/17/2019 0935   BILITOT 0.4 04/17/2019 0935      Component Value Date/Time   TSH 5.150 (H) 05/14/2018 0941     Ref. Range 10/08/2018 14:21  Vitamin D, 25-Hydroxy Latest Ref Range: 30.0 - 100.0 ng/mL 30.2    OBESITY BEHAVIORAL INTERVENTION VISIT  Today's visit was # 20   Starting weight: 281 lbs Starting date: 05/14/2018 Today's weight : 262 lbs  Today's date: 04/17/2019 Total lbs lost to date: 19    04/17/2019  Height 5' 4"  (1.626 m)  Weight 262 lb (118.8 kg)  BMI (Calculated) 44.95  BLOOD PRESSURE - SYSTOLIC 240  BLOOD PRESSURE - DIASTOLIC 85   Body Fat % 97.3 %    ASK: We discussed the diagnosis of obesity with Alyson Ingles today and Sylvanna agreed to give [name] permission to discuss obesity behavioral modification therapy today.  ASSESS: Sheylin has the diagnosis of obesity and her BMI today is 44.95 Kaylan is in the action stage of change   ADVISE: Gracynn was educated on the multiple health risks of obesity as well as the benefit of weight loss to improve her health. She was advised of the need for long term treatment and the importance of lifestyle modifications to improve her current health and to decrease her risk of future health problems.  AGREE: Multiple dietary modification options and treatment options were discussed and  Kimberlin agreed to follow the recommendations documented in the above note.  ARRANGE: Aeris was educated on the importance of frequent visits to treat obesity as outlined per CMS and USPSTF guidelines and agreed to schedule her next follow up appointment today.  Corey Skains, am acting as  transcriptionist for Abby Potash, PA-C I, Abby Potash, PA-C have reviewed above note and agree with its content

## 2019-04-29 ENCOUNTER — Encounter: Payer: Self-pay | Admitting: Gynecology

## 2019-05-01 ENCOUNTER — Other Ambulatory Visit: Payer: Self-pay

## 2019-05-01 ENCOUNTER — Telehealth (INDEPENDENT_AMBULATORY_CARE_PROVIDER_SITE_OTHER): Payer: Medicare Other | Admitting: Physician Assistant

## 2019-05-01 ENCOUNTER — Encounter (INDEPENDENT_AMBULATORY_CARE_PROVIDER_SITE_OTHER): Payer: Self-pay | Admitting: Physician Assistant

## 2019-05-01 DIAGNOSIS — E119 Type 2 diabetes mellitus without complications: Secondary | ICD-10-CM

## 2019-05-01 DIAGNOSIS — Z6841 Body Mass Index (BMI) 40.0 and over, adult: Secondary | ICD-10-CM

## 2019-05-05 NOTE — Progress Notes (Signed)
Office: [phone]  /  Fax: [phone] TeleHealth Visit:  Sally [name] has verbally consented to this TeleHealth visit today. The patient is located at work, the provider is located at the [employer] [facility] office. The participants in this visit include the listed provider and patient and any and all parties involved. The visit was conducted today via FaceTime.  HPI:   Chief Complaint: OBESITY Sally [name] is here to discuss her progress with her obesity treatment plan. She is on the Category 3 plan and is following her eating plan approximately 90 % of the time. She states she is exercising 0 minutes 0 times per week. Sally [name]'s most recent weight is 245 pounds (05/01/19). She reports feeling down recently with the change in weather and shorter days, and therefore she has been doing some comfort eating. She is skipping dinner on some days. We were unable to weigh the patient today for this TeleHealth visit. She feels as if she has maintained weight since her last visit. She has lost 19 lbs since starting treatment with [name].  Diabetes II Sally [name] has a diagnosis of diabetes type II. Sally [name] states fasting BGs average 130. She is not on metformin and she denies nausea, vomiting or diarrhea. Sally [name] has no hypoglycemia.Her last A1c was at 6.0 She has been working on intensive lifestyle modifications including diet, exercise, and weight loss to help control her blood glucose levels.  ASSESSMENT AND PLAN:  Type 2 diabetes mellitus without complication, without long-term current use of insulin (HCC)  Class 3 severe obesity with serious comorbidity and body mass index (BMI) of 40.0 to 44.9 in adult, unspecified obesity type ([place])  PLAN:  Diabetes II Sally [name] has been given extensive diabetes education by myself today including ideal fasting and post-prandial blood glucose readings, individual ideal Hgb A1c goals and hypoglycemia prevention. We discussed the importance of good blood sugar  control to decrease the likelihood of diabetic complications such as nephropathy, neuropathy, limb loss, blindness, coronary artery disease, and death. We discussed the importance of intensive lifestyle modification including diet, exercise and weight loss as the first line treatment for diabetes. Sally [name] will continue with medications and weight loss. Sally [name] will follow up at the agreed upon time.  Obesity Sally [name] is currently in the action stage of change. As such, her goal is to continue with weight loss efforts She has agreed to keep a food journal with 450 to 600 calories and 40 grams of protein at supper daily and follow the Category 3 plan Sally [name] has been instructed to work up to a goal of 150 minutes of combined cardio and strengthening exercise per week for weight loss and overall health benefits. We discussed the following Behavioral Modification Strategies today: increasing lean protein intake and no skipping meals  Sally [name] has agreed to follow up with our clinic in 2 weeks. She was informed of the importance of frequent follow up visits to maximize her success with intensive lifestyle modifications for her multiple health conditions.  ALLERGIES: No Known Allergies  MEDICATIONS: Current Outpatient Medications on File Prior to Visit  Medication Sig Dispense Refill   albuterol (PROVENTIL HFA;VENTOLIN HFA) 108 (90 BASE) MCG/ACT inhaler Inhale 2 puffs into the lungs every 6 (six) hours as needed. FOR WHEEZING      blood glucose meter kit and supplies Dispense based on patient and insurance preference. Use up to four times daily as directed. (FOR ICD-10 E10.9, E11.9). Test twice daily 1 each 0   cyclobenzaprine (FLEXERIL) 10 MG tablet Take  10 mg by mouth 3 (three) times daily as needed for muscle spasms.     DULoxetine (CYMBALTA) 30 MG capsule Take 30 mg by mouth 3 (three) times daily.     Etanercept (ENBREL ) Inject into the skin.     furosemide (LASIX) 20 MG tablet Take 20 mg by  mouth daily as needed.     gabapentin (NEURONTIN) 300 MG capsule Take 300 mg by mouth 2 (two) times daily.   0   meloxicam (MOBIC) 15 MG tablet Take 15 mg by mouth at bedtime.  3   metFORMIN (GLUCOPHAGE-XR) 500 MG 24 hr tablet Take 1 tablet (500 mg total) by mouth 2 (two) times daily. 180 tablet 0   methotrexate (RHEUMATREX) 2.5 MG tablet Take 2.5 mg by mouth once a week. Take 8 tablets by mouth once a week on Friday.  3   montelukast (SINGULAIR) 10 MG tablet Take 10 mg by mouth at bedtime.     Multiple Vitamins-Minerals (HAIR SKIN & NAILS ADVANCED PO) Take 2 tablets by mouth 2 (two) times daily.     mupirocin ointment (BACTROBAN) 2 % Place 1 application into the nose 2 (two) times daily as needed.     omeprazole (PRILOSEC) 20 MG capsule Take 20 mg by mouth daily.     predniSONE (DELTASONE) 10 MG tablet Take 10 mg by mouth daily with breakfast.     traMADol (ULTRAM) 50 MG tablet Take 50 mg by mouth 2 (two) times daily as needed for moderate pain.      Vitamin D, Ergocalciferol, (DRISDOL) 1.25 MG (50000 UT) CAPS capsule Take 1 capsule (50,000 Units total) by mouth every 3 (three) days. On Friday 10 capsule 0   No current facility-administered medications on file prior to visit.     PAST MEDICAL HISTORY: Past Medical History:  Diagnosis Date   ADD (attention deficit disorder)    Anemia    Anxiety    Arthritis    Asthma    Back pain    CFS (chronic fatigue syndrome)    Colitis    Depression    Diabetes (HCC)    Dyspnea    Fibromyalgia    GERD (gastroesophageal reflux disease)    Hx of blood clots    IBS (irritable bowel syndrome)    Joint pain    Left knee pain    Leg edema    Osteoarthritis    Prediabetes    Pulmonary embolism (HCC)    RA (rheumatoid arthritis) (HCC)    RA (rheumatoid arthritis) (HCC)    RLS (restless legs syndrome)    Sleep apnea    Vitamin B 12 deficiency    Vitamin D deficiency     PAST SURGICAL HISTORY: Past  Surgical History:  Procedure Laterality Date   EYE SURGERY     MOUTH SURGERY      SOCIAL HISTORY: Social History   Tobacco Use   Smoking status: Former Smoker    Packs/day: 1.50    Years: 8.00    Pack years: 12.00   Smokeless tobacco: Never Used  Substance Use Topics   Alcohol use: No    Alcohol/week: 0.0 standard drinks   Drug use: No    FAMILY HISTORY: Family History  Problem Relation Age of Onset   Pneumonia Mother    Asthma Mother    Diabetes Mother    Hypertension Mother    Depression Mother    Anxiety disorder Mother    Eating disorder Mother    Obesity  Mother    Sudden death Mother    Cancer Father        Brain tumor   Asthma Father     ROS: Review of Systems  Constitutional: Negative for weight loss.  Gastrointestinal: Negative for diarrhea, nausea and vomiting.  Endo/Heme/Allergies:       Negative for hypoglycemia    PHYSICAL EXAM: Pt in no acute distress  RECENT LABS AND TESTS: BMET    Component Value Date/Time   NA 139 04/17/2019 0935   K 4.6 04/17/2019 0935   CL 99 04/17/2019 0935   CO2 28 04/17/2019 0935   GLUCOSE 88 04/17/2019 0935   GLUCOSE 130 (H) 11/06/2017 0222   BUN 25 04/17/2019 0935   CREATININE 0.67 04/17/2019 0935   CALCIUM 9.3 04/17/2019 0935   GFRNONAA 96 04/17/2019 0935   GFRAA 110 04/17/2019 0935   Lab Results  Component Value Date   HGBA1C 6.0 (H) 04/17/2019   HGBA1C 6.6 (H) 10/08/2018   HGBA1C 6.8 (H) 05/14/2018   Lab Results  Component Value Date   INSULIN 4.2 04/17/2019   INSULIN 6.6 10/08/2018   INSULIN 9.8 05/14/2018   CBC    Component Value Date/Time   WBC 8.0 05/14/2018 0941   WBC 10.1 11/06/2017 0222   RBC 3.97 05/14/2018 0941   RBC 4.31 11/06/2017 0222   HGB 12.0 05/14/2018 0941   HCT 37.4 05/14/2018 0941   PLT 380 11/06/2017 0222   MCV 94 05/14/2018 0941   MCH 30.2 05/14/2018 0941   MCH 29.0 11/06/2017 0222   MCHC 32.1 05/14/2018 0941   MCHC 32.3 11/06/2017 0222   RDW  14.1 05/14/2018 0941   LYMPHSABS 1.9 05/14/2018 0941   MONOABS 0.6 11/06/2017 0222   EOSABS 0.2 05/14/2018 0941   BASOSABS 0.1 05/14/2018 0941   Iron/TIBC/Ferritin/ %Sat No results found for: IRON, TIBC, FERRITIN, IRONPCTSAT Lipid Panel     Component Value Date/Time   CHOL 176 04/17/2019 0935   TRIG 139 04/17/2019 0935   HDL 65 04/17/2019 0935   LDLCALC 87 04/17/2019 0935   Hepatic Function Panel     Component Value Date/Time   PROT 7.2 04/17/2019 0935   ALBUMIN 4.4 04/17/2019 0935   AST 9 04/17/2019 0935   ALT 15 04/17/2019 0935   ALKPHOS 73 04/17/2019 0935   BILITOT 0.4 04/17/2019 0935      Component Value Date/Time   TSH 5.150 (H) 05/14/2018 0941     Ref. Range 04/17/2019 09:35  Vitamin D, 25-Hydroxy Latest Ref Range: 30.0 - 100.0 ng/mL 40.3    I, Doreene Nest, am acting as Location manager for Abby Potash, PA-C I, Abby Potash, PA-C have reviewed above note and agree with its content

## 2019-05-19 ENCOUNTER — Other Ambulatory Visit: Payer: Self-pay

## 2019-05-19 ENCOUNTER — Encounter (INDEPENDENT_AMBULATORY_CARE_PROVIDER_SITE_OTHER): Payer: Self-pay | Admitting: Physician Assistant

## 2019-05-19 ENCOUNTER — Ambulatory Visit (INDEPENDENT_AMBULATORY_CARE_PROVIDER_SITE_OTHER): Payer: Medicare Other | Admitting: Physician Assistant

## 2019-05-19 VITALS — BP 121/80 | HR 93 | Temp 98.2°F | Ht 64.0 in | Wt 263.0 lb

## 2019-05-19 DIAGNOSIS — E119 Type 2 diabetes mellitus without complications: Secondary | ICD-10-CM

## 2019-05-19 DIAGNOSIS — Z6841 Body Mass Index (BMI) 40.0 and over, adult: Secondary | ICD-10-CM | POA: Diagnosis not present

## 2019-05-19 DIAGNOSIS — E559 Vitamin D deficiency, unspecified: Secondary | ICD-10-CM

## 2019-05-19 MED ORDER — VITAMIN D (ERGOCALCIFEROL) 1.25 MG (50000 UNIT) PO CAPS: 50000.0000 [IU] | ORAL_CAPSULE | ORAL | 0 refills | Status: DC

## 2019-05-21 NOTE — Progress Notes (Signed)
Office: [phone]  /  Fax: [phone]   HPI:   Chief Complaint: OBESITY Tamea is here to discuss her progress with her obesity treatment plan. She is on the Category 3 plan and is following her eating plan approximately 95 % of the time. She states she is walking a lot during the day for exercise. Krissia reports that she is not eating enough protein for breakfast and she is not weighing her meat. Her weight is 263 lb (119.3 kg) today and has had a weight loss of 1 pound since her last in-office visit. She has lost 18 lbs since starting treatment with [name].  Vitamin D deficiency Abbi has a diagnosis of vitamin D deficiency. Micala is currently taking vit D and she denies nausea, vomiting or muscle weakness.  Diabetes II Taren has a diagnosis of diabetes type II. She is on Metformin and she denies nausea, vomiting or diarrhea. Han reports that she has forgotten to journal. She has been working on intensive lifestyle modifications including diet, exercise, and weight loss to help control her blood glucose levels. Maame denies polyphagia.  ASSESSMENT AND PLAN:  Vitamin D deficiency - Plan: Vitamin D, Ergocalciferol, (DRISDOL) 1.25 MG (50000 UT) CAPS capsule  Type 2 diabetes mellitus without complication, without long-term current use of insulin (HCC)  Class 3 severe obesity with serious comorbidity and body mass index (BMI) of 45.0 to 49.9 in adult, unspecified obesity type ([place])  PLAN:  Vitamin D Deficiency Minah was informed that low vitamin D levels contributes to fatigue and are associated with obesity, breast, and colon cancer. Soumya agrees to continue to take prescription Vit D @50 ,000 IU every 3 days #10 with no refills and she will follow up for routine testing of vitamin D, at least 2-3 times per year. She was informed of the risk of over-replacement of vitamin D and agrees to not increase her dose unless she discusses this with [name] first. Dasie agrees to follow up  with our clinic in 2 weeks.  Diabetes II Almee has been given extensive diabetes education by myself today including ideal fasting and post-prandial blood glucose readings, individual ideal Hgb A1c goals and hypoglycemia prevention. We discussed the importance of good blood sugar control to decrease the likelihood of diabetic complications such as nephropathy, neuropathy, limb loss, blindness, coronary artery disease, and death. We discussed the importance of intensive lifestyle modification including diet, exercise and weight loss as the first line treatment for diabetes. Rea will continue with metformin and weight loss and she will follow up at the agreed upon time.  Obesity Esti is currently in the action stage of change. As such, her goal is to continue with weight loss efforts She has agreed to follow the Category 3 plan Saryiah has been instructed to work up to a goal of 150 minutes of combined cardio and strengthening exercise per week for weight loss and overall health benefits. We discussed the following Behavioral Modification Strategies today: keeping healthy foods in the home and work on meal planning and easy cooking plans  Terah has agreed to follow up with our clinic in 2 weeks. She was informed of the importance of frequent follow up visits to maximize her success with intensive lifestyle modifications for her multiple health conditions.  ALLERGIES: No Known Allergies  MEDICATIONS: Current Outpatient Medications on File Prior to Visit  Medication Sig Dispense Refill   albuterol (PROVENTIL HFA;VENTOLIN HFA) 108 (90 BASE) MCG/ACT inhaler Inhale 2 puffs into the lungs every 6 (six) hours  as needed. FOR WHEEZING      blood glucose meter kit and supplies Dispense based on patient and insurance preference. Use up to four times daily as directed. (FOR ICD-10 E10.9, E11.9). Test twice daily 1 each 0   cyclobenzaprine (FLEXERIL) 10 MG tablet Take 10 mg by mouth 3 (three) times  daily as needed for muscle spasms.     DULoxetine (CYMBALTA) 30 MG capsule Take 30 mg by mouth 3 (three) times daily.     Etanercept (ENBREL [place]) Inject into the skin.     furosemide (LASIX) 20 MG tablet Take 20 mg by mouth daily as needed.     gabapentin (NEURONTIN) 300 MG capsule Take 300 mg by mouth 2 (two) times daily.   0   meloxicam (MOBIC) 15 MG tablet Take 15 mg by mouth at bedtime.  3   metFORMIN (GLUCOPHAGE-XR) 500 MG 24 hr tablet Take 1 tablet (500 mg total) by mouth 2 (two) times daily. 180 tablet 0   methotrexate (RHEUMATREX) 2.5 MG tablet Take 2.5 mg by mouth once a week. Take 8 tablets by mouth once a week on Friday.  3   montelukast (SINGULAIR) 10 MG tablet Take 10 mg by mouth at bedtime.     Multiple Vitamins-Minerals (HAIR SKIN & NAILS ADVANCED PO) Take 2 tablets by mouth 2 (two) times daily.     mupirocin ointment (BACTROBAN) 2 % Place 1 application into the nose 2 (two) times daily as needed.     omeprazole (PRILOSEC) 20 MG capsule Take 20 mg by mouth daily.     predniSONE (DELTASONE) 10 MG tablet Take 10 mg by mouth daily with breakfast.     traMADol (ULTRAM) 50 MG tablet Take 50 mg by mouth 2 (two) times daily as needed for moderate pain.      No current facility-administered medications on file prior to visit.     PAST MEDICAL HISTORY: Past Medical History:  Diagnosis Date   ADD (attention deficit disorder)    Anemia    Anxiety    Arthritis    Asthma    Back pain    CFS (chronic fatigue syndrome)    Colitis    Depression    Diabetes (HCC)    Dyspnea    Fibromyalgia    GERD (gastroesophageal reflux disease)    Hx of blood clots    IBS (irritable bowel syndrome)    Joint pain    Left knee pain    Leg edema    Osteoarthritis    Prediabetes    Pulmonary embolism (HCC)    RA (rheumatoid arthritis) (HCC)    RA (rheumatoid arthritis) (HCC)    RLS (restless legs syndrome)    Sleep apnea    Vitamin B 12 deficiency     Vitamin D deficiency     PAST SURGICAL HISTORY: Past Surgical History:  Procedure Laterality Date   EYE SURGERY     MOUTH SURGERY      SOCIAL HISTORY: Social History   Tobacco Use   Smoking status: Former Smoker    Packs/day: 1.50    Years: 8.00    Pack years: 12.00   Smokeless tobacco: Never Used  Substance Use Topics   Alcohol use: No    Alcohol/week: 0.0 standard drinks   Drug use: No    FAMILY HISTORY: Family History  Problem Relation Age of Onset   Pneumonia Mother    Asthma Mother    Diabetes Mother    Hypertension Mother  Depression Mother    Anxiety disorder Mother    Eating disorder Mother    Obesity Mother    Sudden death Mother    Cancer Father        Brain tumor   Asthma Father     ROS: Review of Systems  Constitutional: Negative for weight loss.  Gastrointestinal: Negative for diarrhea, nausea and vomiting.  Musculoskeletal:       Negative for muscle weakness  Endo/Heme/Allergies:       Negative for polyphagia    PHYSICAL EXAM: Blood pressure 121/80, pulse 93, temperature 98.2 F (36.8 C), temperature source Oral, height 5' 4"  (1.626 m), weight 263 lb (119.3 kg), last menstrual period 05/31/2012, SpO2 97 %. Body mass index is 45.14 kg/m. Physical Exam Vitals signs reviewed.  Constitutional:      Appearance: Normal appearance. She is well-developed. She is obese.  Cardiovascular:     Rate and Rhythm: Normal rate.  Pulmonary:     Effort: Pulmonary effort is normal.  Musculoskeletal: Normal range of motion.  Skin:    General: Skin is warm and dry.  Neurological:     Mental Status: She is alert and oriented to person, place, and time.  Psychiatric:        Mood and Affect: Mood normal.        Behavior: Behavior normal.     RECENT LABS AND TESTS: BMET    Component Value Date/Time   NA 139 04/17/2019 0935   K 4.6 04/17/2019 0935   CL 99 04/17/2019 0935   CO2 28 04/17/2019 0935   GLUCOSE 88 04/17/2019 0935    GLUCOSE 130 (H) 11/06/2017 0222   BUN 25 04/17/2019 0935   CREATININE 0.67 04/17/2019 0935   CALCIUM 9.3 04/17/2019 0935   GFRNONAA 96 04/17/2019 0935   GFRAA 110 04/17/2019 0935   Lab Results  Component Value Date   HGBA1C 6.0 (H) 04/17/2019   HGBA1C 6.6 (H) 10/08/2018   HGBA1C 6.8 (H) 05/14/2018   Lab Results  Component Value Date   INSULIN 4.2 04/17/2019   INSULIN 6.6 10/08/2018   INSULIN 9.8 05/14/2018   CBC    Component Value Date/Time   WBC 8.0 05/14/2018 0941   WBC 10.1 11/06/2017 0222   RBC 3.97 05/14/2018 0941   RBC 4.31 11/06/2017 0222   HGB 12.0 05/14/2018 0941   HCT 37.4 05/14/2018 0941   PLT 380 11/06/2017 0222   MCV 94 05/14/2018 0941   MCH 30.2 05/14/2018 0941   MCH 29.0 11/06/2017 0222   MCHC 32.1 05/14/2018 0941   MCHC 32.3 11/06/2017 0222   RDW 14.1 05/14/2018 0941   LYMPHSABS 1.9 05/14/2018 0941   MONOABS 0.6 11/06/2017 0222   EOSABS 0.2 05/14/2018 0941   BASOSABS 0.1 05/14/2018 0941   Iron/TIBC/Ferritin/ %Sat No results found for: IRON, TIBC, FERRITIN, IRONPCTSAT Lipid Panel     Component Value Date/Time   CHOL 176 04/17/2019 0935   TRIG 139 04/17/2019 0935   HDL 65 04/17/2019 0935   LDLCALC 87 04/17/2019 0935   Hepatic Function Panel     Component Value Date/Time   PROT 7.2 04/17/2019 0935   ALBUMIN 4.4 04/17/2019 0935   AST 9 04/17/2019 0935   ALT 15 04/17/2019 0935   ALKPHOS 73 04/17/2019 0935   BILITOT 0.4 04/17/2019 0935      Component Value Date/Time   TSH 5.150 (H) 05/14/2018 0941     Ref. Range 04/17/2019 09:35  Vitamin D, 25-Hydroxy Latest Ref Range: 30.0 - 100.0  ng/mL 40.3    OBESITY BEHAVIORAL INTERVENTION VISIT  Today's visit was # 22  Starting weight: 281 lbs Starting date: 05/14/2018 Today's weight : 263 lbs Today's date: 05/19/2019 Total lbs lost to date: 18    05/19/2019  Height 5' 4"  (1.626 m)  Weight 263 lb (119.3 kg)  BMI (Calculated) 45.12  BLOOD PRESSURE - SYSTOLIC 106  BLOOD PRESSURE -  DIASTOLIC 80   Body Fat % 26.9 %    ASK: We discussed the diagnosis of obesity with Alyson Ingles today and Kaleigh agreed to give [name] permission to discuss obesity behavioral modification therapy today.  ASSESS: Karne has the diagnosis of obesity and her BMI today is 45.12 Mya is in the action stage of change   ADVISE: Riva was educated on the multiple health risks of obesity as well as the benefit of weight loss to improve her health. She was advised of the need for long term treatment and the importance of lifestyle modifications to improve her current health and to decrease her risk of future health problems.  AGREE: Multiple dietary modification options and treatment options were discussed and  Marilu agreed to follow the recommendations documented in the above note.  ARRANGE: Breane was educated on the importance of frequent visits to treat obesity as outlined per CMS and USPSTF guidelines and agreed to schedule her next follow up appointment today.  Corey Skains, am acting as transcriptionist for Abby Potash, PA-C I, Abby Potash, PA-C have reviewed above note and agree with its content

## 2019-05-30 ENCOUNTER — Other Ambulatory Visit: Payer: Self-pay

## 2019-05-30 DIAGNOSIS — Z20822 Contact with and (suspected) exposure to covid-19: Secondary | ICD-10-CM

## 2019-06-01 LAB — NOVEL CORONAVIRUS, NAA: SARS-CoV-2, NAA: NOT DETECTED

## 2019-06-05 ENCOUNTER — Other Ambulatory Visit (INDEPENDENT_AMBULATORY_CARE_PROVIDER_SITE_OTHER): Payer: Self-pay | Admitting: Physician Assistant

## 2019-06-05 DIAGNOSIS — E119 Type 2 diabetes mellitus without complications: Secondary | ICD-10-CM

## 2019-06-09 ENCOUNTER — Encounter (INDEPENDENT_AMBULATORY_CARE_PROVIDER_SITE_OTHER): Payer: Self-pay | Admitting: Physician Assistant

## 2019-06-09 ENCOUNTER — Other Ambulatory Visit: Payer: Self-pay

## 2019-06-09 ENCOUNTER — Telehealth (INDEPENDENT_AMBULATORY_CARE_PROVIDER_SITE_OTHER): Payer: Medicare Other | Admitting: Physician Assistant

## 2019-06-09 DIAGNOSIS — E119 Type 2 diabetes mellitus without complications: Secondary | ICD-10-CM | POA: Diagnosis not present

## 2019-06-09 DIAGNOSIS — Z6839 Body mass index (BMI) 39.0-39.9, adult: Secondary | ICD-10-CM

## 2019-06-10 NOTE — Progress Notes (Signed)
Office: [phone]  /  Fax: [phone] TeleHealth Visit:  Sally [name] has verbally consented to this TeleHealth visit today. The patient is located at home, the provider is located at the [facility] office. The participants in this visit include the listed provider and patient and any and all parties involved. The visit was conducted today via FaceTime.  HPI:   Chief Complaint: OBESITY Sally [name] is here to discuss her progress with her obesity treatment plan. She is on the Category 3 plan and is following her eating plan approximately 50 % of the time. She states she is exercising 0 minutes 0 times per week. Sally [name]'s most recent weight is 265 pounds (06/08/18). She reports that she has been having bad cravings recently. She is under extra stress, and she has been in more pain than normal.  We were unable to weigh the patient today for this TeleHealth visit. She feels as if she has maintained weight since her last visit. She has lost 18 lbs since starting treatment with [name].  Diabetes II Sally [name] has a diagnosis of diabetes type II. She is on metformin. Sally [name] states fasting BGs range between 128 and 135 and she denies nausea, vomiting, diarrhea or polyphagia. Last A1c was at 6.0 She has been working on intensive lifestyle modifications including diet, exercise, and weight loss to help control her blood glucose levels.  ASSESSMENT AND PLAN:  Type 2 diabetes mellitus without complication, without long-term current use of insulin (HCC)  Class 2 severe obesity with serious comorbidity and body mass index (BMI) of 39.0 to 39.9 in adult, unspecified obesity type (Sally [name])  PLAN:  Diabetes II Sally [name] has been given extensive diabetes education by myself today including ideal fasting and post-prandial blood glucose readings, individual ideal Hgb A1c goals and hypoglycemia prevention. We discussed the importance of good blood sugar control to decrease the likelihood of diabetic  complications such as nephropathy, neuropathy, limb loss, blindness, coronary artery disease, and death. We discussed the importance of intensive lifestyle modification including diet, exercise and weight loss as the first line treatment for diabetes. Sally [name] will continue with medications and weight loss, and she will follow up at the agreed upon time.  Obesity Sally [name] is currently in the action stage of change. As such, her goal is to continue with weight loss efforts She has agreed to follow the Category 3 plan Sally [name] has been instructed to work up to a goal of 150 minutes of combined cardio and strengthening exercise per week for weight loss and overall health benefits. We discussed the following Behavioral Modification Strategies today: increasing lean protein intake, decreasing simple carbohydrates  and work on meal planning and easy cooking plans  Sally [name] has agreed to follow up with our clinic in 3 weeks. She was informed of the importance of frequent follow up visits to maximize her success with intensive lifestyle modifications for her multiple health conditions.  I spent > than 50% of the 25 minute visit on counseling as documented in the note.   ALLERGIES: No Known Allergies  MEDICATIONS: Current Outpatient Medications on File Prior to Visit  Medication Sig Dispense Refill   albuterol (PROVENTIL HFA;VENTOLIN HFA) 108 (90 BASE) MCG/ACT inhaler Inhale 2 puffs into the lungs every 6 (six) hours as needed. FOR WHEEZING      blood glucose meter kit and supplies Dispense based on patient and insurance preference. Use up to four times daily as directed. (FOR ICD-10 E10.9, E11.9). Test twice daily 1 each 0  cyclobenzaprine (FLEXERIL) 10 MG tablet Take 10 mg by mouth 3 (three) times daily as needed for muscle spasms.     DULoxetine (CYMBALTA) 30 MG capsule Take 30 mg by mouth 3 (three) times daily.     Etanercept (ENBREL [place]) Inject into the skin.     furosemide (LASIX) 20 MG tablet  Take 20 mg by mouth daily as needed.     gabapentin (NEURONTIN) 300 MG capsule Take 300 mg by mouth 2 (two) times daily.   0   meloxicam (MOBIC) 15 MG tablet Take 15 mg by mouth at bedtime.  3   metFORMIN (GLUCOPHAGE-XR) 500 MG 24 hr tablet Take 1 tablet (500 mg total) by mouth 2 (two) times daily. 180 tablet 0   methotrexate (RHEUMATREX) 2.5 MG tablet Take 2.5 mg by mouth once a week. Take 8 tablets by mouth once a week on Friday.  3   montelukast (SINGULAIR) 10 MG tablet Take 10 mg by mouth at bedtime.     Multiple Vitamins-Minerals (HAIR SKIN & NAILS ADVANCED PO) Take 2 tablets by mouth 2 (two) times daily.     mupirocin ointment (BACTROBAN) 2 % Place 1 application into the nose 2 (two) times daily as needed.     omeprazole (PRILOSEC) 20 MG capsule Take 20 mg by mouth daily.     predniSONE (DELTASONE) 10 MG tablet Take 10 mg by mouth daily with breakfast.     traMADol (ULTRAM) 50 MG tablet Take 50 mg by mouth 2 (two) times daily as needed for moderate pain.      Vitamin D, Ergocalciferol, (DRISDOL) 1.25 MG (50000 UT) CAPS capsule Take 1 capsule (50,000 Units total) by mouth every 3 (three) days. On Friday 10 capsule 0   No current facility-administered medications on file prior to visit.     PAST MEDICAL HISTORY: Past Medical History:  Diagnosis Date   ADD (attention deficit disorder)    Anemia    Anxiety    Arthritis    Asthma    Back pain    CFS (chronic fatigue syndrome)    Colitis    Depression    Diabetes (HCC)    Dyspnea    Fibromyalgia    GERD (gastroesophageal reflux disease)    Hx of blood clots    IBS (irritable bowel syndrome)    Joint pain    Left knee pain    Leg edema    Osteoarthritis    Prediabetes    Pulmonary embolism (HCC)    RA (rheumatoid arthritis) (HCC)    RA (rheumatoid arthritis) (HCC)    RLS (restless legs syndrome)    Sleep apnea    Vitamin B 12 deficiency    Vitamin D deficiency     PAST SURGICAL  HISTORY: Past Surgical History:  Procedure Laterality Date   EYE SURGERY     MOUTH SURGERY      SOCIAL HISTORY: Social History   Tobacco Use   Smoking status: Former Smoker    Packs/day: 1.50    Years: 8.00    Pack years: 12.00   Smokeless tobacco: Never Used  Substance Use Topics   Alcohol use: No    Alcohol/week: 0.0 standard drinks   Drug use: No    FAMILY HISTORY: Family History  Problem Relation Age of Onset   Pneumonia Mother    Asthma Mother    Diabetes Mother    Hypertension Mother    Depression Mother    Anxiety disorder Mother    Eating  disorder Mother    Obesity Mother    Sudden death Mother    Cancer Father        Brain tumor   Asthma Father     ROS: Review of Systems  Constitutional: Negative for weight loss.  Gastrointestinal: Negative for diarrhea, nausea and vomiting.  Endo/Heme/Allergies:       Negative for polyphagia    PHYSICAL EXAM: Pt in no acute distress  RECENT LABS AND TESTS: BMET    Component Value Date/Time   NA 139 04/17/2019 0935   K 4.6 04/17/2019 0935   CL 99 04/17/2019 0935   CO2 28 04/17/2019 0935   GLUCOSE 88 04/17/2019 0935   GLUCOSE 130 (H) 11/06/2017 0222   BUN 25 04/17/2019 0935   CREATININE 0.67 04/17/2019 0935   CALCIUM 9.3 04/17/2019 0935   GFRNONAA 96 04/17/2019 0935   GFRAA 110 04/17/2019 0935   Lab Results  Component Value Date   HGBA1C 6.0 (H) 04/17/2019   HGBA1C 6.6 (H) 10/08/2018   HGBA1C 6.8 (H) 05/14/2018   Lab Results  Component Value Date   INSULIN 4.2 04/17/2019   INSULIN 6.6 10/08/2018   INSULIN 9.8 05/14/2018   CBC    Component Value Date/Time   WBC 8.0 05/14/2018 0941   WBC 10.1 11/06/2017 0222   RBC 3.97 05/14/2018 0941   RBC 4.31 11/06/2017 0222   HGB 12.0 05/14/2018 0941   HCT 37.4 05/14/2018 0941   PLT 380 11/06/2017 0222   MCV 94 05/14/2018 0941   MCH 30.2 05/14/2018 0941   MCH 29.0 11/06/2017 0222   MCHC 32.1 05/14/2018 0941   MCHC 32.3 11/06/2017  0222   RDW 14.1 05/14/2018 0941   LYMPHSABS 1.9 05/14/2018 0941   MONOABS 0.6 11/06/2017 0222   EOSABS 0.2 05/14/2018 0941   BASOSABS 0.1 05/14/2018 0941   Iron/TIBC/Ferritin/ %Sat No results found for: IRON, TIBC, FERRITIN, IRONPCTSAT Lipid Panel     Component Value Date/Time   CHOL 176 04/17/2019 0935   TRIG 139 04/17/2019 0935   HDL 65 04/17/2019 0935   LDLCALC 87 04/17/2019 0935   Hepatic Function Panel     Component Value Date/Time   PROT 7.2 04/17/2019 0935   ALBUMIN 4.4 04/17/2019 0935   AST 9 04/17/2019 0935   ALT 15 04/17/2019 0935   ALKPHOS 73 04/17/2019 0935   BILITOT 0.4 04/17/2019 0935      Component Value Date/Time   TSH 5.150 (H) 05/14/2018 0941     Ref. Range 04/17/2019 09:35  Vitamin D, 25-Hydroxy Latest Ref Range: 30.0 - 100.0 ng/mL 40.3    I, Doreene Nest, am acting as Location manager for Abby Potash, PA-C I, Abby Potash, PA-C have reviewed above note and agree with its content

## 2019-06-30 ENCOUNTER — Ambulatory Visit (INDEPENDENT_AMBULATORY_CARE_PROVIDER_SITE_OTHER): Payer: Medicare Other | Admitting: Physician Assistant

## 2019-06-30 ENCOUNTER — Encounter (INDEPENDENT_AMBULATORY_CARE_PROVIDER_SITE_OTHER): Payer: Self-pay | Admitting: Physician Assistant

## 2019-06-30 ENCOUNTER — Other Ambulatory Visit: Payer: Self-pay

## 2019-06-30 VITALS — BP 137/70 | HR 86 | Temp 98.2°F | Ht 64.0 in | Wt 267.0 lb

## 2019-06-30 DIAGNOSIS — E559 Vitamin D deficiency, unspecified: Secondary | ICD-10-CM | POA: Diagnosis not present

## 2019-06-30 DIAGNOSIS — E119 Type 2 diabetes mellitus without complications: Secondary | ICD-10-CM | POA: Diagnosis not present

## 2019-06-30 DIAGNOSIS — Z6841 Body Mass Index (BMI) 40.0 and over, adult: Secondary | ICD-10-CM

## 2019-06-30 MED ORDER — VITAMIN D (ERGOCALCIFEROL) 1.25 MG (50000 UNIT) PO CAPS: 50000.0000 [IU] | ORAL_CAPSULE | ORAL | 0 refills | Status: DC

## 2019-07-01 NOTE — Progress Notes (Signed)
Office: [phone]  /  Fax: [phone]   HPI:   Chief Complaint: OBESITY Sally [name] is here to discuss her progress with her obesity treatment plan. She is on the Category 3 plan and is following her eating plan approximately 50 % of the time. She states she is exercising 0 minutes 0 times per week. Sally [name] reports that she has to take a round of steroids due to severe knee pain. She feels like she overate because of the steroids and Thanksgiving.  Her weight is 267 lb (121.1 kg) today and has gained 4 lbs since her last visit. She has lost 14 lbs since starting treatment with [name].  Vitamin D Deficiency Sally [name] has a diagnosis of vitamin D deficiency. She is currently taking prescription Vit D and denies nausea, vomiting or muscle weakness.  Diabetes II Sally [name] has a diagnosis of diabetes type II. Sally [name] is on metformin and denies nausea, vomiting, or diarrhea. She states her fasting BGs range between 118 and 158. She denies polyphagia or hypoglycemia. Last A1c was 6.0. She has been working on intensive lifestyle modifications including diet, exercise, and weight loss to help control her blood glucose levels.  ASSESSMENT AND PLAN:  Vitamin D deficiency - Plan: Vitamin D, Ergocalciferol, (DRISDOL) 1.25 MG (50000 UT) CAPS capsule  Type 2 diabetes mellitus without complication, without long-term current use of insulin (HCC)  Class 3 severe obesity with serious comorbidity and body mass index (BMI) of 45.0 to 49.9 in adult, unspecified obesity type ([place])  PLAN:  Vitamin D Deficiency Sally [name] was informed that low vitamin D levels contributes to fatigue and are associated with obesity, breast, and colon cancer. Sally [name] agrees to continue taking prescription Vit D 50,000 IU every 3 days #10 and we will refill for 1 month. She will follow up for routine testing of vitamin D, at least 2-3 times per year. She was informed of the risk of over-replacement of vitamin D and agrees to not increase her dose  unless she discusses this with [name] first. Sally [name] agrees to follow up with our clinic in 2 weeks.  Diabetes II Sally [name] has been given extensive diabetes education by myself today including ideal fasting and post-prandial blood glucose readings, individual ideal Hgb A1c goals and hypoglycemia prevention. We discussed the importance of good blood sugar control to decrease the likelihood of diabetic complications such as nephropathy, neuropathy, limb loss, blindness, coronary artery disease, and death. We discussed the importance of intensive lifestyle modification including diet, exercise and weight loss as the first line treatment for diabetes. Sally [name] agrees to continue her diabetes medications and weight loss efforts. Sally [name] agrees to follow up with our clinic in 2 weeks.  Obesity Sally [name] is currently in the action stage of change. As such, her goal is to continue with weight loss efforts She has agreed to change to follow a lower carbohydrate, vegetable and lean protein rich diet plan Sally [name] has been instructed to work up to a goal of 150 minutes of combined cardio and strengthening exercise per week for weight loss and overall health benefits. We discussed the following Behavioral Modification Strategies today: work on meal planning and easy cooking plans and keeping healthy foods in the home   Sally [name] has agreed to follow up with our clinic in 2 weeks. She was informed of the importance of frequent follow up visits to maximize her success with intensive lifestyle modifications for her multiple health conditions.  ALLERGIES: No Known Allergies  MEDICATIONS: Current Outpatient Medications on File Prior to Visit  Medication Sig Dispense Refill  . albuterol (PROVENTIL HFA;VENTOLIN HFA) 108 (90 BASE) MCG/ACT inhaler Inhale 2 puffs into the lungs every 6 (six) hours as needed. FOR WHEEZING     . blood glucose meter kit and supplies Dispense based on patient and insurance preference. Use up to four  times daily as directed. (FOR ICD-10 E10.9, E11.9). Test twice daily 1 each 0  . cyclobenzaprine (FLEXERIL) 10 MG tablet Take 10 mg by mouth 3 (three) times daily as needed for muscle spasms.    . DULoxetine (CYMBALTA) 30 MG capsule Take 30 mg by mouth 3 (three) times daily.    . Etanercept (ENBREL [place]) Inject into the skin.    . furosemide (LASIX) 20 MG tablet Take 20 mg by mouth daily as needed.    . gabapentin (NEURONTIN) 300 MG capsule Take 300 mg by mouth 2 (two) times daily.   0  . meloxicam (MOBIC) 15 MG tablet Take 15 mg by mouth at bedtime.  3  . metFORMIN (GLUCOPHAGE-XR) 500 MG 24 hr tablet Take 1 tablet (500 mg total) by mouth 2 (two) times daily. 180 tablet 0  . methotrexate (RHEUMATREX) 2.5 MG tablet Take 2.5 mg by mouth once a week. Take 8 tablets by mouth once a week on Friday.  3  . montelukast (SINGULAIR) 10 MG tablet Take 10 mg by mouth at bedtime.    . Multiple Vitamins-Minerals (HAIR SKIN & NAILS ADVANCED PO) Take 2 tablets by mouth 2 (two) times daily.    . mupirocin ointment (BACTROBAN) 2 % Place 1 application into the nose 2 (two) times daily as needed.    [name] omeprazole (PRILOSEC) 20 MG capsule Take 20 mg by mouth daily.    . predniSONE (DELTASONE) 10 MG tablet Take 10 mg by mouth daily with breakfast.    . traMADol (ULTRAM) 50 MG tablet Take 50 mg by mouth 2 (two) times daily as needed for moderate pain.      No current facility-administered medications on file prior to visit.     PAST MEDICAL HISTORY: Past Medical History:  Diagnosis Date  . ADD (attention deficit disorder)   . Anemia   . Anxiety   . Arthritis   . Asthma   . Back pain   . CFS (chronic fatigue syndrome)   . Colitis   . Depression   . Diabetes ([place])   . Dyspnea   . Fibromyalgia   . GERD (gastroesophageal reflux disease)   . Hx of blood clots   . IBS (irritable bowel syndrome)   . Joint pain   . Left knee pain   . Leg edema   . Osteoarthritis   . Prediabetes   . Pulmonary embolism ([place])    . RA (rheumatoid arthritis) ([place])   . RA (rheumatoid arthritis) ([place])   . RLS (restless legs syndrome)   . Sleep apnea   . Vitamin B 12 deficiency   . Vitamin D deficiency     PAST SURGICAL HISTORY: Past Surgical History:  Procedure Laterality Date  . EYE SURGERY    . MOUTH SURGERY      SOCIAL HISTORY: Social History   Tobacco Use  . Smoking status: Former Smoker    Packs/day: 1.50    Years: 8.00    Pack years: 12.00  . Smokeless tobacco: Never Used  Substance Use Topics  . Alcohol use: No    Alcohol/week: 0.0 standard drinks  . Drug use: No    FAMILY HISTORY: Family History  Problem  Relation Age of Onset  . Pneumonia Mother   . Asthma Mother   . Diabetes Mother   . Hypertension Mother   . Depression Mother   . Anxiety disorder Mother   . Eating disorder Mother   . Obesity Mother   . Sudden death Mother   . Cancer Father        Brain tumor  . Asthma Father     ROS: Review of Systems  Constitutional: Negative for weight loss.  Gastrointestinal: Negative for diarrhea, nausea and vomiting.  Musculoskeletal:       Negative muscle weakness  Endo/Heme/Allergies:       Negative polyphagia Negative hypoglycemia    PHYSICAL EXAM: Blood pressure 137/70, pulse 86, temperature 98.2 F (36.8 C), temperature source Oral, height 5' 4"  (1.626 m), weight 267 lb (121.1 kg), last menstrual period 05/31/2012, SpO2 99 %. Body mass index is 45.83 kg/m. Physical Exam Vitals signs reviewed.  Constitutional:      Appearance: Normal appearance. She is obese.  Cardiovascular:     Rate and Rhythm: Normal rate.     Pulses: Normal pulses.  Pulmonary:     Effort: Pulmonary effort is normal.     Breath sounds: Normal breath sounds.  Musculoskeletal: Normal range of motion.  Skin:    General: Skin is warm and dry.  Neurological:     Mental Status: She is alert and oriented to person, place, and time.  Psychiatric:        Mood and Affect: Mood normal.        Behavior:  Behavior normal.     RECENT LABS AND TESTS: BMET    Component Value Date/Time   NA 139 04/17/2019 0935   K 4.6 04/17/2019 0935   CL 99 04/17/2019 0935   CO2 28 04/17/2019 0935   GLUCOSE 88 04/17/2019 0935   GLUCOSE 130 (H) 11/06/2017 0222   BUN 25 04/17/2019 0935   CREATININE 0.67 04/17/2019 0935   CALCIUM 9.3 04/17/2019 0935   GFRNONAA 96 04/17/2019 0935   GFRAA 110 04/17/2019 0935   Lab Results  Component Value Date   HGBA1C 6.0 (H) 04/17/2019   HGBA1C 6.6 (H) 10/08/2018   HGBA1C 6.8 (H) 05/14/2018   Lab Results  Component Value Date   INSULIN 4.2 04/17/2019   INSULIN 6.6 10/08/2018   INSULIN 9.8 05/14/2018   CBC    Component Value Date/Time   WBC 8.0 05/14/2018 0941   WBC 10.1 11/06/2017 0222   RBC 3.97 05/14/2018 0941   RBC 4.31 11/06/2017 0222   HGB 12.0 05/14/2018 0941   HCT 37.4 05/14/2018 0941   PLT 380 11/06/2017 0222   MCV 94 05/14/2018 0941   MCH 30.2 05/14/2018 0941   MCH 29.0 11/06/2017 0222   MCHC 32.1 05/14/2018 0941   MCHC 32.3 11/06/2017 0222   RDW 14.1 05/14/2018 0941   LYMPHSABS 1.9 05/14/2018 0941   MONOABS 0.6 11/06/2017 0222   EOSABS 0.2 05/14/2018 0941   BASOSABS 0.1 05/14/2018 0941   Iron/TIBC/Ferritin/ %Sat No results found for: IRON, TIBC, FERRITIN, IRONPCTSAT Lipid Panel     Component Value Date/Time   CHOL 176 04/17/2019 0935   TRIG 139 04/17/2019 0935   HDL 65 04/17/2019 0935   LDLCALC 87 04/17/2019 0935   Hepatic Function Panel     Component Value Date/Time   PROT 7.2 04/17/2019 0935   ALBUMIN 4.4 04/17/2019 0935   AST 9 04/17/2019 0935   ALT 15 04/17/2019 0935   ALKPHOS 73 04/17/2019 0935  BILITOT 0.4 04/17/2019 0935      Component Value Date/Time   TSH 5.150 (H) 05/14/2018 0941      OBESITY BEHAVIORAL INTERVENTION VISIT  Today's visit was # 24   Starting weight: 281 lbs Starting date: 05/14/18 Today's weight : 267 lbs Today's date: 06/30/2019 Total lbs lost to date: 14 At least 15 minutes were  spent on discussing the following behavioral intervention visit.   ASK: We discussed the diagnosis of obesity with Sally [name] today and Sally [name] agreed to give [name] permission to discuss obesity behavioral modification therapy today.  ASSESS: Sally [name] has the diagnosis of obesity and her BMI today is 45.81 Sally [name] is in the action stage of change   ADVISE: Sulma was educated on the multiple health risks of obesity as well as the benefit of weight loss to improve her health. She was advised of the need for long term treatment and the importance of lifestyle modifications to improve her current health and to decrease her risk of future health problems.  AGREE: Multiple dietary modification options and treatment options were discussed and  Sally [name] agreed to follow the recommendations documented in the above note.  ARRANGE: Sally [name] was educated on the importance of frequent visits to treat obesity as outlined per CMS and USPSTF guidelines and agreed to schedule her next follow up appointment today.  Sally [name], am acting as transcriptionist for Abby Potash, PA-C I, Abby Potash, PA-C have reviewed above note and agree with its content

## 2019-07-09 ENCOUNTER — Encounter (INDEPENDENT_AMBULATORY_CARE_PROVIDER_SITE_OTHER): Payer: Self-pay

## 2019-07-16 ENCOUNTER — Telehealth (INDEPENDENT_AMBULATORY_CARE_PROVIDER_SITE_OTHER): Payer: Medicare Other | Admitting: Physician Assistant

## 2019-07-16 ENCOUNTER — Encounter (INDEPENDENT_AMBULATORY_CARE_PROVIDER_SITE_OTHER): Payer: Self-pay | Admitting: Physician Assistant

## 2019-07-16 ENCOUNTER — Other Ambulatory Visit: Payer: Self-pay

## 2019-07-16 DIAGNOSIS — Z6841 Body Mass Index (BMI) 40.0 and over, adult: Secondary | ICD-10-CM

## 2019-07-16 DIAGNOSIS — E559 Vitamin D deficiency, unspecified: Secondary | ICD-10-CM

## 2019-07-16 DIAGNOSIS — E1165 Type 2 diabetes mellitus with hyperglycemia: Secondary | ICD-10-CM | POA: Diagnosis not present

## 2019-07-16 MED ORDER — VITAMIN D (ERGOCALCIFEROL) 1.25 MG (50000 UNIT) PO CAPS: 50000.0000 [IU] | ORAL_CAPSULE | ORAL | 0 refills | Status: DC

## 2019-07-21 NOTE — Progress Notes (Signed)
Office: [phone]  /  Fax: [phone] TeleHealth Visit:  SEAN MACWILLIAMS has verbally consented to this TeleHealth visit today. The patient is located in her car, the provider is located at the [facility] office. The participants in this visit include the listed provider and patient and any and all parties involved. The visit was conducted today via FaceTime.  HPI:  Chief Complaint: OBESITY Clydean is here to discuss her progress with her obesity treatment plan. She is on the lower carbohydrate, vegetable and lean protein rich diet plan and states she is following her eating plan approximately 10 % of the time. She states she is exercising 0 minutes 0 times per week. (weight not reported)  Jeanie states that she has done "terrible" with the plan and she has had trouble getting started on low carb. She is not planning her meals.   Diabetes II Mckenzie has a diagnosis of diabetes type II. She is on metformin. Conchetta denies nausea, vomiting or diarrhea. She has no polyphagia or hypoglycemia.      Vitamin D deficiency Dealva has a diagnosis of vitamin D deficiency. She has not received the last prescription that was sent in and ran out of vitamin D last week. She is going to check with her pharmacy. Brizeida has no nausea, vomiting or muscle weakness on vitamin D.      ASSESSMENT AND PLAN:  Vitamin D deficiency - Plan: Vitamin D, Ergocalciferol, (DRISDOL) 1.25 MG (50000 UT) CAPS capsule  Class 3 severe obesity with serious comorbidity and body mass index (BMI) of 40.0 to 44.9 in adult, unspecified obesity type ([place])  Type 2 diabetes mellitus with hyperglycemia, without long-term current use of insulin ([place])  PLAN:  Diabetes II Kylii has been given diabetes education by myself today. Good blood sugar control is important to decrease the likelihood of diabetic complications such as nephropathy, neuropathy, limb loss, blindness, coronary artery disease, and death. Intensive  lifestyle modification including diet, exercise and weight loss were discussed as the first line treatment for diabetes. Nubia will continue with metformin and weight loss.  Vitamin D Deficiency Low vitamin D level contributes to fatigue and are associated with obesity, breast, and colon cancer. Teira will continue with vitamin D @50 ,000 IU every 3 days and she will follow up for routine testing of vitamin D, at least 2-3 times per year to avoid over-replacement. Vitamin D prescription was re-sent today. Jolene agrees to follow up as directed.  Obesity Laurana is currently in the action stage of change. As such, her goal is to continue with weight loss efforts She has agreed to follow a lower carbohydrate, vegetable and lean protein rich diet plan Avya has been instructed to work up to a goal of 150 minutes of combined cardio and strengthening exercise per week for weight loss and overall health benefits. We discussed the following Behavioral Modification Strategies today: planning for success and no skipping meals  Zofia has agreed to follow up with our clinic in 3 weeks. She was informed of the importance of frequent follow up visits to maximize her success with intensive lifestyle modifications for her multiple health conditions.  I spent > than 50% of the 28 minute visit on counseling as documented in the note.    ALLERGIES: No Known Allergies  MEDICATIONS: Current Outpatient Medications on File Prior to Visit  Medication Sig Dispense Refill  . albuterol (PROVENTIL HFA;VENTOLIN HFA) 108 (90 BASE) MCG/ACT inhaler Inhale 2 puffs into the lungs every 6 (six)  hours as needed. FOR WHEEZING     . blood glucose meter kit and supplies Dispense based on patient and insurance preference. Use up to four times daily as directed. (FOR ICD-10 E10.9, E11.9). Test twice daily 1 each 0  . cyclobenzaprine (FLEXERIL) 10 MG tablet Take 10 mg by mouth 3 (three) times daily as needed for muscle spasms.     . DULoxetine (CYMBALTA) 30 MG capsule Take 30 mg by mouth 3 (three) times daily.    . Etanercept (ENBREL [place]) Inject into the skin.    . furosemide (LASIX) 20 MG tablet Take 20 mg by mouth daily as needed.    . gabapentin (NEURONTIN) 300 MG capsule Take 300 mg by mouth 2 (two) times daily.   0  . meloxicam (MOBIC) 15 MG tablet Take 15 mg by mouth at bedtime.  3  . metFORMIN (GLUCOPHAGE-XR) 500 MG 24 hr tablet Take 1 tablet (500 mg total) by mouth 2 (two) times daily. 180 tablet 0  . methotrexate (RHEUMATREX) 2.5 MG tablet Take 2.5 mg by mouth once a week. Take 8 tablets by mouth once a week on Friday.  3  . montelukast (SINGULAIR) 10 MG tablet Take 10 mg by mouth at bedtime.    . Multiple Vitamins-Minerals (HAIR SKIN & NAILS ADVANCED PO) Take 2 tablets by mouth 2 (two) times daily.    . mupirocin ointment (BACTROBAN) 2 % Place 1 application into the nose 2 (two) times daily as needed.    [name] omeprazole (PRILOSEC) 20 MG capsule Take 20 mg by mouth daily.    . predniSONE (DELTASONE) 10 MG tablet Take 10 mg by mouth daily with breakfast.    . traMADol (ULTRAM) 50 MG tablet Take 50 mg by mouth 2 (two) times daily as needed for moderate pain.      No current facility-administered medications on file prior to visit.    PAST MEDICAL HISTORY: Past Medical History:  Diagnosis Date  . ADD (attention deficit disorder)   . Anemia   . Anxiety   . Arthritis   . Asthma   . Back pain   . CFS (chronic fatigue syndrome)   . Colitis   . Depression   . Diabetes ([place])   . Dyspnea   . Fibromyalgia   . GERD (gastroesophageal reflux disease)   . Hx of blood clots   . IBS (irritable bowel syndrome)   . Joint pain   . Left knee pain   . Leg edema   . Osteoarthritis   . Prediabetes   . Pulmonary embolism ([place])   . RA (rheumatoid arthritis) ([place])   . RA (rheumatoid arthritis) ([place])   . RLS (restless legs syndrome)   . Sleep apnea   . Vitamin B 12 deficiency   . Vitamin D deficiency     PAST  SURGICAL HISTORY: Past Surgical History:  Procedure Laterality Date  . EYE SURGERY    . MOUTH SURGERY      SOCIAL HISTORY: Social History   Tobacco Use  . Smoking status: Former Smoker    Packs/day: 1.50    Years: 8.00    Pack years: 12.00  . Smokeless tobacco: Never Used  Substance Use Topics  . Alcohol use: No    Alcohol/week: 0.0 standard drinks  . Drug use: No    FAMILY HISTORY: Family History  Problem Relation Age of Onset  . Pneumonia Mother   . Asthma Mother   . Diabetes Mother   . Hypertension Mother   .  Depression Mother   . Anxiety disorder Mother   . Eating disorder Mother   . Obesity Mother   . Sudden death Mother   . Cancer Father        Brain tumor  . Asthma Father     ROS: Review of Systems  Constitutional: Negative for weight loss.  Gastrointestinal: Negative for diarrhea, nausea and vomiting.  Musculoskeletal:       Negative for muscle weakness  Endo/Heme/Allergies:       Negative for polyphagia Negative for hypoglycemia    PHYSICAL EXAM: Last menstrual period 05/31/2012. There is no height or weight on file to calculate BMI. Physical Exam Vitals reviewed.  Constitutional:      General: She is not in acute distress.    Appearance: Normal appearance. She is well-developed. She is obese.  Cardiovascular:     Rate and Rhythm: Normal rate.  Pulmonary:     Effort: Pulmonary effort is normal.  Musculoskeletal:        General: Normal range of motion.  Skin:    General: Skin is warm and dry.  Neurological:     Mental Status: She is alert and oriented to person, place, and time.  Psychiatric:        Mood and Affect: Mood normal.        Behavior: Behavior normal.     RECENT LABS AND TESTS: BMET    Component Value Date/Time   NA 139 04/17/2019 0935   K 4.6 04/17/2019 0935   CL 99 04/17/2019 0935   CO2 28 04/17/2019 0935   GLUCOSE 88 04/17/2019 0935   GLUCOSE 130 (H) 11/06/2017 0222   BUN 25 04/17/2019 0935   CREATININE 0.67  04/17/2019 0935   CALCIUM 9.3 04/17/2019 0935   GFRNONAA 96 04/17/2019 0935   GFRAA 110 04/17/2019 0935   Lab Results  Component Value Date   HGBA1C 6.0 (H) 04/17/2019   HGBA1C 6.6 (H) 10/08/2018   HGBA1C 6.8 (H) 05/14/2018   Lab Results  Component Value Date   INSULIN 4.2 04/17/2019   INSULIN 6.6 10/08/2018   INSULIN 9.8 05/14/2018   CBC    Component Value Date/Time   WBC 8.0 05/14/2018 0941   WBC 10.1 11/06/2017 0222   RBC 3.97 05/14/2018 0941   RBC 4.31 11/06/2017 0222   HGB 12.0 05/14/2018 0941   HCT 37.4 05/14/2018 0941   PLT 380 11/06/2017 0222   MCV 94 05/14/2018 0941   MCH 30.2 05/14/2018 0941   MCH 29.0 11/06/2017 0222   MCHC 32.1 05/14/2018 0941   MCHC 32.3 11/06/2017 0222   RDW 14.1 05/14/2018 0941   LYMPHSABS 1.9 05/14/2018 0941   MONOABS 0.6 11/06/2017 0222   EOSABS 0.2 05/14/2018 0941   BASOSABS 0.1 05/14/2018 0941   Iron/TIBC/Ferritin/ %Sat No results found for: IRON, TIBC, FERRITIN, IRONPCTSAT Lipid Panel     Component Value Date/Time   CHOL 176 04/17/2019 0935   TRIG 139 04/17/2019 0935   HDL 65 04/17/2019 0935   LDLCALC 87 04/17/2019 0935   Hepatic Function Panel     Component Value Date/Time   PROT 7.2 04/17/2019 0935   ALBUMIN 4.4 04/17/2019 0935   AST 9 04/17/2019 0935   ALT 15 04/17/2019 0935   ALKPHOS 73 04/17/2019 0935   BILITOT 0.4 04/17/2019 0935      Component Value Date/Time   TSH 5.150 (H) 05/14/2018 0941     Ref. Range 04/17/2019 09:35  Vitamin D, 25-Hydroxy Latest Ref Range: 30.0 - 100.0 ng/mL  40.3    I, Doreene Nest, am acting as transcriptionist for Abby Potash, PA-C I, Abby Potash, PA-C have reviewed above note and agree with its content

## 2019-08-07 ENCOUNTER — Other Ambulatory Visit: Payer: Self-pay

## 2019-08-07 ENCOUNTER — Ambulatory Visit (INDEPENDENT_AMBULATORY_CARE_PROVIDER_SITE_OTHER): Payer: Medicare Other | Admitting: Physician Assistant

## 2019-08-07 VITALS — BP 147/81 | HR 91 | Temp 97.9°F | Ht 64.0 in | Wt 268.0 lb

## 2019-08-07 DIAGNOSIS — Z6841 Body Mass Index (BMI) 40.0 and over, adult: Secondary | ICD-10-CM | POA: Diagnosis not present

## 2019-08-07 DIAGNOSIS — E7849 Other hyperlipidemia: Secondary | ICD-10-CM

## 2019-08-07 DIAGNOSIS — E559 Vitamin D deficiency, unspecified: Secondary | ICD-10-CM | POA: Diagnosis not present

## 2019-08-07 MED ORDER — VITAMIN D (ERGOCALCIFEROL) 1.25 MG (50000 UNIT) PO CAPS: 50000.0000 [IU] | ORAL_CAPSULE | ORAL | 0 refills | Status: DC

## 2019-08-11 ENCOUNTER — Other Ambulatory Visit (INDEPENDENT_AMBULATORY_CARE_PROVIDER_SITE_OTHER): Payer: Self-pay | Admitting: Physician Assistant

## 2019-08-11 DIAGNOSIS — E559 Vitamin D deficiency, unspecified: Secondary | ICD-10-CM

## 2019-08-14 NOTE — Progress Notes (Signed)
Chief Complaint:   OBESITY Sally [name] is here to discuss her progress with her obesity treatment plan along with follow-up of her obesity related diagnoses. Sally [name] is following a lower carbohydrate, vegetable and lean protein rich diet plan and states she is following her eating plan approximately 0% of the time. Sally [name] states she is exercising 0 minutes 0 times per week.  Today's visit was #: 25 Starting weight: 281 lbs Starting date: 05/14/2018 Today's weight: 268 lbs Today's date: 08/07/2019 Total lbs lost to date: 13 Total lbs lost since last in-office visit: 0  Interim History: Sally [name] reports that during the holiday she did not do the low carb plan. She does report a little bit of a depressed mood over the winter. She is on Cymbalta for both depression and fibromyalgia.  Subjective:   Vitamin D deficiency. Last Vitamin D level 40.3 on 04/17/2019. She is on Vitamin D every 3 days. No nausea, vomiting, or muscle weakness.  Other hyperlipidemia. Alli has hyperlipidemia and has been trying to improve her cholesterol levels with intensive lifestyle modification including a low saturated fat diet, exercise and weight loss. She denies any chest pain. She is on no medications. Last lipid panel normal.  Lab Results  Component Value Date   ALT 15 04/17/2019   AST 9 04/17/2019   ALKPHOS 73 04/17/2019   BILITOT 0.4 04/17/2019   Lab Results  Component Value Date   CHOL 176 04/17/2019   HDL 65 04/17/2019   LDLCALC 87 04/17/2019   TRIG 139 04/17/2019   Assessment/Plan:   Vitamin D deficiency. Low Vitamin D level contributes to fatigue and are associated with obesity, breast, and colon cancer. She agrees to continue to take prescription Vitamin D, Ergocalciferol, (DRISDOL) 1.25 MG (50000 UT) CAPS capsule every 3 days #30 with 0 refills (3 month supply > [facility]) and will follow-up for routine testing of Vitamin D, at least 2-3 times per year to avoid over-replacement. Will  monitor.   Other hyperlipidemia. Cardiovascular risk and specific lipid/LDL goals reviewed.  We discussed several lifestyle modifications today and Bowen will continue to work on diet, exercise and weight loss efforts. Orders and follow up as documented in patient record. Will monitor.  Counseling Intensive lifestyle modifications are the first line treatment for this issue. . Dietary changes: Increase soluble fiber. Decrease simple carbohydrates. . Exercise changes: Moderate to vigorous-intensity aerobic activity 150 minutes per week if tolerated. . Lipid-lowering medications: see documented in medical record.  Class 3 severe obesity with serious comorbidity and body mass index (BMI) of 45.0 to 49.9 in adult, unspecified obesity type ([place]).  Sally [name] is currently in the action stage of change. As such, her goal is to continue with weight loss efforts. She has agreed to following a lower carbohydrate, vegetable and lean protein rich diet plan.   Sally [name] is to speak with her PCP regarding her Cymbalta.  We discussed the following exercise goals today: For substantial health benefits, adults should do at least 150 minutes (2 hours and 30 minutes) a week of moderate-intensity, or 75 minutes (1 hour and 15 minutes) a week of vigorous-intensity aerobic physical activity, or an equivalent combination of moderate- and vigorous-intensity aerobic activity. Aerobic activity should be performed in episodes of at least 10 minutes, and preferably, it should be spread throughout the week. Adults should also include muscle-strengthening activities that involve all major muscle groups on 2 or more days a week.  We discussed the following behavioral modification strategies today: meal  planning and cooking strategies and keeping healthy foods in the home.  Sally [name] has agreed to follow-up with our clinic in 2 weeks. She was informed of the importance of frequent follow-up visits to maximize her success with  intensive lifestyle modifications for her multiple health conditions.   Objective:   Blood pressure (!) 147/81, pulse 91, temperature 97.9 F (36.6 C), temperature source Oral, height 5\' 4"  (1.626 m), weight 268 lb (121.6 kg), last menstrual period 05/31/2012, SpO2 97 %. Body mass index is 46 kg/m.  General: Cooperative, alert, well developed, in no acute distress. HEENT: Conjunctivae and lids unremarkable. Neck: No thyromegaly.  Cardiovascular: Regular rhythm.  Lungs: Normal work of breathing. Extremities: No edema.  Neurologic: No focal deficits.   Lab Results  Component Value Date   CREATININE 0.67 04/17/2019   BUN 25 04/17/2019   NA 139 04/17/2019   K 4.6 04/17/2019   CL 99 04/17/2019   CO2 28 04/17/2019   Lab Results  Component Value Date   ALT 15 04/17/2019   AST 9 04/17/2019   ALKPHOS 73 04/17/2019   BILITOT 0.4 04/17/2019   Lab Results  Component Value Date   HGBA1C 6.0 (H) 04/17/2019   HGBA1C 6.6 (H) 10/08/2018   HGBA1C 6.8 (H) 05/14/2018   Lab Results  Component Value Date   INSULIN 4.2 04/17/2019   INSULIN 6.6 10/08/2018   INSULIN 9.8 05/14/2018   Lab Results  Component Value Date   TSH 5.150 (H) 05/14/2018   Lab Results  Component Value Date   CHOL 176 04/17/2019   HDL 65 04/17/2019   LDLCALC 87 04/17/2019   TRIG 139 04/17/2019   Lab Results  Component Value Date   WBC 8.0 05/14/2018   HGB 12.0 05/14/2018   HCT 37.4 05/14/2018   MCV 94 05/14/2018   PLT 380 11/06/2017   No results found for: IRON, TIBC, FERRITIN  Obesity Behavioral Intervention Documentation for Insurance:   Approximately 15 minutes were spent on the discussion below.  ASK: We discussed the diagnosis of obesity with 01/06/2018 today and Kerra agreed to give Sally [name] permission to discuss obesity behavioral modification therapy today.  ASSESS: Humna has the diagnosis of obesity and her BMI today is 46.0. Sally [name] is in the action stage of change.   ADVISE: Sally [name] was  educated on the multiple health risks of obesity as well as the benefit of weight loss to improve her health. She was advised of the need for long term treatment and the importance of lifestyle modifications to improve her current health and to decrease her risk of future health problems.  AGREE: Multiple dietary modification options and treatment options were discussed and Sally [name] agreed to follow the recommendations documented in the above note.  ARRANGE: Tiea was educated on the importance of frequent visits to treat obesity as outlined per CMS and USPSTF guidelines and agreed to schedule her next follow up appointment today.  Attestation Statements:   Reviewed by clinician on day of visit: allergies, medications, problem list, medical history, surgical history, family history, social history, and previous encounter notes.  IMarchelle [name], am acting as transcriptionist for Marianna Payment, PA-C   I have reviewed the above documentation for accuracy and completeness, and I agree with the above. Alois Cliche, PA-C

## 2019-08-27 ENCOUNTER — Ambulatory Visit (INDEPENDENT_AMBULATORY_CARE_PROVIDER_SITE_OTHER): Payer: Medicare Other | Admitting: Physician Assistant

## 2019-09-02 ENCOUNTER — Encounter (INDEPENDENT_AMBULATORY_CARE_PROVIDER_SITE_OTHER): Payer: Self-pay | Admitting: Family Medicine

## 2019-09-02 ENCOUNTER — Other Ambulatory Visit: Payer: Self-pay

## 2019-09-02 ENCOUNTER — Ambulatory Visit (INDEPENDENT_AMBULATORY_CARE_PROVIDER_SITE_OTHER): Payer: Medicare Other | Admitting: Family Medicine

## 2019-09-02 VITALS — BP 125/82 | HR 82 | Temp 97.8°F | Ht 64.0 in | Wt 265.0 lb

## 2019-09-02 DIAGNOSIS — E1165 Type 2 diabetes mellitus with hyperglycemia: Secondary | ICD-10-CM

## 2019-09-02 DIAGNOSIS — Z6841 Body Mass Index (BMI) 40.0 and over, adult: Secondary | ICD-10-CM

## 2019-09-02 DIAGNOSIS — E559 Vitamin D deficiency, unspecified: Secondary | ICD-10-CM

## 2019-09-02 DIAGNOSIS — R7989 Other specified abnormal findings of blood chemistry: Secondary | ICD-10-CM | POA: Diagnosis not present

## 2019-09-02 MED ORDER — VITAMIN D (ERGOCALCIFEROL) 1.25 MG (50000 UNIT) PO CAPS: 50000.0000 [IU] | ORAL_CAPSULE | ORAL | 0 refills | Status: DC

## 2019-09-02 NOTE — Progress Notes (Signed)
Chief Complaint:   OBESITY Sally [name] is here to discuss her progress with her obesity treatment plan along with follow-up of her obesity related diagnoses. Sally [name] is on following a lower carbohydrate, vegetable and lean protein rich diet plan and states she is following her eating plan approximately 0% of the time. Sally [name] states she is exercising 0 minutes 0 times per week.  Today's visit was #: 27 Starting weight: 281 lbs Starting date: 05/14/2018 Today's weight: 265 lbs Today's date: 09/02/2019 Total lbs lost to date: 16 Total lbs lost since last in-office visit: 3  Interim History: Sally [name]'s experience so far has been positive. She was on the Category 3 plan initially and switched to the low carb plan, but she hasn't been to the grocery store. She has been craving more recently. Sally [name] is remodeling her house, so she doesn't have a stove available.  Subjective:   Vitamin D deficiency  Sally [name]'s Vitamin D level was 40.3 on 04/17/19. She is currently taking prescription vit D. She admits fatigue and denies nausea, vomiting or muscle weakness. Labs were discussed with patient today.  Type 2 diabetes mellitus with hyperglycemia, without long-term current use of insulin (HCC)  Sally [name] is on metformin. Her last Hgb A1c was 6.0 and she has insulin level of 4.2 (04/17/19). She is not checking her blood sugars regularly. Labs were discussed with patient today.  Lab Results  Component Value Date   HGBA1C 6.0 (H) 04/17/2019   HGBA1C 6.6 (H) 10/08/2018   HGBA1C 6.8 (H) 05/14/2018   Lab Results  Component Value Date   LDLCALC 87 04/17/2019   CREATININE 0.67 04/17/2019   Lab Results  Component Value Date   INSULIN 4.2 04/17/2019   INSULIN 6.6 10/08/2018   INSULIN 9.8 05/14/2018   Abnormal thyroid blood test  Sally [name]'s TSH was previously at 5.150 and she has not been tested since 05/14/2018. She is not on medications. Sally [name] admits fatigue and she denies palpitations. She denies  cold or heat intolerance.  Assessment/Plan:   Vitamin D deficiency Low Vitamin D level contributes to fatigue and are associated with obesity, breast, and colon cancer. Sally [name] agrees to continue to take prescription Vitamin D @50 ,000 IU every week #4 with no refills and we will check vitamin D level today. She  will follow-up for routine testing of Vitamin D, at least 2-3 times per year to avoid over-replacement.  Type 2 diabetes mellitus with hyperglycemia, without long-term current use of insulin (HCC)  Good blood sugar control is important to decrease the likelihood of diabetic complications such as nephropathy, neuropathy, limb loss, blindness, coronary artery disease, and death. Intensive lifestyle modification including diet, exercise and weight loss are the first line of treatment for diabetes. We will check Hgb A1c, insulin level, CMP and fasting lipid panel today.  Abnormal thyroid blood test  We will check TSH today.  Class 3 severe obesity with serious comorbidity and body mass index (BMI) of 45.0 to 49.9 in adult, unspecified obesity type (HCC) Sally [name] is currently in the action stage of change. As such, her goal is to continue with weight loss efforts. She has agreed to the Category 3 Plan.   Patient would like to increase in water consumption at least 4 times per week for the next 2 weeks and limit her sweet consumption to once a day, instead of repeated indulgences throughout the day.  Exercise goals: Sally [name] is to plan to start exercising when her fibromyalgia is better controlled.  Behavioral modification  strategies: increasing lean protein intake, increasing vegetables, meal planning and cooking strategies and keeping healthy foods in the home.  Sally [name] has agreed to follow-up with our clinic in 2 weeks. She was informed of the importance of frequent follow-up visits to maximize her success with intensive lifestyle modifications for her multiple health conditions.   Sally [name] was  informed we would discuss her lab results at her next visit unless there is a critical issue that needs to be addressed sooner. Sally [name] agreed to keep her next visit at the agreed upon time to discuss these results.  Objective:   Blood pressure 125/82, pulse 82, temperature 97.8 F (36.6 C), temperature source Oral, height 5\' 4"  (1.626 m), weight 265 lb (120.2 kg), last menstrual period 05/31/2012, SpO2 98 %. Body mass index is 45.49 kg/m.  General: Cooperative, alert, well developed, in no acute distress. HEENT: Conjunctivae and lids unremarkable. Cardiovascular: Regular rhythm.  Lungs: Normal work of breathing. Neurologic: No focal deficits.   Lab Results  Component Value Date   CREATININE 0.67 04/17/2019   BUN 25 04/17/2019   NA 139 04/17/2019   K 4.6 04/17/2019   CL 99 04/17/2019   CO2 28 04/17/2019   Lab Results  Component Value Date   ALT 15 04/17/2019   AST 9 04/17/2019   ALKPHOS 73 04/17/2019   BILITOT 0.4 04/17/2019   Lab Results  Component Value Date   HGBA1C 6.0 (H) 04/17/2019   HGBA1C 6.6 (H) 10/08/2018   HGBA1C 6.8 (H) 05/14/2018   Lab Results  Component Value Date   INSULIN 4.2 04/17/2019   INSULIN 6.6 10/08/2018   INSULIN 9.8 05/14/2018   Lab Results  Component Value Date   TSH 5.150 (H) 05/14/2018   Lab Results  Component Value Date   CHOL 176 04/17/2019   HDL 65 04/17/2019   LDLCALC 87 04/17/2019   TRIG 139 04/17/2019   Lab Results  Component Value Date   WBC 8.0 05/14/2018   HGB 12.0 05/14/2018   HCT 37.4 05/14/2018   MCV 94 05/14/2018   PLT 380 11/06/2017   No results found for: IRON, TIBC, FERRITIN   Ref. Range 04/17/2019 09:35  Vitamin D, 25-Hydroxy Latest Ref Range: 30.0 - 100.0 ng/mL 40.3   Obesity Behavioral Intervention Documentation for Insurance:   Approximately 15 minutes were spent on the discussion below.  ASK: We discussed the diagnosis of obesity with Sally [name] today and Sally [name] agreed to give [name] permission to discuss  obesity behavioral modification therapy today.  ASSESS: Sally [name] has the diagnosis of obesity and her BMI today is 45.46. Sally [name] is in the action stage of change.   ADVISE: Sally [name] was educated on the multiple health risks of obesity as well as the benefit of weight loss to improve her health. She was advised of the need for long term treatment and the importance of lifestyle modifications to improve her current health and to decrease her risk of future health problems.  AGREE: Multiple dietary modification options and treatment options were discussed and Sally [name] agreed to follow the recommendations documented in the above note.  ARRANGE: Sally [name] was educated on the importance of frequent visits to treat obesity as outlined per CMS and USPSTF guidelines and agreed to schedule her next follow up appointment today.  Attestation Statements:   Reviewed by clinician on day of visit: allergies, medications, problem list, medical history, surgical history, family history, social history, and previous encounter notes.  Corey Skains, am acting as transcriptionist for Eber Jones, MD.  I have reviewed the above documentation for accuracy and completeness, and I agree with the above. - Ilene Qua, MD

## 2019-09-03 LAB — COMPREHENSIVE METABOLIC PANEL
ALT: 19 IU/L (ref 0–32)
AST: 15 IU/L (ref 0–40)
Albumin/Globulin Ratio: 1.6 (ref 1.2–2.2)
Albumin: 4.3 g/dL (ref 3.8–4.9)
Alkaline Phosphatase: 87 IU/L (ref 39–117)
BUN/Creatinine Ratio: 25 (ref 12–28)
BUN: 15 mg/dL (ref 8–27)
Bilirubin Total: 0.3 mg/dL (ref 0.0–1.2)
CO2: 27 mmol/L (ref 20–29)
Calcium: 9.4 mg/dL (ref 8.7–10.3)
Chloride: 102 mmol/L (ref 96–106)
Creatinine, Ser: 0.61 mg/dL (ref 0.57–1.00)
GFR calc Af Amer: 114 mL/min/{1.73_m2} (ref >59)
GFR calc non Af Amer: 99 mL/min/{1.73_m2} (ref >59)
Globulin, Total: 2.7 g/dL (ref 1.5–4.5)
Glucose: 104 mg/dL — ABNORMAL HIGH (ref 65–99)
Potassium: 5.1 mmol/L (ref 3.5–5.2)
Sodium: 141 mmol/L (ref 134–144)
Total Protein: 7 g/dL (ref 6.0–8.5)

## 2019-09-03 LAB — TSH: TSH: 3.19 u[IU]/mL (ref 0.450–4.500)

## 2019-09-03 LAB — HEMOGLOBIN A1C
Est. average glucose Bld gHb Est-mCnc: 137 mg/dL
Hgb A1c MFr Bld: 6.4 % — ABNORMAL HIGH (ref 4.8–5.6)

## 2019-09-03 LAB — LIPID PANEL WITH LDL/HDL RATIO
Cholesterol, Total: 163 mg/dL (ref 100–199)
HDL: 55 mg/dL (ref >39)
LDL Chol Calc (NIH): 83 mg/dL (ref 0–99)
LDL/HDL Ratio: 1.5 ratio (ref 0.0–3.2)
Triglycerides: 147 mg/dL (ref 0–149)
VLDL Cholesterol Cal: 25 mg/dL (ref 5–40)

## 2019-09-03 LAB — INSULIN, RANDOM: INSULIN: 7.5 u[IU]/mL (ref 2.6–24.9)

## 2019-09-03 LAB — VITAMIN D 25 HYDROXY (VIT D DEFICIENCY, FRACTURES): Vit D, 25-Hydroxy: 27.5 ng/mL — ABNORMAL LOW (ref 30.0–100.0)

## 2019-09-05 ENCOUNTER — Encounter (INDEPENDENT_AMBULATORY_CARE_PROVIDER_SITE_OTHER): Payer: Self-pay | Admitting: Physician Assistant

## 2019-09-16 ENCOUNTER — Ambulatory Visit (INDEPENDENT_AMBULATORY_CARE_PROVIDER_SITE_OTHER): Payer: Medicare Other | Admitting: Family Medicine

## 2019-09-22 ENCOUNTER — Ambulatory Visit (INDEPENDENT_AMBULATORY_CARE_PROVIDER_SITE_OTHER): Payer: Medicare Other | Admitting: Family Medicine

## 2019-09-22 ENCOUNTER — Encounter (INDEPENDENT_AMBULATORY_CARE_PROVIDER_SITE_OTHER): Payer: Self-pay | Admitting: Family Medicine

## 2019-09-22 ENCOUNTER — Other Ambulatory Visit: Payer: Self-pay

## 2019-09-22 VITALS — BP 130/82 | HR 81 | Temp 97.9°F | Ht 64.0 in | Wt 269.0 lb

## 2019-09-22 DIAGNOSIS — F3289 Other specified depressive episodes: Secondary | ICD-10-CM

## 2019-09-22 DIAGNOSIS — E1165 Type 2 diabetes mellitus with hyperglycemia: Secondary | ICD-10-CM | POA: Diagnosis not present

## 2019-09-22 DIAGNOSIS — M069 Rheumatoid arthritis, unspecified: Secondary | ICD-10-CM | POA: Diagnosis not present

## 2019-09-22 DIAGNOSIS — E559 Vitamin D deficiency, unspecified: Secondary | ICD-10-CM | POA: Diagnosis not present

## 2019-09-22 DIAGNOSIS — R7989 Other specified abnormal findings of blood chemistry: Secondary | ICD-10-CM | POA: Diagnosis not present

## 2019-09-22 DIAGNOSIS — Z6841 Body Mass Index (BMI) 40.0 and over, adult: Secondary | ICD-10-CM | POA: Diagnosis not present

## 2019-09-22 MED ORDER — TRULICITY 0.75 MG/0.5ML ~~LOC~~ SOAJ: 0.7500 mg | SUBCUTANEOUS | 0 refills | Status: DC

## 2019-09-22 MED ORDER — VITAMIN D (ERGOCALCIFEROL) 1.25 MG (50000 UNIT) PO CAPS: 50000.0000 [IU] | ORAL_CAPSULE | ORAL | 0 refills | Status: DC

## 2019-09-22 NOTE — Progress Notes (Signed)
Chief Complaint:   OBESITY Kai is here to discuss her progress with her obesity treatment plan along with follow-up of her obesity related diagnoses. Pailynn is on practicing portion control and making smarter food choices, such as increasing vegetables and decreasing simple carbohydrates and states she is following her eating plan approximately 60-70% of the time. Jaclyne states she is more mindful of movement.  Today's visit was #: 27 Starting weight: 281 lbs Starting date: 05/14/2018 Today's weight: 269 lbs Today's date: 09/22/2019 Total lbs lost to date: 12 lbs Total lbs lost since last in-office visit: 0  Interim History: Jahnae says she has had increased hunger and cravings.  She admits that her depression is worse.  I reviewed her recent labs and discussed with the patient.  Subjective:   1. Vitamin D deficiency Amberrose's Vitamin D level was 27.5 on 09/02/2019. She is currently taking vit D. She denies nausea, vomiting or muscle weakness.  2. Type 2 diabetes mellitus with hyperglycemia, without long-term current use of insulin (HCC) Medications reviewed. Mirelle is taking metformin 500 mg twice daily and Trulicity 0.75 mg weekly.  Lab Results  Component Value Date   HGBA1C 6.4 (H) 09/02/2019   HGBA1C 6.0 (H) 04/17/2019   HGBA1C 6.6 (H) 10/08/2018   Lab Results  Component Value Date   LDLCALC 83 09/02/2019   CREATININE 0.61 09/02/2019   Lab Results  Component Value Date   INSULIN 7.5 09/02/2019   INSULIN 4.2 04/17/2019   INSULIN 6.6 10/08/2018   INSULIN 9.8 05/14/2018   3. Abnormal thyroid blood test Thyroid was rechecked and is normal.  Lab Results  Component Value Date   TSH 3.190 09/02/2019   4. Rheumatoid arthritis, involving unspecified site, unspecified whether rheumatoid factor present (HCC) Kevonna started a prednisone burst this week and takes methotrexate.  5. Other depression, with emotional eating Brittni is struggling with emotional eating and  using food for comfort to the extent that it is negatively impacting her health. She has been working on behavior modification techniques to help reduce her emotional eating and has been unsuccessful. She shows no sign of suicidal or homicidal ideations.  She takes Cymbalta for depression.  Assessment/Plan:   1. Vitamin D deficiency Low Vitamin D level contributes to fatigue and are associated with obesity, breast, and colon cancer. She agrees to continue to take prescription Vitamin D @50 ,000 IU every 3 days and will follow-up for routine testing of Vitamin D, at least 2-3 times per year to avoid over-replacement.  Orders - Vitamin D, Ergocalciferol, (DRISDOL) 1.25 MG (50000 UNIT) CAPS capsule; Take 1 capsule (50,000 Units total) by mouth every 3 (three) days. On Friday  Dispense: 10 capsule; Refill: 0  2. Type 2 diabetes mellitus with hyperglycemia, without long-term current use of insulin (HCC) Good blood sugar control is important to decrease the likelihood of diabetic complications such as nephropathy, neuropathy, limb loss, blindness, coronary artery disease, and death. Intensive lifestyle modification including diet, exercise and weight loss are the first line of treatment for diabetes.   Orders - Dulaglutide (TRULICITY) 0.75 MG/0.5ML SOPN; Inject 0.75 mg into the skin once a week.  Dispense: 4 pen; Refill: 0  3. Abnormal thyroid blood test Will continue to monitor.  4. Rheumatoid arthritis Will follow because mobility and pain control are important for weight management.  5. Other depression, with emotional eating Behavior modification techniques were discussed today to help Corita deal with her emotional/non-hunger eating behaviors.  Orders and follow up as documented  in patient record.   6. Class 3 severe obesity with serious comorbidity and body mass index (BMI) of 45.0 to 49.9 in adult, unspecified obesity type (HCC) Kenyana is currently in the action stage of change. As such,  her goal is to continue with weight loss efforts. She has agreed to practicing portion control and making smarter food choices, such as increasing vegetables and decreasing simple carbohydrates.   Exercise goals: As is.  Behavioral modification strategies: increasing lean protein intake and increasing water intake.  Mariella has agreed to follow-up with our clinic in 2 weeks. She was informed of the importance of frequent follow-up visits to maximize her success with intensive lifestyle modifications for her multiple health conditions.   Objective:   Blood pressure 130/82, pulse 81, temperature 97.9 F (36.6 C), temperature source Oral, height 5\' 4"  (1.626 m), weight 269 lb (122 kg), last menstrual period 05/31/2012, SpO2 99 %. Body mass index is 46.17 kg/m.  General: Cooperative, alert, well developed, in no acute distress. HEENT: Conjunctivae and lids unremarkable. Cardiovascular: Regular rhythm.  Lungs: Normal work of breathing. Neurologic: No focal deficits.   Lab Results  Component Value Date   CREATININE 0.61 09/02/2019   BUN 15 09/02/2019   NA 141 09/02/2019   K 5.1 09/02/2019   CL 102 09/02/2019   CO2 27 09/02/2019   Lab Results  Component Value Date   ALT 19 09/02/2019   AST 15 09/02/2019   ALKPHOS 87 09/02/2019   BILITOT 0.3 09/02/2019   Lab Results  Component Value Date   HGBA1C 6.4 (H) 09/02/2019   HGBA1C 6.0 (H) 04/17/2019   HGBA1C 6.6 (H) 10/08/2018   HGBA1C 6.8 (H) 05/14/2018   Lab Results  Component Value Date   INSULIN 7.5 09/02/2019   INSULIN 4.2 04/17/2019   INSULIN 6.6 10/08/2018   INSULIN 9.8 05/14/2018   Lab Results  Component Value Date   TSH 3.190 09/02/2019   Lab Results  Component Value Date   CHOL 163 09/02/2019   HDL 55 09/02/2019   LDLCALC 83 09/02/2019   TRIG 147 09/02/2019   Lab Results  Component Value Date   WBC 8.0 05/14/2018   HGB 12.0 05/14/2018   HCT 37.4 05/14/2018   MCV 94 05/14/2018   PLT 380 11/06/2017    Obesity Behavioral Intervention:   Approximately 15 minutes were spent on the discussion below.  ASK: We discussed the diagnosis of obesity with 01/06/2018 today and Cheyna agreed to give [name] permission to discuss obesity behavioral modification therapy today.  ASSESS: Kimberli has the diagnosis of obesity and her BMI today is 46.2. Lynzee is in the action stage of change.   ADVISE: Secilia was educated on the multiple health risks of obesity as well as the benefit of weight loss to improve her health. She was advised of the need for long term treatment and the importance of lifestyle modifications to improve her current health and to decrease her risk of future health problems.  AGREE: Multiple dietary modification options and treatment options were discussed and Meghan agreed to follow the recommendations documented in the above note.  ARRANGE: Jimmye was educated on the importance of frequent visits to treat obesity as outlined per CMS and USPSTF guidelines and agreed to schedule her next follow up appointment today.  Attestation Statements:   Reviewed by clinician on day of visit: allergies, medications, problem list, medical history, surgical history, family history, social history, and previous encounter notes.  I, [name], CMA, am acting as  transcriptionist for Briscoe Deutscher, DO.  I have reviewed the above documentation for accuracy and completeness, and I agree with the above. Briscoe Deutscher, DO

## 2019-09-26 ENCOUNTER — Encounter (INDEPENDENT_AMBULATORY_CARE_PROVIDER_SITE_OTHER): Payer: Self-pay | Admitting: Family Medicine

## 2019-10-08 ENCOUNTER — Other Ambulatory Visit: Payer: Self-pay

## 2019-10-08 ENCOUNTER — Ambulatory Visit (INDEPENDENT_AMBULATORY_CARE_PROVIDER_SITE_OTHER): Payer: Medicare Other | Admitting: Family Medicine

## 2019-10-08 ENCOUNTER — Encounter (INDEPENDENT_AMBULATORY_CARE_PROVIDER_SITE_OTHER): Payer: Self-pay | Admitting: Family Medicine

## 2019-10-08 VITALS — BP 142/68 | HR 80 | Temp 97.8°F | Ht 64.0 in | Wt 272.0 lb

## 2019-10-08 DIAGNOSIS — E1165 Type 2 diabetes mellitus with hyperglycemia: Secondary | ICD-10-CM

## 2019-10-08 DIAGNOSIS — Z6841 Body Mass Index (BMI) 40.0 and over, adult: Secondary | ICD-10-CM

## 2019-10-08 DIAGNOSIS — E559 Vitamin D deficiency, unspecified: Secondary | ICD-10-CM

## 2019-10-08 DIAGNOSIS — F3289 Other specified depressive episodes: Secondary | ICD-10-CM | POA: Diagnosis not present

## 2019-10-08 DIAGNOSIS — M069 Rheumatoid arthritis, unspecified: Secondary | ICD-10-CM

## 2019-10-08 NOTE — Progress Notes (Signed)
Chief Complaint:   OBESITY Sally [name] is here to discuss her progress with her obesity treatment plan along with follow-up of her obesity related diagnoses. Sally [name] is on practicing portion control and making smarter food choices, such as increasing vegetables and decreasing simple carbohydrates and states she is following her eating plan approximately 50% of the time. Sally [name] states she is dancing with her kids for 10 minutes 5 times per week.  Today's visit was #: 28 Starting weight: 281 lbs Starting date: 05/14/2018 Today's weight: 272 lbs Today's date: 10/08/2019 Total lbs lost to date: 9 lbs Total lbs lost since last in-office visit: 0  Interim History:   Sally [name] provided the following food recall today: Breakfast:  Caramel mocha coffee <12 ounce. Lunch:  Deli sandwich or peanut butter sandwich or school lunch Dinner:  Steak/cheese sub (diner) usually - salad or sandwich  Medication: Sally [name] had her first Trulicity dose yesterday.  She denies nausea.   Appetite: Hunger controlled.  Barriers: Redoing kitchen.  Subjective:   1. Type 2 diabetes mellitus with hyperglycemia, without long-term current use of insulin (HCC) Sally [name] has a diagnosis of prediabetes based on her elevated HgA1c and was informed this puts her at greater risk of developing diabetes. She continues to work on diet and exercise to decrease her risk of diabetes. She denies nausea or hypoglycemia.  She is taking metformin 500 mg twice daily and Trulicity 0.75 weekly.  Lab Results  Component Value Date   HGBA1C 6.4 (H) 09/02/2019   Lab Results  Component Value Date   INSULIN 7.5 09/02/2019   INSULIN 4.2 04/17/2019   INSULIN 6.6 10/08/2018   INSULIN 9.8 05/14/2018   2. Vitamin D deficiency Sally [name]'s Vitamin D level was 27.5 on 09/02/2019. She is currently taking vit D. She denies nausea, vomiting or muscle weakness.  3. Rheumatoid arthritis Sally [name] is taking methotrexate for her rheumatoid arthritis.  4. Other  depression, with emotional eating Sally [name] is struggling with emotional eating and using food for comfort to the extent that it is negatively impacting her health. She has been working on behavior modification techniques to help reduce her emotional eating and has been unsuccessful. She shows no sign of suicidal or homicidal ideations.  She is taking Cymbalta.  Assessment/Plan:   1. Type 2 diabetes mellitus with hyperglycemia, without long-term current use of insulin (HCC) Good blood sugar control is important to decrease the likelihood of diabetic complications such as nephropathy, neuropathy, limb loss, blindness, coronary artery disease, and death. Intensive lifestyle modification including diet, exercise and weight loss are the first line of treatment for diabetes.   2. Vitamin D deficiency Low Vitamin D level contributes to fatigue and are associated with obesity, breast, and colon cancer. She agrees to continue to take prescription Vitamin D @50 ,000 IU every week and will follow-up for routine testing of Vitamin D, at least 2-3 times per year to avoid over-replacement.  3. Rheumatoid arthritis Will follow because mobility and pain control are important for weight management.  4. Other depression, with emotional eating Behavior modification techniques were discussed today to help Sally [name] deal with her emotional/non-hunger eating behaviors.  Orders and follow up as documented in patient record.   5. Class 3 severe obesity with serious comorbidity and body mass index (BMI) of 45.0 to 49.9 in adult, unspecified obesity type (HCC) Sally [name] is currently in the action stage of change. As such, her goal is to continue with weight loss efforts. She has agreed to the Category 3 Plan.  We reviewed the diet plan, protein goals, and okay restaurant options.  Exercise goals: As is.  Behavioral modification strategies: increasing lean protein intake and increasing water intake.  Sally [name] has agreed to  follow-up with our clinic in 2 weeks. She was informed of the importance of frequent follow-up visits to maximize her success with intensive lifestyle modifications for her multiple health conditions.   Objective:   Blood pressure (!) 142/68, pulse 80, temperature 97.8 F (36.6 C), temperature source Oral, height 5\' 4"  (1.626 m), weight 272 lb (123.4 kg), last menstrual period 05/31/2012, SpO2 98 %. Body mass index is 46.69 kg/m.  General: Cooperative, alert, well developed, in no acute distress. HEENT: Conjunctivae and lids unremarkable. Cardiovascular: Regular rhythm.  Lungs: Normal work of breathing. Neurologic: No focal deficits.   Lab Results  Component Value Date   CREATININE 0.61 09/02/2019   BUN 15 09/02/2019   NA 141 09/02/2019   K 5.1 09/02/2019   CL 102 09/02/2019   CO2 27 09/02/2019   Lab Results  Component Value Date   ALT 19 09/02/2019   AST 15 09/02/2019   ALKPHOS 87 09/02/2019   BILITOT 0.3 09/02/2019   Lab Results  Component Value Date   HGBA1C 6.4 (H) 09/02/2019   HGBA1C 6.0 (H) 04/17/2019   HGBA1C 6.6 (H) 10/08/2018   HGBA1C 6.8 (H) 05/14/2018   Lab Results  Component Value Date   INSULIN 7.5 09/02/2019   INSULIN 4.2 04/17/2019   INSULIN 6.6 10/08/2018   INSULIN 9.8 05/14/2018   Lab Results  Component Value Date   TSH 3.190 09/02/2019   Lab Results  Component Value Date   CHOL 163 09/02/2019   HDL 55 09/02/2019   LDLCALC 83 09/02/2019   TRIG 147 09/02/2019   Lab Results  Component Value Date   WBC 8.0 05/14/2018   HGB 12.0 05/14/2018   HCT 37.4 05/14/2018   MCV 94 05/14/2018   PLT 380 11/06/2017   Obesity Behavioral Intervention:   Approximately 15 minutes were spent on the discussion below.  ASK: We discussed the diagnosis of obesity with Sally [name] today and Sally [name] agreed to give [name] permission to discuss obesity behavioral modification therapy today.  ASSESS: Sally [name] has the diagnosis of obesity and her BMI today is 46.7. Sally [name]  is in the action stage of change.   ADVISE: Sally [name] was educated on the multiple health risks of obesity as well as the benefit of weight loss to improve her health. She was advised of the need for long term treatment and the importance of lifestyle modifications to improve her current health and to decrease her risk of future health problems.  AGREE: Multiple dietary modification options and treatment options were discussed and Sally [name] agreed to follow the recommendations documented in the above note.  ARRANGE: Sally [name] was educated on the importance of frequent visits to treat obesity as outlined per CMS and USPSTF guidelines and agreed to schedule her next follow up appointment today.  Attestation Statements:   Reviewed by clinician on day of visit: allergies, medications, problem list, medical history, surgical history, family history, social history, and previous encounter notes.  I performed a medically necessary appropriate examination and/or evaluation. I discussed the assessment and treatment plan with the patient. The patient was provided an opportunity to ask questions and all were answered. The patient agreed with the plan and demonstrated an understanding of the instructions. Clinical information was updated and documented in the EMR. Time spent on visit including pre-visit chart review and post-visit  care was 25 minutes.  I, Insurance claims handler, CMA, am acting as Energy manager for [employer], DO.  I have reviewed the above documentation for accuracy and completeness, and I agree with the above. Helane Rima, DO

## 2019-10-23 ENCOUNTER — Encounter (INDEPENDENT_AMBULATORY_CARE_PROVIDER_SITE_OTHER): Payer: Self-pay | Admitting: Bariatrics

## 2019-10-23 DIAGNOSIS — R7303 Prediabetes: Secondary | ICD-10-CM | POA: Insufficient documentation

## 2019-10-23 DIAGNOSIS — E559 Vitamin D deficiency, unspecified: Secondary | ICD-10-CM | POA: Insufficient documentation

## 2019-10-27 ENCOUNTER — Other Ambulatory Visit: Payer: Self-pay

## 2019-10-27 ENCOUNTER — Ambulatory Visit (INDEPENDENT_AMBULATORY_CARE_PROVIDER_SITE_OTHER): Payer: Medicare Other | Admitting: Bariatrics

## 2019-10-27 ENCOUNTER — Encounter (INDEPENDENT_AMBULATORY_CARE_PROVIDER_SITE_OTHER): Payer: Self-pay | Admitting: Bariatrics

## 2019-10-27 VITALS — BP 119/75 | HR 81 | Temp 97.8°F | Ht 64.0 in | Wt 266.0 lb

## 2019-10-27 DIAGNOSIS — Z6841 Body Mass Index (BMI) 40.0 and over, adult: Secondary | ICD-10-CM | POA: Diagnosis not present

## 2019-10-27 DIAGNOSIS — R12 Heartburn: Secondary | ICD-10-CM

## 2019-10-27 DIAGNOSIS — E559 Vitamin D deficiency, unspecified: Secondary | ICD-10-CM

## 2019-10-27 DIAGNOSIS — E1165 Type 2 diabetes mellitus with hyperglycemia: Secondary | ICD-10-CM

## 2019-10-27 MED ORDER — OMEPRAZOLE 20 MG PO CPDR: 20.0000 mg | DELAYED_RELEASE_CAPSULE | Freq: Every day | ORAL | 0 refills | Status: DC

## 2019-10-27 MED ORDER — VITAMIN D (ERGOCALCIFEROL) 1.25 MG (50000 UNIT) PO CAPS: 50000.0000 [IU] | ORAL_CAPSULE | ORAL | 0 refills | Status: DC

## 2019-10-27 MED ORDER — TRULICITY 0.75 MG/0.5ML ~~LOC~~ SOAJ: 0.7500 mg | SUBCUTANEOUS | 0 refills | Status: DC

## 2019-10-27 NOTE — Progress Notes (Signed)
Chief Complaint:   OBESITY Sally [name] is here to discuss her progress with her obesity treatment plan along with follow-up of her obesity related diagnoses. Sally [name] is on the Category 3 Plan and states she is following her eating plan approximately 60% of the time. Sally [name] states she is walking/exercises 40 minutes 5 times per week.  Today's visit was #: 29 Starting weight: 281 lbs Starting date: 05/14/2018 Today's weight: 266 lbs Today's date: 10/27/2019 Total lbs lost to date: 15 Total lbs lost since last in-office visit: 6  Interim History: Sally [name] is down 6 lbs. She states that she is doing better.  Subjective:   Vitamin D deficiency. No nausea, vomiting, or muscle weakness. Last Vitamin D 27.5 on 09/02/2019.  Heartburn. Heartburn is controlled with medication.  Type 2 diabetes mellitus with hyperglycemia, without long-term current use of insulin (HCC). Sally [name] is taking Trulicity.   Lab Results  Component Value Date   HGBA1C 6.4 (H) 09/02/2019   HGBA1C 6.0 (H) 04/17/2019   HGBA1C 6.6 (H) 10/08/2018   Lab Results  Component Value Date   LDLCALC 83 09/02/2019   CREATININE 0.61 09/02/2019   Lab Results  Component Value Date   INSULIN 7.5 09/02/2019   INSULIN 4.2 04/17/2019   INSULIN 6.6 10/08/2018   INSULIN 9.8 05/14/2018   Assessment/Plan:   Vitamin D deficiency. Low Vitamin D level contributes to fatigue and are associated with obesity, breast, and colon cancer. She was given a prescription for Vitamin D, Ergocalciferol, (DRISDOL) 1.25 MG (50000 UNIT) CAPS capsule one every 3 days on Friday #10 with 0 refills and will follow-up for routine testing of Vitamin D, at least 2-3 times per year to avoid over-replacement.    Heartburn. Sally [name] was given a prescription for omeprazole (PRILOSEC) 20 MG capsule 1 PO daily #30 with 0 refills.  Type 2 diabetes mellitus with hyperglycemia, without long-term current use of insulin (HCC). Good blood sugar control is  important to decrease the likelihood of diabetic complications such as nephropathy, neuropathy, limb loss, blindness, coronary artery disease, and death. Intensive lifestyle modification including diet, exercise and weight loss are the first line of treatment for diabetes. Sally [name] was given a prescription for Dulaglutide (TRULICITY) 0.75 MG/0.5ML SOPN into skin once a week, 1 month supply, with 0 refills.  Class 3 severe obesity with serious comorbidity and body mass index (BMI) of 45.0 to 49.9 in adult, unspecified obesity type (HCC).  Sally [name] is currently in the action stage of change. As such, her goal is to continue with weight loss efforts. She has agreed to the Category 3 Plan.   She will work on meal planning, intentional eating, increasing protein, decreasing portion size, and increasing her water intake.  Exercise goals: All adults should avoid inactivity. Some physical activity is better than none, and adults who participate in any amount of physical activity gain some health benefits.  Behavioral modification strategies: increasing lean protein intake, decreasing simple carbohydrates, increasing vegetables, increasing water intake, decreasing eating out, no skipping meals, meal planning and cooking strategies, keeping healthy foods in the home, dealing with family or coworker sabotage, travel eating strategies, holiday eating strategies , celebration eating strategies and keeping a strict food journal.  Sally [name] has agreed to follow-up with our clinic in 2 weeks. She was informed of the importance of frequent follow-up visits to maximize her success with intensive lifestyle modifications for her multiple health conditions.   Objective:   Blood pressure 119/75, pulse 81, temperature 97.8 F (36.6  C), height 5\' 4"  (1.626 m), weight 266 lb (120.7 kg), last menstrual period 05/31/2012, SpO2 97 %. Body mass index is 45.66 kg/m.  General: Cooperative, alert, well developed, in no acute  distress. HEENT: Conjunctivae and lids unremarkable. Cardiovascular: Regular rhythm.  Lungs: Normal work of breathing. Neurologic: No focal deficits.   Lab Results  Component Value Date   CREATININE 0.61 09/02/2019   BUN 15 09/02/2019   NA 141 09/02/2019   K 5.1 09/02/2019   CL 102 09/02/2019   CO2 27 09/02/2019   Lab Results  Component Value Date   ALT 19 09/02/2019   AST 15 09/02/2019   ALKPHOS 87 09/02/2019   BILITOT 0.3 09/02/2019   Lab Results  Component Value Date   HGBA1C 6.4 (H) 09/02/2019   HGBA1C 6.0 (H) 04/17/2019   HGBA1C 6.6 (H) 10/08/2018   HGBA1C 6.8 (H) 05/14/2018   Lab Results  Component Value Date   INSULIN 7.5 09/02/2019   INSULIN 4.2 04/17/2019   INSULIN 6.6 10/08/2018   INSULIN 9.8 05/14/2018   Lab Results  Component Value Date   TSH 3.190 09/02/2019   Lab Results  Component Value Date   CHOL 163 09/02/2019   HDL 55 09/02/2019   LDLCALC 83 09/02/2019   TRIG 147 09/02/2019   Lab Results  Component Value Date   WBC 8.0 05/14/2018   HGB 12.0 05/14/2018   HCT 37.4 05/14/2018   MCV 94 05/14/2018   PLT 380 11/06/2017   No results found for: IRON, TIBC, FERRITIN  Obesity Behavioral Intervention Documentation for Insurance:   Approximately 15 minutes were spent on the discussion below.  ASK: We discussed the diagnosis of obesity with Sally [name] today and Sally [name] agreed to give [name] permission to discuss obesity behavioral modification therapy today.  ASSESS: Sally [name] has the diagnosis of obesity and her BMI today is 45.8. [name] is in the action stage of change.   ADVISE: Sally [name] was educated on the multiple health risks of obesity as well as the benefit of weight loss to improve her health. She was advised of the need for long term treatment and the importance of lifestyle modifications to improve her current health and to decrease her risk of future health problems.  AGREE: Multiple dietary modification options and treatment options were  discussed and Sally [name] agreed to follow the recommendations documented in the above note.  ARRANGE: Sally [name] was educated on the importance of frequent visits to treat obesity as outlined per CMS and USPSTF guidelines and agreed to schedule her next follow up appointment today.  Attestation Statements:   Reviewed by clinician on day of visit: allergies, medications, problem list, medical history, surgical history, family history, social history, and previous encounter notes.  Migdalia Dk, am acting as Location manager for [employer], DO   I have reviewed the above documentation for accuracy and completeness, and I agree with the above. Jearld Lesch, DO

## 2019-10-28 ENCOUNTER — Encounter (INDEPENDENT_AMBULATORY_CARE_PROVIDER_SITE_OTHER): Payer: Self-pay | Admitting: Bariatrics

## 2019-10-28 DIAGNOSIS — Z6841 Body Mass Index (BMI) 40.0 and over, adult: Secondary | ICD-10-CM | POA: Insufficient documentation

## 2019-11-10 ENCOUNTER — Other Ambulatory Visit: Payer: Self-pay

## 2019-11-10 ENCOUNTER — Ambulatory Visit (INDEPENDENT_AMBULATORY_CARE_PROVIDER_SITE_OTHER): Payer: Medicare Other | Admitting: Family Medicine

## 2019-11-10 ENCOUNTER — Encounter (INDEPENDENT_AMBULATORY_CARE_PROVIDER_SITE_OTHER): Payer: Self-pay | Admitting: Family Medicine

## 2019-11-10 VITALS — BP 111/72 | HR 76 | Temp 98.1°F | Ht 64.0 in | Wt 263.0 lb

## 2019-11-10 DIAGNOSIS — E559 Vitamin D deficiency, unspecified: Secondary | ICD-10-CM | POA: Diagnosis not present

## 2019-11-10 DIAGNOSIS — E119 Type 2 diabetes mellitus without complications: Secondary | ICD-10-CM | POA: Insufficient documentation

## 2019-11-10 DIAGNOSIS — Z6841 Body Mass Index (BMI) 40.0 and over, adult: Secondary | ICD-10-CM

## 2019-11-10 DIAGNOSIS — E1169 Type 2 diabetes mellitus with other specified complication: Secondary | ICD-10-CM | POA: Diagnosis not present

## 2019-11-10 MED ORDER — TRULICITY 0.75 MG/0.5ML ~~LOC~~ SOAJ: 0.7500 mg | SUBCUTANEOUS | 0 refills | Status: DC

## 2019-11-10 MED ORDER — VITAMIN D (ERGOCALCIFEROL) 1.25 MG (50000 UNIT) PO CAPS: 50000.0000 [IU] | ORAL_CAPSULE | ORAL | 0 refills | Status: DC

## 2019-11-10 NOTE — Progress Notes (Signed)
Chief Complaint:   OBESITY Sally [name] is here to discuss her progress with her obesity treatment plan along with follow-up of her obesity related diagnoses. Sally [name] is on the Category 3 Plan and states she is following her eating plan approximately 50-60% of the time. Sally [name] states she is walking 10 minutes 5 times per week.  Today's visit was #: 30 Starting weight: 281 lbs Starting date: 05/14/2018 Today's weight: 263 lbs Today's date: 11/10/2019 Total lbs lost to date: 18 Total lbs lost since last in-office visit: 3  Interim History: Sally [name] reports polyphagia when she is not on Trulicity. She recently ran out and noticed an increase in hunger.   She does not always eat all of the protein on the plan. She often eats deli meat on the go for some of her meals.  Subjective:   Type 2 diabetes mellitus with hyperglycemia, without long-term current use of insulin ([place]). Sally [name] feels Trulicity is helping with appetite and satiety. Diabetes mellitus is well controlled. She does not check her CBG's. Sally [name] is also on metformin 500 BID.   Lab Results  Component Value Date   HGBA1C 6.4 (H) 09/02/2019   HGBA1C 6.0 (H) 04/17/2019   HGBA1C 6.6 (H) 10/08/2018   Lab Results  Component Value Date   LDLCALC 83 09/02/2019   CREATININE 0.61 09/02/2019   Lab Results  Component Value Date   INSULIN 7.5 09/02/2019   INSULIN 4.2 04/17/2019   INSULIN 6.6 10/08/2018   INSULIN 9.8 05/14/2018   Vitamin D deficiency. Last Vitamin D level was not at goal - 27.5 on 09/02/2019. Sally [name] is on prescription Vitamin D every 3 days.  Assessment/Plan:   Type 2 diabetes mellitus with hyperglycemia, without long-term current use of insulin ([place]). Good blood sugar control is important to decrease the likelihood of diabetic complications such as nephropathy, neuropathy, limb loss, blindness, coronary artery disease, and death. Intensive lifestyle modification including diet, exercise and weight loss are  the first line of treatment for diabetes. Keith was given a refill on her Dulaglutide (TRULICITY) 8.09 XI/3.3AS SOPN 0.75 weekly #4 pens with 0 refills.  Vitamin D deficiency. Low Vitamin D level contributes to fatigue and are associated with obesity, breast, and colon cancer. She was given a refill on her Vitamin D, Ergocalciferol, (DRISDOL) 1.25 MG (50000 UNIT) CAPS capsule every 3 days #10 with 0 refills and will follow-up for routine testing of Vitamin D, at least 2-3 times per year to avoid over-replacement.    Class 3 severe obesity with serious comorbidity and body mass index (BMI) of 45.0 to 49.9 in adult, unspecified obesity type ([place]).  Sally [name] is currently in the action stage of change. As such, her goal is to continue with weight loss efforts. She has agreed to the Category 3 Plan.   She will weigh protein and aim for 12 or between lunch and dinner. Handout was given on Protein Equivalents. She will weight protein portions.   Exercise goals: Sally [name] will continue her current exercise regimen.  Behavioral modification strategies: increasing lean protein intake and planning for success.  Sally [name] has agreed to follow-up with our clinic in 2 weeks. She was informed of the importance of frequent follow-up visits to maximize her success with intensive lifestyle modifications for her multiple health conditions.   Objective:   Blood pressure 111/72, pulse 76, temperature 98.1 F (36.7 C), temperature source Oral, height 5\' 4"  (1.626 m), weight 263 lb (119.3 kg), last menstrual period 05/31/2012, SpO2 97 %.  Body mass index is 45.14 kg/m.  General: Cooperative, alert, well developed, in no acute distress. HEENT: Conjunctivae and lids unremarkable. Cardiovascular: Regular rhythm.  Lungs: Normal work of breathing. Neurologic: No focal deficits.   Lab Results  Component Value Date   CREATININE 0.61 09/02/2019   BUN 15 09/02/2019   NA 141 09/02/2019   K 5.1 09/02/2019   CL 102  09/02/2019   CO2 27 09/02/2019   Lab Results  Component Value Date   ALT 19 09/02/2019   AST 15 09/02/2019   ALKPHOS 87 09/02/2019   BILITOT 0.3 09/02/2019   Lab Results  Component Value Date   HGBA1C 6.4 (H) 09/02/2019   HGBA1C 6.0 (H) 04/17/2019   HGBA1C 6.6 (H) 10/08/2018   HGBA1C 6.8 (H) 05/14/2018   Lab Results  Component Value Date   INSULIN 7.5 09/02/2019   INSULIN 4.2 04/17/2019   INSULIN 6.6 10/08/2018   INSULIN 9.8 05/14/2018   Lab Results  Component Value Date   TSH 3.190 09/02/2019   Lab Results  Component Value Date   CHOL 163 09/02/2019   HDL 55 09/02/2019   LDLCALC 83 09/02/2019   TRIG 147 09/02/2019   Lab Results  Component Value Date   WBC 8.0 05/14/2018   HGB 12.0 05/14/2018   HCT 37.4 05/14/2018   MCV 94 05/14/2018   PLT 380 11/06/2017   No results found for: IRON, TIBC, FERRITIN  Obesity Behavioral Intervention Documentation for Insurance:   Approximately 15 minutes were spent on the discussion below.  ASK: We discussed the diagnosis of obesity with Sally [name] today and Sally [name] agreed to give Sally [name] permission to discuss obesity behavioral modification therapy today.  ASSESS: Sally [name] has the diagnosis of obesity and her BMI today is 45.2. Sally [name] is in the action stage of change.   ADVISE: Sally [name] was educated on the multiple health risks of obesity as well as the benefit of weight loss to improve her health. She was advised of the need for long term treatment and the importance of lifestyle modifications to improve her current health and to decrease her risk of future health problems.  AGREE: Multiple dietary modification options and treatment options were discussed and Sally [name] agreed to follow the recommendations documented in the above note.  ARRANGE: Sally [name] was educated on the importance of frequent visits to treat obesity as outlined per CMS and USPSTF guidelines and agreed to schedule her next follow up appointment today.  Attestation  Statements:   Reviewed by clinician on day of visit: allergies, medications, problem list, medical history, surgical history, family history, social history, and previous encounter notes.  [name], am acting as Energy manager for [place], FNP   I have reviewed the above documentation for accuracy and completeness, and I agree with the above. -  [name], FNP

## 2019-11-25 ENCOUNTER — Ambulatory Visit (INDEPENDENT_AMBULATORY_CARE_PROVIDER_SITE_OTHER): Payer: Medicare Other | Admitting: Family Medicine

## 2019-12-10 ENCOUNTER — Other Ambulatory Visit: Payer: Self-pay

## 2019-12-10 ENCOUNTER — Ambulatory Visit (INDEPENDENT_AMBULATORY_CARE_PROVIDER_SITE_OTHER): Payer: Medicare Other | Admitting: Family Medicine

## 2019-12-10 ENCOUNTER — Encounter (INDEPENDENT_AMBULATORY_CARE_PROVIDER_SITE_OTHER): Payer: Self-pay | Admitting: Family Medicine

## 2019-12-10 VITALS — BP 118/73 | HR 80 | Temp 98.0°F | Ht 64.0 in | Wt 261.0 lb

## 2019-12-10 DIAGNOSIS — E119 Type 2 diabetes mellitus without complications: Secondary | ICD-10-CM | POA: Diagnosis not present

## 2019-12-10 DIAGNOSIS — Z794 Long term (current) use of insulin: Secondary | ICD-10-CM | POA: Diagnosis not present

## 2019-12-10 DIAGNOSIS — E1169 Type 2 diabetes mellitus with other specified complication: Secondary | ICD-10-CM | POA: Diagnosis not present

## 2019-12-10 DIAGNOSIS — Z6841 Body Mass Index (BMI) 40.0 and over, adult: Secondary | ICD-10-CM

## 2019-12-10 DIAGNOSIS — E559 Vitamin D deficiency, unspecified: Secondary | ICD-10-CM | POA: Diagnosis not present

## 2019-12-10 MED ORDER — VITAMIN D (ERGOCALCIFEROL) 1.25 MG (50000 UNIT) PO CAPS: 50000.0000 [IU] | ORAL_CAPSULE | ORAL | 0 refills | Status: DC

## 2019-12-10 MED ORDER — TRULICITY 0.75 MG/0.5ML ~~LOC~~ SOAJ: 1.5000 mg | SUBCUTANEOUS | 0 refills | Status: DC

## 2019-12-10 NOTE — Progress Notes (Signed)
Chief Complaint:   OBESITY Sally [name] is here to discuss her progress with her obesity treatment plan along with follow-up of her obesity related diagnoses. Sally [name] is on the Category 3 Plan and states she is following her eating plan approximately 80% of the time. Sally [name] states she is walking for 30 minutes 7 times per week.  Today's visit was #: 31 Starting weight: 281 lbs Starting date: 05/14/2018 Today's weight: 261 lbs Today's date: 12/10/2019 Total lbs lost to date: 20 Total lbs lost since last in-office visit: 2  Interim History: Sally [name] is disappointed with only 2 lbs weight loss. She is trying to lose weight to qualify for left knee replacement. She notes sometimes she has excessive hunger and cravings. She is now weighing her protein and doing better with protein intake.  Subjective:   1. Type 2 diabetes mellitus without complication, with long-term current use of insulin (HCC) Sally [name] notes polyphagia. She checks her CBGs sporadically. Her last CBG was 140 in the evening. She notes rare hypoglycemia. She is on metformin and Trulicity.  2. Vitamin D deficiency Sally [name]'s last Vit D level was low at 27.5. She is on prescription Vit D every 3 days.  Assessment/Plan:   1. Type 2 diabetes mellitus without complication, with long-term current use of insulin (HCC) Good blood sugar control is important to decrease the likelihood of diabetic complications such as nephropathy, neuropathy, limb loss, blindness, coronary artery disease, and death. Intensive lifestyle modification including diet, exercise and weight loss are the first line of treatment for diabetes. Sally [name] agreed to increase Trulicity to 1.5 mg weekly with no refills.  - Dulaglutide (TRULICITY) 0.75 MG/0.5ML SOPN; Inject 1.5 mg into the skin once a week.  Dispense: 4 pen; Refill: 0  2. Vitamin D deficiency Low Vitamin D level contributes to fatigue and are associated with obesity, breast, and colon cancer. We will refill  prescription Vitamin D for 1 month. Sally [name] will follow-up for routine testing of Vitamin D, at least 2-3 times per year to avoid over-replacement.  - Vitamin D, Ergocalciferol, (DRISDOL) 1.25 MG (50000 UNIT) CAPS capsule; Take 1 capsule (50,000 Units total) by mouth every 3 (three) days. On Friday  Dispense: 10 capsule; Refill: 0  3. Class 3 severe obesity with serious comorbidity and body mass index (BMI) of 40.0 to 44.9 in adult, unspecified obesity type (HCC) Sally [name] is currently in the action stage of change. As such, her goal is to continue with weight loss efforts. She has agreed to the Category 3 Plan.   Exercise goals: As is.  Behavioral modification strategies: increasing lean protein intake, meal planning and cooking strategies and planning for success.  Sally [name] has agreed to follow-up with our clinic in 2 weeks. She was informed of the importance of frequent follow-up visits to maximize her success with intensive lifestyle modifications for her multiple health conditions.   Objective:   Blood pressure 118/73, pulse 80, temperature 98 F (36.7 C), temperature source Oral, height 5\' 4"  (1.626 m), weight 261 lb (118.4 kg), last menstrual period 05/31/2012, SpO2 99 %. Body mass index is 44.8 kg/m.  General: Cooperative, alert, well developed, in no acute distress. HEENT: Conjunctivae and lids unremarkable. Cardiovascular: Regular rhythm.  Lungs: Normal work of breathing. Neurologic: No focal deficits.   Lab Results  Component Value Date   CREATININE 0.61 09/02/2019   BUN 15 09/02/2019   NA 141 09/02/2019   K 5.1 09/02/2019   CL 102 09/02/2019   CO2 27 09/02/2019  Lab Results  Component Value Date   ALT 19 09/02/2019   AST 15 09/02/2019   ALKPHOS 87 09/02/2019   BILITOT 0.3 09/02/2019   Lab Results  Component Value Date   HGBA1C 6.4 (H) 09/02/2019   HGBA1C 6.0 (H) 04/17/2019   HGBA1C 6.6 (H) 10/08/2018   HGBA1C 6.8 (H) 05/14/2018   Lab Results  Component Value  Date   INSULIN 7.5 09/02/2019   INSULIN 4.2 04/17/2019   INSULIN 6.6 10/08/2018   INSULIN 9.8 05/14/2018   Lab Results  Component Value Date   TSH 3.190 09/02/2019   Lab Results  Component Value Date   CHOL 163 09/02/2019   HDL 55 09/02/2019   LDLCALC 83 09/02/2019   TRIG 147 09/02/2019   Lab Results  Component Value Date   WBC 8.0 05/14/2018   HGB 12.0 05/14/2018   HCT 37.4 05/14/2018   MCV 94 05/14/2018   PLT 380 11/06/2017   No results found for: IRON, TIBC, FERRITIN  Obesity Behavioral Intervention Documentation for Insurance:   Approximately 15 minutes were spent on the discussion below.  ASK: We discussed the diagnosis of obesity with Sally [name] today and Sally [name] agreed to give [name] permission to discuss obesity behavioral modification therapy today.  ASSESS: Season has the diagnosis of obesity and her BMI today is 44.78. [name] is in the action stage of change.   ADVISE: Sally [name] was educated on the multiple health risks of obesity as well as the benefit of weight loss to improve her health. She was advised of the need for long term treatment and the importance of lifestyle modifications to improve her current health and to decrease her risk of future health problems.  AGREE: Multiple dietary modification options and treatment options were discussed and Sally [name] agreed to follow the recommendations documented in the above note.  ARRANGE: Sally [name] was educated on the importance of frequent visits to treat obesity as outlined per CMS and USPSTF guidelines and agreed to schedule her next follow up appointment today.  Attestation Statements:   Reviewed by clinician on day of visit: allergies, medications, problem list, medical history, surgical history, family history, social history, and previous encounter notes.   Wilhemena Durie, am acting as Location manager for [employer], FNP-C.  I have reviewed the above documentation for accuracy and completeness, and I agree  with the above. -  Georgianne Fick, FNP

## 2019-12-16 ENCOUNTER — Other Ambulatory Visit (INDEPENDENT_AMBULATORY_CARE_PROVIDER_SITE_OTHER): Payer: Self-pay

## 2019-12-16 DIAGNOSIS — E1165 Type 2 diabetes mellitus with hyperglycemia: Secondary | ICD-10-CM

## 2019-12-16 MED ORDER — TRULICITY 1.5 MG/0.5ML ~~LOC~~ SOAJ: 1.5000 mg | SUBCUTANEOUS | 0 refills | Status: DC

## 2019-12-20 ENCOUNTER — Other Ambulatory Visit: Payer: Self-pay

## 2019-12-20 ENCOUNTER — Encounter: Payer: Self-pay | Admitting: Emergency Medicine

## 2019-12-20 ENCOUNTER — Ambulatory Visit
Admission: EM | Admit: 2019-12-20 | Discharge: 2019-12-20 | Disposition: A | Discharge status: Home / Self Care | Payer: Medicare Other | Attending: Emergency Medicine | Admitting: Emergency Medicine

## 2019-12-20 ENCOUNTER — Ambulatory Visit (INDEPENDENT_AMBULATORY_CARE_PROVIDER_SITE_OTHER): Payer: Medicare Other

## 2019-12-20 DIAGNOSIS — W19XXXA Unspecified fall, initial encounter: Secondary | ICD-10-CM

## 2019-12-20 DIAGNOSIS — S63502A Unspecified sprain of left wrist, initial encounter: Secondary | ICD-10-CM

## 2019-12-20 DIAGNOSIS — S80211A Abrasion, right knee, initial encounter: Secondary | ICD-10-CM

## 2019-12-20 DIAGNOSIS — M25532 Pain in left wrist: Secondary | ICD-10-CM

## 2019-12-20 NOTE — Discharge Instructions (Addendum)
Recommend RICE: rest, ice, compression, elevation as needed for pain.    Heat therapy (hot compress, warm wash rag, hot showers, etc.) can help relax muscles and soothe muscle aches. Cold therapy (ice packs) can be used to help swelling both after injury and after prolonged use of areas of chronic pain/aches.  For pain: recommend 350 mg-1000 mg of Tylenol (acetaminophen) and/or 200 mg - 800 mg of Advil (ibuprofen, Motrin) every 8 hours as needed.  May alternate between the two throughout the day as they are generally safe to take together.  DO NOT exceed more than 3000 mg of Tylenol or 3200 mg of ibuprofen in a 24 hour period as this could damage your stomach, kidneys, liver, or increase your bleeding risk.  Important to follow-up with your orthopedic provider for persistent or worsening symptoms.

## 2019-12-20 NOTE — ED Provider Notes (Signed)
[facility]    CSN: [id number] Arrival date & time: 12/20/19  1425      History   Chief Complaint Chief Complaint  Patient presents with  . Fall    HPI Sally [name] is a 59 y.o. female with extensive medical history as outlined below including OA, PE, prediabetes, RA, obesity presenting for left wrist pain s/p fall.  States this occurred a few hours ago: Denies head trauma, LOC.  Patient states that she caught herself on her hands and knees.  States left snuffbox area is most tender.  Denies deformity, numbness.  Pain will sometimes radiate with certain movements to her fingers.  Patient has full active ROM of both knees, right wrist.  Patient does note some crepitus to right knee which has an abrasion.  No foreign body, active bleeding.   Past Medical History:  Diagnosis Date  . ADD (attention deficit disorder)   . Anemia   . Anxiety   . Arthritis   . Asthma   . Back pain   . CFS (chronic fatigue syndrome)   . Colitis   . Depression   . Diabetes ([place])   . Dyspnea   . Fibromyalgia   . GERD (gastroesophageal reflux disease)   . Hx of blood clots   . IBS (irritable bowel syndrome)   . Joint pain   . Left knee pain   . Leg edema   . Osteoarthritis   . Prediabetes   . Pulmonary embolism ([place])   . RA (rheumatoid arthritis) ([place])   . RA (rheumatoid arthritis) ([place])   . RLS (restless legs syndrome)   . Sleep apnea   . Vitamin B 12 deficiency   . Vitamin D deficiency     Patient Active Problem List   Diagnosis Date Noted  . Diabetes mellitus ([place]) 11/10/2019  . Class 3 severe obesity with serious comorbidity and body mass index (BMI) of 40.0 to 44.9 in adult ([place]) 10/28/2019  . Prediabetes 10/23/2019  . Vitamin D deficiency 10/23/2019  . Colitis   . RLS (restless legs syndrome)   . IBS (irritable bowel syndrome)     Past Surgical History:  Procedure Laterality Date  . EYE SURGERY    . MOUTH SURGERY      OB History    Gravida  6   Para    5   Term  5   Preterm      AB  1   Living  5     SAB      TAB      Ectopic      Multiple      Live Births               Home Medications    Prior to Admission medications   Medication Sig Start Date End Date Taking? Authorizing Provider  Dulaglutide (TRULICITY) 1.5 XB/3.5HG SOPN Inject 1.5 mg into the skin once a week. 12/16/19  Yes Whitmire, Dawn W, FNP  DULoxetine (CYMBALTA) 30 MG capsule Take 30 mg by mouth 3 (three) times daily. 10/31/17  Yes [provider]  Etanercept (ENBREL [place]) Inject into the skin.   Yes [provider]  folic acid (FOLVITE) 1 MG tablet Take 1 mg by mouth daily.   Yes [provider]  gabapentin (NEURONTIN) 300 MG capsule Take 300 mg by mouth 2 (two) times daily.  10/22/17  Yes [provider]  meloxicam (MOBIC) 15 MG tablet Take 15 mg by mouth at  bedtime. 09/26/17  Yes [provider]  metFORMIN (GLUCOPHAGE-XR) 500 MG 24 hr tablet Take 1 tablet (500 mg total) by mouth 2 (two) times daily. 04/17/19  Yes Abby Potash, PA-C  methotrexate (RHEUMATREX) 2.5 MG tablet Take 2.5 mg by mouth once a week. Take 8 tablets by mouth once a week on Friday. 10/29/17  Yes [provider]  montelukast (SINGULAIR) 10 MG tablet Take 10 mg by mouth at bedtime.   Yes [provider]  Multiple Vitamins-Minerals (HAIR SKIN & NAILS ADVANCED PO) Take 2 tablets by mouth 2 (two) times daily.   Yes [provider]  omeprazole (PRILOSEC) 20 MG capsule Take 1 capsule (20 mg total) by mouth daily. 10/27/19  Yes Jearld Lesch A, DO  Vitamin D, Ergocalciferol, (DRISDOL) 1.25 MG (50000 UNIT) CAPS capsule Take 1 capsule (50,000 Units total) by mouth every 3 (three) days. On Friday 12/10/19  Yes Whitmire, Dawn W, FNP  albuterol (PROVENTIL HFA;VENTOLIN HFA) 108 (90 BASE) MCG/ACT inhaler Inhale 2 puffs into the lungs every 6 (six) hours as needed. FOR WHEEZING     [provider]  blood glucose meter kit and  supplies Dispense based on patient and insurance preference. Use up to four times daily as directed. (FOR ICD-10 E10.9, E11.9). Test twice daily 05/27/18   Dennard Nip D, MD  cyclobenzaprine (FLEXERIL) 10 MG tablet Take 10 mg by mouth 3 (three) times daily as needed for muscle spasms.    [provider]  furosemide (LASIX) 20 MG tablet Take 20 mg by mouth daily as needed.    [provider]  mupirocin ointment (BACTROBAN) 2 % Place 1 application into the nose 2 (two) times daily as needed.    [provider]  traMADol (ULTRAM) 50 MG tablet Take 50 mg by mouth 2 (two) times daily as needed for moderate pain.  11/02/17   [provider]    Family History Family History  Problem Relation Age of Onset  . Pneumonia Mother   . Asthma Mother   . Diabetes Mother   . Hypertension Mother   . Depression Mother   . Anxiety disorder Mother   . Eating disorder Mother   . Obesity Mother   . Sudden death Mother   . Cancer Father        Brain tumor  . Asthma Father     Social History Social History   Tobacco Use  . Smoking status: Former Smoker    Packs/day: 1.50    Years: 8.00    Pack years: 12.00  . Smokeless tobacco: Never Used  Substance Use Topics  . Alcohol use: No    Alcohol/week: 0.0 standard drinks  . Drug use: No     Allergies   Patient has no known allergies.   Review of Systems As per HPI   Physical Exam Triage Vital Signs ED Triage Vitals  Enc Vitals Group     BP      Pulse      Resp      Temp      Temp src      SpO2      Weight      Height      Head Circumference      Peak Flow      Pain Score      Pain Loc      Pain Edu?      Excl. in [place]?    No data found.  Updated Vital Signs BP [name])  147/83 (BP Location: Left Arm)   Pulse (!) 55   Temp 98.2 F (36.8 C) (Oral)   Resp 18   LMP 05/31/2012   SpO2 96%   Visual Acuity Right Eye Distance:   Left Eye Distance:   Bilateral Distance:    Right Eye Near:     Left Eye Near:    Bilateral Near:     Physical Exam Constitutional:      General: She is not in acute distress. HENT:     Head: Normocephalic and atraumatic.  Eyes:     General: No scleral icterus.    Pupils: Pupils are equal, round, and reactive to light.  Cardiovascular:     Rate and Rhythm: Normal rate.  Pulmonary:     Effort: Pulmonary effort is normal.  Skin:    Coloration: Skin is not jaundiced or pale.     Comments: Small abrasion noted to right knee, inferior aspect.  Mild TTP without effusion.  Left knee unremarkable. Right wrist with full active ROM, neurovascular intact.  Left wrist with snuffbox tenderness, slightly decreased ROM.  Neurological:     Mental Status: She is alert and oriented to person, place, and time.      UC Treatments / Results  Labs (all labs ordered are listed, but only abnormal results are displayed) Labs Reviewed - No data to display  EKG   Radiology DG Wrist Complete Left  Result Date: 12/20/2019 CLINICAL DATA:  Pain in the left wrist snuffbox after falling earlier today. EXAM: LEFT WRIST - COMPLETE 3+ VIEW COMPARISON:  Radiograph 01/08/2018. FINDINGS: There are advanced degenerative changes at the 1st carpometacarpal articulation which appear mildly progressive. There are osteophytes with chronic adjacent bony fragments. No evidence of acute fracture or dislocation. No focal soft tissue abnormalities are identified. IMPRESSION: No evidence of acute fracture or dislocation. Mildly progressive advanced degenerative changes at the 1st carpometacarpal articulation. Electronically Signed   By: [name] Sale M.D.   On: 12/20/2019 15:09    Procedures Procedures (including critical care time)  Medications Ordered in UC Medications - No data to display  Initial Impression / Assessment and Plan / UC Course  I have reviewed the triage vital signs and the nursing notes.  Pertinent labs & imaging results that were available during my care of  the patient were reviewed by me and considered in my medical decision making (see chart for details).     Left wrist x-ray done office, reviewed by me and radiology: Negative for fracture, dislocation.  Mildly progressive advanced degenerative changes at 1st metacarpal articulation.  Reviewed findings with patient verbalized understanding.  Right knee abrasion dressed.  Ace wrap applied to left wrist.  Return precautions discussed, patient verbalized understanding and is agreeable to plan. Final Clinical Impressions(s) / UC Diagnoses   Final diagnoses:  Fall, initial encounter  Knee abrasion, right, initial encounter  Left wrist sprain, initial encounter     Discharge Instructions     Recommend RICE: rest, ice, compression, elevation as needed for pain.    Heat therapy (hot compress, warm wash rag, hot showers, etc.) can help relax muscles and soothe muscle aches. Cold therapy (ice packs) can be used to help swelling both after injury and after prolonged use of areas of chronic pain/aches.  For pain: recommend 350 mg-1000 mg of Tylenol (acetaminophen) and/or 200 mg - 800 mg of Advil (ibuprofen, Motrin) every 8 hours as needed.  May alternate between the two throughout the day as they are generally safe  to take together.  DO NOT exceed more than 3000 mg of Tylenol or 3200 mg of ibuprofen in a 24 hour period as this could damage your stomach, kidneys, liver, or increase your bleeding risk.  Important to follow-up with your orthopedic provider for persistent or worsening symptoms.    ED Prescriptions    None     I have reviewed the PDMP during this encounter.   [place], [place], [place] 12/20/19 1523

## 2019-12-20 NOTE — ED Triage Notes (Addendum)
Patient tripped over a parking curb in a parking lot, landing on concrete.  Right knee with abrasion, no pain, but gritty feeling with movement.  Left knee hurts on inside of knee.  Both hands are sore.  Larey Seat forward landing on right knee directly and both hands, patient has soreness in left side of torso-thought to be from abrupt jarring when she fell.  This incident occurred today  Patient received tdap one year ago

## 2019-12-24 ENCOUNTER — Ambulatory Visit (INDEPENDENT_AMBULATORY_CARE_PROVIDER_SITE_OTHER): Payer: Medicare Other | Admitting: Family Medicine

## 2020-01-07 ENCOUNTER — Other Ambulatory Visit: Payer: Self-pay

## 2020-01-07 ENCOUNTER — Ambulatory Visit (INDEPENDENT_AMBULATORY_CARE_PROVIDER_SITE_OTHER): Payer: Medicare Other | Admitting: Family Medicine

## 2020-01-08 ENCOUNTER — Ambulatory Visit (INDEPENDENT_AMBULATORY_CARE_PROVIDER_SITE_OTHER): Payer: Medicare Other | Admitting: Obstetrics and Gynecology

## 2020-01-08 ENCOUNTER — Encounter: Payer: Medicare Other | Admitting: Gynecology

## 2020-01-08 ENCOUNTER — Encounter: Payer: Self-pay | Admitting: Obstetrics and Gynecology

## 2020-01-08 VITALS — BP 130/84 | Ht 63.0 in | Wt 267.0 lb

## 2020-01-08 DIAGNOSIS — N951 Menopausal and female climacteric states: Secondary | ICD-10-CM | POA: Diagnosis not present

## 2020-01-08 DIAGNOSIS — Z01419 Encounter for gynecological examination (general) (routine) without abnormal findings: Secondary | ICD-10-CM | POA: Diagnosis not present

## 2020-01-08 NOTE — Progress Notes (Signed)
Sally [name] 24-Jun-1961 [id number]  SUBJECTIVE:  59 y.o. [id number] female for annual routine gynecologic exam. She has no gynecologic concerns.  Says she lost about 30 pounds since she started the weight loss program.  Current Outpatient Medications  Medication Sig Dispense Refill  . albuterol (PROVENTIL HFA;VENTOLIN HFA) 108 (90 BASE) MCG/ACT inhaler Inhale 2 puffs into the lungs every 6 (six) hours as needed. FOR WHEEZING     . blood glucose meter kit and supplies Dispense based on patient and insurance preference. Use up to four times daily as directed. (FOR ICD-10 E10.9, E11.9). Test twice daily 1 each 0  . cyclobenzaprine (FLEXERIL) 10 MG tablet Take 10 mg by mouth 3 (three) times daily as needed for muscle spasms.    . Dulaglutide (TRULICITY) 1.5 JO/8.7OM SOPN Inject 1.5 mg into the skin once a week. 4 pen 0  . DULoxetine (CYMBALTA) 30 MG capsule Take 30 mg by mouth 3 (three) times daily.    . Etanercept (ENBREL [place]) Inject into the skin.    . folic acid (FOLVITE) 1 MG tablet Take 1 mg by mouth daily.    . furosemide (LASIX) 20 MG tablet Take 20 mg by mouth daily as needed.    . gabapentin (NEURONTIN) 300 MG capsule Take 300 mg by mouth 2 (two) times daily.   0  . meloxicam (MOBIC) 15 MG tablet Take 15 mg by mouth at bedtime.  3  . metFORMIN (GLUCOPHAGE-XR) 500 MG 24 hr tablet Take 1 tablet (500 mg total) by mouth 2 (two) times daily. 180 tablet 0  . methotrexate (RHEUMATREX) 2.5 MG tablet Take 2.5 mg by mouth once a week. Take 8 tablets by mouth once a week on Friday.  3  . montelukast (SINGULAIR) 10 MG tablet Take 10 mg by mouth at bedtime.    . Multiple Vitamins-Minerals (HAIR SKIN & NAILS ADVANCED PO) Take 2 tablets by mouth 2 (two) times daily.    . mupirocin ointment (BACTROBAN) 2 % Place 1 application into the nose 2 (two) times daily as needed.    [name] omeprazole (PRILOSEC) 20 MG capsule Take 1 capsule (20 mg total) by mouth daily. 30 capsule 0  . traMADol (ULTRAM) 50 MG tablet  Take 50 mg by mouth 2 (two) times daily as needed for moderate pain.     . Vitamin D, Ergocalciferol, (DRISDOL) 1.25 MG (50000 UNIT) CAPS capsule Take 1 capsule (50,000 Units total) by mouth every 3 (three) days. On Friday 10 capsule 0   No current facility-administered medications for this visit.   Allergies: Patient has no known allergies.  Patient's last menstrual period was 05/31/2012.  Past medical history,surgical history, problem list, medications, allergies, family history and social history were all reviewed and documented as reviewed in the EPIC chart.  ROS:  Feeling well. No dyspnea or chest pain on exertion.  No abdominal pain, change in bowel habits, black or bloody stools.  No urinary tract symptoms. GYN ROS: no abnormal bleeding, pelvic pain or discharge, no breast pain or new or enlarging lumps on self exam. No neurological complaints.    OBJECTIVE:  BP 130/84 (Cuff Size: Large)   Ht 5' 3"  (1.6 m)   Wt 267 lb (121.1 kg)   LMP 05/31/2012   BMI 47.30 kg/m  The patient appears well, alert, oriented x 3, in no distress. ENT normal.  Neck supple. No cervical or supraclavicular adenopathy or thyromegaly.  Lungs are clear, good air entry, no wheezes, rhonchi or rales. S1 and  S2 normal, no murmurs, regular rate and rhythm.  Abdomen soft without tenderness, guarding, mass or organomegaly.  Neurological is normal, no focal findings.  BREAST EXAM: breasts appear normal, no suspicious masses, no skin or nipple changes or axillary nodes  PELVIC EXAM: VULVA: normal appearing vulva with no masses, tenderness or lesions, VAGINA: normal appearing vagina with normal color and discharge, no lesions, CERVIX: normal appearing cervix without discharge or lesions, UTERUS and ADNEXA: limited exam due to body habitus, uterus feels normal in shape and size, no normal adnexa in size, nontender and no masses  Chaperone: Caryn Bee present during the examination  ASSESSMENT:  59 y.o. [id number]  here for annual gynecologic exam  PLAN:   1. Postmenopausal.  Some mild hot flashes have been starting up again, I recommended trying black cohosh or soy products.  Hormones are an option but, with associated risks.  Could also look into nonhormonal options. 2. Pap smear/HPV 11/2016.  No significant history of abnormal Pap smears.  Next Pap smear due 2023 following the current guidelines recommending the 5 year interval. 3. Mammogram 12/2018.  Normal breast exam today.  She says her next mammogram is scheduled for 01/20/2020.   4. Colonoscopy 2014.  Recommended that she follow up at the recommended interval, says that she recently got a recall letter will plan to follow-up on that. 5. DEXA 12/2017 normal, next DEXA recommended 2024 at 5-year interval. 6. Health maintenance.  Congratulated on her weight loss and encouraged to continue keeping up with her efforts.  No labs today as she normally has these completed with her primary care doctor.    Return annually or sooner, prn.  Joseph Pierini MD 01/08/20

## 2020-01-12 ENCOUNTER — Encounter (INDEPENDENT_AMBULATORY_CARE_PROVIDER_SITE_OTHER): Payer: Self-pay | Admitting: Family Medicine

## 2020-01-12 ENCOUNTER — Ambulatory Visit (INDEPENDENT_AMBULATORY_CARE_PROVIDER_SITE_OTHER): Payer: Medicare Other | Admitting: Family Medicine

## 2020-01-12 ENCOUNTER — Other Ambulatory Visit: Payer: Self-pay

## 2020-01-12 VITALS — BP 109/72 | HR 83 | Temp 98.3°F | Ht 63.0 in | Wt 261.0 lb

## 2020-01-12 DIAGNOSIS — E559 Vitamin D deficiency, unspecified: Secondary | ICD-10-CM

## 2020-01-12 DIAGNOSIS — E1169 Type 2 diabetes mellitus with other specified complication: Secondary | ICD-10-CM

## 2020-01-12 DIAGNOSIS — Z6841 Body Mass Index (BMI) 40.0 and over, adult: Secondary | ICD-10-CM

## 2020-01-12 DIAGNOSIS — Z794 Long term (current) use of insulin: Secondary | ICD-10-CM | POA: Diagnosis not present

## 2020-01-12 MED ORDER — VITAMIN D (ERGOCALCIFEROL) 1.25 MG (50000 UNIT) PO CAPS: 50000.0000 [IU] | ORAL_CAPSULE | ORAL | 0 refills | Status: DC

## 2020-01-12 MED ORDER — TRULICITY 1.5 MG/0.5ML ~~LOC~~ SOAJ: 1.5000 mg | SUBCUTANEOUS | 0 refills | Status: DC

## 2020-01-12 NOTE — Progress Notes (Signed)
Chief Complaint:   OBESITY Sally [name] is here to discuss her progress with her obesity treatment plan along with follow-up of her obesity related diagnoses. Sally [name] is on the Category 3 Plan and states she is following her eating plan approximately 0% of the time. Sally [name] states she is walking/dancing at school 30 minutes 5 times per week.  Today's visit was #: 32 Starting weight: 281 lbs Starting date: 05/14/2018 Today's weight: 261 lbs Today's date: 01/12/2020 Total lbs lost to date: 20 Total lbs lost since last in-office visit: 0  Interim History: Sally [name] has been to the beach on vacation and was off plan. She has yet to get back on the plan.   Subjective:   Type 2 diabetes mellitus without complication, with long-term current use of insulin (HCC). DM is well controlled on Trulicity and metformin. She reports checking CBG's sporadically - last one was 140.  Lab Results  Component Value Date   HGBA1C 6.4 (H) 09/02/2019   HGBA1C 6.0 (H) 04/17/2019   HGBA1C 6.6 (H) 10/08/2018   Lab Results  Component Value Date   LDLCALC 83 09/02/2019   CREATININE 0.61 09/02/2019   Lab Results  Component Value Date   INSULIN 7.5 09/02/2019   INSULIN 4.2 04/17/2019   INSULIN 6.6 10/08/2018   INSULIN 9.8 05/14/2018   Vitamin D deficiency. Last Vitamin D was low at 27.5 on 09/02/2019. Sally [name] is on Vitamin D 50K every 3 days.  Assessment/Plan:   Type 2 diabetes mellitus without complication, with long-term current use of insulin (HCC). Good blood sugar control is important to decrease the likelihood of diabetic complications such as nephropathy, neuropathy, limb loss, blindness, coronary artery disease, and death. Intensive lifestyle modification including diet, exercise and weight loss are the first line of treatment for diabetes.Refill was given for Dulaglutide (TRULICITY) 1.5 MG/0.5ML SOPN 1.5 mg weekly #4 with 0 refills. Comprehensive metabolic panel, Hemoglobin A1c labs ordered  today.  Vitamin D deficiency. Low Vitamin D level contributes to fatigue and are associated with obesity, breast, and colon cancer. She was given a refill on her Vitamin D, Ergocalciferol, (DRISDOL) 1.25 MG (50000 UNIT) CAPS capsule every 3 days #10 with 0 refills and VITAMIN D 25 Hydroxy (Vit-D Deficiency, Fractures) level was ordered today.  Class 3 severe obesity with serious comorbidity and body mass index (BMI) of 45.0 to 49.9 in adult, unspecified obesity type (HCC).  Sally [name] is currently in the action stage of change. As such, her goal is to continue with weight loss efforts. She has agreed to the Category 3 Plan and will journal 450-600 calories and 40 grams of protein at supper.   Handouts were given on Journaling and Protein Content.  Exercise goals: Sally [name] will continue her current exercise regimen.  Behavioral modification strategies: increasing lean protein intake and decreasing simple carbohydrates.  Sally [name] has agreed to follow-up with our clinic in 2-3 weeks. She was informed of the importance of frequent follow-up visits to maximize her success with intensive lifestyle modifications for her multiple health conditions.   Sally [name] was informed we would discuss her lab results at her next visit unless there is a critical issue that needs to be addressed sooner. Sally [name] agreed to keep her next visit at the agreed upon time to discuss these results.  Objective:   Blood pressure 109/72, pulse 83, temperature 98.3 F (36.8 C), temperature source Oral, height 5\' 3"  (1.6 m), weight 261 lb (118.4 kg), last menstrual period 05/31/2012, SpO2 96 %. Body mass  index is 46.23 kg/m.  General: Cooperative, alert, well developed, in no acute distress. HEENT: Conjunctivae and lids unremarkable. Cardiovascular: Regular rhythm.  Lungs: Normal work of breathing. Neurologic: No focal deficits.   Lab Results  Component Value Date   CREATININE 0.61 09/02/2019   BUN 15 09/02/2019   NA 141  09/02/2019   K 5.1 09/02/2019   CL 102 09/02/2019   CO2 27 09/02/2019   Lab Results  Component Value Date   ALT 19 09/02/2019   AST 15 09/02/2019   ALKPHOS 87 09/02/2019   BILITOT 0.3 09/02/2019   Lab Results  Component Value Date   HGBA1C 6.4 (H) 09/02/2019   HGBA1C 6.0 (H) 04/17/2019   HGBA1C 6.6 (H) 10/08/2018   HGBA1C 6.8 (H) 05/14/2018   Lab Results  Component Value Date   INSULIN 7.5 09/02/2019   INSULIN 4.2 04/17/2019   INSULIN 6.6 10/08/2018   INSULIN 9.8 05/14/2018   Lab Results  Component Value Date   TSH 3.190 09/02/2019   Lab Results  Component Value Date   CHOL 163 09/02/2019   HDL 55 09/02/2019   LDLCALC 83 09/02/2019   TRIG 147 09/02/2019   Lab Results  Component Value Date   WBC 8.0 05/14/2018   HGB 12.0 05/14/2018   HCT 37.4 05/14/2018   MCV 94 05/14/2018   PLT 380 11/06/2017   No results found for: IRON, TIBC, FERRITIN  Obesity Behavioral Intervention Documentation for Insurance:   Approximately 15 minutes were spent on the discussion below.  ASK: We discussed the diagnosis of obesity with Sally [name] today and Sally [name] agreed to give [name] permission to discuss obesity behavioral modification therapy today.  ASSESS: Sally [name] has the diagnosis of obesity and her BMI today is 46.3. Sally [name] is in the action stage of change.   ADVISE: Sally [name] was educated on the multiple health risks of obesity as well as the benefit of weight loss to improve her health. She was advised of the need for long term treatment and the importance of lifestyle modifications to improve her current health and to decrease her risk of future health problems.  AGREE: Multiple dietary modification options and treatment options were discussed and Sally [name] agreed to follow the recommendations documented in the above note.  ARRANGE: Sally [name] was educated on the importance of frequent visits to treat obesity as outlined per CMS and USPSTF guidelines and agreed to schedule her next follow  up appointment today.  Attestation Statements:   Reviewed by clinician on day of visit: allergies, medications, problem list, medical history, surgical history, family history, social history, and previous encounter notes.  IMichaelene Song, am acting as Location manager for [employer], FNP   I have reviewed the above documentation for accuracy and completeness, and I agree with the above. -  Georgianne Fick, FNP

## 2020-01-13 LAB — COMPREHENSIVE METABOLIC PANEL
ALT: 14 IU/L (ref 0–32)
AST: 14 IU/L (ref 0–40)
Albumin/Globulin Ratio: 2 (ref 1.2–2.2)
Albumin: 4.5 g/dL (ref 3.8–4.9)
Alkaline Phosphatase: 84 IU/L (ref 48–121)
BUN/Creatinine Ratio: 30 — ABNORMAL HIGH (ref 12–28)
BUN: 19 mg/dL (ref 8–27)
Bilirubin Total: 0.3 mg/dL (ref 0.0–1.2)
CO2: 23 mmol/L (ref 20–29)
Calcium: 9.3 mg/dL (ref 8.7–10.3)
Chloride: 102 mmol/L (ref 96–106)
Creatinine, Ser: 0.64 mg/dL (ref 0.57–1.00)
GFR calc Af Amer: 112 mL/min/{1.73_m2} (ref >59)
GFR calc non Af Amer: 97 mL/min/{1.73_m2} (ref >59)
Globulin, Total: 2.3 g/dL (ref 1.5–4.5)
Glucose: 87 mg/dL (ref 65–99)
Potassium: 5 mmol/L (ref 3.5–5.2)
Sodium: 140 mmol/L (ref 134–144)
Total Protein: 6.8 g/dL (ref 6.0–8.5)

## 2020-01-13 LAB — HEMOGLOBIN A1C
Est. average glucose Bld gHb Est-mCnc: 120 mg/dL
Hgb A1c MFr Bld: 5.8 % — ABNORMAL HIGH (ref 4.8–5.6)

## 2020-01-13 LAB — VITAMIN D 25 HYDROXY (VIT D DEFICIENCY, FRACTURES): Vit D, 25-Hydroxy: 26.5 ng/mL — ABNORMAL LOW (ref 30.0–100.0)

## 2020-01-20 ENCOUNTER — Encounter (INDEPENDENT_AMBULATORY_CARE_PROVIDER_SITE_OTHER): Payer: Self-pay | Admitting: Family Medicine

## 2020-01-20 ENCOUNTER — Encounter: Payer: Self-pay | Admitting: Obstetrics and Gynecology

## 2020-01-29 ENCOUNTER — Encounter (INDEPENDENT_AMBULATORY_CARE_PROVIDER_SITE_OTHER): Payer: Self-pay

## 2020-02-03 ENCOUNTER — Ambulatory Visit (INDEPENDENT_AMBULATORY_CARE_PROVIDER_SITE_OTHER): Payer: Medicare Other | Admitting: Family Medicine

## 2020-02-03 ENCOUNTER — Encounter (INDEPENDENT_AMBULATORY_CARE_PROVIDER_SITE_OTHER): Payer: Self-pay | Admitting: Family Medicine

## 2020-02-03 ENCOUNTER — Other Ambulatory Visit: Payer: Self-pay

## 2020-02-03 VITALS — BP 125/78 | HR 83 | Temp 97.9°F | Ht 63.0 in | Wt 265.0 lb

## 2020-02-03 DIAGNOSIS — E559 Vitamin D deficiency, unspecified: Secondary | ICD-10-CM | POA: Diagnosis not present

## 2020-02-03 DIAGNOSIS — R110 Nausea: Secondary | ICD-10-CM | POA: Diagnosis not present

## 2020-02-03 DIAGNOSIS — Z6841 Body Mass Index (BMI) 40.0 and over, adult: Secondary | ICD-10-CM

## 2020-02-03 DIAGNOSIS — E1169 Type 2 diabetes mellitus with other specified complication: Secondary | ICD-10-CM | POA: Diagnosis not present

## 2020-02-03 DIAGNOSIS — Z9189 Other specified personal risk factors, not elsewhere classified: Secondary | ICD-10-CM | POA: Diagnosis not present

## 2020-02-03 MED ORDER — VITAMIN D (ERGOCALCIFEROL) 1.25 MG (50000 UNIT) PO CAPS: 50000.0000 [IU] | ORAL_CAPSULE | ORAL | 0 refills | Status: DC

## 2020-02-05 ENCOUNTER — Encounter (INDEPENDENT_AMBULATORY_CARE_PROVIDER_SITE_OTHER): Payer: Self-pay | Admitting: Family Medicine

## 2020-02-05 NOTE — Progress Notes (Signed)
Chief Complaint:   OBESITY Sally [name] is here to discuss her progress with her obesity treatment plan along with follow-up of her obesity related diagnoses. Sally [name] is on the Category 3 Plan and keeping a food journal and adhering to recommended goals of 450-600 calories and 40 grams of protein at supper daily and states she is following her eating plan approximately 75% of the time. Sally [name] states she is swimming, walking, and dancing for 120 minutes 2-3 times per week.  Today's visit was #: 33 Starting weight: 281 lbs Starting date: 05/14/2018 Today's weight: 265 lbs Today's date: 02/03/2020 Total lbs lost to date: 16 Total lbs lost since last in-office visit: 0  Interim History: Sally [name] is up 5 lbs of water weight per our bioimpedance scale. She notes being off the plan over the holiday and a recent beach trip.  Subjective:   1. Vitamin D deficiency Sally [name] has missed some doses of Vit D. Her last Vit D level has decreased rather than increased. It is now at 26.5. She is on Vit D every 3 days.   2. Type 2 diabetes mellitus without complication, without long-term current use of insulin (HCC) Sally [name]'s last A1c is down to 5.8 from 6.4. She is on metformin and Trulicity.  Lab Results  Component Value Date   HGBA1C 5.8 (H) 01/12/2020   HGBA1C 6.4 (H) 09/02/2019   HGBA1C 6.0 (H) 04/17/2019   Lab Results  Component Value Date   LDLCALC 83 09/02/2019   CREATININE 0.64 01/12/2020   Lab Results  Component Value Date   INSULIN 7.5 09/02/2019   INSULIN 4.2 04/17/2019   INSULIN 6.6 10/08/2018   INSULIN 9.8 05/14/2018   3. Nausea Sally [name] notes nausea when eating fatty foods. She notes right upper quadrant pain that radiates to her back.  4. At risk for hyperglycemia Sally [name] is at increased risk for hyperglycemia due to changes in diet, diagnosis of diabetes, and/or insulin use.   Assessment/Plan:   1. Vitamin D deficiency Low Vitamin D level contributes to fatigue and are  associated with obesity, breast, and colon cancer. We will refill prescription Vitamin D for 1 month. Sally [name] will follow-up for routine testing of Vitamin D, at least 2-3 times per year to avoid over-replacement. She will try to be more compliant wit taking the medication.  - Vitamin D, Ergocalciferol, (DRISDOL) 1.25 MG (50000 UNIT) CAPS capsule; Take 1 capsule (50,000 Units total) by mouth every 3 (three) days. On Friday  Dispense: 30 capsule; Refill: 0  2. Type 2 diabetes mellitus without complication, without long-term current use of insulin (HCC) . Sally [name] will continue metformin and Trulicity.  3. Nausea Sally [name] is to see her primary care physician for worsening symptoms. She still has her gallbladder.  It could be cholelithiasis/cholecystitis.  4. At risk for hyperglycemia Sally [name] was given approximately 15 minutes of counseling today regarding prevention of hyperglycemia. She was advised of hyperglycemia causes and the fact hyperglycemia is often asymptomatic. Sally [name] was instructed to avoid skipping meals, eat regular protein rich meals and schedule low calorie but protein rich snacks as needed.   Repetitive spaced learning was employed today to elicit superior memory formation and behavioral change  5. Class 3 severe obesity with serious comorbidity and body mass index (BMI) of 45.0 to 49.9 in adult, unspecified obesity type (HCC) Sally [name] is currently in the action stage of change. As such, her goal is to continue with weight loss efforts. She has agreed to the Category 3 Plan.  Exercise goals: As is.  Behavioral modification strategies: increasing lean protein intake, meal planning and cooking strategies and keeping healthy foods in the home.  Sally [name] has agreed to follow-up with our clinic in 3 weeks. She was informed of the importance of frequent follow-up visits to maximize her success with intensive lifestyle modifications for her multiple health conditions.   Objective:   Blood  pressure 125/78, pulse 83, temperature 97.9 F (36.6 C), temperature source Oral, height 5\' 3"  (1.6 m), weight 265 lb (120.2 kg), last menstrual period 05/31/2012, SpO2 96 %. Body mass index is 46.94 kg/m.  General: Cooperative, alert, well developed, in no acute distress. HEENT: Conjunctivae and lids unremarkable. Cardiovascular: Regular rhythm.  Lungs: Normal work of breathing. Neurologic: No focal deficits.   Lab Results  Component Value Date   CREATININE 0.64 01/12/2020   BUN 19 01/12/2020   NA 140 01/12/2020   K 5.0 01/12/2020   CL 102 01/12/2020   CO2 23 01/12/2020   Lab Results  Component Value Date   ALT 14 01/12/2020   AST 14 01/12/2020   ALKPHOS 84 01/12/2020   BILITOT 0.3 01/12/2020   Lab Results  Component Value Date   HGBA1C 5.8 (H) 01/12/2020   HGBA1C 6.4 (H) 09/02/2019   HGBA1C 6.0 (H) 04/17/2019   HGBA1C 6.6 (H) 10/08/2018   HGBA1C 6.8 (H) 05/14/2018   Lab Results  Component Value Date   INSULIN 7.5 09/02/2019   INSULIN 4.2 04/17/2019   INSULIN 6.6 10/08/2018   INSULIN 9.8 05/14/2018   Lab Results  Component Value Date   TSH 3.190 09/02/2019   Lab Results  Component Value Date   CHOL 163 09/02/2019   HDL 55 09/02/2019   LDLCALC 83 09/02/2019   TRIG 147 09/02/2019   Lab Results  Component Value Date   WBC 8.0 05/14/2018   HGB 12.0 05/14/2018   HCT 37.4 05/14/2018   MCV 94 05/14/2018   PLT 380 11/06/2017   No results found for: IRON, TIBC, FERRITIN  Attestation Statements:   Reviewed by clinician on day of visit: allergies, medications, problem list, medical history, surgical history, family history, social history, and previous encounter notes.   01/06/2018, am acting as Sally [name] for Energy manager, FNP-C.  I have reviewed the above documentation for accuracy and completeness, and I agree with the above. -  [place], FNP

## 2020-02-25 ENCOUNTER — Ambulatory Visit (INDEPENDENT_AMBULATORY_CARE_PROVIDER_SITE_OTHER): Payer: Medicare Other | Admitting: Family Medicine

## 2020-02-25 ENCOUNTER — Other Ambulatory Visit: Payer: Self-pay

## 2020-02-25 ENCOUNTER — Encounter (INDEPENDENT_AMBULATORY_CARE_PROVIDER_SITE_OTHER): Payer: Self-pay | Admitting: Family Medicine

## 2020-02-25 VITALS — BP 124/75 | HR 87 | Temp 98.4°F | Ht 63.0 in | Wt 261.0 lb

## 2020-02-25 DIAGNOSIS — R110 Nausea: Secondary | ICD-10-CM

## 2020-02-25 DIAGNOSIS — Z6841 Body Mass Index (BMI) 40.0 and over, adult: Secondary | ICD-10-CM

## 2020-02-25 NOTE — Progress Notes (Signed)
Chief Complaint:   OBESITY Sally [name] is here to discuss her progress with her obesity treatment plan along with follow-up of her obesity related diagnoses. Sally [name] is on the Category 3 Plan and states she is following her eating plan approximately 75-80% of the time. Sally [name] states she is swimming, walking, and dancing for 30-60 minutes 7 times per week.  Today's visit was #: 34 Starting weight: 281 lbs Starting date: 05/14/2018 Today's weight: 261 lbs Today's date: 02/25/2020 Total lbs lost to date: 20 Total lbs lost since last in-office visit: 4  Interim History: Sally [name] has increased her protein intake. She celebrated a birthday recently but she controlled her portions. She sometimes deviates from the plan when she is running errands, but she does focus on getting protein in.  Subjective:   1. Nausea Sally [name] has nausea but it has improved. It is caused when she eats greasy foods and she has this more frequently at night. She feels she had these issues before starting Trulicity. She still has her gallbladder.  Assessment/Plan:   1. Nausea We will continue to monitor. She will see her primary care physician for continued nausea.  2. Class 3 severe obesity with serious comorbidity and body mass index (BMI) of 45.0 to 49.9 in adult, unspecified obesity type (HCC) Sally [name] is currently in the action stage of change. As such, her goal is to continue with weight loss efforts. She has agreed to the Category 3 Plan.   Exercise goals: As is.  Behavioral modification strategies: increasing lean protein intake and decreasing simple carbohydrates.  Sally [name] has agreed to follow-up with our clinic in 3 weeks. She was informed of the importance of frequent follow-up visits to maximize her success with intensive lifestyle modifications for her multiple health conditions.   Objective:   Blood pressure 124/75, pulse 87, temperature 98.4 F (36.9 C), temperature source Oral, height 5\' 3"  (1.6 m),  weight (!) 261 lb (118.4 kg), last menstrual period 05/31/2012, SpO2 98 %. Body mass index is 46.23 kg/m.  General: Cooperative, alert, well developed, in no acute distress. HEENT: Conjunctivae and lids unremarkable. Cardiovascular: Regular rhythm.  Lungs: Normal work of breathing. Neurologic: No focal deficits.   Lab Results  Component Value Date   CREATININE 0.64 01/12/2020   BUN 19 01/12/2020   NA 140 01/12/2020   K 5.0 01/12/2020   CL 102 01/12/2020   CO2 23 01/12/2020   Lab Results  Component Value Date   ALT 14 01/12/2020   AST 14 01/12/2020   ALKPHOS 84 01/12/2020   BILITOT 0.3 01/12/2020   Lab Results  Component Value Date   HGBA1C 5.8 (H) 01/12/2020   HGBA1C 6.4 (H) 09/02/2019   HGBA1C 6.0 (H) 04/17/2019   HGBA1C 6.6 (H) 10/08/2018   HGBA1C 6.8 (H) 05/14/2018   Lab Results  Component Value Date   INSULIN 7.5 09/02/2019   INSULIN 4.2 04/17/2019   INSULIN 6.6 10/08/2018   INSULIN 9.8 05/14/2018   Lab Results  Component Value Date   TSH 3.190 09/02/2019   Lab Results  Component Value Date   CHOL 163 09/02/2019   HDL 55 09/02/2019   LDLCALC 83 09/02/2019   TRIG 147 09/02/2019   Lab Results  Component Value Date   WBC 8.0 05/14/2018   HGB 12.0 05/14/2018   HCT 37.4 05/14/2018   MCV 94 05/14/2018   PLT 380 11/06/2017   No results found for: IRON, TIBC, FERRITIN  Attestation Statements:   Reviewed by clinician on day  of visit: allergies, medications, problem list, medical history, surgical history, family history, social history, and previous encounter notes.   Wilhemena Durie, am acting as Location manager for [employer], FNP-C.  I have reviewed the above documentation for accuracy and completeness, and I agree with the above. -  Georgianne Fick, FNP

## 2020-03-17 ENCOUNTER — Ambulatory Visit (INDEPENDENT_AMBULATORY_CARE_PROVIDER_SITE_OTHER): Payer: Medicare Other | Admitting: Family Medicine

## 2020-03-17 ENCOUNTER — Other Ambulatory Visit: Payer: Self-pay

## 2020-03-17 ENCOUNTER — Encounter (INDEPENDENT_AMBULATORY_CARE_PROVIDER_SITE_OTHER): Payer: Self-pay | Admitting: Family Medicine

## 2020-03-17 VITALS — BP 115/74 | HR 79 | Temp 98.1°F | Ht 63.0 in | Wt 257.0 lb

## 2020-03-17 DIAGNOSIS — Z6841 Body Mass Index (BMI) 40.0 and over, adult: Secondary | ICD-10-CM | POA: Diagnosis not present

## 2020-03-17 DIAGNOSIS — E1169 Type 2 diabetes mellitus with other specified complication: Secondary | ICD-10-CM

## 2020-03-17 DIAGNOSIS — R110 Nausea: Secondary | ICD-10-CM | POA: Diagnosis not present

## 2020-03-17 NOTE — Progress Notes (Signed)
Chief Complaint:   OBESITY Sally [name] is here to discuss her progress with her obesity treatment plan along with follow-up of her obesity related diagnoses. Sally [name] is on the Category 3 Plan and states she is following her eating plan approximately 80% of the time. Sally [name] states she is walking and swimming for 30 minutes 5 times per week.  Today's visit was #: 35 Starting weight: 281 lbs Starting date: 05/14/2018 Today's weight: 257 lbs Today's date: 03/17/2020 Total lbs lost to date: 24 Total lbs lost since last in-office visit: 4  Interim History: Sally [name] feels she mostly eats all of the food on the plan. She reports being so busy it is hard to get all of the food in. She sometimes substitutes a protein bar or shake for lunch. She is remodeling her kitchen and she doesn't have a stove at home. She eats with her family who live close by.   Subjective:   1. Nausea Sally [name] notes her nausea has improved. She has had sporadic nausea recently when she eats greasy foods.  2. Type 2 diabetes mellitus with other specified complication, without long-term current use of insulin (HCC) Sally [name]'s diabetes mellitus is well controlled. Last A1c was 5.8 on 01/12/2020. She notes occasional nausea which predates start of Trulicity. She is not checking her CBGs at home.  Lab Results  Component Value Date   HGBA1C 5.8 (H) 01/12/2020   HGBA1C 6.4 (H) 09/02/2019   HGBA1C 6.0 (H) 04/17/2019   Lab Results  Component Value Date   LDLCALC 83 09/02/2019   CREATININE 0.64 01/12/2020   Lab Results  Component Value Date   INSULIN 7.5 09/02/2019   INSULIN 4.2 04/17/2019   INSULIN 6.6 10/08/2018   INSULIN 9.8 05/14/2018   Assessment/Plan:   1. Nausea Sally [name] will discuss with her primary care physician if nausea continues.  2. Type 2 diabetes mellitus with other specified complication, without long-term current use of insulin (HCC) Good blood sugar control is important to decrease the likelihood of  diabetic complications such as nephropathy, neuropathy, limb loss, blindness, coronary artery disease, and death. Intensive lifestyle modification including diet, exercise and weight loss are the first line of treatment for diabetes. Sally [name] will continue Trulicity and metformin, and will follow up as directed.  3. Class 3 severe obesity with serious comorbidity and body mass index (BMI) of 45.0 to 49.9 in adult, unspecified obesity type (HCC) Sally [name] is currently in the action stage of change. As such, her goal is to continue with weight loss efforts. She has agreed to the Category 3 Plan.   Exercise goals: As is.  Behavioral modification strategies: increasing lean protein intake and decreasing simple carbohydrates.  Sally [name] has agreed to follow-up with our clinic in 4 weeks. She was informed of the importance of frequent follow-up visits to maximize her success with intensive lifestyle modifications for her multiple health conditions.   Objective:   Blood pressure 115/74, pulse 79, temperature 98.1 F (36.7 C), temperature source Oral, height 5\' 3"  (1.6 m), weight 257 lb (116.6 kg), last menstrual period 05/31/2012, SpO2 98 %. Body mass index is 45.53 kg/m.  General: Cooperative, alert, well developed, in no acute distress. HEENT: Conjunctivae and lids unremarkable. Cardiovascular: Regular rhythm.  Lungs: Normal work of breathing. Neurologic: No focal deficits.  Musc: antalgic gait  Lab Results  Component Value Date   CREATININE 0.64 01/12/2020   BUN 19 01/12/2020   NA 140 01/12/2020   K 5.0 01/12/2020   CL 102 01/12/2020  CO2 23 01/12/2020   Lab Results  Component Value Date   ALT 14 01/12/2020   AST 14 01/12/2020   ALKPHOS 84 01/12/2020   BILITOT 0.3 01/12/2020   Lab Results  Component Value Date   HGBA1C 5.8 (H) 01/12/2020   HGBA1C 6.4 (H) 09/02/2019   HGBA1C 6.0 (H) 04/17/2019   HGBA1C 6.6 (H) 10/08/2018   HGBA1C 6.8 (H) 05/14/2018   Lab Results  Component Value  Date   INSULIN 7.5 09/02/2019   INSULIN 4.2 04/17/2019   INSULIN 6.6 10/08/2018   INSULIN 9.8 05/14/2018   Lab Results  Component Value Date   TSH 3.190 09/02/2019   Lab Results  Component Value Date   CHOL 163 09/02/2019   HDL 55 09/02/2019   LDLCALC 83 09/02/2019   TRIG 147 09/02/2019   Lab Results  Component Value Date   WBC 8.0 05/14/2018   HGB 12.0 05/14/2018   HCT 37.4 05/14/2018   MCV 94 05/14/2018   PLT 380 11/06/2017   No results found for: IRON, TIBC, FERRITIN  Obesity Behavioral Intervention Documentation for Insurance:   Approximately 15 minutes were spent on the discussion below.  ASK: We discussed the diagnosis of obesity with Sally [name] today and Sally [name] agreed to give [name] permission to discuss obesity behavioral modification therapy today.  ASSESS: Sally [name] has the diagnosis of obesity and her BMI today is 45.54. Sally [name] is in the action stage of change.   ADVISE: Sally [name] was educated on the multiple health risks of obesity as well as the benefit of weight loss to improve her health. She was advised of the need for long term treatment and the importance of lifestyle modifications to improve her current health and to decrease her risk of future health problems.  AGREE: Multiple dietary modification options and treatment options were discussed and Sally [name] agreed to follow the recommendations documented in the above note.  ARRANGE: Sally [name] was educated on the importance of frequent visits to treat obesity as outlined per CMS and USPSTF guidelines and agreed to schedule her next follow up appointment today.  Attestation Statements:   Reviewed by clinician on day of visit: allergies, medications, problem list, medical history, surgical history, family history, social history, and previous encounter notes.   Trude Mcburney, am acting as Energy manager for [place], FNP-C.  I have reviewed the above documentation for accuracy and completeness, and I agree  with the above. -  [name], FNP

## 2020-04-14 ENCOUNTER — Ambulatory Visit (INDEPENDENT_AMBULATORY_CARE_PROVIDER_SITE_OTHER): Payer: Medicare Other | Admitting: Family Medicine

## 2020-04-19 ENCOUNTER — Other Ambulatory Visit (INDEPENDENT_AMBULATORY_CARE_PROVIDER_SITE_OTHER): Payer: Self-pay | Admitting: Family Medicine

## 2020-04-19 DIAGNOSIS — E559 Vitamin D deficiency, unspecified: Secondary | ICD-10-CM

## 2020-04-26 ENCOUNTER — Other Ambulatory Visit: Payer: Self-pay

## 2020-04-26 ENCOUNTER — Ambulatory Visit (INDEPENDENT_AMBULATORY_CARE_PROVIDER_SITE_OTHER): Payer: Medicare Other | Admitting: Family Medicine

## 2020-04-26 ENCOUNTER — Encounter (INDEPENDENT_AMBULATORY_CARE_PROVIDER_SITE_OTHER): Payer: Self-pay | Admitting: Family Medicine

## 2020-04-26 VITALS — BP 120/73 | HR 78 | Temp 97.9°F | Ht 63.0 in | Wt 257.0 lb

## 2020-04-26 DIAGNOSIS — Z6841 Body Mass Index (BMI) 40.0 and over, adult: Secondary | ICD-10-CM | POA: Diagnosis not present

## 2020-04-26 DIAGNOSIS — E1169 Type 2 diabetes mellitus with other specified complication: Secondary | ICD-10-CM | POA: Diagnosis not present

## 2020-04-26 DIAGNOSIS — F3289 Other specified depressive episodes: Secondary | ICD-10-CM | POA: Diagnosis not present

## 2020-04-26 DIAGNOSIS — E559 Vitamin D deficiency, unspecified: Secondary | ICD-10-CM | POA: Diagnosis not present

## 2020-04-26 MED ORDER — VITAMIN D (ERGOCALCIFEROL) 1.25 MG (50000 UNIT) PO CAPS: 50000.0000 [IU] | ORAL_CAPSULE | ORAL | 0 refills | Status: DC

## 2020-04-26 MED ORDER — BUPROPION HCL ER (SR) 150 MG PO TB12: 150.0000 mg | ORAL_TABLET | Freq: Every day | ORAL | 0 refills | Status: DC

## 2020-04-26 MED ORDER — METFORMIN HCL ER 500 MG PO TB24: 500.0000 mg | ORAL_TABLET | Freq: Two times a day (BID) | ORAL | 0 refills | Status: DC

## 2020-04-26 NOTE — Progress Notes (Signed)
Chief Complaint:   OBESITY Sally [name] is here to discuss her progress with her obesity treatment plan along with follow-up of her obesity related diagnoses. Sally [name] is on the Category 3 Plan and states she is following her eating plan approximately 0% of the time. Sally [name] states she has increased walking.  Today's visit was #: 36 Starting weight: 281 lbs Starting date: 05/14/2018 Today's weight: 257 lbs Today's date: 04/26/2020 Total lbs lost to date: 24 Total lbs lost since last in-office visit: 0  Interim History: Sally [name] just got back from the beach and she is happy with weight maintenance. She is getting back on the plan today. She notes that she did eat plenty of protein on vacation and did quite a bit of walking.   Subjective:   1. Vitamin D deficiency Sally [name]'s last Vit D level was low at 26.5. She is on prescription Vit D every 3 days.  2. Type 2 diabetes mellitus without complication, without long-term current use of insulin (HCC) Sally [name]'s diabetes mellitus is well controlled on Trulicity and metformin. She does not check her CBGs at home.  Lab Results  Component Value Date   HGBA1C 5.8 (H) 01/12/2020   HGBA1C 6.4 (H) 09/02/2019   HGBA1C 6.0 (H) 04/17/2019   Lab Results  Component Value Date   LDLCALC 83 09/02/2019   CREATININE 0.64 01/12/2020   Lab Results  Component Value Date   INSULIN 7.5 09/02/2019   INSULIN 4.2 04/17/2019   INSULIN 6.6 10/08/2018   INSULIN 9.8 05/14/2018   3. Other depression, with emotional eating Sally [name] notes excessive food cravings. She also feels she lacks focus at times. She notes she feels a bit down. She denies suicidal or homicidal ideas.  Assessment/Plan:   1. Vitamin D deficiency Low Vitamin D level contributes to fatigue and are associated with obesity, breast, and colon cancer. We will refill prescription Vitamin D for 90 days, with no refill. - Vitamin D, Ergocalciferol, (DRISDOL) 1.25 MG (50000 UNIT) CAPS capsule; Take 1  capsule (50,000 Units total) by mouth every 3 (three) days. On Friday  Dispense: 30 capsule; Refill: 0  2. Type 2 diabetes mellitus without complication, without long-term current use of insulin (HCC)  We will refill metformin for 90 days, with no refill. Sally [name] is to check her CBGs fasting, a few times per week.  - metFORMIN (GLUCOPHAGE-XR) 500 MG 24 hr tablet; Take 1 tablet (500 mg total) by mouth 2 (two) times daily.  Dispense: 180 tablet; Refill: 0  3. Other depression, with emotional eating Behavior modification techniques were discussed today to help Sally [name] deal with her emotional/non-hunger eating behaviors. Sally [name] agreed to start bupropion 150 mg q AM #90 with no refill. Orders and follow up as documented in patient record.   - buPROPion (WELLBUTRIN SR) 150 MG 12 hr tablet; Take 1 tablet (150 mg total) by mouth daily.  Dispense: 90 tablet; Refill: 0 Patient has no history of seizures or glaucoma. Patient was informed of side effects.   4. Class 3 severe obesity with serious comorbidity and body mass index (BMI) of 45.0 to 49.9 in adult, unspecified obesity type (HCC) Sally [name] is currently in the action stage of change. As such, her goal is to continue with weight loss efforts. She has agreed to the Category 3 Plan.   Exercise goals: All adults should avoid inactivity. Some physical activity is better than none, and adults who participate in any amount of physical activity gain some health benefits.  Behavioral modification strategies:  increasing lean protein intake and decreasing simple carbohydrates.  Sally [name] has agreed to follow-up with our clinic in 3 weeks.  Objective:   Blood pressure 120/73, pulse 78, temperature 97.9 F (36.6 C), temperature source Oral, height 5\' 3"  (1.6 m), weight 257 lb (116.6 kg), last menstrual period 05/31/2012, SpO2 99 %. Body mass index is 45.53 kg/m.  General: Cooperative, alert, well developed, in no acute distress. HEENT: Conjunctivae and lids  unremarkable. Cardiovascular: Regular rhythm.  Lungs: Normal work of breathing. Neurologic: No focal deficits.   Lab Results  Component Value Date   CREATININE 0.64 01/12/2020   BUN 19 01/12/2020   NA 140 01/12/2020   K 5.0 01/12/2020   CL 102 01/12/2020   CO2 23 01/12/2020   Lab Results  Component Value Date   ALT 14 01/12/2020   AST 14 01/12/2020   ALKPHOS 84 01/12/2020   BILITOT 0.3 01/12/2020   Lab Results  Component Value Date   HGBA1C 5.8 (H) 01/12/2020   HGBA1C 6.4 (H) 09/02/2019   HGBA1C 6.0 (H) 04/17/2019   HGBA1C 6.6 (H) 10/08/2018   HGBA1C 6.8 (H) 05/14/2018   Lab Results  Component Value Date   INSULIN 7.5 09/02/2019   INSULIN 4.2 04/17/2019   INSULIN 6.6 10/08/2018   INSULIN 9.8 05/14/2018   Lab Results  Component Value Date   TSH 3.190 09/02/2019   Lab Results  Component Value Date   CHOL 163 09/02/2019   HDL 55 09/02/2019   LDLCALC 83 09/02/2019   TRIG 147 09/02/2019   Lab Results  Component Value Date   WBC 8.0 05/14/2018   HGB 12.0 05/14/2018   HCT 37.4 05/14/2018   MCV 94 05/14/2018   PLT 380 11/06/2017   No results found for: IRON, TIBC, FERRITIN  Obesity Behavioral Intervention:   Approximately 15 minutes were spent on the discussion below.  ASK: We discussed the diagnosis of obesity with 01/06/2018 today and Sally [name] agreed to give Sally [name] permission to discuss obesity behavioral modification therapy today.  ASSESS: Sally [name] has the diagnosis of obesity and her BMI today is 45.54. Sally [name] is in the action stage of change.   ADVISE: Sally [name] was educated on the multiple health risks of obesity as well as the benefit of weight loss to improve her health. She was advised of the need for long term treatment and the importance of lifestyle modifications to improve her current health and to decrease her risk of future health problems.  AGREE: Multiple dietary modification options and treatment options were discussed and Sally [name] agreed to follow the  recommendations documented in the above note.  ARRANGE: Sally [name] was educated on the importance of frequent visits to treat obesity as outlined per CMS and USPSTF guidelines and agreed to schedule her next follow up appointment today.  Attestation Statements:   Reviewed by clinician on day of visit: allergies, medications, problem list, medical history, surgical history, family history, social history, and previous encounter notes.   Sally [name], am acting as Sally [name] for Energy manager, FNP-C.  I have reviewed the above documentation for accuracy and completeness, and I agree with the above. -  [place], FNP

## 2020-04-27 ENCOUNTER — Encounter (INDEPENDENT_AMBULATORY_CARE_PROVIDER_SITE_OTHER): Payer: Self-pay | Admitting: Family Medicine

## 2020-04-27 ENCOUNTER — Other Ambulatory Visit (INDEPENDENT_AMBULATORY_CARE_PROVIDER_SITE_OTHER): Payer: Self-pay

## 2020-04-27 DIAGNOSIS — F418 Other specified anxiety disorders: Secondary | ICD-10-CM | POA: Insufficient documentation

## 2020-04-27 DIAGNOSIS — F32A Depression, unspecified: Secondary | ICD-10-CM | POA: Insufficient documentation

## 2020-04-27 DIAGNOSIS — E559 Vitamin D deficiency, unspecified: Secondary | ICD-10-CM

## 2020-04-27 MED ORDER — VITAMIN D (ERGOCALCIFEROL) 1.25 MG (50000 UNIT) PO CAPS: 50000.0000 [IU] | ORAL_CAPSULE | ORAL | 0 refills | Status: DC

## 2020-05-18 ENCOUNTER — Encounter (INDEPENDENT_AMBULATORY_CARE_PROVIDER_SITE_OTHER): Payer: Self-pay | Admitting: Family Medicine

## 2020-05-18 ENCOUNTER — Other Ambulatory Visit: Payer: Self-pay

## 2020-05-18 ENCOUNTER — Ambulatory Visit (INDEPENDENT_AMBULATORY_CARE_PROVIDER_SITE_OTHER): Payer: Medicare Other | Admitting: Family Medicine

## 2020-05-18 VITALS — BP 134/81 | HR 88 | Temp 98.2°F | Ht 63.0 in | Wt 254.0 lb

## 2020-05-18 DIAGNOSIS — Z6841 Body Mass Index (BMI) 40.0 and over, adult: Secondary | ICD-10-CM

## 2020-05-18 DIAGNOSIS — F3289 Other specified depressive episodes: Secondary | ICD-10-CM

## 2020-05-18 DIAGNOSIS — E559 Vitamin D deficiency, unspecified: Secondary | ICD-10-CM

## 2020-05-18 MED ORDER — BUPROPION HCL ER (SR) 150 MG PO TB12: 150.0000 mg | ORAL_TABLET | Freq: Two times a day (BID) | ORAL | 0 refills | Status: DC

## 2020-05-18 MED ORDER — VITAMIN D (ERGOCALCIFEROL) 1.25 MG (50000 UNIT) PO CAPS: 50000.0000 [IU] | ORAL_CAPSULE | ORAL | 0 refills | Status: DC

## 2020-05-19 ENCOUNTER — Encounter (INDEPENDENT_AMBULATORY_CARE_PROVIDER_SITE_OTHER): Payer: Self-pay | Admitting: Family Medicine

## 2020-05-19 NOTE — Progress Notes (Signed)
Chief Complaint:   OBESITY Sally [name] is here to discuss her progress with her obesity treatment plan along with follow-up of her obesity related diagnoses. [name] is on the Category 3 Plan and states she is following her eating plan approximately 50% of the time. 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  Today's visit was #: 37 Starting weight: 281 lbs Starting date: 05/14/2018 Today's weight: 254 lbs Today's date: 05/18/2020 Total lbs lost to date: 27 Total lbs lost since last in-office visit: 3  Interim History: Sally [name] says it is hard getting back on track after her vacation. [place]  She has lost 3 lbs since her lost OV.She has a protein shake in the a.m. sometimes to save time.  Subjective:   Other depression, with emotional eating.  [name] feels bupropion is helping with cravings but she still has significant cravings at night.  Vitamin D deficiency. Last Vitamin D level on 01/12/2020 was 26.5. [name] is on prescription Vitamin D every 3 days.   Ref. Range 01/12/2020 13:14  Vitamin D, 25-Hydroxy Latest Ref Range: 30.0 - 100.0 ng/mL 26.5 (L)   Assessment/Plan:   Other depression, with emotional eating.  Increase her dose of buPROPion (WELLBUTRIN SR) to 150 MG 12 hr tablet BID #60 with 0 refills.  Vitamin D deficiency.  She was given a refill on her Vitamin D, Ergocalciferol, (DRISDOL) 1.25 MG (50000 UNIT) CAPS capsule every 3 days #30.  Class 3 severe obesity with serious comorbidity and body mass index (BMI) of 45.0 to 49.9 in adult, unspecified obesity type (HCC).  Sally [name] is currently in the action stage of change. As such, her goal is to continue with weight loss efforts. She has agreed to the Category 3 Plan or will journal 1400-1500 calories and 90 grams of protein daily.   Handouts were provided on the Category 3 meal plan and Recipes.  Exercise goals: All adults should avoid inactivity. Some physical activity is better than none,  and adults who participate in any amount of physical activity gain some health benefits.  Behavioral modification strategies: increasing lean protein intake, decreasing simple carbohydrates and meal planning and cooking strategies.  [name] has agreed to follow-up with our clinic in 3 weeks.  Objective:   Blood pressure 134/81, pulse 88, temperature 98.2 F (36.8 C), height 5\' 3"  (1.6 m), weight 254 lb (115.2 kg), last menstrual period 05/31/2012, SpO2 99 %. Body mass index is 44.99 kg/m.  General: Cooperative, alert, well developed, in no acute distress. HEENT: Conjunctivae and lids unremarkable. Cardiovascular: Regular rhythm.  Lungs: Normal work of breathing. Neurologic: No focal deficits.   Lab Results  Component Value Date   CREATININE 0.64 01/12/2020   BUN 19 01/12/2020   NA 140 01/12/2020   K 5.0 01/12/2020   CL 102 01/12/2020   CO2 23 01/12/2020   Lab Results  Component Value Date   ALT 14 01/12/2020   AST 14 01/12/2020   ALKPHOS 84 01/12/2020   BILITOT 0.3 01/12/2020   Lab Results  Component Value Date   HGBA1C 5.8 (H) 01/12/2020   HGBA1C 6.4 (H) 09/02/2019   HGBA1C 6.0 (H) 04/17/2019   HGBA1C 6.6 (H) 10/08/2018   HGBA1C 6.8 (H) 05/14/2018   Lab Results  Component Value Date   INSULIN 7.5 09/02/2019   INSULIN 4.2 04/17/2019   INSULIN 6.6 10/08/2018   INSULIN 9.8 05/14/2018   Lab Results  Component Value Date   TSH 3.190 09/02/2019  Lab Results  Component Value Date   CHOL 163 09/02/2019   HDL 55 09/02/2019   LDLCALC 83 09/02/2019   TRIG 147 09/02/2019   Lab Results  Component Value Date   WBC 8.0 05/14/2018   HGB 12.0 05/14/2018   HCT 37.4 05/14/2018   MCV 94 05/14/2018   PLT 380 11/06/2017   No results found for: IRON, TIBC, FERRITIN  Obesity Behavioral Intervention:   Approximately 15 minutes were spent on the discussion below.  ASK: We discussed the diagnosis of obesity with [name] today and [name] agreed to give [name] permission to  discuss obesity behavioral modification therapy today.  ASSESS: Sally [name] has the diagnosis of obesity and her BMI today is 45.1. [name] is in the action stage of change.   ADVISE: Darolyn was educated on the multiple health risks of obesity as well as the benefit of weight loss to improve her health. She was advised of the need for long term treatment and the importance of lifestyle modifications to improve her current health and to decrease her risk of future health problems.  AGREE: Multiple dietary modification options and treatment options were discussed and Sally [name] agreed to follow the recommendations documented in the above note.  ARRANGE: [name] was educated on the importance of frequent visits to treat obesity as outlined per CMS and USPSTF guidelines and agreed to schedule her next follow up appointment today.  Attestation Statements:   Reviewed by clinician on day of visit: allergies, medications, problem list, medical history, surgical history, family history, social history, and previous encounter notes.  [name], am acting as Energy manager for [place], FNP-C   I have reviewed the above documentation for accuracy and completeness, and I agree with the above. -  [name], FNP

## 2020-06-07 ENCOUNTER — Other Ambulatory Visit: Payer: Self-pay

## 2020-06-07 ENCOUNTER — Ambulatory Visit (INDEPENDENT_AMBULATORY_CARE_PROVIDER_SITE_OTHER): Payer: Medicare Other | Admitting: Family Medicine

## 2020-06-07 ENCOUNTER — Encounter (INDEPENDENT_AMBULATORY_CARE_PROVIDER_SITE_OTHER): Payer: Self-pay | Admitting: Family Medicine

## 2020-06-07 VITALS — BP 130/79 | HR 86 | Temp 98.2°F | Ht 63.0 in | Wt 252.0 lb

## 2020-06-07 DIAGNOSIS — Z6841 Body Mass Index (BMI) 40.0 and over, adult: Secondary | ICD-10-CM | POA: Diagnosis not present

## 2020-06-07 DIAGNOSIS — F3289 Other specified depressive episodes: Secondary | ICD-10-CM

## 2020-06-07 DIAGNOSIS — E1169 Type 2 diabetes mellitus with other specified complication: Secondary | ICD-10-CM

## 2020-06-08 ENCOUNTER — Telehealth (INDEPENDENT_AMBULATORY_CARE_PROVIDER_SITE_OTHER): Payer: Self-pay

## 2020-06-08 NOTE — Progress Notes (Signed)
Chief Complaint:   OBESITY Skyanne is here to discuss her progress with her obesity treatment plan along with follow-up of her obesity related diagnoses. Kaiden is on the Category 3 Plan and states she is following her eating plan approximately 80% of the time. Audreanna states she is walking for 30 minutes 5 times per week.  Today's visit was #: 38 Starting weight: 281 lbs Starting date: 05/14/2018 Today's weight: 252 lbs Today's date: 06/07/2020 Total lbs lost to date: 29 Total lbs lost since last in-office visit: 2  Interim History: Shanie notes she is eating smaller portions and she is satisfied with less food. She is now down 46 lbs overall since fall 2019. She needs left total knee replacement surgery but she needs to lose down to 230 lbs to be a candidate.  Subjective:   1. Type 2 diabetes mellitus with other specified complication, without long-term current use of insulin (HCC) Deanie's diabetes mellitus is well controlled on Trulicity and metformin. She is not checking her sugars at home. Last A1c was 5.7 at her primary care physician's office recently.  Lab Results  Component Value Date   HGBA1C 5.8 (H) 01/12/2020   HGBA1C 6.4 (H) 09/02/2019   HGBA1C 6.0 (H) 04/17/2019   Lab Results  Component Value Date   LDLCALC 83 09/02/2019   CREATININE 0.64 01/12/2020   Lab Results  Component Value Date   INSULIN 7.5 09/02/2019   INSULIN 4.2 04/17/2019   INSULIN 6.6 10/08/2018   INSULIN 9.8 05/14/2018   2. Other depression, with emotional eating Eliannah notes her cravings are better controlled with bupropion 150 mg BID.  Assessment/Plan:   1. Type 2 diabetes mellitus with other specified complication, without long-term current use of insulin (HCC) . Aira will continue her current medications (Trulicity and metformin). I signed her Trulicity form for free drug from [facility].  2. Other depression, with emotional eating  Kirsty will continue bupropion.   3. Class 3 severe  obesity with serious comorbidity and body mass index (BMI) of 40.0 to 44.9 in adult, unspecified obesity type (HCC) Makenley is currently in the action stage of change. As such, her goal is to continue with weight loss efforts. She has agreed to the Category 3 Plan.   Exercise goals: As is.  Behavioral modification strategies: holiday eating strategies .  Jennife has agreed to follow-up with our clinic in 3 weeks. She was informed of the importance of frequent follow-up visits to maximize her success with intensive lifestyle modifications for her multiple health conditions.   Objective:   Blood pressure 130/79, pulse 86, temperature 98.2 F (36.8 C), height 5\' 3"  (1.6 m), weight 252 lb (114.3 kg), last menstrual period 05/31/2012, SpO2 98 %. Body mass index is 44.64 kg/m.  General: Cooperative, alert, well developed, in no acute distress. HEENT: Conjunctivae and lids unremarkable. Cardiovascular: Regular rhythm.  Lungs: Normal work of breathing. Neurologic: No focal deficits.   Lab Results  Component Value Date   CREATININE 0.64 01/12/2020   BUN 19 01/12/2020   NA 140 01/12/2020   K 5.0 01/12/2020   CL 102 01/12/2020   CO2 23 01/12/2020   Lab Results  Component Value Date   ALT 14 01/12/2020   AST 14 01/12/2020   ALKPHOS 84 01/12/2020   BILITOT 0.3 01/12/2020   Lab Results  Component Value Date   HGBA1C 5.8 (H) 01/12/2020   HGBA1C 6.4 (H) 09/02/2019   HGBA1C 6.0 (H) 04/17/2019   HGBA1C 6.6 (H) 10/08/2018  HGBA1C 6.8 (H) 05/14/2018   Lab Results  Component Value Date   INSULIN 7.5 09/02/2019   INSULIN 4.2 04/17/2019   INSULIN 6.6 10/08/2018   INSULIN 9.8 05/14/2018   Lab Results  Component Value Date   TSH 3.190 09/02/2019   Lab Results  Component Value Date   CHOL 163 09/02/2019   HDL 55 09/02/2019   LDLCALC 83 09/02/2019   TRIG 147 09/02/2019   Lab Results  Component Value Date   WBC 8.0 05/14/2018   HGB 12.0 05/14/2018   HCT 37.4 05/14/2018   MCV  94 05/14/2018   PLT 380 11/06/2017   No results found for: IRON, TIBC, FERRITIN  Obesity Behavioral Intervention:   Approximately 15 minutes were spent on the discussion below.  ASK: We discussed the diagnosis of obesity with [name] today and Uriah agreed to give [name] permission to discuss obesity behavioral modification therapy today.  ASSESS: Raejean has the diagnosis of obesity and her BMI today is 44.65. Jaynell is in the action stage of change.   ADVISE: Carnell was educated on the multiple health risks of obesity as well as the benefit of weight loss to improve her health. She was advised of the need for long term treatment and the importance of lifestyle modifications to improve her current health and to decrease her risk of future health problems.  AGREE: Multiple dietary modification options and treatment options were discussed and Shacola agreed to follow the recommendations documented in the above note.  ARRANGE: Myiah was educated on the importance of frequent visits to treat obesity as outlined per CMS and USPSTF guidelines and agreed to schedule her next follow up appointment today.  Attestation Statements:   Reviewed by clinician on day of visit: allergies, medications, problem list, medical history, surgical history, family history, social history, and previous encounter notes.   Trude Mcburney, am acting as Energy manager for [place], FNP-C.  I have reviewed the above documentation for accuracy and completeness, and I agree with the above. -  [name], FNP

## 2020-06-08 NOTE — Telephone Encounter (Signed)
During pt's visit yesterday, a refill form for Trulicity 1.5mg  once weekly injection was completed and signs by provider.  It was faxed twice on 06/07/20. No fax confirmation was received and faxed a third time on 06/08/20.

## 2020-06-09 ENCOUNTER — Encounter (INDEPENDENT_AMBULATORY_CARE_PROVIDER_SITE_OTHER): Payer: Self-pay | Admitting: Family Medicine

## 2020-06-18 ENCOUNTER — Encounter (INDEPENDENT_AMBULATORY_CARE_PROVIDER_SITE_OTHER): Payer: Self-pay | Admitting: Family Medicine

## 2020-06-21 NOTE — Telephone Encounter (Signed)
Last OV with Dawn 

## 2020-06-22 ENCOUNTER — Telehealth (INDEPENDENT_AMBULATORY_CARE_PROVIDER_SITE_OTHER): Payer: Medicare Other | Admitting: Family Medicine

## 2020-06-22 ENCOUNTER — Encounter (INDEPENDENT_AMBULATORY_CARE_PROVIDER_SITE_OTHER): Payer: Self-pay | Admitting: Family Medicine

## 2020-06-22 ENCOUNTER — Other Ambulatory Visit: Payer: Self-pay

## 2020-06-22 VITALS — BP 171/110 | Ht 63.0 in

## 2020-06-22 DIAGNOSIS — R030 Elevated blood-pressure reading, without diagnosis of hypertension: Secondary | ICD-10-CM | POA: Diagnosis not present

## 2020-06-22 DIAGNOSIS — E559 Vitamin D deficiency, unspecified: Secondary | ICD-10-CM

## 2020-06-22 DIAGNOSIS — Z6841 Body Mass Index (BMI) 40.0 and over, adult: Secondary | ICD-10-CM

## 2020-06-22 DIAGNOSIS — E1169 Type 2 diabetes mellitus with other specified complication: Secondary | ICD-10-CM | POA: Diagnosis not present

## 2020-06-22 DIAGNOSIS — U071 COVID-19: Secondary | ICD-10-CM

## 2020-06-22 NOTE — Progress Notes (Signed)
Pt states B/P on home cuff is 171/110. Pt denies H/A,dizziness,visual disturbances, SOB, or chest pain. Pt stated she does not believe her cuff is working properly. Pt was again advised to contact PCP to see how they would like for her to proceed. PT verbalized understanding.

## 2020-06-23 NOTE — Progress Notes (Signed)
TeleHealth Visit:  Due to the COVID-19 pandemic, this visit was completed with telemedicine (audio/video) technology to reduce patient and provider exposure as well as to preserve personal protective equipment.   Sally [name] has verbally consented to this TeleHealth visit. The patient is located at home, the provider is located at the [facility] office. The participants in this visit include the listed provider and patient. The visit was conducted today via MyChart video.   Chief Complaint: OBESITY Sally [name] is here to discuss her progress with her obesity treatment plan along with follow-up of her obesity related diagnoses. Sally [name] is on the Category 3 Plan and states she is following her eating plan approximately 0% of the time. Sally [name] states she is doing 0 minutes 0 times per week.  Today's visit was #: 39 Starting weight: 281 lbs Starting date: 05/14/2018  Interim History: Sally [name] has had COVID over the past few weeks, she was diagnosed on November 11th. She reports feeling the worst she had ever felt. She had not been vaccinated but plans to when she is able. She has lost 3-4 lbs due to being ill. Her appetite is improving, but her taste is "off".   Subjective:   1. Elevated blood pressure reading without diagnosis of hypertension Sally [name]'s blood pressure was 171/110 this morning at her first check at home. Subsequent readings are better,137/88, then down to 121/88. She had been ambulating earlier and feels this is why BP was high. She is not fully recovered from recent bout with COVID and is still getting short of breath easily.   BP Readings from Last 3 Encounters:  06/22/20 (!) 171/110  06/07/20 130/79  05/18/20 134/81   Lab Results  Component Value Date   CREATININE 0.64 01/12/2020   CREATININE 0.61 09/02/2019   CREATININE 0.67 04/17/2019   2. Type 2 diabetes mellitus with other specified complication, without long-term current use of insulin (HCC) Sally [name] has been  off of all her medications while sick. She restarted all her medications today. Her diabetes is well controlled on Trulicity 1.5 mg and metformin XR 500 mg q daily.  Lab Results  Component Value Date   HGBA1C 5.8 (H) 01/12/2020   HGBA1C 6.4 (H) 09/02/2019   HGBA1C 6.0 (H) 04/17/2019   Lab Results  Component Value Date   LDLCALC 83 09/02/2019   CREATININE 0.64 01/12/2020   Lab Results  Component Value Date   INSULIN 7.5 09/02/2019   INSULIN 4.2 04/17/2019   INSULIN 6.6 10/08/2018   INSULIN 9.8 05/14/2018   3. Vitamin D deficiency Sally [name]'s last Vit D level was low at 26.5. She is on prescription Vit D every 3 days.  Assessment/Plan:   1. Elevated blood pressure reading without diagnosis of hypertension Sally [name] is working on healthy weight loss and exercise to improve blood pressure control. We will continue to monitor, and will watch for signs of hypotension as she continues her lifestyle modifications.   2. Type 2 diabetes mellitus with other specified complication, without long-term current use of insulin (HCC)  Sally [name] will continue all her medications.   3. Vitamin D deficiency  Sally [name] agreed to continue taking prescription Vitamin D 50,000 IU every 3 days and will follow-up for routine testing of Vitamin D, at least 2-3 times per year to avoid over-replacement. We will recheck labs at her next office visit.  4. Class 3 severe obesity with serious comorbidity and body mass index (BMI) of 40.0 to 44.9 in adult, unspecified obesity type (HCC) Sally [name] is  currently in the action stage of change. As such, her goal is to continue with weight loss efforts. She has agreed to the Category 3 Plan.   Exercise goals: No exercise has been prescribed at this time.  Behavioral modification strategies: Sally [name] will increase lean protein intake and get back on plan as she continues to recover from COVID.  Sally [name] has agreed to follow-up with our clinic in 2 to 3 weeks.   Objective:   VITALS: Per  patient if applicable, see vitals. GENERAL: Alert and in no acute distress. CARDIOPULMONARY: No increased WOB. Speaking in clear sentences.  PSYCH: Pleasant and cooperative. Speech normal rate and rhythm. Affect is appropriate. Insight and judgement are appropriate. Attention is focused, linear, and appropriate.  NEURO: Oriented as arrived to appointment on time with no prompting.   Lab Results  Component Value Date   CREATININE 0.64 01/12/2020   BUN 19 01/12/2020   NA 140 01/12/2020   K 5.0 01/12/2020   CL 102 01/12/2020   CO2 23 01/12/2020   Lab Results  Component Value Date   ALT 14 01/12/2020   AST 14 01/12/2020   ALKPHOS 84 01/12/2020   BILITOT 0.3 01/12/2020   Lab Results  Component Value Date   HGBA1C 5.8 (H) 01/12/2020   HGBA1C 6.4 (H) 09/02/2019   HGBA1C 6.0 (H) 04/17/2019   HGBA1C 6.6 (H) 10/08/2018   HGBA1C 6.8 (H) 05/14/2018   Lab Results  Component Value Date   INSULIN 7.5 09/02/2019   INSULIN 4.2 04/17/2019   INSULIN 6.6 10/08/2018   INSULIN 9.8 05/14/2018   Lab Results  Component Value Date   TSH 3.190 09/02/2019   Lab Results  Component Value Date   CHOL 163 09/02/2019   HDL 55 09/02/2019   LDLCALC 83 09/02/2019   TRIG 147 09/02/2019   Lab Results  Component Value Date   WBC 8.0 05/14/2018   HGB 12.0 05/14/2018   HCT 37.4 05/14/2018   MCV 94 05/14/2018   PLT 380 11/06/2017   No results found for: IRON, TIBC, FERRITIN  Attestation Statements:   Reviewed by clinician on day of visit: allergies, medications, problem list, medical history, surgical history, family history, social history, and previous encounter notes.   Trude Mcburney, am acting as Energy manager for [place], FNP-C.  I have reviewed the above documentation for accuracy and completeness, and I agree with the above. - [name], FNP

## 2020-06-24 ENCOUNTER — Encounter (INDEPENDENT_AMBULATORY_CARE_PROVIDER_SITE_OTHER): Payer: Self-pay | Admitting: Family Medicine

## 2020-06-24 DIAGNOSIS — R030 Elevated blood-pressure reading, without diagnosis of hypertension: Secondary | ICD-10-CM | POA: Insufficient documentation

## 2020-07-13 ENCOUNTER — Ambulatory Visit (INDEPENDENT_AMBULATORY_CARE_PROVIDER_SITE_OTHER): Payer: Medicare Other | Admitting: Family Medicine

## 2020-07-14 ENCOUNTER — Other Ambulatory Visit: Payer: Self-pay

## 2020-07-14 ENCOUNTER — Ambulatory Visit (INDEPENDENT_AMBULATORY_CARE_PROVIDER_SITE_OTHER): Payer: Medicare Other | Admitting: Family Medicine

## 2020-07-14 ENCOUNTER — Encounter (INDEPENDENT_AMBULATORY_CARE_PROVIDER_SITE_OTHER): Payer: Self-pay | Admitting: Family Medicine

## 2020-07-14 VITALS — BP 121/79 | HR 86 | Temp 97.8°F | Ht 63.0 in | Wt 248.0 lb

## 2020-07-14 DIAGNOSIS — E1165 Type 2 diabetes mellitus with hyperglycemia: Secondary | ICD-10-CM

## 2020-07-14 DIAGNOSIS — E559 Vitamin D deficiency, unspecified: Secondary | ICD-10-CM | POA: Diagnosis not present

## 2020-07-14 MED ORDER — VITAMIN D (ERGOCALCIFEROL) 1.25 MG (50000 UNIT) PO CAPS: 50000.0000 [IU] | ORAL_CAPSULE | ORAL | 0 refills | Status: DC

## 2020-07-19 NOTE — Progress Notes (Signed)
Chief Complaint:   OBESITY Sally [name] is here to discuss her progress with her obesity treatment plan along with follow-up of her obesity related diagnoses. [name] is on the Category 3 Plan and states she is following her eating plan approximately 0% of the time. [name] states she is doing 0 minutes 0 times per week.  Today's visit was #: 40 Starting weight: 281 lbs Starting date: 05/14/2018 Today's weight: 248 lbs Today's date: 07/14/2020 Total lbs lost to date: 33 Total lbs lost since last in-office visit: 4  Interim History: [name] had COVID last month and she voice she lost 2 weeks of her life. She didn't eat much when she was sick but she ate quite a bit after recovering. She is getting together with her family for Christmas. She wants to get a bit more on track over the next few weeks.  Subjective:   1. Vitamin D deficiency Lavell denies nausea, vomiting, or muscle weakness, but she notes fatigue. She is on prescription Vit D. Last Vit D level was 26.5.  2. Type 2 diabetes mellitus with hyperglycemia, without long-term current use of insulin (HCC) [name] is on Trulicity and metformin. She had 1 episode of hypoglycemia. Last A1c was 5.8.  Assessment/Plan:   1. Vitamin D deficiency Low Vitamin D level contributes to fatigue and are associated with obesity, breast, and colon cancer. We will refill prescription Vitamin D for 1 month. [name] will follow-up for routine testing of Vitamin D, at least 2-3 times per year to avoid over-replacement.  - Vitamin D, Ergocalciferol, (DRISDOL) 1.25 MG (50000 UNIT) CAPS capsule; Take 1 capsule (50,000 Units total) by mouth every 7 (seven) days.  Dispense: 4 capsule; Refill: 0  2. Type 2 diabetes mellitus with hyperglycemia, without long-term current use of insulin (HCC) Good blood sugar control is important to decrease the likelihood of diabetic complications such as nephropathy, neuropathy, limb loss, blindness, coronary artery disease, and  death. Intensive lifestyle modification including diet, exercise and weight loss are the first line of treatment for diabetes. [name] will continue her current dosage of metformin and Trulicity.  3. Class 3 severe obesity with serious comorbidity and body mass index (BMI) of 40.0 to 44.9 in adult, unspecified obesity type (HCC) [name] is currently in the action stage of change. As such, her goal is to continue with weight loss efforts. She has agreed to the Category 3 Plan.   Exercise goals: No exercise has been prescribed at this time.  Behavioral modification strategies: increasing lean protein intake, meal planning and cooking strategies, keeping healthy foods in the home, holiday eating strategies  and celebration eating strategies.  [name] has agreed to follow-up with our clinic in 3 to 4 weeks. She was informed of the importance of frequent follow-up visits to maximize her success with intensive lifestyle modifications for her multiple health conditions.   Objective:   Blood pressure 121/79, pulse 86, temperature 97.8 F (36.6 C), temperature source Oral, height 5\' 3"  (1.6 m), weight 248 lb (112.5 kg), last menstrual period 05/31/2012, SpO2 98 %. Body mass index is 43.93 kg/m.  General: Cooperative, alert, well developed, in no acute distress. HEENT: Conjunctivae and lids unremarkable. Cardiovascular: Regular rhythm.  Lungs: Normal work of breathing. Neurologic: No focal deficits.   Lab Results  Component Value Date   CREATININE 0.64 01/12/2020   BUN 19 01/12/2020   NA 140 01/12/2020   K 5.0 01/12/2020   CL 102 01/12/2020   CO2 23 01/12/2020   Lab Results  Component Value Date   ALT 14 01/12/2020   AST 14 01/12/2020   ALKPHOS 84 01/12/2020   BILITOT 0.3 01/12/2020   Lab Results  Component Value Date   HGBA1C 5.8 (H) 01/12/2020   HGBA1C 6.4 (H) 09/02/2019   HGBA1C 6.0 (H) 04/17/2019   HGBA1C 6.6 (H) 10/08/2018   HGBA1C 6.8 (H) 05/14/2018   Lab Results  Component  Value Date   INSULIN 7.5 09/02/2019   INSULIN 4.2 04/17/2019   INSULIN 6.6 10/08/2018   INSULIN 9.8 05/14/2018   Lab Results  Component Value Date   TSH 3.190 09/02/2019   Lab Results  Component Value Date   CHOL 163 09/02/2019   HDL 55 09/02/2019   LDLCALC 83 09/02/2019   TRIG 147 09/02/2019   Lab Results  Component Value Date   WBC 8.0 05/14/2018   HGB 12.0 05/14/2018   HCT 37.4 05/14/2018   MCV 94 05/14/2018   PLT 380 11/06/2017   No results found for: IRON, TIBC, FERRITIN  Obesity Behavioral Intervention:   Approximately 15 minutes were spent on the discussion below.  ASK: We discussed the diagnosis of obesity with [name] today and [name] agreed to give [name] permission to discuss obesity behavioral modification therapy today.  ASSESS: [name] has the diagnosis of obesity and her BMI today is 43.94. [name] is in the action stage of change.   ADVISE: [name] was educated on the multiple health risks of obesity as well as the benefit of weight loss to improve her health. She was advised of the need for long term treatment and the importance of lifestyle modifications to improve her current health and to decrease her risk of future health problems.  AGREE: Multiple dietary modification options and treatment options were discussed and [name] agreed to follow the recommendations documented in the above note.  ARRANGE: [name] was educated on the importance of frequent visits to treat obesity as outlined per CMS and USPSTF guidelines and agreed to schedule her next follow up appointment today.  Attestation Statements:   Reviewed by clinician on day of visit: allergies, medications, problem list, medical history, surgical history, family history, social history, and previous encounter notes.   I, Burt Knack, am acting as transcriptionist for [name], MD.  I have reviewed the above documentation for accuracy and completeness, and I agree with the above. -  Katherina Mires, MD

## 2020-07-29 ENCOUNTER — Other Ambulatory Visit (INDEPENDENT_AMBULATORY_CARE_PROVIDER_SITE_OTHER): Payer: Self-pay | Admitting: Family Medicine

## 2020-07-29 DIAGNOSIS — E1169 Type 2 diabetes mellitus with other specified complication: Secondary | ICD-10-CM

## 2020-07-29 DIAGNOSIS — F3289 Other specified depressive episodes: Secondary | ICD-10-CM

## 2020-08-02 NOTE — Telephone Encounter (Signed)
This patient was last seen by Adah Salvage, FNP and currently has an upcoming appt scheduled on 08/10/20 with her.

## 2020-08-10 ENCOUNTER — Ambulatory Visit (INDEPENDENT_AMBULATORY_CARE_PROVIDER_SITE_OTHER): Payer: Medicare Other | Admitting: Family Medicine

## 2020-08-10 ENCOUNTER — Other Ambulatory Visit: Payer: Self-pay

## 2020-08-10 VITALS — BP 106/72 | HR 94 | Temp 97.5°F | Ht 63.0 in | Wt 249.0 lb

## 2020-08-10 DIAGNOSIS — Z6841 Body Mass Index (BMI) 40.0 and over, adult: Secondary | ICD-10-CM | POA: Diagnosis not present

## 2020-08-10 DIAGNOSIS — E669 Obesity, unspecified: Secondary | ICD-10-CM

## 2020-08-10 DIAGNOSIS — F3289 Other specified depressive episodes: Secondary | ICD-10-CM

## 2020-08-10 DIAGNOSIS — E1169 Type 2 diabetes mellitus with other specified complication: Secondary | ICD-10-CM | POA: Diagnosis not present

## 2020-08-10 MED ORDER — BUPROPION HCL ER (SR) 150 MG PO TB12: 150.0000 mg | ORAL_TABLET | Freq: Two times a day (BID) | ORAL | 0 refills | Status: DC

## 2020-08-12 NOTE — Progress Notes (Signed)
Chief Complaint:   OBESITY Sally [name] is here to discuss her progress with her obesity treatment plan along with follow-up of her obesity related diagnoses. Sally [name] is on the Category 3 Plan and states she is following her eating plan approximately 0% of the time. Sally [name] states she is walking 10 minutes 7 times per week.  Today's visit was #: 41 Starting weight: 281 lbs Starting date: 05/14/2018 Today's weight: 249 lbs Today's date: 08/10/2020 Total lbs lost to date: 32 lbs Total lbs lost since last in-office visit: (+1)  Interim History: Sally [name] has been doing very well over the holidays with a loss of 3 lbs since November 1st despite being up one pound today. She has not been following Cat 3 over the past few weeks. Sally [name] is down 32 lbs overall. She is still fatigued from Covid(had 1st of November).  Subjective:   1. Type 2 diabetes mellitus with other specified complication, without long-term current use of insulin (HCC)  A1c was 5.7 on 05/31/2020. She is on Trulicity and metformin. No hypoglycemia in last few weeks.  Lab Results  Component Value Date   HGBA1C 5.8 (H) 01/12/2020   HGBA1C 6.4 (H) 09/02/2019   HGBA1C 6.0 (H) 04/17/2019   Lab Results  Component Value Date   LDLCALC 83 09/02/2019   CREATININE 0.64 01/12/2020   Lab Results  Component Value Date   INSULIN 7.5 09/02/2019   INSULIN 4.2 04/17/2019   INSULIN 6.6 10/08/2018   INSULIN 9.8 05/14/2018    2. Other depression, with emotional eating  She shows no sign of suicidal or homicidal ideations. Mood is stable and cravings are well controlled. Sally [name] is on bupropion and cymbalta.    Assessment/Plan:   1. Type 2 diabetes mellitus with other specified complication, without long-term current use of insulin (HCC)  Sally [name] will continue her current medications: metformin and Trulicity.   2. Other depression, with emotional eating  Refill bupropion 150 mg BID #60. No refill. - buPROPion (WELLBUTRIN SR) 150  MG 12 hr tablet; Take 1 tablet (150 mg total) by mouth 2 (two) times daily.  Dispense: 180 tablet; Refill: 0  3. Class 3 severe obesity with serious comorbidity and body mass index (BMI) of 40.0 to 44.9 in adult, unspecified obesity type (HCC)  Sally [name] is currently in the action stage of change. As such, her goal is to continue with weight loss efforts. She has agreed to the Category 3 Plan.    Exercise goals: Geri exercise.  Behavioral modification strategies: increasing lean protein intake.  Sally [name] has agreed to follow-up with our clinic in 3 weeks.  Objective:   Blood pressure 106/72, pulse 94, temperature (!) 97.5 F (36.4 C), height 5\' 3"  (1.6 m), weight 249 lb (112.9 kg), last menstrual period 05/31/2012, SpO2 96 %. Body mass index is 44.11 kg/m.  General: Cooperative, alert, well developed, in no acute distress. HEENT: Conjunctivae and lids unremarkable. Cardiovascular: Regular rhythm.  Lungs: Normal work of breathing. Neurologic: No focal deficits.   Lab Results  Component Value Date   CREATININE 0.64 01/12/2020   BUN 19 01/12/2020   NA 140 01/12/2020   K 5.0 01/12/2020   CL 102 01/12/2020   CO2 23 01/12/2020   Lab Results  Component Value Date   ALT 14 01/12/2020   AST 14 01/12/2020   ALKPHOS 84 01/12/2020   BILITOT 0.3 01/12/2020   Lab Results  Component Value Date   HGBA1C 5.8 (H) 01/12/2020   HGBA1C 6.4 (H) 09/02/2019  HGBA1C 6.0 (H) 04/17/2019   HGBA1C 6.6 (H) 10/08/2018   HGBA1C 6.8 (H) 05/14/2018   Lab Results  Component Value Date   INSULIN 7.5 09/02/2019   INSULIN 4.2 04/17/2019   INSULIN 6.6 10/08/2018   INSULIN 9.8 05/14/2018   Lab Results  Component Value Date   TSH 3.190 09/02/2019   Lab Results  Component Value Date   CHOL 163 09/02/2019   HDL 55 09/02/2019   LDLCALC 83 09/02/2019   TRIG 147 09/02/2019   Lab Results  Component Value Date   WBC 8.0 05/14/2018   HGB 12.0 05/14/2018   HCT 37.4 05/14/2018   MCV 94 05/14/2018    PLT 380 11/06/2017   No results found for: IRON, TIBC, FERRITIN  Obesity Behavioral Intervention:   Approximately 15 minutes were spent on the discussion below.  ASK: We discussed the diagnosis of obesity with Sally [name] today and Sally [name] agreed to give [name] permission to discuss obesity behavioral modification therapy today.  ASSESS: Sally [name] has the diagnosis of obesity and her BMI today is 44.2. Sally [name] is in the action stage of change.   ADVISE: Sally [name] was educated on the multiple health risks of obesity as well as the benefit of weight loss to improve her health. She was advised of the need for long term treatment and the importance of lifestyle modifications to improve her current health and to decrease her risk of future health problems.  AGREE: Multiple dietary modification options and treatment options were discussed and Sally [name] agreed to follow the recommendations documented in the above note.  ARRANGE: Sally [name] was educated on the importance of frequent visits to treat obesity as outlined per CMS and USPSTF guidelines and agreed to schedule her next follow up appointment today.  Attestation Statements:   Reviewed by clinician on day of visit: allergies, medications, problem list, medical history, surgical history, family history, social history, and previous encounter notes.   Felecia Jan, am acting as Energy manager for [name], FNP.  I have reviewed the above documentation for accuracy and completeness, and I agree with the above. -  [name], FNP

## 2020-08-15 ENCOUNTER — Encounter (INDEPENDENT_AMBULATORY_CARE_PROVIDER_SITE_OTHER): Payer: Self-pay | Admitting: Family Medicine

## 2020-08-30 ENCOUNTER — Other Ambulatory Visit: Payer: Self-pay

## 2020-08-30 ENCOUNTER — Encounter (INDEPENDENT_AMBULATORY_CARE_PROVIDER_SITE_OTHER): Payer: Self-pay | Admitting: Family Medicine

## 2020-08-30 ENCOUNTER — Ambulatory Visit (INDEPENDENT_AMBULATORY_CARE_PROVIDER_SITE_OTHER): Payer: Medicare Other | Admitting: Family Medicine

## 2020-08-30 VITALS — BP 135/77 | HR 93 | Temp 97.9°F | Ht 63.0 in | Wt 253.0 lb

## 2020-08-30 DIAGNOSIS — Z6841 Body Mass Index (BMI) 40.0 and over, adult: Secondary | ICD-10-CM

## 2020-08-30 DIAGNOSIS — E1169 Type 2 diabetes mellitus with other specified complication: Secondary | ICD-10-CM

## 2020-08-30 DIAGNOSIS — E559 Vitamin D deficiency, unspecified: Secondary | ICD-10-CM

## 2020-08-30 MED ORDER — VITAMIN D (ERGOCALCIFEROL) 1.25 MG (50000 UNIT) PO CAPS: 50000.0000 [IU] | ORAL_CAPSULE | ORAL | 0 refills | Status: DC

## 2020-08-30 NOTE — Progress Notes (Signed)
Chief Complaint:   OBESITY Sally [name] is here to discuss her progress with her obesity treatment plan along with follow-up of her obesity related diagnoses. Sally [name] is on the Category 3 Plan and states she is following her eating plan approximately 50% of the time. Sally [name] states she is walking for 10-15 minutes 7 times per week.  Today's visit was #: 42 Starting weight: 281 lbs Starting date: 05/14/2018 Today's weight: 253 lbs Today's date: 08/30/2020 Total lbs lost to date: 28 Total lbs lost since last in-office visit: 0  Interim History: She began our program in 2019 and she has lost 28 lbs. She needs a total knee replacement, but she must have 40 BMI.  Sally [name] tends to want to snack at 5 pm and then after supper. She reports has been eating more carbs recently. She started walking for exercise recently.   Subjective:   1. Vitamin D deficiency Sally [name]'s Vit D level was low at 26.5. She is on weekly prescription Vit D.  2. Type 2 diabetes mellitus with other specified complication, without long-term current use of insulin (HCC) Sally [name]'s diabetes mellitus is well controlled. She is on Truilcity 1.5 mg and metformin. She receives Trulicity for free through [facility].  Lab Results  Component Value Date   HGBA1C 5.8 (H) 01/12/2020   HGBA1C 6.4 (H) 09/02/2019   HGBA1C 6.0 (H) 04/17/2019   Lab Results  Component Value Date   LDLCALC 83 09/02/2019   CREATININE 0.64 01/12/2020   Lab Results  Component Value Date   INSULIN 7.5 09/02/2019   INSULIN 4.2 04/17/2019   INSULIN 6.6 10/08/2018   INSULIN 9.8 05/14/2018   Assessment/Plan:   1. Vitamin D deficiency Low Vitamin D level contributes to fatigue and are associated with obesity, breast, and colon cancer. We will refill prescription Vitamin D for 1 month, and we will recheck labs at her next office visit. Sally [name] will follow-up for routine testing of Vitamin D, at least 2-3 times per year to avoid over-replacement.  - Vitamin D,  Ergocalciferol, (DRISDOL) 1.25 MG (50000 UNIT) CAPS capsule; Take 1 capsule (50,000 Units total) by mouth every 7 (seven) days.  Dispense: 4 capsule; Refill: 0  2. Type 2 diabetes mellitus with other specified complication, without long-term current use of insulin (HCC)  We discussed switching to Ozempic for better appetite control. Sally [name] will call her insurance to see if Ozempic is covered.  3. Class 3 severe obesity with serious comorbidity and body mass index (BMI) of 40.0 to 44.9 in adult, unspecified obesity type (HCC) Sally [name] is currently in the action stage of change. As such, her goal is to continue with weight loss efforts. She has agreed to the Category 3 Plan.   Sally [name] may have a snack at 5 pm (apple from lunch). We discussed bariatric surgery and she is interested. Information was provided to sign up for the bariatric surgery seminar [facility] seminar). Patient was advised that bariatric surgery is a tool for weight loss and will not be successful long-term without significant dietary changes and exercise.   We will recheck fasting labs at her next office visit.  Exercise goals: As is.  Behavioral modification strategies: decreasing simple carbohydrates and better snacking choices.  Sally [name] has agreed to follow-up with our clinic in 3 weeks.   Objective:   Blood pressure 135/77, pulse 93, temperature 97.9 F (36.6 C), height 5\' 3"  (1.6 m), weight 253 lb (114.8 kg), last menstrual period 05/31/2012, SpO2 97 %. Body mass  index is 44.82 kg/m.  General: Cooperative, alert, well developed, in no acute distress. HEENT: Conjunctivae and lids unremarkable. Cardiovascular: Regular rhythm.  Lungs: Normal work of breathing. Neurologic: No focal deficits.   Lab Results  Component Value Date   CREATININE 0.64 01/12/2020   BUN 19 01/12/2020   NA 140 01/12/2020   K 5.0 01/12/2020   CL 102 01/12/2020   CO2 23 01/12/2020   Lab Results  Component Value  Date   ALT 14 01/12/2020   AST 14 01/12/2020   ALKPHOS 84 01/12/2020   BILITOT 0.3 01/12/2020   Lab Results  Component Value Date   HGBA1C 5.8 (H) 01/12/2020   HGBA1C 6.4 (H) 09/02/2019   HGBA1C 6.0 (H) 04/17/2019   HGBA1C 6.6 (H) 10/08/2018   HGBA1C 6.8 (H) 05/14/2018   Lab Results  Component Value Date   INSULIN 7.5 09/02/2019   INSULIN 4.2 04/17/2019   INSULIN 6.6 10/08/2018   INSULIN 9.8 05/14/2018   Lab Results  Component Value Date   TSH 3.190 09/02/2019   Lab Results  Component Value Date   CHOL 163 09/02/2019   HDL 55 09/02/2019   LDLCALC 83 09/02/2019   TRIG 147 09/02/2019   Lab Results  Component Value Date   WBC 8.0 05/14/2018   HGB 12.0 05/14/2018   HCT 37.4 05/14/2018   MCV 94 05/14/2018   PLT 380 11/06/2017   No results found for: IRON, TIBC, FERRITIN  Obesity Behavioral Intervention:   Approximately 15 minutes were spent on the discussion below.  ASK: We discussed the diagnosis of obesity with Sally [name] today and Sally [name] agreed to give [name] permission to discuss obesity behavioral modification therapy today.  ASSESS: Sally [name] has the diagnosis of obesity and her BMI today is 44.83. Leara is in the action stage of change.   ADVISE: Ercia was educated on the multiple health risks of obesity as well as the benefit of weight loss to improve her health. She was advised of the need for long term treatment and the importance of lifestyle modifications to improve her current health and to decrease her risk of future health problems.  AGREE: Multiple dietary modification options and treatment options were discussed and Sally [name] agreed to follow the recommendations documented in the above note.  ARRANGE: Sally [name] was educated on the importance of frequent visits to treat obesity as outlined per CMS and USPSTF guidelines and agreed to schedule her next follow up appointment today.  Attestation Statements:   Reviewed by clinician on day of visit: allergies,  medications, problem list, medical history, surgical history, family history, social history, and previous encounter notes.   Trude Mcburney, am acting as Energy manager for [place], FNP-C.  I have reviewed the above documentation for accuracy and completeness, and I agree with the above. -  Sally Sans, FNP

## 2020-08-31 LAB — COLOGUARD: COLOGUARD: NEGATIVE

## 2020-09-10 ENCOUNTER — Encounter (INDEPENDENT_AMBULATORY_CARE_PROVIDER_SITE_OTHER): Payer: Self-pay | Admitting: Family Medicine

## 2020-09-14 ENCOUNTER — Ambulatory Visit (INDEPENDENT_AMBULATORY_CARE_PROVIDER_SITE_OTHER): Payer: Medicare Other | Admitting: Family Medicine

## 2020-09-16 ENCOUNTER — Other Ambulatory Visit: Payer: Self-pay

## 2020-09-16 ENCOUNTER — Ambulatory Visit (INDEPENDENT_AMBULATORY_CARE_PROVIDER_SITE_OTHER): Payer: Medicare Other | Admitting: Family Medicine

## 2020-09-16 ENCOUNTER — Encounter (INDEPENDENT_AMBULATORY_CARE_PROVIDER_SITE_OTHER): Payer: Self-pay | Admitting: Family Medicine

## 2020-09-16 VITALS — BP 103/70 | HR 92 | Temp 97.8°F | Ht 63.0 in | Wt 254.0 lb

## 2020-09-16 DIAGNOSIS — Z6841 Body Mass Index (BMI) 40.0 and over, adult: Secondary | ICD-10-CM

## 2020-09-16 DIAGNOSIS — E1169 Type 2 diabetes mellitus with other specified complication: Secondary | ICD-10-CM | POA: Diagnosis not present

## 2020-09-16 DIAGNOSIS — E559 Vitamin D deficiency, unspecified: Secondary | ICD-10-CM | POA: Diagnosis not present

## 2020-09-16 MED ORDER — VITAMIN D (ERGOCALCIFEROL) 1.25 MG (50000 UNIT) PO CAPS: 50000.0000 [IU] | ORAL_CAPSULE | ORAL | 0 refills | Status: DC

## 2020-09-20 NOTE — Progress Notes (Signed)
Chief Complaint:   OBESITY Sally [name] is here to discuss her progress with her obesity treatment plan along with follow-up of her obesity related diagnoses. Sally [name] is on the Category 3 Plan and states she is following her eating plan approximately 60% of the time. Sally [name] states she is doing 0 minutes 0 times per week.  Today's visit was #: 43 Starting weight: 281 lbs Starting date: 05/14/2018 Today's weight: 254 lbs Today's date: 09/16/2020 Total lbs lost to date: 27 lbs Total lbs lost since last in-office visit: 0 (+1)  Interim History: Sally [name] has had a flare of fibromyalgia and has been quite fatigued. She is skipping breakfast and has not been packing her lunch for work. She has been very deficient with protein intake. She also reports reduced exercise.  Subjective:   1. Vitamin D deficiency Sally [name]'s Vitamin D level was low at 26.5 on 01/12/2020. She is currently taking prescription vitamin D 50,000 IU each week. s.   Ref. Range 01/12/2020 13:14  Vitamin D, 25-Hydroxy Latest Ref Range: 30.0 - 100.0 ng/mL 26.5 (L)    2. Type 2 diabetes mellitus with other specified complication, without long-term current use of insulin (HCC) Sally [name]'s diabetes is well controlled with Metformin and Trulicity 1.5 mg weekly. She denies hypoglycemia. Her last A1c was 5.7 on 05/31/2020).  Lab Results  Component Value Date   HGBA1C 5.8 (H) 01/12/2020   HGBA1C 6.4 (H) 09/02/2019   HGBA1C 6.0 (H) 04/17/2019   Lab Results  Component Value Date   LDLCALC 83 09/02/2019   CREATININE 0.64 01/12/2020   Lab Results  Component Value Date   INSULIN 7.5 09/02/2019   INSULIN 4.2 04/17/2019   INSULIN 6.6 10/08/2018   INSULIN 9.8 05/14/2018    Assessment/Plan:   1. Vitamin D deficiency  She agrees to continue to take prescription Vitamin D @50 ,000 IU every week and will follow-up for routine testing of Vitamin D, at least 2-3 times per year to avoid over-replacement.  - Vitamin D, Ergocalciferol,  (DRISDOL) 1.25 MG (50000 UNIT) CAPS capsule; Take 1 capsule (50,000 Units total) by mouth every 7 (seven) days.  Dispense: 4 capsule; Refill: 0  2. Type 2 diabetes mellitus with other specified complication, without long-term current use of insulin (HCC) Continue Metformin and Trulicity.  3. Class 3 severe obesity with serious comorbidity and body mass index (BMI) of 45.0 to 49.9 in adult, unspecified obesity type (HCC) Sally [name] is currently in the action stage of change. As such, her goal is to continue with weight loss efforts. She has agreed to the Category 3 Plan.   Exercise goals: All adults should avoid inactivity. Some physical activity is better than none, and adults who participate in any amount of physical activity gain some health benefits.  Behavioral modification strategies: increasing lean protein intake and no skipping meals.  Sally [name] has agreed to follow-up with our clinic in 3 weeks.   Objective:   Blood pressure 103/70, pulse 92, temperature 97.8 F (36.6 C), height 5\' 3"  (1.6 m), weight 254 lb (115.2 kg), last menstrual period 05/31/2012, SpO2 98 %. Body mass index is 44.99 kg/m.  General: Cooperative, alert, well developed, in no acute distress. HEENT: Conjunctivae and lids unremarkable. Cardiovascular: Regular rhythm.  Lungs: Normal work of breathing. Neurologic: No focal deficits.   Lab Results  Component Value Date   CREATININE 0.64 01/12/2020   BUN 19 01/12/2020   NA 140 01/12/2020   K 5.0 01/12/2020   CL 102 01/12/2020   CO2 23  01/12/2020   Lab Results  Component Value Date   ALT 14 01/12/2020   AST 14 01/12/2020   ALKPHOS 84 01/12/2020   BILITOT 0.3 01/12/2020   Lab Results  Component Value Date   HGBA1C 5.8 (H) 01/12/2020   HGBA1C 6.4 (H) 09/02/2019   HGBA1C 6.0 (H) 04/17/2019   HGBA1C 6.6 (H) 10/08/2018   HGBA1C 6.8 (H) 05/14/2018   Lab Results  Component Value Date   INSULIN 7.5 09/02/2019   INSULIN 4.2 04/17/2019   INSULIN 6.6  10/08/2018   INSULIN 9.8 05/14/2018   Lab Results  Component Value Date   TSH 3.190 09/02/2019   Lab Results  Component Value Date   CHOL 163 09/02/2019   HDL 55 09/02/2019   LDLCALC 83 09/02/2019   TRIG 147 09/02/2019   Lab Results  Component Value Date   WBC 8.0 05/14/2018   HGB 12.0 05/14/2018   HCT 37.4 05/14/2018   MCV 94 05/14/2018   PLT 380 11/06/2017   No results found for: IRON, TIBC, FERRITIN  Obesity Behavioral Intervention:   Approximately 15 minutes were spent on the discussion below.  ASK: We discussed the diagnosis of obesity with Sally [name] today and Sally [name] agreed to give [name] permission to discuss obesity behavioral modification therapy today.  ASSESS: Sally [name] has the diagnosis of obesity and her BMI today is 45.0. Sally [name] is in the action stage of change.   ADVISE: Sally [name] was educated on the multiple health risks of obesity as well as the benefit of weight loss to improve her health. She was advised of the need for long term treatment and the importance of lifestyle modifications to improve her current health and to decrease her risk of future health problems.  AGREE: Multiple dietary modification options and treatment options were discussed and Kawena agreed to follow the recommendations documented in the above note.  ARRANGE: Sally [name] was educated on the importance of frequent visits to treat obesity as outlined per CMS and USPSTF guidelines and agreed to schedule her next follow up appointment today.  Attestation Statements:   Reviewed by clinician on day of visit: allergies, medications, problem list, medical history, surgical history, family history, social history, and previous encounter notes.  [name], am acting as Energy manager for [place], FNP.  I have reviewed the above documentation for accuracy and completeness, and I agree with the above. -  [name], FNP

## 2020-09-21 ENCOUNTER — Encounter (INDEPENDENT_AMBULATORY_CARE_PROVIDER_SITE_OTHER): Payer: Self-pay | Admitting: Family Medicine

## 2020-10-07 ENCOUNTER — Encounter (INDEPENDENT_AMBULATORY_CARE_PROVIDER_SITE_OTHER): Payer: Self-pay | Admitting: Family Medicine

## 2020-10-07 ENCOUNTER — Ambulatory Visit (INDEPENDENT_AMBULATORY_CARE_PROVIDER_SITE_OTHER): Payer: Medicare Other | Admitting: Family Medicine

## 2020-10-07 ENCOUNTER — Other Ambulatory Visit: Payer: Self-pay

## 2020-10-07 VITALS — BP 131/79 | HR 82 | Temp 97.5°F | Ht 63.0 in | Wt 256.0 lb

## 2020-10-07 DIAGNOSIS — E1169 Type 2 diabetes mellitus with other specified complication: Secondary | ICD-10-CM

## 2020-10-07 DIAGNOSIS — E559 Vitamin D deficiency, unspecified: Secondary | ICD-10-CM

## 2020-10-07 DIAGNOSIS — Z6841 Body Mass Index (BMI) 40.0 and over, adult: Secondary | ICD-10-CM

## 2020-10-07 MED ORDER — BLOOD GLUCOSE MONITOR KIT: PACK | 0 refills | Status: DC

## 2020-10-07 MED ORDER — VITAMIN D (ERGOCALCIFEROL) 1.25 MG (50000 UNIT) PO CAPS: 50000.0000 [IU] | ORAL_CAPSULE | ORAL | 0 refills | Status: DC

## 2020-10-08 LAB — HEMOGLOBIN A1C
Est. average glucose Bld gHb Est-mCnc: 120 mg/dL
Hgb A1c MFr Bld: 5.8 % — ABNORMAL HIGH (ref 4.8–5.6)

## 2020-10-08 LAB — VITAMIN D 25 HYDROXY (VIT D DEFICIENCY, FRACTURES): Vit D, 25-Hydroxy: 49.4 ng/mL (ref 30.0–100.0)

## 2020-10-11 ENCOUNTER — Encounter (INDEPENDENT_AMBULATORY_CARE_PROVIDER_SITE_OTHER): Payer: Self-pay | Admitting: Family Medicine

## 2020-10-12 ENCOUNTER — Encounter (INDEPENDENT_AMBULATORY_CARE_PROVIDER_SITE_OTHER): Payer: Self-pay | Admitting: Family Medicine

## 2020-10-12 NOTE — Progress Notes (Signed)
Chief Complaint:   OBESITY Sally Sally [name] is here to discuss her progress with her obesity treatment plan along with follow-up of her obesity related diagnoses. Sally Sally [name] is on the Category 3 Plan and states she is following her eating plan approximately 50% of the time. Sally Sally [name] states she is not exercising regularly due to asthma.  Today's visit was #: 23 Starting weight: 281 lbs Starting date: 05/14/2018 Today's weight: 256 lbs Today's date: 10/07/2020 Total lbs lost to date: 25 lbs Total lbs lost since last in-office visit: (+2)  Interim History: Sally Sally [name] has been on prednisone recently for an asthma flare.  She is up 2 lbs today. She is moving forward with plans for bariatric surgery and I support her in this.  She is working on her packet.  Subjective:   1. Vitamin D deficiency Vitamin D is low at 26.5.  On weekly prescription vitamin D.  2. Type 2 diabetes mellitus with other specified complication, without long-term current use of insulin (Sally Sally [name]) She does not check CBGs at home.  On Trulicity and metformin.  Well controlled.  Last A1c was 5.8.  Lab Results  Component Value Date   HGBA1C 5.8 (H) 10/07/2020   HGBA1C 5.8 (H) 01/12/2020   HGBA1C 6.4 (H) 09/02/2019   Lab Results  Component Value Date   LDLCALC 83 09/02/2019   CREATININE 0.64 01/12/2020   Lab Results  Component Value Date   INSULIN 7.5 09/02/2019   INSULIN 4.2 04/17/2019   INSULIN 6.6 10/08/2018   INSULIN 9.8 05/14/2018   Assessment/Plan:   1. Vitamin D deficiency Will refill vitamin D 50,000 IU weekly today and check vitamin D level today.  - Refill Vitamin D, Ergocalciferol, (DRISDOL) 1.25 MG (50000 UNIT) CAPS capsule; Take 1 capsule (50,000 Units total) by mouth every 7 (seven) days.  Dispense: 4 capsule; Refill: 0 - VITAMIN D 25 Hydroxy (Vit-D Deficiency, Fractures)  2. Type 2 diabetes mellitus with other specified complication, without long-term current use of insulin (HCC) Will send order for   glucometer and supplies today.  Will also check A1c.  Continue Trulicity and metformin.  - blood glucose meter kit and supplies KIT; Dispense based on patient and insurance preference. Use up to four times daily as directed.  Dispense: 1 each; Refill: 0 - Hemoglobin A1c  3. Class 3 severe obesity with serious comorbidity and body mass index (BMI) of 45.0 to 49.9 in adult, unspecified obesity type (HCC)  Sally Sally [name] in the action stage of change. As such, her goal is to continue with weight loss efforts. She has Sally [name] to the Category 3 Plan.   I will complete a support letter for her as well as a weight history form (completed and faxed).  Exercise goals: All adults should avoid inactivity. Some physical activity is better than none, and adults who participate in any amount of physical activity gain some health benefits.  Behavioral modification strategies: increasing lean protein intake and decreasing simple carbohydrates.  Sally Sally [name] to follow-up with our clinic in 3 weeks.  Sally Sally [name] we would discuss her lab results at her next visit unless there is a critical issue that needs to be addressed sooner. Sally Sally [name] to keep her next visit at the Sally [name] upon time to discuss these results.  Objective:   Blood pressure 131/79, pulse 82, temperature (!) 97.5 F (36.4 C), height _0  (1.6 m), weight 256 lb (116.1 kg), last menstrual period 05/31/2012, SpO2 99 %. Body mass index is 45.35  kg/m.  General: Cooperative, alert, well developed, in no acute distress. HEENT: Conjunctivae and lids unremarkable. Cardiovascular: Regular rhythm.  Lungs: Normal work of breathing. Neurologic: No focal deficits.   Lab Results  Component Value Date   CREATININE 0.64 01/12/2020   BUN 19 01/12/2020   NA 140 01/12/2020   K 5.0 01/12/2020   CL 102 01/12/2020   CO2 23 01/12/2020   Lab Results  Component Value Date   ALT 14 01/12/2020   AST 14 01/12/2020   ALKPHOS 84  01/12/2020   BILITOT 0.3 01/12/2020   Lab Results  Component Value Date   HGBA1C 5.8 (H) 10/07/2020   HGBA1C 5.8 (H) 01/12/2020   HGBA1C 6.4 (H) 09/02/2019   HGBA1C 6.0 (H) 04/17/2019   HGBA1C 6.6 (H) 10/08/2018   Lab Results  Component Value Date   INSULIN 7.5 09/02/2019   INSULIN 4.2 04/17/2019   INSULIN 6.6 10/08/2018   INSULIN 9.8 05/14/2018   Lab Results  Component Value Date   TSH 3.190 09/02/2019   Lab Results  Component Value Date   CHOL 163 09/02/2019   HDL 55 09/02/2019   LDLCALC 83 09/02/2019   TRIG 147 09/02/2019   Lab Results  Component Value Date   WBC 8.0 05/14/2018   HGB 12.0 05/14/2018   HCT 37.4 05/14/2018   MCV 94 05/14/2018   PLT 380 11/06/2017   Obesity Behavioral Intervention:   Approximately 15 minutes were spent on the discussion below.  ASK: We discussed the diagnosis of obesity with Sally Sally [name] today and Sally Sally [name] Sally [name] to give [name] permission to discuss obesity behavioral modification therapy today.  ASSESS: Sally Sally [name] has the diagnosis of obesity and her BMI today is 45.3. Sally Sally [name] is in the action stage of change.   ADVISE: Sally Sally [name] was educated on the multiple health risks of obesity as well as the benefit of weight loss to improve her health. She was advised of the need for long term treatment and the importance of lifestyle modifications to improve her current health and to decrease her risk of future health problems.  AGREE: Multiple dietary modification options and treatment options were discussed and Sally Sally [name] Sally [name] to follow the recommendations documented in the above note.  ARRANGE: Sally Sally [name] was educated on the importance of frequent visits to treat obesity as outlined per CMS and USPSTF guidelines and Sally [name] to schedule her next follow up appointment today.  Attestation Statements:   Reviewed by clinician on day of visit: allergies, medications, problem list, medical history, surgical history, family history, social history, and previous  encounter notes.  I, Water quality scientist, CMA, am acting as Location manager for [employer], [place].  I have reviewed the above documentation for accuracy and completeness, and I agree with the above. -  Georgianne Fick, FNP

## 2020-10-13 ENCOUNTER — Encounter (INDEPENDENT_AMBULATORY_CARE_PROVIDER_SITE_OTHER): Payer: Self-pay | Admitting: Family Medicine

## 2020-10-13 NOTE — Telephone Encounter (Signed)
fyi

## 2020-10-28 ENCOUNTER — Ambulatory Visit (INDEPENDENT_AMBULATORY_CARE_PROVIDER_SITE_OTHER): Payer: Medicare Other | Admitting: Family Medicine

## 2020-11-02 ENCOUNTER — Encounter (INDEPENDENT_AMBULATORY_CARE_PROVIDER_SITE_OTHER): Payer: Self-pay | Admitting: Family Medicine

## 2020-11-03 NOTE — Telephone Encounter (Signed)
Please advise. FYI as of 11/03/20 we have received no paperwork.

## 2020-11-03 NOTE — Telephone Encounter (Signed)
Dawn's pt please advise or should she this wait for Dawn to return?

## 2020-11-09 ENCOUNTER — Other Ambulatory Visit: Payer: Self-pay | Admitting: General Surgery

## 2020-11-09 ENCOUNTER — Other Ambulatory Visit (HOSPITAL_COMMUNITY): Payer: Self-pay | Admitting: General Surgery

## 2020-11-17 ENCOUNTER — Other Ambulatory Visit: Payer: Self-pay

## 2020-11-17 ENCOUNTER — Inpatient Hospital Stay (HOSPITAL_COMMUNITY): Payer: Medicare Other | Admitting: Registered Nurse

## 2020-11-17 ENCOUNTER — Inpatient Hospital Stay (HOSPITAL_COMMUNITY)
Admission: EM | Admit: 2020-11-17 | Discharge: 2020-11-21 | DRG: 853 | Disposition: A | Discharge status: Home / Self Care | Payer: Medicare Other | Attending: Internal Medicine | Admitting: Internal Medicine

## 2020-11-17 ENCOUNTER — Inpatient Hospital Stay (HOSPITAL_COMMUNITY): Payer: Medicare Other

## 2020-11-17 ENCOUNTER — Ambulatory Visit (HOSPITAL_COMMUNITY): Payer: Medicare Other

## 2020-11-17 ENCOUNTER — Emergency Department (HOSPITAL_COMMUNITY): Payer: Medicare Other

## 2020-11-17 ENCOUNTER — Encounter (HOSPITAL_COMMUNITY): Payer: Self-pay

## 2020-11-17 ENCOUNTER — Encounter (HOSPITAL_COMMUNITY): Payer: Self-pay | Admitting: Emergency Medicine

## 2020-11-17 ENCOUNTER — Encounter (HOSPITAL_COMMUNITY)
Admission: EM | Disposition: A | Discharge status: Home / Self Care | Payer: Self-pay | Source: Home / Self Care | Attending: Internal Medicine

## 2020-11-17 DIAGNOSIS — K589 Irritable bowel syndrome without diarrhea: Secondary | ICD-10-CM | POA: Diagnosis not present

## 2020-11-17 DIAGNOSIS — Z86711 Personal history of pulmonary embolism: Secondary | ICD-10-CM | POA: Diagnosis not present

## 2020-11-17 DIAGNOSIS — Z86718 Personal history of other venous thrombosis and embolism: Secondary | ICD-10-CM | POA: Diagnosis not present

## 2020-11-17 DIAGNOSIS — Z818 Family history of other mental and behavioral disorders: Secondary | ICD-10-CM

## 2020-11-17 DIAGNOSIS — E119 Type 2 diabetes mellitus without complications: Secondary | ICD-10-CM | POA: Diagnosis not present

## 2020-11-17 DIAGNOSIS — I10 Essential (primary) hypertension: Secondary | ICD-10-CM | POA: Diagnosis present

## 2020-11-17 DIAGNOSIS — Z79899 Other long term (current) drug therapy: Secondary | ICD-10-CM

## 2020-11-17 DIAGNOSIS — Z6841 Body Mass Index (BMI) 40.0 and over, adult: Secondary | ICD-10-CM | POA: Diagnosis not present

## 2020-11-17 DIAGNOSIS — Z833 Family history of diabetes mellitus: Secondary | ICD-10-CM

## 2020-11-17 DIAGNOSIS — E785 Hyperlipidemia, unspecified: Secondary | ICD-10-CM | POA: Diagnosis present

## 2020-11-17 DIAGNOSIS — M069 Rheumatoid arthritis, unspecified: Secondary | ICD-10-CM | POA: Diagnosis present

## 2020-11-17 DIAGNOSIS — F32A Depression, unspecified: Secondary | ICD-10-CM | POA: Diagnosis present

## 2020-11-17 DIAGNOSIS — N136 Pyonephrosis: Secondary | ICD-10-CM | POA: Diagnosis present

## 2020-11-17 DIAGNOSIS — Z8619 Personal history of other infectious and parasitic diseases: Secondary | ICD-10-CM

## 2020-11-17 DIAGNOSIS — A419 Sepsis, unspecified organism: Secondary | ICD-10-CM

## 2020-11-17 DIAGNOSIS — D849 Immunodeficiency, unspecified: Secondary | ICD-10-CM | POA: Diagnosis not present

## 2020-11-17 DIAGNOSIS — F419 Anxiety disorder, unspecified: Secondary | ICD-10-CM | POA: Diagnosis present

## 2020-11-17 DIAGNOSIS — Z87891 Personal history of nicotine dependence: Secondary | ICD-10-CM

## 2020-11-17 DIAGNOSIS — Z7984 Long term (current) use of oral hypoglycemic drugs: Secondary | ICD-10-CM

## 2020-11-17 DIAGNOSIS — K219 Gastro-esophageal reflux disease without esophagitis: Secondary | ICD-10-CM | POA: Diagnosis present

## 2020-11-17 DIAGNOSIS — R6521 Severe sepsis with septic shock: Secondary | ICD-10-CM

## 2020-11-17 DIAGNOSIS — M797 Fibromyalgia: Secondary | ICD-10-CM | POA: Diagnosis present

## 2020-11-17 DIAGNOSIS — R5382 Chronic fatigue, unspecified: Secondary | ICD-10-CM | POA: Diagnosis present

## 2020-11-17 DIAGNOSIS — N179 Acute kidney failure, unspecified: Secondary | ICD-10-CM | POA: Diagnosis not present

## 2020-11-17 DIAGNOSIS — T1490XA Injury, unspecified, initial encounter: Secondary | ICD-10-CM

## 2020-11-17 DIAGNOSIS — A4151 Sepsis due to Escherichia coli [E. coli]: Principal | ICD-10-CM | POA: Diagnosis present

## 2020-11-17 DIAGNOSIS — Z8249 Family history of ischemic heart disease and other diseases of the circulatory system: Secondary | ICD-10-CM

## 2020-11-17 DIAGNOSIS — G4730 Sleep apnea, unspecified: Secondary | ICD-10-CM | POA: Diagnosis present

## 2020-11-17 DIAGNOSIS — Z825 Family history of asthma and other chronic lower respiratory diseases: Secondary | ICD-10-CM

## 2020-11-17 DIAGNOSIS — Z20822 Contact with and (suspected) exposure to covid-19: Secondary | ICD-10-CM | POA: Diagnosis present

## 2020-11-17 DIAGNOSIS — Z791 Long term (current) use of non-steroidal anti-inflammatories (NSAID): Secondary | ICD-10-CM

## 2020-11-17 DIAGNOSIS — R600 Localized edema: Secondary | ICD-10-CM | POA: Diagnosis present

## 2020-11-17 DIAGNOSIS — J449 Chronic obstructive pulmonary disease, unspecified: Secondary | ICD-10-CM | POA: Diagnosis present

## 2020-11-17 HISTORY — PX: CYSTOSCOPY W/ URETERAL STENT PLACEMENT: SHX1429

## 2020-11-17 HISTORY — DX: Personal history of other infectious and parasitic diseases: Z86.19

## 2020-11-17 LAB — PROTIME-INR
INR: 1.1 (ref 0.8–1.2)
Prothrombin Time: 14.1 seconds (ref 11.4–15.2)

## 2020-11-17 LAB — LACTIC ACID, PLASMA: Lactic Acid, Venous: 1.8 mmol/L (ref 0.5–1.9)

## 2020-11-17 LAB — COMPREHENSIVE METABOLIC PANEL
ALT: 19 U/L (ref 0–44)
AST: 18 U/L (ref 15–41)
Albumin: 3.4 g/dL — ABNORMAL LOW (ref 3.5–5.0)
Alkaline Phosphatase: 68 U/L (ref 38–126)
Anion gap: 6 (ref 5–15)
BUN: 8 mg/dL (ref 8–23)
CO2: 25 mmol/L (ref 22–32)
Calcium: 8.3 mg/dL — ABNORMAL LOW (ref 8.9–10.3)
Chloride: 100 mmol/L (ref 98–111)
Creatinine, Ser: 0.97 mg/dL (ref 0.44–1.00)
GFR, Estimated: >60 mL/min (ref >60)
Glucose, Bld: 112 mg/dL — ABNORMAL HIGH (ref 70–99)
Potassium: 3.5 mmol/L (ref 3.5–5.1)
Sodium: 131 mmol/L — ABNORMAL LOW (ref 135–145)
Total Bilirubin: 0.9 mg/dL (ref 0.3–1.2)
Total Protein: 6.7 g/dL (ref 6.5–8.1)

## 2020-11-17 LAB — CBC WITH DIFFERENTIAL/PLATELET
Abs Immature Granulocytes: 0.1 10*3/uL — ABNORMAL HIGH (ref 0.00–0.07)
Basophils Absolute: 0 10*3/uL (ref 0.0–0.1)
Basophils Relative: 0 %
Eosinophils Absolute: 0 10*3/uL (ref 0.0–0.5)
Eosinophils Relative: 0 %
HCT: 35.5 % — ABNORMAL LOW (ref 36.0–46.0)
Hemoglobin: 11.3 g/dL — ABNORMAL LOW (ref 12.0–15.0)
Immature Granulocytes: 1 %
Lymphocytes Relative: 6 %
Lymphs Abs: 1 10*3/uL (ref 0.7–4.0)
MCH: 29.8 pg (ref 26.0–34.0)
MCHC: 31.8 g/dL (ref 30.0–36.0)
MCV: 93.7 fL (ref 80.0–100.0)
Monocytes Absolute: 0.2 10*3/uL (ref 0.1–1.0)
Monocytes Relative: 1 %
Neutro Abs: 15.2 10*3/uL — ABNORMAL HIGH (ref 1.7–7.7)
Neutrophils Relative %: 92 %
Platelets: 304 10*3/uL (ref 150–400)
RBC: 3.79 MIL/uL — ABNORMAL LOW (ref 3.87–5.11)
RDW: 15.1 % (ref 11.5–15.5)
WBC: 16.5 10*3/uL — ABNORMAL HIGH (ref 4.0–10.5)
nRBC: 0 % (ref 0.0–0.2)

## 2020-11-17 LAB — RESP PANEL BY RT-PCR (FLU A&B, COVID) ARPGX2
Influenza A by PCR: NEGATIVE
Influenza B by PCR: NEGATIVE
SARS Coronavirus 2 by RT PCR: NEGATIVE

## 2020-11-17 LAB — GLUCOSE, CAPILLARY
Glucose-Capillary: 116 mg/dL — ABNORMAL HIGH (ref 70–99)
Glucose-Capillary: 73 mg/dL (ref 70–99)

## 2020-11-17 LAB — URINALYSIS, ROUTINE W REFLEX MICROSCOPIC
Bilirubin Urine: NEGATIVE
Glucose, UA: NEGATIVE mg/dL
Ketones, ur: NEGATIVE mg/dL
Nitrite: NEGATIVE
Protein, ur: NEGATIVE mg/dL
Specific Gravity, Urine: 1.027 (ref 1.005–1.030)
WBC, UA: >50 WBC/hpf — ABNORMAL HIGH (ref 0–5)
pH: 5 (ref 5.0–8.0)

## 2020-11-17 LAB — APTT: aPTT: 34 seconds (ref 24–36)

## 2020-11-17 LAB — LIPASE, BLOOD: Lipase: 36 U/L (ref 11–51)

## 2020-11-17 SURGERY — CYSTOSCOPY, WITH RETROGRADE PYELOGRAM AND URETERAL STENT INSERTION
Anesthesia: Monitor Anesthesia Care | Site: Bladder | Laterality: Left

## 2020-11-17 MED ORDER — EPHEDRINE 5 MG/ML INJ
INTRAVENOUS | Status: AC
  Filled 2020-11-17: qty 10

## 2020-11-17 MED ORDER — OXYCODONE HCL 5 MG PO TABS
5.0000 mg | ORAL_TABLET | ORAL | Status: DC | PRN
  Administered 2020-11-18: 5 mg via ORAL
  Filled 2020-11-17: qty 1

## 2020-11-17 MED ORDER — IOHEXOL 300 MG/ML  SOLN
100.0000 mL | Freq: Once | INTRAMUSCULAR | Status: AC | PRN
  Administered 2020-11-17: 100 mL via INTRAVENOUS

## 2020-11-17 MED ORDER — LACTATED RINGERS IV BOLUS (SEPSIS)
1000.0000 mL | Freq: Once | INTRAVENOUS | Status: AC
  Administered 2020-11-17: 1000 mL via INTRAVENOUS

## 2020-11-17 MED ORDER — LACTATED RINGERS IV BOLUS (SEPSIS)
1000.0000 mL | Freq: Once | INTRAVENOUS | Status: DC
  Administered 2020-11-17: 1000 mL via INTRAVENOUS

## 2020-11-17 MED ORDER — PANTOPRAZOLE SODIUM 40 MG PO TBEC
40.0000 mg | DELAYED_RELEASE_TABLET | Freq: Every day | ORAL | Status: DC
  Administered 2020-11-18 – 2020-11-21 (×4): 40 mg via ORAL
  Filled 2020-11-17 (×4): qty 1

## 2020-11-17 MED ORDER — SCOPOLAMINE 1 MG/3DAYS TD PT72: 1.0000 | MEDICATED_PATCH | TRANSDERMAL | Status: DC

## 2020-11-17 MED ORDER — SODIUM CHLORIDE 0.9 % IV BOLUS
1000.0000 mL | Freq: Once | INTRAVENOUS | Status: AC
  Administered 2020-11-17: 1000 mL via INTRAVENOUS

## 2020-11-17 MED ORDER — BUPROPION HCL ER (SR) 150 MG PO TB12: 150.0000 mg | ORAL_TABLET | Freq: Two times a day (BID) | ORAL | Status: DC

## 2020-11-17 MED ORDER — POLYETHYLENE GLYCOL 3350 17 G PO PACK
17.0000 g | PACK | Freq: Every day | ORAL | Status: DC | PRN
  Filled 2020-11-17: qty 1

## 2020-11-17 MED ORDER — FENTANYL CITRATE (PF) 100 MCG/2ML IJ SOLN
100.0000 ug | Freq: Once | INTRAMUSCULAR | Status: AC
  Administered 2020-11-17: 100 ug via INTRAVENOUS
  Filled 2020-11-17: qty 2

## 2020-11-17 MED ORDER — PROPOFOL 500 MG/50ML IV EMUL
INTRAVENOUS | Status: DC | PRN
  Administered 2020-11-17: 25 ug/kg/min via INTRAVENOUS

## 2020-11-17 MED ORDER — INSULIN ASPART 100 UNIT/ML ~~LOC~~ SOLN
0.0000 [IU] | Freq: Three times a day (TID) | SUBCUTANEOUS | Status: DC
  Administered 2020-11-18: 2 [IU] via SUBCUTANEOUS
  Administered 2020-11-18: 3 [IU] via SUBCUTANEOUS
  Administered 2020-11-19 – 2020-11-21 (×2): 2 [IU] via SUBCUTANEOUS

## 2020-11-17 MED ORDER — PHENYLEPHRINE HCL-NACL 10-0.9 MG/250ML-% IV SOLN
25.0000 ug/min | INTRAVENOUS | Status: DC
  Administered 2020-11-17 – 2020-11-18 (×2): 25 ug/min via INTRAVENOUS
  Filled 2020-11-17 (×2): qty 250

## 2020-11-17 MED ORDER — LIDOCAINE HCL URETHRAL/MUCOSAL 2 % EX GEL
CUTANEOUS | Status: AC
  Filled 2020-11-17: qty 11

## 2020-11-17 MED ORDER — SODIUM CHLORIDE 0.9 % IV SOLN: 250.0000 mL | INTRAVENOUS | Status: DC

## 2020-11-17 MED ORDER — PHENYLEPHRINE HCL (PRESSORS) 10 MG/ML IV SOLN
INTRAVENOUS | Status: DC | PRN
  Administered 2020-11-17: 80 ug via INTRAVENOUS

## 2020-11-17 MED ORDER — PHENYLEPHRINE 40 MCG/ML (10ML) SYRINGE FOR IV PUSH (FOR BLOOD PRESSURE SUPPORT)
PREFILLED_SYRINGE | INTRAVENOUS | Status: AC
  Filled 2020-11-17: qty 10

## 2020-11-17 MED ORDER — FENTANYL CITRATE (PF) 100 MCG/2ML IJ SOLN: 25.0000 ug | INTRAMUSCULAR | Status: DC | PRN

## 2020-11-17 MED ORDER — IOHEXOL 300 MG/ML  SOLN
INTRAMUSCULAR | Status: DC | PRN
  Administered 2020-11-17: 300 mL

## 2020-11-17 MED ORDER — ONDANSETRON HCL 4 MG/2ML IJ SOLN
4.0000 mg | Freq: Once | INTRAMUSCULAR | Status: AC
  Administered 2020-11-17: 4 mg via INTRAVENOUS
  Filled 2020-11-17: qty 2

## 2020-11-17 MED ORDER — DULAGLUTIDE 1.5 MG/0.5ML ~~LOC~~ SOAJ: 1.5000 mg | SUBCUTANEOUS | Status: DC

## 2020-11-17 MED ORDER — ALBUMIN HUMAN 5 % IV SOLN: INTRAVENOUS | Status: DC | PRN

## 2020-11-17 MED ORDER — NOREPINEPHRINE 4 MG/250ML-% IV SOLN
2.0000 ug/min | INTRAVENOUS | Status: DC
  Administered 2020-11-17: 2 ug/min via INTRAVENOUS
  Filled 2020-11-17: qty 250

## 2020-11-17 MED ORDER — ENOXAPARIN SODIUM 60 MG/0.6ML ~~LOC~~ SOLN
60.0000 mg | SUBCUTANEOUS | Status: DC
  Administered 2020-11-17: 60 mg via SUBCUTANEOUS
  Filled 2020-11-17: qty 0.6

## 2020-11-17 MED ORDER — ONDANSETRON HCL 4 MG/2ML IJ SOLN
4.0000 mg | Freq: Four times a day (QID) | INTRAMUSCULAR | Status: DC | PRN
  Administered 2020-11-19 (×2): 4 mg via INTRAVENOUS
  Filled 2020-11-17 (×2): qty 2

## 2020-11-17 MED ORDER — ACETAMINOPHEN 325 MG PO TABS
650.0000 mg | ORAL_TABLET | Freq: Once | ORAL | Status: AC
  Administered 2020-11-17: 650 mg via ORAL
  Filled 2020-11-17: qty 2

## 2020-11-17 MED ORDER — SUCCINYLCHOLINE CHLORIDE 200 MG/10ML IV SOSY
PREFILLED_SYRINGE | INTRAVENOUS | Status: AC
  Filled 2020-11-17: qty 10

## 2020-11-17 MED ORDER — CYCLOBENZAPRINE HCL 10 MG PO TABS: 10.0000 mg | ORAL_TABLET | Freq: Three times a day (TID) | ORAL | Status: DC | PRN

## 2020-11-17 MED ORDER — NOREPINEPHRINE 4 MG/250ML-% IV SOLN
0.0000 ug/min | INTRAVENOUS | Status: DC
  Administered 2020-11-17: 2 ug/min via INTRAVENOUS
  Filled 2020-11-17: qty 250

## 2020-11-17 MED ORDER — MIDAZOLAM HCL 2 MG/2ML IJ SOLN
INTRAMUSCULAR | Status: AC
  Filled 2020-11-17: qty 2

## 2020-11-17 MED ORDER — LACTATED RINGERS IV SOLN: INTRAVENOUS | Status: DC

## 2020-11-17 MED ORDER — ACETAMINOPHEN 650 MG RE SUPP
650.0000 mg | Freq: Once | RECTAL | Status: AC
  Administered 2020-11-17: 650 mg via RECTAL
  Filled 2020-11-17: qty 1

## 2020-11-17 MED ORDER — MONTELUKAST SODIUM 10 MG PO TABS
10.0000 mg | ORAL_TABLET | Freq: Every day | ORAL | Status: DC
  Administered 2020-11-17 – 2020-11-20 (×4): 10 mg via ORAL
  Filled 2020-11-17 (×4): qty 1

## 2020-11-17 MED ORDER — GABAPENTIN 300 MG PO CAPS
300.0000 mg | ORAL_CAPSULE | Freq: Two times a day (BID) | ORAL | Status: DC
  Administered 2020-11-18 – 2020-11-21 (×7): 300 mg via ORAL
  Filled 2020-11-17 (×8): qty 1

## 2020-11-17 MED ORDER — BELLADONNA ALKALOIDS-OPIUM 16.2-60 MG RE SUPP
1.0000 | Freq: Once | RECTAL | Status: DC
  Filled 2020-11-17: qty 1

## 2020-11-17 MED ORDER — ROCURONIUM BROMIDE 10 MG/ML (PF) SYRINGE
PREFILLED_SYRINGE | INTRAVENOUS | Status: AC
  Filled 2020-11-17: qty 10

## 2020-11-17 MED ORDER — PROPOFOL 10 MG/ML IV BOLUS
INTRAVENOUS | Status: DC | PRN
  Administered 2020-11-17: 20 mg via INTRAVENOUS

## 2020-11-17 MED ORDER — FOLIC ACID 1 MG PO TABS
1.0000 mg | ORAL_TABLET | Freq: Every day | ORAL | Status: DC
  Administered 2020-11-18 – 2020-11-21 (×4): 1 mg via ORAL
  Filled 2020-11-17 (×4): qty 1

## 2020-11-17 MED ORDER — ONDANSETRON HCL 4 MG/2ML IJ SOLN
INTRAMUSCULAR | Status: DC | PRN
  Administered 2020-11-17: 4 mg via INTRAVENOUS

## 2020-11-17 MED ORDER — HYDROMORPHONE HCL 1 MG/ML IJ SOLN
0.5000 mg | INTRAMUSCULAR | Status: DC | PRN
  Administered 2020-11-18: 1 mg via INTRAVENOUS
  Filled 2020-11-17: qty 1

## 2020-11-17 MED ORDER — ACETAMINOPHEN 325 MG PO TABS
650.0000 mg | ORAL_TABLET | Freq: Four times a day (QID) | ORAL | Status: DC | PRN
  Administered 2020-11-17 – 2020-11-20 (×6): 650 mg via ORAL
  Filled 2020-11-17 (×6): qty 2

## 2020-11-17 MED ORDER — LACTATED RINGERS IV BOLUS
500.0000 mL | Freq: Once | INTRAVENOUS | Status: AC
  Administered 2020-11-17: 500 mL via INTRAVENOUS

## 2020-11-17 MED ORDER — FENTANYL CITRATE (PF) 250 MCG/5ML IJ SOLN
INTRAMUSCULAR | Status: AC
  Filled 2020-11-17: qty 5

## 2020-11-17 MED ORDER — SODIUM CHLORIDE 0.9 % IV SOLN
2.0000 g | INTRAVENOUS | Status: DC
  Administered 2020-11-18 – 2020-11-19 (×2): 2 g via INTRAVENOUS
  Filled 2020-11-17 (×3): qty 20

## 2020-11-17 MED ORDER — SODIUM CHLORIDE 0.9 % IV SOLN
250.0000 mL | INTRAVENOUS | Status: DC
  Administered 2020-11-18: 250 mL via INTRAVENOUS

## 2020-11-17 MED ORDER — FENTANYL CITRATE (PF) 100 MCG/2ML IJ SOLN: 50.0000 ug | Freq: Once | INTRAMUSCULAR | Status: DC

## 2020-11-17 MED ORDER — ACETAMINOPHEN 650 MG RE SUPP: 650.0000 mg | Freq: Four times a day (QID) | RECTAL | Status: DC | PRN

## 2020-11-17 MED ORDER — FENTANYL CITRATE (PF) 250 MCG/5ML IJ SOLN
INTRAMUSCULAR | Status: DC | PRN
  Administered 2020-11-17: 50 ug via INTRAVENOUS

## 2020-11-17 MED ORDER — LIDOCAINE 2% (20 MG/ML) 5 ML SYRINGE
INTRAMUSCULAR | Status: AC
  Filled 2020-11-17: qty 5

## 2020-11-17 MED ORDER — CHLORHEXIDINE GLUCONATE 0.12 % MT SOLN
15.0000 mL | OROMUCOSAL | Status: AC
  Administered 2020-11-17: 15 mL via OROMUCOSAL

## 2020-11-17 MED ORDER — METRONIDAZOLE IN NACL 5-0.79 MG/ML-% IV SOLN
500.0000 mg | Freq: Once | INTRAVENOUS | Status: AC
  Administered 2020-11-17: 500 mg via INTRAVENOUS
  Filled 2020-11-17: qty 100

## 2020-11-17 MED ORDER — SODIUM CHLORIDE 0.9 % IV BOLUS: 1000.0000 mL | Freq: Once | INTRAVENOUS | Status: DC

## 2020-11-17 MED ORDER — ALBUTEROL SULFATE (2.5 MG/3ML) 0.083% IN NEBU: 2.5000 mg | INHALATION_SOLUTION | Freq: Four times a day (QID) | RESPIRATORY_TRACT | Status: DC | PRN

## 2020-11-17 MED ORDER — CHLORHEXIDINE GLUCONATE 0.12 % MT SOLN
OROMUCOSAL | Status: AC
  Filled 2020-11-17: qty 15

## 2020-11-17 MED ORDER — DULOXETINE HCL 30 MG PO CPEP: 30.0000 mg | ORAL_CAPSULE | Freq: Three times a day (TID) | ORAL | Status: DC

## 2020-11-17 MED ORDER — MORPHINE SULFATE (PF) 4 MG/ML IV SOLN
4.0000 mg | Freq: Once | INTRAVENOUS | Status: AC
  Administered 2020-11-17: 4 mg via INTRAVENOUS
  Filled 2020-11-17: qty 1

## 2020-11-17 MED ORDER — SODIUM CHLORIDE 0.9 % IV SOLN
2.0000 g | Freq: Once | INTRAVENOUS | Status: AC
  Administered 2020-11-17: 2 g via INTRAVENOUS
  Filled 2020-11-17: qty 20

## 2020-11-17 MED ORDER — IBUPROFEN 400 MG PO TABS: 600.0000 mg | ORAL_TABLET | Freq: Once | ORAL | Status: DC

## 2020-11-17 MED ORDER — LACTATED RINGERS IV BOLUS: 1000.0000 mL | Freq: Once | INTRAVENOUS | Status: DC

## 2020-11-17 MED ORDER — DOCUSATE SODIUM 100 MG PO CAPS: 100.0000 mg | ORAL_CAPSULE | Freq: Two times a day (BID) | ORAL | Status: DC | PRN

## 2020-11-17 MED ORDER — LACTATED RINGERS IV BOLUS (SEPSIS): 1000.0000 mL | Freq: Once | INTRAVENOUS | Status: DC

## 2020-11-17 MED ORDER — LACTATED RINGERS IV BOLUS
1000.0000 mL | Freq: Once | INTRAVENOUS | Status: AC
  Administered 2020-11-17: 1000 mL via INTRAVENOUS

## 2020-11-17 MED ORDER — CELECOXIB 200 MG PO CAPS: 200.0000 mg | ORAL_CAPSULE | Freq: Once | ORAL | Status: DC

## 2020-11-17 MED ORDER — PROPOFOL 10 MG/ML IV BOLUS
INTRAVENOUS | Status: AC
  Filled 2020-11-17: qty 20

## 2020-11-17 MED ORDER — ENOXAPARIN SODIUM 60 MG/0.6ML ~~LOC~~ SOLN
60.0000 mg | SUBCUTANEOUS | Status: DC
  Filled 2020-11-17: qty 0.6

## 2020-11-17 MED ORDER — ONDANSETRON HCL 4 MG PO TABS
4.0000 mg | ORAL_TABLET | Freq: Four times a day (QID) | ORAL | Status: DC | PRN
Start: 2020-11-17 — End: 2020-11-21

## 2020-11-17 SURGICAL SUPPLY — 23 items
BAG DRN RND TRDRP ANRFLXCHMBR (UROLOGICAL SUPPLIES) ×1
BAG URINE DRAIN 2000ML AR STRL (UROLOGICAL SUPPLIES) ×2 IMPLANT
BAG URO CATCHER STRL LF (MISCELLANEOUS) ×2 IMPLANT
CATH FOLEY 2WAY SLVR  5CC 16FR (CATHETERS)
CATH FOLEY 2WAY SLVR 5CC 16FR (CATHETERS) IMPLANT
CATH INTERMIT  6FR 70CM (CATHETERS) ×2 IMPLANT
GLOVE BIOGEL M STRL SZ7.5 (GLOVE) ×2 IMPLANT
GOWN STRL REUS W/ TWL LRG LVL3 (GOWN DISPOSABLE) ×1 IMPLANT
GOWN STRL REUS W/ TWL XL LVL3 (GOWN DISPOSABLE) ×1 IMPLANT
GOWN STRL REUS W/TWL LRG LVL3 (GOWN DISPOSABLE) ×2
GOWN STRL REUS W/TWL XL LVL3 (GOWN DISPOSABLE) ×2
GUIDEWIRE ANG ZIPWIRE 038X150 (WIRE) IMPLANT
GUIDEWIRE STR DUAL SENSOR (WIRE) ×2 IMPLANT
KIT TURNOVER KIT B (KITS) ×2 IMPLANT
MANIFOLD NEPTUNE II (INSTRUMENTS) IMPLANT
NS IRRIG 1000ML POUR BTL (IV SOLUTION) IMPLANT
PACK CYSTO (CUSTOM PROCEDURE TRAY) ×2 IMPLANT
STENT URET 6FRX24 CONTOUR (STENTS) ×1 IMPLANT
STENT URET 6FRX26 CONTOUR (STENTS) IMPLANT
SYPHON OMNI JUG (MISCELLANEOUS) ×2 IMPLANT
TOWEL GREEN STERILE FF (TOWEL DISPOSABLE) ×2 IMPLANT
TUBE CONNECTING 12X1/4 (SUCTIONS) IMPLANT
WATER STERILE IRR 3000ML UROMA (IV SOLUTION) ×2 IMPLANT

## 2020-11-17 NOTE — ED Notes (Signed)
MD made aware of blood pressure of 60 and came to bedside.

## 2020-11-17 NOTE — ED Notes (Signed)
Patients husband signed consent and is at bedside with patient.

## 2020-11-17 NOTE — Anesthesia Postprocedure Evaluation (Signed)
Anesthesia Post Note  Patient: Sally [name]  Procedure(s) Performed: CYSTOSCOPY LEFT WITH RETROGRADE PYELOGRAM/URETERAL STENT PLACEMENT LEFT DOUBLE J PLACEMENT  (Left Bladder)     Patient location during evaluation: PACU Anesthesia Type: MAC Level of consciousness: awake and alert Pain management: pain level controlled Vital Signs Assessment: post-procedure vital signs reviewed and stable Respiratory status: spontaneous breathing and respiratory function stable Cardiovascular status: stable Postop Assessment: no apparent nausea or vomiting Anesthetic complications: no   No complications documented.  Last Vitals:  Vitals:   11/17/20 2034 11/17/20 2039  BP: (!) 89/68 (!) 84/51  Pulse: (!) 111 (!) 111  Resp: 20 20  Temp:    SpO2: 94% 92%    Last Pain:  Vitals:   11/17/20 2020  TempSrc:   PainSc: Asleep                 Lauraann Missey DANIEL

## 2020-11-17 NOTE — ED Notes (Signed)
Unable to get a second set of cultures due to antibiotics being started.

## 2020-11-17 NOTE — ED Notes (Signed)
Bladder scanned, .

## 2020-11-17 NOTE — Op Note (Signed)
Preoperative diagnosis: 6 mm left upper ureteral stone with pyelonephrosis/sepsis  Postoperative diagnosis: Same  Principal procedure: Cystoscopy, left retrograde ureteropyelogram, fluoroscopic interpretation, placement of 6 French by 24 cm contour double-J stent without tether  Surgeon: Verenice Westrich  Anesthesia: IV sedation  Specimen: None  Complications: None  Drains: 16 French Foley catheter  Indications: 60 year old female presented to the emergency room here at [facility] earlier in the day with a weeks worth of left flank pain.  She was found to be hypotensive upon presentation and evaluation revealed the patient to have a left upper ureteral stone, urinary tract infection and probable sepsis.  I have discussed urgent placement of a ureteral catheter with the patient and her husband.  I discussed the procedure, eventual management of her stone following hospitalization for her sepsis and treatment of infection.  The understand the procedure, and desire to proceed.  Findings: Urethra was normal.  Bladder displayed mild erythema, generalized consistent with cystitis.  Ureteral orifice ease were normal.  Retrograde study of the left ureter revealed a normal mid and distal ureter with a filling defect in the upper/mid ureter and proximal mild hydroureteronephrosis.  Description of procedure: The patient was properly identified and marked.  She received preoperative IV Rocephin.  She was taken to the operating room where she was placed on the table in the supine position, eventually moved to the dorsolithotomy position.  Due to her hypotension, we just used IV sedation for the anesthesia.  Following proper timeout and sterile prep and drape, 21 French panendoscope was advanced into the bladder with the above-mentioned findings.  Using a 6 [place] open-ended catheter a retrograde ureteropyelogram was performed using Omnipaque.  The above-mentioned findings were noted.  Following this, sensor  tip guidewire was advanced through the open-ended catheter, past the obstructing stone, with a curl seen in the upper pole calyx.  The open-ended catheter was removed and I slid a 6 [place] by 24 cm contour double-J stent over top of the guidewire, the tether had been removed.  Once properly positioned, the guidewire was removed and the stent was deployed in the ureter with excellent proximal distal curl seen using fluoroscopy and cystoscopy, respectively.  At this point, the bladder was drained.  The scope was removed, I placed a 16 French Foley catheter, balloon filled with 10 cc of water and this was hooked to dependent drainage.  At this point the procedure was terminated.  The patient tolerated the procedure well.  She was then taken to the PACU and eventually to the ICU for further management.

## 2020-11-17 NOTE — H&P (Signed)
NAME:  Sally [name], MRN:  [id number], DOB:  Sep 02, 1960, LOS: 0 ADMISSION DATE:  11/17/2020, CONSULTATION DATE:  11/17/20 REFERRING MD:  Tegeler, CHIEF COMPLAINT:  LLQ pain   History of Present Illness:  60 year old woman w/ hx RA, PE, depression, DM, asthma, fibromyalgia presenting with LLQ pain and fever.   Has been having colic symptoms for a few weeks that got really bad over weekend.  No hematuria.  Subjective fevers/chills.  Found to be in septic shock with obstructing uretal stone.  Urology has been consulted and will take her to [place] for stent placement.  PCCM consulted for further management.  Pertinent  Medical History  RA on enbrel, MTX NIDDM HTN Seasonal allergies Fibromyalgia  Significant Hospital Events: Including procedures, antibiotic start and stop dates in addition to other pertinent events   . 4/20 admitted, to go to OR for left ureteral stent placement  Interim History / Subjective:  Consulted.  A bit confused after opiate administration.  ROS as below.  Objective   Blood pressure (!) 61/35, pulse (!) 110, temperature (!) 100.8 F (38.2 C), temperature source Oral, resp. rate 19, height _0  (1.626 m), weight 116.6 kg, last menstrual period 05/31/2012, SpO2 97 %.        Intake/Output Summary (Last 24 hours) at 11/17/2020 1752 Last data filed at 11/17/2020 1711 Gross per 24 hour  Intake 3700 ml  Output --  Net 3700 ml   Filed Weights   11/17/20 1233  Weight: 116.6 kg    Examination: General: ill appearing woman lying in bed HENT: MM dry, trachea midline, malampatti 3 Lungs: clear, no wheezing or accessory muscle use Cardiovascular: tachycardic, ext warm Abdomen: soft, L CVA tenderness Extremities: no edema, no clubbing Neuro: moves all 4 ext to command Psych: a bit confused to situation but answering questions appropriately  Labs/imaging that I havepersonally reviewed  (right click and "Reselect all SmartList Selections" daily)  Mild  AKI Low sodium WBC up  Resolved Hospital Problem list   n/a  Assessment & Plan:  Septic shock secondary to infected obstructing left uretal stone with hydronephrosis.  Has received 4L fluids. Acute kidney injury- related to poor PO Acute metabolic encephalopathy- this this is just from opiates RA- immunosuppressed but not on chronic steroids DM w/o hyperglycemia, NID at home - Peripheral levophed protocol, only needing 1-3 mcg/min, hopefully can avoid central line - To OR this evening for stent placement, appreciate urology help - Ceftriaxone x 7-10 days - f/u urine/blood cultures - Another bolus, LR at 150/hr, avoid nephrotoxins  Best practice (right click and "Reselect all SmartList Selections" daily)  Diet:  NPO Pain/Anxiety/Delirium protocol (if indicated): No VAP protocol (if indicated): Not indicated DVT prophylaxis: SCD GI prophylaxis: N/A and PPI Glucose control:  SSI Yes Central venous access:  N/A but may need Arterial line:  N/A Foley:  Yes, and it is still needed Mobility:  bed rest  PT consulted: N/A Last date of multidisciplinary goals of care discussion [pending] Code Status:  full code Disposition: ICU pending pressor liberation  Labs   CBC: Recent Labs  Lab 11/17/20 1229  WBC 16.5*  NEUTROABS 15.2*  HGB 11.3*  HCT 35.5*  MCV 93.7  PLT 240    Basic Metabolic Panel: Recent Labs  Lab 11/17/20 1229  NA 131*  K 3.5  CL 100  CO2 25  GLUCOSE 112*  BUN 8  CREATININE 0.97  CALCIUM 8.3*   GFR: Estimated Creatinine Clearance: 76.4 mL/min (by  C-G formula based on SCr of 0.97 mg/dL). Recent Labs  Lab 11/17/20 1229 11/17/20 1300  WBC 16.5*  --   LATICACIDVEN  --  1.8    Liver Function Tests: Recent Labs  Lab 11/17/20 1229  AST 18  ALT 19  ALKPHOS 68  BILITOT 0.9  PROT 6.7  ALBUMIN 3.4*   Recent Labs  Lab 11/17/20 1229  LIPASE 36   No results for input(s): AMMONIA in the last 168 hours.  ABG No results found for: PHART,  PCO2ART, PO2ART, HCO3, TCO2, ACIDBASEDEF, O2SAT   Coagulation Profile: Recent Labs  Lab 11/17/20 1348  INR 1.1    Cardiac Enzymes: No results for input(s): CKTOTAL, CKMB, CKMBINDEX, TROPONINI in the last 168 hours.  HbA1C: Hgb A1c MFr Bld  Date/Time Value Ref Range Status  10/07/2020 02:21 PM 5.8 (H) 4.8 - 5.6 % Final    Comment:             Prediabetes: 5.7 - 6.4          Diabetes: >6.4          Glycemic control for adults with diabetes: <7.0   01/12/2020 01:14 PM 5.8 (H) 4.8 - 5.6 % Final    Comment:             Prediabetes: 5.7 - 6.4          Diabetes: >6.4          Glycemic control for adults with diabetes: <7.0     CBG: No results for input(s): GLUCAP in the last 168 hours.  Review of Systems:    Positive Symptoms in bold:   Constitutional fevers, chills, weight loss, fatigue, anorexia, malaise  Eyes decreased vision, double vision, eye irritation  Ears, Nose, Mouth, Throat sore throat, trouble swallowing, sinus congestion  Cardiovascular chest pain, paroxysmal nocturnal dyspnea, lower ext edema, palpitations   Respiratory SOB, cough, DOE, hemoptysis, wheezing  Gastrointestinal nausea, vomiting, diarrhea  Genitourinary burning with urination, trouble urinating  Musculoskeletal joint aches, joint swelling, back pain  Integumentary  rashes, skin lesions  Neurological focal weakness, focal numbness, trouble speaking, headaches  Psychiatric depression, anxiety, confusion  Endocrine polyuria, polydipsia, cold intolerance, heat intolerance  Hematologic abnormal bruising, abnormal bleeding, unexplained nose bleeds  Allergic/Immunologic recurrent infections, hives, swollen lymph nodes     Past Medical History:  She,  has a past medical history of ADD (attention deficit disorder), Anemia, Anxiety, Arthritis, Asthma, Back pain, CFS (chronic fatigue syndrome), Colitis, Depression, Diabetes ([place]), Dyspnea, Fibromyalgia, GERD (gastroesophageal reflux disease), blood  clots, IBS (irritable bowel syndrome), Joint pain, Left knee pain, Leg edema, Osteoarthritis, Prediabetes, Pulmonary embolism (HCC), RA (rheumatoid arthritis) (HCC), RA (rheumatoid arthritis) ([place]), RLS (restless legs syndrome), Sleep apnea, Vitamin B 12 deficiency, and Vitamin D deficiency.   Surgical History:   Past Surgical History:  Procedure Laterality Date  . EYE SURGERY    . MOUTH SURGERY       Social History:   reports that she has quit smoking. She has a 12.00 pack-year smoking history. She has never used smokeless tobacco. She reports that she does not drink alcohol and does not use drugs.   Family History:  Her family history includes Anxiety disorder in her mother; Asthma in her father and mother; Cancer in her father; Depression in her mother; Diabetes in her mother; Eating disorder in her mother; Hypertension in her mother; Obesity in her mother; Pneumonia in her mother; Sudden death in her mother.   Allergies No Known Allergies  Home Medications  Prior to Admission medications   Medication Sig Start Date End Date Taking? Authorizing Provider  albuterol (PROVENTIL HFA;VENTOLIN HFA) 108 (90 BASE) MCG/ACT inhaler Inhale 2 puffs into the lungs every 6 (six) hours as needed. FOR WHEEZING    [provider]  blood glucose meter kit and supplies KIT Dispense based on patient and insurance preference. Use up to four times daily as directed. 10/07/20   Whitmire, Joneen Boers, FNP  blood glucose meter kit and supplies Dispense based on patient and insurance preference. Use up to four times daily as directed. (FOR ICD-10 E10.9, E11.9). Test twice daily 05/27/18   Dennard Nip D, MD  buPROPion [facility] SR) 150 MG 12 hr tablet Take 1 tablet (150 mg total) by mouth 2 (two) times daily. 08/10/20   Whitmire, Joneen Boers, FNP  cyclobenzaprine (FLEXERIL) 10 MG tablet Take 10 mg by mouth 3 (three) times daily as needed for muscle spasms.    [provider]  Dulaglutide (TRULICITY)  1.5 AG/5.3MI SOPN Inject 0.5 mLs (1.5 mg total) into the skin once a week. 01/12/20   Whitmire, Joneen Boers, FNP  DULoxetine (CYMBALTA) 30 MG capsule Take 30 mg by mouth 3 (three) times daily. 10/31/17   [provider]  Etanercept (ENBREL [place]) Inject into the skin.    [provider]  folic acid (FOLVITE) 1 MG tablet Take 1 mg by mouth daily.    [provider]  furosemide (LASIX) 20 MG tablet Take 20 mg by mouth daily as needed.    [provider]  gabapentin (NEURONTIN) 300 MG capsule Take 300 mg by mouth 2 (two) times daily.  10/22/17   [provider]  meloxicam (MOBIC) 15 MG tablet Take 15 mg by mouth at bedtime. 09/26/17   [provider]  metFORMIN (GLUCOPHAGE-XR) 500 MG 24 hr tablet TAKE 1 TABLET BY MOUTH  TWICE DAILY 08/02/20   Whitmire, Arrie Aran W, FNP  methotrexate (RHEUMATREX) 2.5 MG tablet Take 2.5 mg by mouth once a week. Take 8 tablets by mouth once a week on Friday. 10/29/17   [provider]  montelukast (SINGULAIR) 10 MG tablet Take 10 mg by mouth at bedtime.    [provider]  Multiple Vitamins-Minerals (HAIR SKIN & NAILS ADVANCED PO) Take 2 tablets by mouth 2 (two) times daily.    [provider]  mupirocin ointment (BACTROBAN) 2 % Place 1 application into the nose 2 (two) times daily as needed.    [provider]  omeprazole (PRILOSEC) 20 MG capsule Take 1 capsule (20 mg total) by mouth daily. 10/27/19   Jearld Lesch A, DO  traMADol (ULTRAM) 50 MG tablet Take 50 mg by mouth 2 (two) times daily as needed for moderate pain.  11/02/17   [provider]  Vitamin D, Ergocalciferol, (DRISDOL) 1.25 MG (50000 UNIT) CAPS capsule Take 1 capsule (50,000 Units total) by mouth every 7 (seven) days. 10/07/20   Whitmire, Joneen Boers, FNP     Critical care time: 41 minutes not including any separately billable procedures

## 2020-11-17 NOTE — Transfer of Care (Signed)
Immediate Anesthesia Transfer of Care Note  Patient: Sally [name]  Procedure(s) Performed: CYSTOSCOPY LEFT WITH RETROGRADE PYELOGRAM/URETERAL STENT PLACEMENT LEFT DOUBLE J PLACEMENT  (Left Bladder)  Patient Location: PACU  Anesthesia Type:General  Level of Consciousness: drowsy, patient cooperative and responds to stimulation  Airway & Oxygen Therapy: Patient Spontanous Breathing and Patient connected to face mask oxygen  Post-op Assessment: Report given to RN and Post -op Vital signs reviewed and stable  Post vital signs: Reviewed and stable  Last Vitals:  Vitals Value Taken Time  BP 94/58 11/17/20 2005  Temp    Pulse 110 11/17/20 2005  Resp 25 11/17/20 2005  SpO2 100 % 11/17/20 2005  Vitals shown include unvalidated device data.  Last Pain:  Vitals:   11/17/20 1837  TempSrc: Oral  PainSc:          Complications: No complications documented.

## 2020-11-17 NOTE — Progress Notes (Addendum)
eLink Physician-Brief Progress Note Patient Name: Sally [name] DOB: 1961/06/25 MRN: [id number]   Date of Service  11/17/2020  HPI/Events of Note  Multiple issues: 1. Hypotension - BP = 99/46 on Norepinephrine IV infusion via PIV at 9 mcg/min. 2. Patient has passed a bedside swallowing evaluation. Request for meds with sips.  eICU Interventions  Plan: 1. Phenylephrine IV infusion via PIV. Titrate to MAP >= 65. 2. Bolus with 0.9 NaCl 1 liter IV over 1 hour now.  3. NPO except meds with sips.      Intervention Category Major Interventions: Other:  [name] 11/17/2020, 10:03 PM

## 2020-11-17 NOTE — ED Notes (Signed)
Patient is only alert to self and place.  PA made aware.

## 2020-11-17 NOTE — ED Provider Notes (Signed)
[facility] EMERGENCY DEPARTMENT Provider Note   CSN: [id number] Arrival date & time: 11/17/20  1156     History No chief complaint on file.   Sally [name] is a 59 y.o. female with a history of diabetes, IBS, colitis, asthma.  Patient presents with chief complaint of left lower quadrant pain and fever.  Patient reports that her left lower quadrant pain began on Thursday 4/14.  Patient reports that pain has worsened over this time..  At present patient rates her pain 9/10 on the pain scale.  Patient denies any aggravating factors.  Patient reports she has had some improvement in her pain when taking tramadol.  Endorses fevers, chills, decreased appetite, constipation, and nausea.  Patient reports her last bowel movement was 4/14.  Patient has been able to pass flatus as recently as this morning.  Patient denies any blood in her stool or melena on that bowel movement.  Patient denies any history of abdominal surgeries.  Patient reports taking Tylenol just prior to arrival in emergency department  Patient denies rhinorrhea, nasal congestion, sore throat, cough, rectal bleeding, abdominal distention, vomiting, GU complaints, syncope, lightheadedness, dizziness, rash, neck pain, neck stiffness.  HPI     Past Medical History:  Diagnosis Date  . ADD (attention deficit disorder)   . Anemia   . Anxiety   . Arthritis   . Asthma   . Back pain   . CFS (chronic fatigue syndrome)   . Colitis   . Depression   . Diabetes (HCC)   . Dyspnea   . Fibromyalgia   . GERD (gastroesophageal reflux disease)   . Hx of blood clots   . IBS (irritable bowel syndrome)   . Joint pain   . Left knee pain   . Leg edema   . Osteoarthritis   . Prediabetes   . Pulmonary embolism (HCC)   . RA (rheumatoid arthritis) (HCC)   . RA (rheumatoid arthritis) (HCC)   . RLS (restless legs syndrome)   . Sleep apnea   . Vitamin B 12 deficiency   . Vitamin D deficiency     Patient Active  Problem List   Diagnosis Date Noted  . Elevated blood pressure reading without diagnosis of hypertension 06/24/2020  . Depression 04/27/2020  . Diabetes mellitus (HCC) 11/10/2019  . Class 3 severe obesity with serious comorbidity and body mass index (BMI) of 45.0 to 49.9 in adult (HCC) 10/28/2019  . Vitamin D deficiency 10/23/2019  . Colitis   . RLS (restless legs syndrome)   . IBS (irritable bowel syndrome)     Past Surgical History:  Procedure Laterality Date  . EYE SURGERY    . MOUTH SURGERY       OB History    Gravida  6   Para  5   Term  5   Preterm      AB  1   Living  5     SAB      IAB      Ectopic      Multiple      Live Births              Family History  Problem Relation Age of Onset  . Pneumonia Mother   . Asthma Mother   . Diabetes Mother   . Hypertension Mother   . Depression Mother   . Anxiety disorder Mother   . Eating disorder Mother   . Obesity Mother   . Sudden death Mother   .   Cancer Father        Brain tumor  . Asthma Father     Social History   Tobacco Use  . Smoking status: Former Smoker    Packs/day: 1.50    Years: 8.00    Pack years: 12.00  . Smokeless tobacco: Never Used  Vaping Use  . Vaping Use: Never used  Substance Use Topics  . Alcohol use: No    Alcohol/week: 0.0 standard drinks  . Drug use: No    Home Medications Prior to Admission medications   Medication Sig Start Date End Date Taking? Authorizing Provider  albuterol (PROVENTIL HFA;VENTOLIN HFA) 108 (90 BASE) MCG/ACT inhaler Inhale 2 puffs into the lungs every 6 (six) hours as needed. FOR WHEEZING    [provider]  blood glucose meter kit and supplies KIT Dispense based on patient and insurance preference. Use up to four times daily as directed. 10/07/20   Whitmire, Joneen Boers, FNP  blood glucose meter kit and supplies Dispense based on patient and insurance preference. Use up to four times daily as directed. (FOR ICD-10 E10.9, E11.9). Test  twice daily 05/27/18   Dennard Nip D, MD  buPROPion [facility] SR) 150 MG 12 hr tablet Take 1 tablet (150 mg total) by mouth 2 (two) times daily. 08/10/20   Whitmire, Joneen Boers, FNP  cyclobenzaprine (FLEXERIL) 10 MG tablet Take 10 mg by mouth 3 (three) times daily as needed for muscle spasms.    [provider]  Dulaglutide (TRULICITY) 1.5 KV/4.2VZ SOPN Inject 0.5 mLs (1.5 mg total) into the skin once a week. 01/12/20   Whitmire, Joneen Boers, FNP  DULoxetine (CYMBALTA) 30 MG capsule Take 30 mg by mouth 3 (three) times daily. 10/31/17   [provider]  Etanercept (ENBREL [place]) Inject into the skin.    [provider]  folic acid (FOLVITE) 1 MG tablet Take 1 mg by mouth daily.    [provider]  furosemide (LASIX) 20 MG tablet Take 20 mg by mouth daily as needed.    [provider]  gabapentin (NEURONTIN) 300 MG capsule Take 300 mg by mouth 2 (two) times daily.  10/22/17   [provider]  meloxicam (MOBIC) 15 MG tablet Take 15 mg by mouth at bedtime. 09/26/17   [provider]  metFORMIN (GLUCOPHAGE-XR) 500 MG 24 hr tablet TAKE 1 TABLET BY MOUTH  TWICE DAILY 08/02/20   Whitmire, Arrie Aran W, FNP  methotrexate (RHEUMATREX) 2.5 MG tablet Take 2.5 mg by mouth once a week. Take 8 tablets by mouth once a week on Friday. 10/29/17   [provider]  montelukast (SINGULAIR) 10 MG tablet Take 10 mg by mouth at bedtime.    [provider]  Multiple Vitamins-Minerals (HAIR SKIN & NAILS ADVANCED PO) Take 2 tablets by mouth 2 (two) times daily.    [provider]  mupirocin ointment (BACTROBAN) 2 % Place 1 application into the nose 2 (two) times daily as needed.    [provider]  omeprazole (PRILOSEC) 20 MG capsule Take 1 capsule (20 mg total) by mouth daily. 10/27/19   Jearld Lesch A, DO  traMADol (ULTRAM) 50 MG tablet Take 50 mg by mouth 2 (two) times daily as needed for moderate pain.  11/02/17   [provider]  Vitamin D,  Ergocalciferol, (DRISDOL) 1.25 MG (50000 UNIT) CAPS capsule Take 1 capsule (50,000 Units total) by mouth every 7 (seven) days. 10/07/20   Whitmire, Joneen Boers, FNP    Allergies  Patient has no known allergies.  Review of Systems   Review of Systems  Constitutional: Positive for chills and fever.  HENT: Negative for congestion, rhinorrhea and sore throat.   Eyes: Negative for visual disturbance.  Respiratory: Negative for shortness of breath.   Cardiovascular: Negative for chest pain.  Gastrointestinal: Positive for abdominal pain, constipation and nausea. Negative for abdominal distention, anal bleeding, blood in stool, diarrhea, rectal pain and vomiting.  Genitourinary: Negative for decreased urine volume, difficulty urinating, dysuria, flank pain, frequency, genital sores, hematuria, pelvic pain, urgency, vaginal bleeding, vaginal discharge and vaginal pain.  Musculoskeletal: Negative for back pain and neck pain.  Skin: Negative for color change, pallor, rash and wound.  Neurological: Negative for dizziness, syncope, light-headedness and headaches.  Psychiatric/Behavioral: Negative for confusion.    Physical Exam Updated Vital Signs BP (!) 144/73   Pulse (!) 107   Temp (!) 103.1 F (39.5 C) (Oral)   Resp (!) 22   LMP 05/31/2012   SpO2 98%   Physical Exam Vitals and nursing note reviewed.  Constitutional:      General: She is not in acute distress.    Appearance: She is morbidly obese. She is ill-appearing. She is not toxic-appearing or diaphoretic.  HENT:     Head: Normocephalic.  Eyes:     General: No scleral icterus.       Right eye: No discharge.        Left eye: No discharge.  Cardiovascular:     Rate and Rhythm: Tachycardia present.     Heart sounds: Normal heart sounds.     Comments: Tachycardia at rate of 107 Pulmonary:     Effort: Pulmonary effort is normal. No tachypnea, bradypnea or respiratory distress.     Breath sounds: Normal breath sounds. No stridor.   Abdominal:     General: Abdomen is protuberant. Bowel sounds are decreased. There is no distension. There are no signs of injury.     Palpations: Abdomen is soft.     Tenderness: There is generalized abdominal tenderness. There is left CVA tenderness. There is no right CVA tenderness, guarding or rebound.     Hernia: There is no hernia in the umbilical area or ventral area.     Comments: Patient has generalized tenderness throughout her abdomen with increased complaints of tenderness to left lower quadrant  Musculoskeletal:     Cervical back: Neck supple. No rigidity.     Right lower leg: No swelling, deformity, lacerations, tenderness or bony tenderness. No edema.     Left lower leg: No swelling, deformity, lacerations, tenderness or bony tenderness. No edema.  Skin:    General: Skin is warm and dry.     Coloration: Skin is not jaundiced or pale.     Findings: No rash.  Neurological:     General: No focal deficit present.     Mental Status: She is alert.  Psychiatric:        Behavior: Behavior is cooperative.     ED Results / Procedures / Treatments   Labs (all labs ordered are listed, but only abnormal results are displayed) Labs Reviewed  RESP PANEL BY RT-PCR (FLU A&B, COVID) ARPGX2  CULTURE, BLOOD (ROUTINE X 2)  CULTURE, BLOOD (ROUTINE X 2)  URINE CULTURE  CBC WITH DIFFERENTIAL/PLATELET  COMPREHENSIVE METABOLIC PANEL  LIPASE, BLOOD  URINALYSIS, ROUTINE W REFLEX MICROSCOPIC  LACTIC ACID, PLASMA  LACTIC ACID, PLASMA  PROTIME-INR  APTT    EKG EKG Interpretation  Date/Time:  Wednesday November 17 2020 13:26:24 EDT Ventricular Rate:  115 PR Interval:  198 QRS Duration: 80 QT Interval:  301 QTC Calculation: 417 R Axis:   37 Text Interpretation: Sinus tachycardia Biatrial enlargement Since last tracing Right atrial hypertrophy IS NEW SINCE Otherwise no significant change Confirmed by Daleen Bo [phone]) on 11/17/2020 2:20:01 PM   Radiology CT Abdomen Pelvis W  Contrast  Result Date: 11/17/2020 CLINICAL DATA:  Generalized abdominal pain, fever. EXAM: CT ABDOMEN AND PELVIS WITH CONTRAST TECHNIQUE: Multidetector CT imaging of the abdomen and pelvis was performed using the standard protocol following bolus administration of intravenous contrast. CONTRAST:  132m OMNIPAQUE IOHEXOL 300 MG/ML  SOLN COMPARISON:  August 09, 2011. FINDINGS: Lower chest: No acute abnormality. Hepatobiliary: Left hepatic cyst is noted. No gallstones, gallbladder wall thickening, or biliary dilatation. Pancreas: Unremarkable. No pancreatic ductal dilatation or surrounding inflammatory changes. Spleen: Normal in size without focal abnormality. Adrenals/Urinary Tract: Adrenal glands appear normal. Right kidney and ureter are unremarkable. Urinary bladder appears normal. Moderate left hydronephrosis is noted with perinephric stranding secondary to 6 mm proximal left ureteral calculus. Stomach/Bowel: The stomach appears normal. There is no evidence of bowel obstruction or inflammation. Vascular/Lymphatic: No significant vascular findings are present. No enlarged abdominal or pelvic lymph nodes. Reproductive: Uterus and bilateral adnexa are unremarkable. Other: No abdominal wall hernia or abnormality. No abdominopelvic ascites. Musculoskeletal: No acute or significant osseous findings. IMPRESSION: Moderate left hydronephrosis is noted with perinephric stranding secondary to 6 mm proximal left ureteral calculus. Electronically Signed   By: JMarijo ConceptionM.D.   On: 11/17/2020 15:56   DG Chest Port 1 View  Result Date: 11/17/2020 CLINICAL DATA:  Possible sepsis. EXAM: PORTABLE CHEST 1 VIEW COMPARISON:  Chest x-ray dated June 26, 2016. FINDINGS: The heart size and mediastinal contours are within normal limits. Normal pulmonary vascularity. Small spiculated density in the region of the lingula. Minimal right basilar atelectasis. No focal consolidation, pleural effusion, or pneumothorax. No acute  osseous abnormality. IMPRESSION: 1. Small spiculated density in the region of the lingula, not seen on prior studies. Recommend CT chest for further evaluation. Electronically Signed   By: WTitus DubinM.D.   On: 11/17/2020 13:38   DG C-Arm 1-60 Min-No Report  Result Date: 11/17/2020 Fluoroscopy was utilized by the requesting physician.  No radiographic interpretation.    Procedures .Critical Care Performed by: BLoni Beckwith PA-C Authorized by: BLoni Beckwith PA-C   Critical care provider statement:    Critical care time (minutes):  45   Critical care was necessary to treat or prevent imminent or life-threatening deterioration of the following conditions:  Sepsis and shock   Critical care was time spent personally by me on the following activities:  Discussions with consultants, evaluation of patient's response to treatment, examination of patient, ordering and performing treatments and interventions, ordering and review of laboratory studies, ordering and review of radiographic studies, pulse oximetry, re-evaluation of patient's condition, obtaining history from patient or surrogate and review of old charts   Care discussed with: admitting provider      Medications Ordered in ED Medications - No data to display  ED Course  I have reviewed the triage vital signs and the nursing notes.  Pertinent labs & imaging results that were available during my care of the patient were reviewed by me and considered in my medical decision making (see chart for details).  Clinical Course as of 11/17/20 1657  Wed Nov 17, 2020  1403 Informed by them that patient needs alert  to person and place; likely this is secondary to receiving morphine however will check CBG [PB]    Clinical Course User Index [PB] Dyann Ruddle   MDM Rules/Calculators/A&P                          Ill-appearing 60 year old female no acute distress, nontoxic-appearing.  Patient presents with  chief complaint of left lower quadrant pain and fever.  Symptoms have been present since 4/14.  Over this time she has had worsening of pain.  On physical exam patient's abdomen is soft, nondistended, generalized tenderness throughout abdomen with increased complaints of tenderness to left lower quadrant.  Positive left CVA tenderness.  Due to patient's fever and tachycardia and possible intra-abdominal source of infection will initiate ED evolving sepsis protocol. Patient given Tylenol due to her fever of 103.1 F.  Patient given 1 L fluid bolus, morphine, and Zofran.  Chest x-ray shows Small spiculated density in the region of the lingula, not seen on prior studies.  Patient will require CT chest for further evaluation in the future.  CBC shows leukocytosis at 16.5. Lactic acid 1.8. CMP shows no large electrolyte abnormality.  Due to patient's leukocytosis, fever and tachycardia meets criteria for sepsis.  Will initiate antibiotics to cover for possible intra-abdominal infection as patient has generalized abdominal pain with increased tenderness left lower quadrant.  Ceftriaxone and Flagyl ordered.  On serial reexamination patient's abdomen remained soft, nondistended.    CT abdomen pelvis ordered and pending at this time. CT abdomen pelvis showed moderate left hydronephrosis is noted with perinephric stranding secondary to 6 mm proximal left ureteral calculus.  With this finding concern for possible urosepsis.  Urinalysis still pending at this time.  Urine culture ordered.  Due to location and size of ureteral calculi we will consult urology. 1657 spoke with Dr. Diona Fanti who will see the patient.  Advised patient will likely need a stent placed.   1710 spoke to hospitalist Dr. Roosevelt Locks agreed to see the patient for admission.  Despite patient's hydronephrosis we will continue aggressive IV hydration with lactated ringer bolus due to decreasing patient's blood pressure and MAP. Patient's  blood pressure map continue to fluctuate.  We will initiate peripheral pressors and consult critical care.  Hospitalist Dr. Roosevelt Locks was contacted and informed.  Patient was discussed with and evaluated by Dr. Eulis Foster.    Final Clinical Impression(s) / ED Diagnoses Final diagnoses:  Sepsis without acute organ dysfunction, due to unspecified organism [facility])    Rx / DC Orders ED Discharge Orders    None       Dyann Ruddle 11/17/20 2322    Daleen Bo, MD 11/18/20 [phone]

## 2020-11-17 NOTE — Anesthesia Preprocedure Evaluation (Addendum)
Anesthesia Evaluation  Patient identified by MRN, date of birth, ID band Patient awake    Reviewed: Allergy & Precautions, NPO status , Patient's Chart, lab work & pertinent test results  History of Anesthesia Complications Negative for: history of anesthetic complications  Airway Mallampati: III  TM Distance: >3 FB Neck ROM: Full    Dental no notable dental hx. (+) Dental Advisory Given   Pulmonary asthma , former smoker,    Pulmonary exam normal        Cardiovascular + DVT  Normal cardiovascular exam     Neuro/Psych PSYCHIATRIC DISORDERS Anxiety Depression negative neurological ROS     GI/Hepatic Neg liver ROS, GERD  Medicated,  Endo/Other  diabetes  Renal/GU   negative genitourinary   Musculoskeletal  (+) Arthritis , Rheumatoid disorders,  Fibromyalgia -, narcotic dependent  Abdominal   Peds negative pediatric ROS (+)  Hematology negative hematology ROS (+)   Anesthesia Other Findings   Reproductive/Obstetrics negative OB ROS                            Anesthesia Physical Anesthesia Plan  ASA: III and emergent  Anesthesia Plan: MAC   Post-op Pain Management:    Induction: Intravenous  PONV Risk Score and Plan: 3 and Ondansetron and Dexamethasone  Airway Management Planned: Natural Airway  Additional Equipment:   Intra-op Plan:   Post-operative Plan:   Informed Consent: I have reviewed the patients History and Physical, chart, labs and discussed the procedure including the risks, benefits and alternatives for the proposed anesthesia with the patient or authorized representative who has indicated his/her understanding and acceptance.     Dental advisory given and Consent reviewed with POA  Plan Discussed with: Anesthesiologist  Anesthesia Plan Comments:        Anesthesia Quick Evaluation

## 2020-11-17 NOTE — ED Notes (Signed)
Patients belongings placed in bag and given to husband.

## 2020-11-17 NOTE — ED Triage Notes (Signed)
Pt here from MD office with c/o llq pain with fever , no nausea, took her home pain meds with some relief

## 2020-11-17 NOTE — Plan of Care (Signed)

## 2020-11-17 NOTE — Sepsis Progress Note (Signed)
Sepsis protocol is being monitored by [facility]. 

## 2020-11-17 NOTE — Consult Note (Signed)
H&P  Consulting MD: Levon Hedger, MD  Chief Complaint: Left kidney stone with pyelonephrosis/sepsis  History of Present Illness: Sally [name] is a 60 y.o. year old female presenting to the emergency room this afternoon with 1 week history of left flank pain, persistent with fever.  Upon admission to the emergency room the patient was found to have a left ureteral stone, sepsis, and has been hypotensive.  Urologic consultation is requested.  She will be admitted to the critical care service.  Past Medical History:  Diagnosis Date  . ADD (attention deficit disorder)   . Anemia   . Anxiety   . Arthritis   . Asthma   . Back pain   . CFS (chronic fatigue syndrome)   . Colitis   . Depression   . Diabetes (HCC)   . Dyspnea   . Fibromyalgia   . GERD (gastroesophageal reflux disease)   . Hx of blood clots   . IBS (irritable bowel syndrome)   . Joint pain   . Left knee pain   . Leg edema   . Osteoarthritis   . Prediabetes   . Pulmonary embolism (HCC)   . RA (rheumatoid arthritis) (HCC)   . RA (rheumatoid arthritis) (HCC)   . RLS (restless legs syndrome)   . Sleep apnea   . Vitamin B 12 deficiency   . Vitamin D deficiency     Past Surgical History:  Procedure Laterality Date  . EYE SURGERY    . MOUTH SURGERY      Home Medications:  (Not in a hospital admission)   Allergies: No Known Allergies  Family History  Problem Relation Age of Onset  . Pneumonia Mother   . Asthma Mother   . Diabetes Mother   . Hypertension Mother   . Depression Mother   . Anxiety disorder Mother   . Eating disorder Mother   . Obesity Mother   . Sudden death Mother   . Cancer Father        Brain tumor  . Asthma Father     Social History:  reports that she has quit smoking. She has a 12.00 pack-year smoking history. She has never used smokeless tobacco. She reports that she does not drink alcohol and does not use drugs.  ROS: A complete review of systems was performed.  All systems  are negative except for pertinent findings as noted.  Physical Exam:  Vital signs in last 24 hours: Temp:  [100.8 F (38.2 C)-103.2 F (39.6 C)] 100.8 F (38.2 C) (04/20 1435) Pulse Rate:  [100-121] 111 (04/20 1800) Resp:  [15-29] 29 (04/20 1800) BP: (61-172)/(35-93) 73/43 (04/20 1800) SpO2:  [92 %-100 %] 100 % (04/20 1800) Weight:  [116.6 kg] 116.6 kg (04/20 1233) General: Somewhat obtunded, in pain. HEENT: Normocephalic, atraumatic Cardiovascular: Tachycardic Lungs: Normal inspiratory/expiratory excursion Abdomen: Obese, left CVA tenderness Extremities: No edema Neurologic: Grossly intact  Laboratory Data:  Results for orders placed or performed during the hospital encounter of 11/17/20 (from the past 24 hour(s))  CBC with Differential     Status: Abnormal   Collection Time: 11/17/20 12:29 PM  Result Value Ref Range   WBC 16.5 (H) 4.0 - 10.5 K/uL   RBC 3.79 (L) 3.87 - 5.11 MIL/uL   Hemoglobin 11.3 (L) 12.0 - 15.0 g/dL   HCT 69.6 (L) 29.5 - 28.4 %   MCV 93.7 80.0 - 100.0 fL   MCH 29.8 26.0 - 34.0 pg   MCHC 31.8 30.0 - 36.0 g/dL  RDW 15.1 11.5 - 15.5 %   Platelets 304 150 - 400 K/uL   nRBC 0.0 0.0 - 0.2 %   Neutrophils Relative % 92 %   Neutro Abs 15.2 (H) 1.7 - 7.7 K/uL   Lymphocytes Relative 6 %   Lymphs Abs 1.0 0.7 - 4.0 K/uL   Monocytes Relative 1 %   Monocytes Absolute 0.2 0.1 - 1.0 K/uL   Eosinophils Relative 0 %   Eosinophils Absolute 0.0 0.0 - 0.5 K/uL   Basophils Relative 0 %   Basophils Absolute 0.0 0.0 - 0.1 K/uL   Immature Granulocytes 1 %   Abs Immature Granulocytes 0.10 (H) 0.00 - 0.07 K/uL  Comprehensive metabolic panel     Status: Abnormal   Collection Time: 11/17/20 12:29 PM  Result Value Ref Range   Sodium 131 (L) 135 - 145 mmol/L   Potassium 3.5 3.5 - 5.1 mmol/L   Chloride 100 98 - 111 mmol/L   CO2 25 22 - 32 mmol/L   Glucose, Bld 112 (H) 70 - 99 mg/dL   BUN 8 8 - 23 mg/dL   Creatinine, Ser 8.93 0.44 - 1.00 mg/dL   Calcium 8.3 (L) 8.9 -  10.3 mg/dL   Total Protein 6.7 6.5 - 8.1 g/dL   Albumin 3.4 (L) 3.5 - 5.0 g/dL   AST 18 15 - 41 U/L   ALT 19 0 - 44 U/L   Alkaline Phosphatase 68 38 - 126 U/L   Total Bilirubin 0.9 0.3 - 1.2 mg/dL   GFR, Estimated >81 >01 mL/min   Anion gap 6 5 - 15  Lipase, blood     Status: None   Collection Time: 11/17/20 12:29 PM  Result Value Ref Range   Lipase 36 11 - 51 U/L  Lactic acid, plasma     Status: None   Collection Time: 11/17/20  1:00 PM  Result Value Ref Range   Lactic Acid, Venous 1.8 0.5 - 1.9 mmol/L  Protime-INR     Status: None   Collection Time: 11/17/20  1:48 PM  Result Value Ref Range   Prothrombin Time 14.1 11.4 - 15.2 seconds   INR 1.1 0.8 - 1.2  APTT     Status: None   Collection Time: 11/17/20  1:48 PM  Result Value Ref Range   aPTT 34 24 - 36 seconds  Resp Panel by RT-PCR (Flu A&B, Covid) Nasopharyngeal Swab     Status: None   Collection Time: 11/17/20  1:56 PM   Specimen: Nasopharyngeal Swab; Nasopharyngeal(NP) swabs in vial transport medium  Result Value Ref Range   SARS Coronavirus 2 by RT PCR NEGATIVE NEGATIVE   Influenza A by PCR NEGATIVE NEGATIVE   Influenza B by PCR NEGATIVE NEGATIVE  Urinalysis, Routine w reflex microscopic Urine, Catheterized     Status: Abnormal   Collection Time: 11/17/20  5:40 PM  Result Value Ref Range   Color, Urine YELLOW YELLOW   APPearance CLOUDY (A) CLEAR   Specific Gravity, Urine 1.027 1.005 - 1.030   pH 5.0 5.0 - 8.0   Glucose, UA NEGATIVE NEGATIVE mg/dL   Hgb urine dipstick MODERATE (A) NEGATIVE   Bilirubin Urine NEGATIVE NEGATIVE   Ketones, ur NEGATIVE NEGATIVE mg/dL   Protein, ur NEGATIVE NEGATIVE mg/dL   Nitrite NEGATIVE NEGATIVE   Leukocytes,Ua LARGE (A) NEGATIVE   RBC / HPF 6-10 0 - 5 RBC/hpf   WBC, UA >50 (H) 0 - 5 WBC/hpf   Bacteria, UA MANY (A) NONE SEEN  Squamous Epithelial / LPF 0-5 0 - 5   WBC Clumps PRESENT    Mucus PRESENT    Recent Results (from the past 240 hour(s))  Resp Panel by RT-PCR (Flu  A&B, Covid) Nasopharyngeal Swab     Status: None   Collection Time: 11/17/20  1:56 PM   Specimen: Nasopharyngeal Swab; Nasopharyngeal(NP) swabs in vial transport medium  Result Value Ref Range Status   SARS Coronavirus 2 by RT PCR NEGATIVE NEGATIVE Final    Comment: (NOTE) SARS-CoV-2 target nucleic acids are NOT DETECTED.  The SARS-CoV-2 RNA is generally detectable in upper respiratory specimens during the acute phase of infection. The lowest concentration of SARS-CoV-2 viral copies this assay can detect is 138 copies/mL. A negative result does not preclude SARS-Cov-2 infection and should not be used as the sole basis for treatment or other patient management decisions. A negative result may occur with  improper specimen collection/handling, submission of specimen other than nasopharyngeal swab, presence of viral mutation(s) within the areas targeted by this assay, and inadequate number of viral copies(<138 copies/mL). A negative result must be combined with clinical observations, patient history, and epidemiological information. The expected result is Negative.  Fact Sheet for Patients:  BloggerCourse.com  Fact Sheet for Healthcare Providers:  SeriousBroker.it  This test is no t yet approved or cleared by the [place] FDA and  has been authorized for detection and/or diagnosis of SARS-CoV-2 by FDA under an Emergency Use Authorization (EUA). This EUA will remain  in effect (meaning this test can be used) for the duration of the COVID-19 declaration under Section 564(b)(1) of the Act, 21 U.S.C.section 360bbb-3(b)(1), unless the authorization is terminated  or revoked sooner.       Influenza A by PCR NEGATIVE NEGATIVE Final   Influenza B by PCR NEGATIVE NEGATIVE Final    Comment: (NOTE) The Xpert Xpress SARS-CoV-2/FLU/RSV plus assay is intended as an aid in the diagnosis of influenza from Nasopharyngeal swab specimens  and should not be used as a sole basis for treatment. Nasal washings and aspirates are unacceptable for Xpert Xpress SARS-CoV-2/FLU/RSV testing.  Fact Sheet for Patients: BloggerCourse.com  Fact Sheet for Healthcare Providers: SeriousBroker.it  This test is not yet approved or cleared by the [place] FDA and has been authorized for detection and/or diagnosis of SARS-CoV-2 by FDA under an Emergency Use Authorization (EUA). This EUA will remain in effect (meaning this test can be used) for the duration of the COVID-19 declaration under Section 564(b)(1) of the Act, 21 U.S.C. section 360bbb-3(b)(1), unless the authorization is terminated or revoked.  Performed at [facility], [address] [place], [place] [address]    Creatinine: Recent Labs    11/17/20 1229  CREATININE 0.97    Radiologic Imaging: CT Abdomen Pelvis W Contrast  Result Date: 11/17/2020 CLINICAL DATA:  Generalized abdominal pain, fever. EXAM: CT ABDOMEN AND PELVIS WITH CONTRAST TECHNIQUE: Multidetector CT imaging of the abdomen and pelvis was performed using the standard protocol following bolus administration of intravenous contrast. CONTRAST:  OMNIPAQUE IOHEXOL 300 MG/ML  SOLN COMPARISON:  August 09, 2011. FINDINGS: Lower chest: No acute abnormality. Hepatobiliary: Left hepatic cyst is noted. No gallstones, gallbladder wall thickening, or biliary dilatation. Pancreas: Unremarkable. No pancreatic ductal dilatation or surrounding inflammatory changes. Spleen: Normal in size without focal abnormality. Adrenals/Urinary Tract: Adrenal glands appear normal. Right kidney and ureter are unremarkable. Urinary bladder appears normal. Moderate left hydronephrosis is noted with perinephric stranding secondary to 6 mm proximal left ureteral calculus.  Stomach/Bowel: The stomach appears normal. There is no evidence of bowel obstruction or inflammation.  Vascular/Lymphatic: No significant vascular findings are present. No enlarged abdominal or pelvic lymph nodes. Reproductive: Uterus and bilateral adnexa are unremarkable. Other: No abdominal wall hernia or abnormality. No abdominopelvic ascites. Musculoskeletal: No acute or significant osseous findings. IMPRESSION: Moderate left hydronephrosis is noted with perinephric stranding secondary to 6 mm proximal left ureteral calculus. Electronically Signed   By: Lupita Raider M.D.   On: 11/17/2020 15:56   DG Chest Port 1 View  Result Date: 11/17/2020 CLINICAL DATA:  Possible sepsis. EXAM: PORTABLE CHEST 1 VIEW COMPARISON:  Chest x-ray dated June 26, 2016. FINDINGS: The heart size and mediastinal contours are within normal limits. Normal pulmonary vascularity. Small spiculated density in the region of the lingula. Minimal right basilar atelectasis. No focal consolidation, pleural effusion, or pneumothorax. No acute osseous abnormality. IMPRESSION: 1. Small spiculated density in the region of the lingula, not seen on prior studies. Recommend CT chest for further evaluation. Electronically Signed   By: Obie Dredge M.D.   On: 11/17/2020 13:38   I independently reviewed the patient's laboratory results as well as images from her CT scan  Impression/Assessment:  Left upper ureteral stone with associated pyelonephrosis/sepsis  Plan:  I spoke with the patient and her husband who was in the room tonight about proceeding with urgent cystoscopy, retrograde and stent placement on the left.  We will add this onto the operating schedule tonight.  Following this, she will be managed by critical care.  After adequate antibiotic management, on an outpatient basis, we will eventually manage her stone with either lithotripsy or ureteroscopy.  Bertram Millard Gisela Lea 11/17/2020, 6:16 PM  Bertram Millard. Emerald Shor MD

## 2020-11-17 NOTE — H&P (Signed)
History and Physical    Sally [name] IFO:[id number] DOB: 05/15/1961 DOA: 11/17/2020  PCP: Raina Mina., MD (Confirm with patient/family/NH records and if not entered, this has to be entered at [facility] point of entry) Patient coming from: Home  I have personally briefly reviewed patient's old medical records in [place]  Chief Complaint:Left flank pain  HPI: Sally [name] is a 60 y.o. female with medical history significant of diabetes, asthma, anxiety depression, presenting with cute onset of left sided flank pain and fever.  No history of kidney stone, however started to develop episodic left-sided cramping-like abdominal pain since last week. Deniedany urinary frequency..  No chest pain or shortness of breath.  This time, the pain worsened since yesterday evening, feeling nauseous but no vomiting, ED Course: CT abdomen found 6 mm obstructing left-sided ureteral stone with moderate left-sided hydro.  Blood pressure fluctuated.  ED started IV boluses blood pressures slightly improved.  Review of Systems: As per HPI otherwise 14 point review of systems negative.    Past Medical History:  Diagnosis Date  . ADD (attention deficit disorder)   . Anemia   . Anxiety   . Arthritis   . Asthma   . Back pain   . CFS (chronic fatigue syndrome)   . Colitis   . Depression   . Diabetes ([place])   . Dyspnea   . Fibromyalgia   . GERD (gastroesophageal reflux disease)   . Hx of blood clots   . IBS (irritable bowel syndrome)   . Joint pain   . Left knee pain   . Leg edema   . Osteoarthritis   . Prediabetes   . Pulmonary embolism ([place])   . RA (rheumatoid arthritis) ([place])   . RA (rheumatoid arthritis) ([place])   . RLS (restless legs syndrome)   . Sleep apnea   . Vitamin B 12 deficiency   . Vitamin D deficiency     Past Surgical History:  Procedure Laterality Date  . EYE SURGERY    . MOUTH SURGERY       reports that she has quit smoking. She has a 12.00 pack-year smoking  history. She has never used smokeless tobacco. She reports that she does not drink alcohol and does not use drugs.  No Known Allergies  Family History  Problem Relation Age of Onset  . Pneumonia Mother   . Asthma Mother   . Diabetes Mother   . Hypertension Mother   . Depression Mother   . Anxiety disorder Mother   . Eating disorder Mother   . Obesity Mother   . Sudden death Mother   . Cancer Father        Brain tumor  . Asthma Father      Prior to Admission medications   Medication Sig Start Date End Date Taking? Authorizing Provider  albuterol (PROVENTIL HFA;VENTOLIN HFA) 108 (90 BASE) MCG/ACT inhaler Inhale 2 puffs into the lungs every 6 (six) hours as needed. FOR WHEEZING    [provider]  blood glucose meter kit and supplies KIT Dispense based on patient and insurance preference. Use up to four times daily as directed. 10/07/20   Whitmire, Joneen Boers, FNP  blood glucose meter kit and supplies Dispense based on patient and insurance preference. Use up to four times daily as directed. (FOR ICD-10 E10.9, E11.9). Test twice daily 05/27/18   Dennard Nip D, MD  buPROPion [facility] SR) 150 MG 12 hr tablet Take 1 tablet (150 mg total) by  mouth 2 (two) times daily. 08/10/20   Whitmire, Joneen Boers, FNP  cyclobenzaprine (FLEXERIL) 10 MG tablet Take 10 mg by mouth 3 (three) times daily as needed for muscle spasms.    [provider]  Dulaglutide (TRULICITY) 1.5 RC/1.6LA SOPN Inject 0.5 mLs (1.5 mg total) into the skin once a week. 01/12/20   Whitmire, Joneen Boers, FNP  DULoxetine (CYMBALTA) 30 MG capsule Take 30 mg by mouth 3 (three) times daily. 10/31/17   [provider]  Etanercept (ENBREL [place]) Inject into the skin.    [provider]  folic acid (FOLVITE) 1 MG tablet Take 1 mg by mouth daily.    [provider]  furosemide (LASIX) 20 MG tablet Take 20 mg by mouth daily as needed.    [provider]  gabapentin (NEURONTIN) 300 MG capsule Take 300  mg by mouth 2 (two) times daily.  10/22/17   [provider]  meloxicam (MOBIC) 15 MG tablet Take 15 mg by mouth at bedtime. 09/26/17   [provider]  metFORMIN (GLUCOPHAGE-XR) 500 MG 24 hr tablet TAKE 1 TABLET BY MOUTH  TWICE DAILY 08/02/20   Whitmire, Arrie Aran W, FNP  methotrexate (RHEUMATREX) 2.5 MG tablet Take 2.5 mg by mouth once a week. Take 8 tablets by mouth once a week on Friday. 10/29/17   [provider]  montelukast (SINGULAIR) 10 MG tablet Take 10 mg by mouth at bedtime.    [provider]  Multiple Vitamins-Minerals (HAIR SKIN & NAILS ADVANCED PO) Take 2 tablets by mouth 2 (two) times daily.    [provider]  mupirocin ointment (BACTROBAN) 2 % Place 1 application into the nose 2 (two) times daily as needed.    [provider]  omeprazole (PRILOSEC) 20 MG capsule Take 1 capsule (20 mg total) by mouth daily. 10/27/19   Jearld Lesch A, DO  traMADol (ULTRAM) 50 MG tablet Take 50 mg by mouth 2 (two) times daily as needed for moderate pain.  11/02/17   [provider]  Vitamin D, Ergocalciferol, (DRISDOL) 1.25 MG (50000 UNIT) CAPS capsule Take 1 capsule (50,000 Units total) by mouth every 7 (seven) days. 10/07/20   Whitmire, Joneen Boers, FNP    Physical Exam: Vitals:   11/17/20 1740 11/17/20 1745 11/17/20 1800 11/17/20 1837  BP: (!) 61/35 (!) 78/46 (!) 73/43 (!) 82/51  Pulse:  (!) 110 (!) 111 (!) 114  Resp:  18 (!) 29 (!) 24  Temp:    (!) 100.5 F (38.1 C)  TempSrc:    Oral  SpO2:  100% 100% 94%  Weight:      Height:        Constitutional: NAD, calm, comfortable Vitals:   11/17/20 1740 11/17/20 1745 11/17/20 1800 11/17/20 1837  BP: (!) 61/35 (!) 78/46 (!) 73/43 (!) 82/51  Pulse:  (!) 110 (!) 111 (!) 114  Resp:  18 (!) 29 (!) 24  Temp:    (!) 100.5 F (38.1 C)  TempSrc:    Oral  SpO2:  100% 100% 94%  Weight:      Height:       Eyes: PERRL, lids and conjunctivae normal ENMT: Mucous membranes are dry. Posterior pharynx clear  of any exudate or lesions.Normal dentition.  Neck: normal, supple, no masses, no thyromegaly Respiratory: clear to auscultation bilaterally, no wheezing, no crackles. Normal respiratory effort. No accessory muscle use.  Cardiovascular: Regular rate and rhythm, no murmurs / rubs / gallops. No extremity edema. 2+ pedal pulses. No  carotid bruits.  Abdomen: Left CVA tenderness, no masses palpated. No hepatosplenomegaly. Bowel sounds positive.  Musculoskeletal: no clubbing / cyanosis. No joint deformity upper and lower extremities. Good ROM, no contractures. Normal muscle tone.  Skin: no rashes, lesions, ulcers. No induration Neurologic: CN 2-12 grossly intact. Sensation intact, DTR normal. Strength 5/5 in all 4.  Psychiatric: Normal judgment and insight. Alert and oriented x 3. Normal mood.     Labs on Admission: I have personally reviewed following labs and imaging studies  CBC: Recent Labs  Lab 11/17/20 1229  WBC 16.5*  NEUTROABS 15.2*  HGB 11.3*  HCT 35.5*  MCV 93.7  PLT 742   Basic Metabolic Panel: Recent Labs  Lab 11/17/20 1229  NA 131*  K 3.5  CL 100  CO2 25  GLUCOSE 112*  BUN 8  CREATININE 0.97  CALCIUM 8.3*   GFR: Estimated Creatinine Clearance: 76.4 mL/min (by C-G formula based on SCr of 0.97 mg/dL). Liver Function Tests: Recent Labs  Lab 11/17/20 1229  AST 18  ALT 19  ALKPHOS 68  BILITOT 0.9  PROT 6.7  ALBUMIN 3.4*   Recent Labs  Lab 11/17/20 1229  LIPASE 36   No results for input(s): AMMONIA in the last 168 hours. Coagulation Profile: Recent Labs  Lab 11/17/20 1348  INR 1.1   Cardiac Enzymes: No results for input(s): CKTOTAL, CKMB, CKMBINDEX, TROPONINI in the last 168 hours. BNP (last 3 results) No results for input(s): PROBNP in the last 8760 hours. HbA1C: No results for input(s): HGBA1C in the last 72 hours. CBG: No results for input(s): GLUCAP in the last 168 hours. Lipid Profile: No results for input(s): CHOL, HDL, LDLCALC, TRIG,  CHOLHDL, LDLDIRECT in the last 72 hours. Thyroid Function Tests: No results for input(s): TSH, T4TOTAL, FREET4, T3FREE, THYROIDAB in the last 72 hours. Anemia Panel: No results for input(s): VITAMINB12, FOLATE, FERRITIN, TIBC, IRON, RETICCTPCT in the last 72 hours. Urine analysis:    Component Value Date/Time   COLORURINE YELLOW 11/17/2020 1740   APPEARANCEUR CLOUDY (A) 11/17/2020 1740   LABSPEC 1.027 11/17/2020 1740   PHURINE 5.0 11/17/2020 1740   GLUCOSEU NEGATIVE 11/17/2020 1740   HGBUR MODERATE (A) 11/17/2020 1740   BILIRUBINUR NEGATIVE 11/17/2020 1740   KETONESUR NEGATIVE 11/17/2020 1740   PROTEINUR NEGATIVE 11/17/2020 1740   UROBILINOGEN 0.2 04/24/2014 1530   NITRITE NEGATIVE 11/17/2020 1740   LEUKOCYTESUR LARGE (A) 11/17/2020 1740    Radiological Exams on Admission: CT Abdomen Pelvis W Contrast  Result Date: 11/17/2020 CLINICAL DATA:  Generalized abdominal pain, fever. EXAM: CT ABDOMEN AND PELVIS WITH CONTRAST TECHNIQUE: Multidetector CT imaging of the abdomen and pelvis was performed using the standard protocol following bolus administration of intravenous contrast. CONTRAST:  193mL OMNIPAQUE IOHEXOL 300 MG/ML  SOLN COMPARISON:  August 09, 2011. FINDINGS: Lower chest: No acute abnormality. Hepatobiliary: Left hepatic cyst is noted. No gallstones, gallbladder wall thickening, or biliary dilatation. Pancreas: Unremarkable. No pancreatic ductal dilatation or surrounding inflammatory changes. Spleen: Normal in size without focal abnormality. Adrenals/Urinary Tract: Adrenal glands appear normal. Right kidney and ureter are unremarkable. Urinary bladder appears normal. Moderate left hydronephrosis is noted with perinephric stranding secondary to 6 mm proximal left ureteral calculus. Stomach/Bowel: The stomach appears normal. There is no evidence of bowel obstruction or inflammation. Vascular/Lymphatic: No significant vascular findings are present. No enlarged abdominal or pelvic lymph  nodes. Reproductive: Uterus and bilateral adnexa are unremarkable. Other: No abdominal wall hernia or abnormality. No abdominopelvic ascites. Musculoskeletal: No acute or significant osseous findings.  IMPRESSION: Moderate left hydronephrosis is noted with perinephric stranding secondary to 6 mm proximal left ureteral calculus. Electronically Signed   By: Marijo Conception M.D.   On: 11/17/2020 15:56   DG Chest Port 1 View  Result Date: 11/17/2020 CLINICAL DATA:  Possible sepsis. EXAM: PORTABLE CHEST 1 VIEW COMPARISON:  Chest x-ray dated June 26, 2016. FINDINGS: The heart size and mediastinal contours are within normal limits. Normal pulmonary vascularity. Small spiculated density in the region of the lingula. Minimal right basilar atelectasis. No focal consolidation, pleural effusion, or pneumothorax. No acute osseous abnormality. IMPRESSION: 1. Small spiculated density in the region of the lingula, not seen on prior studies. Recommend CT chest for further evaluation. Electronically Signed   By: Titus Dubin M.D.   On: 11/17/2020 13:38    EKG: Independently reviewed.  Sinus tachycardia  Assessment/Plan Active Problems:   Sepsis ([place])  (please populate well all problems here in Problem List. (For example, if patient is on BP meds at home and you resume or decide to hold them, it is a problem that needs to be her. Same for CAD, COPD, HLD and so on)  Severe sepsis -Evidenced by fever, tachycardia and leukocytosis, blood pressure remains borderline hypotensive after IV boluses 3 L.  Discussed with ED nurse at bedside regarding starting pressors and transfer patient to ICU in case blood pressure not further picking up after completion of the full boluses.  Infection source is complicated UTI/acute pyelonephritis. -Agreed with ceftriaxone for UTI.  Acute left-sided hydronephrosis secondary to obstructing left ureteral stone -Urology contacted and plan for left sided ureteral stenting, will keep  patient n.p.o. for now.  HTN -Hold all home BP meds  IDDM -Sliding scale for now -Hold Metformin  DVT prophylaxis: Lovenox Code Status: Full Code Family Communication: None at bedside Disposition Plan: Expect more than 2 midnight hospital stay to treat obstructive ureteral stone and urosepsis. Consults called: Urology Admission status: PCU   Lequita Halt MD [facility] Pager [phone]  11/17/2020, 6:50 PM

## 2020-11-18 ENCOUNTER — Encounter (HOSPITAL_COMMUNITY): Payer: Self-pay | Admitting: Urology

## 2020-11-18 LAB — CBC
HCT: 29.6 % — ABNORMAL LOW (ref 36.0–46.0)
Hemoglobin: 9.3 g/dL — ABNORMAL LOW (ref 12.0–15.0)
MCH: 29.8 pg (ref 26.0–34.0)
MCHC: 31.4 g/dL (ref 30.0–36.0)
MCV: 94.9 fL (ref 80.0–100.0)
Platelets: 217 10*3/uL (ref 150–400)
RBC: 3.12 MIL/uL — ABNORMAL LOW (ref 3.87–5.11)
RDW: 15.2 % (ref 11.5–15.5)
WBC: 21.5 10*3/uL — ABNORMAL HIGH (ref 4.0–10.5)
nRBC: 0 % (ref 0.0–0.2)

## 2020-11-18 LAB — BLOOD CULTURE ID PANEL (REFLEXED) - BCID2

## 2020-11-18 LAB — PHOSPHORUS: Phosphorus: 2.4 mg/dL — ABNORMAL LOW (ref 2.5–4.6)

## 2020-11-18 LAB — GLUCOSE, CAPILLARY
Glucose-Capillary: 77 mg/dL (ref 70–99)
Glucose-Capillary: 112 mg/dL — ABNORMAL HIGH (ref 70–99)
Glucose-Capillary: 130 mg/dL — ABNORMAL HIGH (ref 70–99)
Glucose-Capillary: 115 mg/dL — ABNORMAL HIGH (ref 70–99)
Glucose-Capillary: 156 mg/dL — ABNORMAL HIGH (ref 70–99)
Glucose-Capillary: 110 mg/dL — ABNORMAL HIGH (ref 70–99)

## 2020-11-18 LAB — MAGNESIUM: Magnesium: 1.2 mg/dL — ABNORMAL LOW (ref 1.7–2.4)

## 2020-11-18 LAB — HIV ANTIBODY (ROUTINE TESTING W REFLEX): HIV Screen 4th Generation wRfx: NONREACTIVE

## 2020-11-18 LAB — BASIC METABOLIC PANEL
Anion gap: 11 (ref 5–15)
BUN: 12 mg/dL (ref 8–23)
CO2: 21 mmol/L — ABNORMAL LOW (ref 22–32)
Calcium: 7.7 mg/dL — ABNORMAL LOW (ref 8.9–10.3)
Chloride: 101 mmol/L (ref 98–111)
Creatinine, Ser: 1.6 mg/dL — ABNORMAL HIGH (ref 0.44–1.00)
GFR, Estimated: 36 mL/min — ABNORMAL LOW (ref >60)
Glucose, Bld: 83 mg/dL (ref 70–99)
Potassium: 3.8 mmol/L (ref 3.5–5.1)
Sodium: 133 mmol/L — ABNORMAL LOW (ref 135–145)

## 2020-11-18 LAB — MRSA PCR SCREENING: MRSA by PCR: NEGATIVE

## 2020-11-18 MED ORDER — DULOXETINE HCL 30 MG PO CPEP
30.0000 mg | ORAL_CAPSULE | Freq: Three times a day (TID) | ORAL | Status: DC
  Administered 2020-11-18 – 2020-11-21 (×10): 30 mg via ORAL
  Filled 2020-11-18 (×11): qty 1

## 2020-11-18 MED ORDER — BUPROPION HCL ER (SR) 150 MG PO TB12
150.0000 mg | ORAL_TABLET | Freq: Every day | ORAL | Status: DC
  Administered 2020-11-18 – 2020-11-21 (×4): 150 mg via ORAL
  Filled 2020-11-18 (×4): qty 1

## 2020-11-18 MED ORDER — MAGNESIUM SULFATE 2 GM/50ML IV SOLN
2.0000 g | Freq: Once | INTRAVENOUS | Status: AC
  Administered 2020-11-18: 2 g via INTRAVENOUS
  Filled 2020-11-18: qty 50

## 2020-11-18 MED ORDER — CHLORHEXIDINE GLUCONATE CLOTH 2 % EX PADS
6.0000 | MEDICATED_PAD | Freq: Every day | CUTANEOUS | Status: DC
  Administered 2020-11-18 – 2020-11-21 (×3): 6 via TOPICAL

## 2020-11-18 MED ORDER — ENOXAPARIN SODIUM 60 MG/0.6ML ~~LOC~~ SOLN
60.0000 mg | SUBCUTANEOUS | Status: DC
  Administered 2020-11-18 – 2020-11-20 (×3): 60 mg via SUBCUTANEOUS
  Filled 2020-11-18 (×3): qty 0.6

## 2020-11-18 NOTE — Progress Notes (Signed)
NAME:  Sally [name], MRN:  [id number], DOB:  1961/03/16, LOS: 1 ADMISSION DATE:  11/17/2020, CONSULTATION DATE:  11/17/20 REFERRING MD:  Tegeler, CHIEF COMPLAINT:  Urosepsis    History of Present Illness:  Sally [name] is a 60 y.o. female with a relevant PMHx of RA, DM, asthma, anxiety/depression, IBS, fibromyalgia that was admitted for urosepsis in the setting of a left upper ureteral stone (49m by CTAP). Patient presented to ED after a week of left flank pain and was found to be hypotensive and have evidence of a UTI. Subjective fever/chills night PTA. No hematuria. CTAP revealed a stone and urology placed a double J-stent and foley cath. Patient has no prior history of nephrolithiasis but notes that her mother had stones. Last dose of PRN lasix was approximately one month ago.    Pertinent  Medical History  RA on etanercept - methotrexate, recently stopped  DM on metformin, dulaglutide  Peripheral edema taking lasix PRN  Asthma  Anxiety/Depression IBS, colitis  Fibromyalgia   Significant Hospital Events: Including procedures, antibiotic start and stop dates in addition to other pertinent events   - 4/20 admitted for urosepsis   Interim History / Subjective:  NAEON. Patient describes some back pain similar to what she had PTA. Describes a slight headache as well. Otherwise no pain. No other concerns/complaints.    Objective   Blood pressure 110/69, pulse 78, temperature 98.3 F (36.8 C), temperature source Oral, resp. rate 16, height 5' 6"  (1.676 m), weight 126.8 kg, last menstrual period 05/31/2012, SpO2 97 %.        Intake/Output Summary (Last 24 hours) at 11/18/2020 1106 Last data filed at 11/18/2020 0900 Gross per 24 hour  Intake 10227.8 ml  Output 2450 ml  Net 7777.8 ml   Filed Weights   11/17/20 1233 11/17/20 2100 11/18/20 0114  Weight: 116.6 kg 126.2 kg 126.8 kg    Examination: General: NAD, obese, body habitus limits exam  HENT: MMM, NCAT  Lungs: CTAB in  anterior fields but sounds distant Cardiovascular: Heart sounds distant, nml s1/s2, no peripheral edema, extremities warm  Abdomen: Soft, NTND Extremities: no rashes appreciated, SCD in place  Neuro: Awake, oriented, strength at least 3/5 in all extremities  GU: Foley in place  Labs/imaging that I havepersonally reviewed  (right click and "Reselect all SmartList Selections" daily)  CBC CMP Blood/urine cultures   Resolved Hospital Problem list   NA  Assessment & Plan:  AREENA BORROMEOis a 60y.o. female with a relevant PMHx of RA, DM, asthma, anxiety/depression, IBS, fibromyalgia that was admitted for urosepsis in the setting of a left upper ureteral stone (653mby CTAP). Patient required initial vasopressor support but has been able to weaned significantly since this morning. Will defer to urology for management of ureteral stone. Prior to discharge, medication reconciliation will be needed to reduce risk of nephrolithiasis recurrence (particularly metformin and lasix PRN).   Urosepsis  Blood culture significant for GNR: enterobacter and Ecoli as well as a UA remarkable for leukocytes and many bacteria. Ucx still outstanding. WBC still significantly elevated.  - Continue with Ceftriaxone  - Urology following, appreciate recommendations - ID following, appreciate recommendations  - D/c maintenance LR  - Bolus LR PRN     - Pt expected to be fluid replete, but limited physical exam   AKI  Cr with upward trend (0.97 > 1.6) in setting of normal BUN likely indicates an intrinsic process 2/2 pyelonephritis/urosepsis.  - CTM   Decreased  Hemoglobin  Hemoglobin trended down (11.3 > 9.3) overnight in the setting of significant fluid resuscitation. Likely 2/2 hemodilution, as no concern for a retroperitoneal hemorrhage following urology stent procedure at this time. Will CTM.  - Trend Hb - CTM for signs of HDS change   Electrolyte Derangements - Replete PRN   NIDDM  - Continue w/ SSI  PRN   Rheumatoid Arthritis - Hold home meds   Anxiety/Depression  Fibromyalgia Will resume home regimen of Cymbalta and Wellbutrin. CTM for serotonin syndrome.  - Expect vasopressors to be weaned this afternoon       Best practice (right click and "Reselect all SmartList Selections" daily)  Diet:  NPO Pain/Anxiety/Delirium protocol (if indicated): No VAP protocol (if indicated): Not indicated DVT prophylaxis: LMWH and SCD GI prophylaxis: N/A Glucose control:  SSI Yes Central venous access:  N/A Arterial line:  N/A Foley:  Yes, and it is still needed Mobility:  bed rest  PT consulted: N/A Last date of multidisciplinary goals of care discussion $RemoveBeforeD'[]'[id number]$  Code Status:  full code Disposition: ICU  Labs   CBC: Recent Labs  Lab 11/17/20 1229 11/18/20 0114  WBC 16.5* 21.5*  NEUTROABS 15.2*  --   HGB 11.3* 9.3*  HCT 35.5* 29.6*  MCV 93.7 94.9  PLT 304 643    Basic Metabolic Panel: Recent Labs  Lab 11/17/20 1229 11/18/20 0114  NA 131* 133*  K 3.5 3.8  CL 100 101  CO2 25 21*  GLUCOSE 112* 83  BUN 8 12  CREATININE 0.97 1.60*  CALCIUM 8.3* 7.7*  MG  --  1.2*  PHOS  --  2.4*   GFR: Estimated Creatinine Clearance: 50.3 mL/min (A) (by C-G formula based on SCr of 1.6 mg/dL (H)). Recent Labs  Lab 11/17/20 1229 11/17/20 1300 11/18/20 0114  WBC 16.5*  --  21.5*  LATICACIDVEN  --  1.8  --     Liver Function Tests: Recent Labs  Lab 11/17/20 1229  AST 18  ALT 19  ALKPHOS 68  BILITOT 0.9  PROT 6.7  ALBUMIN 3.4*   Recent Labs  Lab 11/17/20 1229  LIPASE 36   No results for input(s): AMMONIA in the last 168 hours.  ABG No results found for: PHART, PCO2ART, PO2ART, HCO3, TCO2, ACIDBASEDEF, O2SAT   Coagulation Profile: Recent Labs  Lab 11/17/20 1348  INR 1.1    Cardiac Enzymes: No results for input(s): CKTOTAL, CKMB, CKMBINDEX, TROPONINI in the last 168 hours.  HbA1C: Hgb A1c MFr Bld  Date/Time Value Ref Range Status  10/07/2020 02:21 PM 5.8 (H)  4.8 - 5.6 % Final    Comment:             Prediabetes: 5.7 - 6.4          Diabetes: >6.4          Glycemic control for adults with diabetes: <7.0   01/12/2020 01:14 PM 5.8 (H) 4.8 - 5.6 % Final    Comment:             Prediabetes: 5.7 - 6.4          Diabetes: >6.4          Glycemic control for adults with diabetes: <7.0     CBG: Recent Labs  Lab 11/17/20 2004 11/17/20 2357 11/18/20 0400 11/18/20 0743  GLUCAP 116* 73 112* 130*    Review of Systems:     Past Medical History:  She,  has a past medical history of ADD (attention deficit disorder),  Anemia, Anxiety, Arthritis, Asthma, Back pain, CFS (chronic fatigue syndrome), Colitis, Depression, Diabetes ([place]), Dyspnea, Fibromyalgia, GERD (gastroesophageal reflux disease), blood clots, IBS (irritable bowel syndrome), Joint pain, Left knee pain, Leg edema, Osteoarthritis, Prediabetes, Pulmonary embolism (HCC), RA (rheumatoid arthritis) ([place]), RA (rheumatoid arthritis) ([place]), RLS (restless legs syndrome), Sleep apnea, Vitamin B 12 deficiency, and Vitamin D deficiency.   Surgical History:   Past Surgical History:  Procedure Laterality Date  . CYSTOSCOPY W/ URETERAL STENT PLACEMENT Left 11/17/2020   Procedure: CYSTOSCOPY LEFT WITH RETROGRADE PYELOGRAM/URETERAL STENT PLACEMENT LEFT DOUBLE J PLACEMENT ;  Surgeon: Franchot Gallo, MD;  Location: [facility];  Service: Urology;  Laterality: Left;  . EYE SURGERY    . MOUTH SURGERY       Social History:   reports that she has quit smoking. She has a 12.00 pack-year smoking history. She has never used smokeless tobacco. She reports that she does not drink alcohol and does not use drugs.   Family History:  Her family history includes Anxiety disorder in her mother; Asthma in her father and mother; Cancer in her father; Depression in her mother; Diabetes in her mother; Eating disorder in her mother; Hypertension in her mother; Obesity in her mother; Pneumonia in her mother; Sudden death in her  mother.   Allergies No Known Allergies   Home Medications  Prior to Admission medications   Medication Sig Start Date End Date Taking? Authorizing Provider  albuterol (PROVENTIL HFA;VENTOLIN HFA) 108 (90 BASE) MCG/ACT inhaler Inhale 2 puffs into the lungs every 6 (six) hours as needed. FOR WHEEZING   Yes [provider]  buPROPion (WELLBUTRIN SR) 150 MG 12 hr tablet Take 1 tablet (150 mg total) by mouth 2 (two) times daily. Patient taking differently: Take 150 mg by mouth daily. 08/10/20  Yes Whitmire, Joneen Boers, FNP  cyclobenzaprine (FLEXERIL) 10 MG tablet Take 10 mg by mouth 3 (three) times daily as needed for muscle spasms.   Yes [provider]  DULoxetine (CYMBALTA) 30 MG capsule Take 90 mg by mouth daily. 10/31/17  Yes [provider]  Etanercept (ENBREL [place]) Inject into the skin. Inject dose once per week (on Saturdays)   Yes [provider]  furosemide (LASIX) 20 MG tablet Take 20 mg by mouth daily as needed.   Yes [provider]  gabapentin (NEURONTIN) 300 MG capsule Take 600 mg by mouth 3 (three) times daily. 2 capsules in the AM, 1 capsule at lunch as needed, 2 capsules at bedtime 10/22/17  Yes [provider]  metFORMIN (GLUCOPHAGE-XR) 500 MG 24 hr tablet TAKE 1 TABLET BY MOUTH  TWICE DAILY 08/02/20  Yes Whitmire, Dawn W, FNP  montelukast (SINGULAIR) 10 MG tablet Take 10 mg by mouth at bedtime.   Yes [provider]  Multiple Vitamins-Minerals (HAIR SKIN & NAILS ADVANCED PO) Take 2 tablets by mouth 2 (two) times daily.   Yes [provider]  omeprazole (PRILOSEC) 20 MG capsule Take 1 capsule (20 mg total) by mouth daily. 10/27/19  Yes Jearld Lesch A, DO  predniSONE (DELTASONE) 20 MG tablet Take 20 mg by mouth daily. 09/24/20  Yes [provider]  traMADol (ULTRAM) 50 MG tablet Take 50 mg by mouth 2 (two) times daily as needed for moderate pain.  11/02/17  Yes [provider]  Vitamin D, Ergocalciferol,  (DRISDOL) 1.25 MG (50000 UNIT) CAPS capsule Take 1 capsule (50,000 Units total) by mouth every 7 (seven) days. 10/07/20  Yes Whitmire, Joneen Boers, FNP  blood glucose  meter kit and supplies KIT Dispense based on patient and insurance preference. Use up to four times daily as directed. 10/07/20   Whitmire, Joneen Boers, FNP  blood glucose meter kit and supplies Dispense based on patient and insurance preference. Use up to four times daily as directed. (FOR ICD-10 E10.9, E11.9). Test twice daily 05/27/18   Dennard Nip D, MD  Dulaglutide (TRULICITY) 1.5 PH/1.5AV SOPN Inject 0.5 mLs (1.5 mg total) into the skin once a week. Patient not taking: Reported on 11/18/2020 01/12/20   Whitmire, Joneen Boers, FNP  folic acid (FOLVITE) 1 MG tablet Take 1 mg by mouth daily.    [provider]  meloxicam (MOBIC) 15 MG tablet Take 15 mg by mouth at bedtime. Patient not taking: Reported on 11/18/2020 09/26/17   [provider]  methotrexate (RHEUMATREX) 2.5 MG tablet Take 2.5 mg by mouth once a week. Take 8 tablets by mouth once a week on Friday. Patient not taking: Reported on 11/18/2020 10/29/17   [provider]  mupirocin ointment (BACTROBAN) 2 % Place 1 application into the nose 2 (two) times daily as needed. Patient not taking: Reported on 11/18/2020    [provider]     Critical care time: 40 minutes    Domenick Bookbinder, MS4

## 2020-11-18 NOTE — Progress Notes (Signed)
PHARMACY - PHYSICIAN COMMUNICATION CRITICAL VALUE ALERT - BLOOD CULTURE IDENTIFICATION (BCID)  Sally [name] is an 60 y.o. female who presented to [facility] on 11/17/2020 with a chief complaint of left sided flank pain and fever  Assessment:  Bacteremia with urinary source. Blood cultures positive for 2 of 3 sets. Aerobic and Anaerobic. Growing E.coli with no resistance mechanisms per BCID  Name of physician (or Provider) Contacted: Dewald, J  Current antibiotics: Ceftriaxone 2gm IV q24h  Changes to prescribed antibiotics recommended:  No change recommended.   Sally [name], PharmD, BCPS, FNKF Clinical Pharmacist [place] Please utilize Amion for appropriate phone number to reach the unit pharmacist [facility])   11/18/2020  7:45 AM

## 2020-11-18 NOTE — Progress Notes (Signed)
Patient remained on pressors overnight. Discussed with PCCM, will sign off.  Please let us know when we can be of additional assistance.

## 2020-11-18 NOTE — Plan of Care (Signed)
?  Problem: Clinical Measurements: ?Goal: Ability to maintain clinical measurements within normal limits will improve ?Outcome: Progressing ?Goal: Will remain free from infection ?Outcome: Progressing ?Goal: Diagnostic test results will improve ?Outcome: Progressing ?  ?

## 2020-11-18 NOTE — Progress Notes (Signed)
eLink Physician-Brief Progress Note Patient Name: Sally [name] DOB: August 02, 1960 MRN: [id number]   Date of Service  11/18/2020  HPI/Events of Note  Hypomagnesemia - Mg++ = 1.2 and Creatinine = 1.6.  eICU Interventions  Will replace Mg++.     Intervention Category Major Interventions: Electrolyte abnormality - evaluation and management  [name] 11/18/2020, 2:26 AM

## 2020-11-19 LAB — BASIC METABOLIC PANEL
Anion gap: 6 (ref 5–15)
BUN: 11 mg/dL (ref 8–23)
CO2: 25 mmol/L (ref 22–32)
Calcium: 7.8 mg/dL — ABNORMAL LOW (ref 8.9–10.3)
Chloride: 105 mmol/L (ref 98–111)
Creatinine, Ser: 0.95 mg/dL (ref 0.44–1.00)
GFR, Estimated: >60 mL/min (ref >60)
Glucose, Bld: 109 mg/dL — ABNORMAL HIGH (ref 70–99)
Potassium: 4.2 mmol/L (ref 3.5–5.1)
Sodium: 136 mmol/L (ref 135–145)

## 2020-11-19 LAB — CBC
HCT: 30.2 % — ABNORMAL LOW (ref 36.0–46.0)
Hemoglobin: 9.6 g/dL — ABNORMAL LOW (ref 12.0–15.0)
MCH: 30.1 pg (ref 26.0–34.0)
MCHC: 31.8 g/dL (ref 30.0–36.0)
MCV: 94.7 fL (ref 80.0–100.0)
Platelets: 180 10*3/uL (ref 150–400)
RBC: 3.19 MIL/uL — ABNORMAL LOW (ref 3.87–5.11)
RDW: 15.5 % (ref 11.5–15.5)
WBC: 24.9 10*3/uL — ABNORMAL HIGH (ref 4.0–10.5)
nRBC: 0.1 % (ref 0.0–0.2)

## 2020-11-19 LAB — PHOSPHORUS: Phosphorus: 2 mg/dL — ABNORMAL LOW (ref 2.5–4.6)

## 2020-11-19 LAB — GLUCOSE, CAPILLARY
Glucose-Capillary: 129 mg/dL — ABNORMAL HIGH (ref 70–99)
Glucose-Capillary: 114 mg/dL — ABNORMAL HIGH (ref 70–99)
Glucose-Capillary: 109 mg/dL — ABNORMAL HIGH (ref 70–99)
Glucose-Capillary: 118 mg/dL — ABNORMAL HIGH (ref 70–99)

## 2020-11-19 LAB — MAGNESIUM: Magnesium: 2.2 mg/dL (ref 1.7–2.4)

## 2020-11-19 MED ORDER — PROCHLORPERAZINE EDISYLATE 10 MG/2ML IJ SOLN
10.0000 mg | Freq: Once | INTRAMUSCULAR | Status: AC
  Administered 2020-11-19: 10 mg via INTRAVENOUS
  Filled 2020-11-19: qty 2

## 2020-11-19 MED ORDER — METOCLOPRAMIDE HCL 5 MG/ML IJ SOLN
10.0000 mg | Freq: Four times a day (QID) | INTRAMUSCULAR | Status: AC
  Administered 2020-11-19 (×2): 10 mg via INTRAVENOUS
  Filled 2020-11-19 (×2): qty 2

## 2020-11-19 MED ORDER — SODIUM PHOSPHATES 45 MMOLE/15ML IV SOLN
30.0000 mmol | Freq: Once | INTRAVENOUS | Status: AC
  Administered 2020-11-19: 30 mmol via INTRAVENOUS
  Filled 2020-11-19: qty 10

## 2020-11-19 MED ORDER — POLYETHYLENE GLYCOL 3350 17 G PO PACK
17.0000 g | PACK | Freq: Every day | ORAL | Status: DC
  Administered 2020-11-19 – 2020-11-21 (×3): 17 g via ORAL
  Filled 2020-11-19 (×2): qty 1

## 2020-11-19 NOTE — Progress Notes (Addendum)
NAME:  Sally [name], MRN:  [id number], DOB:  Jan 05, 1961, LOS: 2 ADMISSION DATE:  11/17/2020, CONSULTATION DATE:  11/17/20 REFERRING MD:  Tegeler, CHIEF COMPLAINT:  Urosepsis    History of Present Illness:  Sally [name] is a 60 y.o. female with a relevant PMHx of RA, DM, asthma, anxiety/depression, IBS, fibromyalgia that was admitted for urosepsis in the setting of a left upper ureteral stone (46m by CTAP). Patient presented to ED after a week of left flank pain and was found to be hypotensive and have evidence of a UTI. Subjective fever/chills night PTA. No hematuria. CTAP revealed a stone and urology placed a double J-stent and foley cath. Patient has no prior history of nephrolithiasis but notes that her mother had stones. Last dose of PRN lasix was approximately one month ago.    Pertinent  Medical History  RA on etanercept - methotrexate, recently stopped  DM on metformin, dulaglutide  Peripheral edema taking lasix PRN  Asthma  Anxiety/Depression IBS, colitis  Fibromyalgia   Significant Hospital Events: Including procedures, antibiotic start and stop dates in addition to other pertinent events   - 4/20 admitted for urosepsis  - 4/22 off vasopressor support  Interim History / Subjective:  NAEON. Patient had a HA overnight and 2 bouts of emesis. Describes HA as diffuse, dully/achy "annoying", and behind eyes. Denies auras. Improved this morning s/p tylenol at 0500. Flank pain improved. Appears well. No other concerns/complaints.    Patient reports ASA makes her sick.   Objective   Blood pressure (!) 119/91, pulse 78, temperature 98.2 F (36.8 C), temperature source Oral, resp. rate 16, height 5' 6"  (1.676 m), weight 123.5 kg, last menstrual period 05/31/2012, SpO2 96 %.        Intake/Output Summary (Last 24 hours) at 11/19/2020 0851 Last data filed at 11/19/2020 0600 Gross per 24 hour  Intake 1125.79 ml  Output 4430 ml  Net -3304.21 ml   Filed Weights   11/17/20 2100  11/18/20 0114 11/19/20 0500  Weight: 126.2 kg 126.8 kg 123.5 kg    Examination: General: NAD, obese, body habitus limits exam  HENT: MMM, NCAT  Lungs: CTAB in anterior fields but sounds distant Cardiovascular: Heart sounds distant, nml s1/s2, no peripheral edema, extremities warm  Abdomen: Soft, NTND Extremities: no rashes appreciated, SCD in place  Neuro: Awake, oriented, strength at least 3/5 in all extremities  GU: Foley in place  Labs/imaging that I havepersonally reviewed  (right click and "Reselect all SmartList Selections" daily)  CBC CMP Blood/urine cultures   Resolved Hospital Problem list   NA  Assessment & Plan:  Sally NUTTERis a 60y.o. female with a relevant PMHx of RA, DM, asthma, anxiety/depression, IBS, fibromyalgia that was admitted for urosepsis in the setting of a left upper ureteral stone (693mby CTAP). Patient required initial vasopressor support but has been able to weaned off support. Will defer to urology for management of ureteral stone. Patient is ready for transfer to the floor   Prior to discharge, medication reconciliation will be needed to reduce risk of nephrolithiasis recurrence (particularly metformin and lasix PRN).   UTI  Nephrolithiasis  Resolved Urosepsis  Blood culture significant for GNR: enterobacter and Ecoli as well as a UA remarkable for leukocytes and many bacteria. Ucx still outstanding. WBC still significantly elevated. Afebrile since evening of 4/20. - Continue with Ceftriaxone (4/21 - )    - Transition to PO regimen per sensitivities  - Urology following, appreciate recommendations - Bolus  LR PRN     - Pt expected to be fluid replete, but limited physical exam   AKI  Cr demonstrating resolution of AKI (0.97 > 1.6 > 0.95). In setting of normal BUN likely indicates an intrinsic process 2/2 pyelonephritis/urosepsis.  - CTM   Decreased Hemoglobin  Hemoglobin stabled at lower level (11.3 > 9.3 > 6.6) in the setting of  significant fluid resuscitation. Likely 2/2 hemodilution, as no concern for a retroperitoneal hemorrhage following urology stent procedure at this time. Will CTM.  - Trend Hb - CTM for signs of HDS change   Electrolyte Derangements - Replete PRN   NIDDM  - Continue w/ SSI PRN   Rheumatoid Arthritis - Hold home meds   Anxiety/Depression  Fibromyalgia Will resume home regimen of Cymbalta and Wellbutrin. CTM for serotonin syndrome.  - Expect vasopressors to be weaned this afternoon       Best practice (right click and "Reselect all SmartList Selections" daily)  Diet:  NPO Pain/Anxiety/Delirium protocol (if indicated): No VAP protocol (if indicated): Not indicated DVT prophylaxis: LMWH and SCD GI prophylaxis: N/A Glucose control:  SSI Yes Central venous access:  N/A Arterial line:  N/A Foley:  Yes, and it is still needed Mobility:  bed rest  PT consulted: N/A Last date of multidisciplinary goals of care discussion []  Code Status:  full code Disposition: ICU  Labs   CBC: Recent Labs  Lab 11/17/20 1229 11/18/20 0114 11/19/20 0056  WBC 16.5* 21.5* 24.9*  NEUTROABS 15.2*  --   --   HGB 11.3* 9.3* 9.6*  HCT 35.5* 29.6* 30.2*  MCV 93.7 94.9 94.7  PLT 304 217 272    Basic Metabolic Panel: Recent Labs  Lab 11/17/20 1229 11/18/20 0114 11/19/20 0056  NA 131* 133* 136  K 3.5 3.8 4.2  CL 100 101 105  CO2 25 21* 25  GLUCOSE 112* 83 109*  BUN 8 12 11   CREATININE 0.97 1.60* 0.95  CALCIUM 8.3* 7.7* 7.8*  MG  --  1.2* 2.2  PHOS  --  2.4* 2.0*   GFR: Estimated Creatinine Clearance: 83.4 mL/min (by C-G formula based on SCr of 0.95 mg/dL). Recent Labs  Lab 11/17/20 1229 11/17/20 1300 11/18/20 0114 11/19/20 0056  WBC 16.5*  --  21.5* 24.9*  LATICACIDVEN  --  1.8  --   --     Liver Function Tests: Recent Labs  Lab 11/17/20 1229  AST 18  ALT 19  ALKPHOS 68  BILITOT 0.9  PROT 6.7  ALBUMIN 3.4*   Recent Labs  Lab 11/17/20 1229  LIPASE 36   No  results for input(s): AMMONIA in the last 168 hours.  ABG No results found for: PHART, PCO2ART, PO2ART, HCO3, TCO2, ACIDBASEDEF, O2SAT   Coagulation Profile: Recent Labs  Lab 11/17/20 1348  INR 1.1    Cardiac Enzymes: No results for input(s): CKTOTAL, CKMB, CKMBINDEX, TROPONINI in the last 168 hours.  HbA1C: Hgb A1c MFr Bld  Date/Time Value Ref Range Status  10/07/2020 02:21 PM 5.8 (H) 4.8 - 5.6 % Final    Comment:             Prediabetes: 5.7 - 6.4          Diabetes: >6.4          Glycemic control for adults with diabetes: <7.0   01/12/2020 01:14 PM 5.8 (H) 4.8 - 5.6 % Final    Comment:             Prediabetes: 5.7 -  6.4          Diabetes: >6.4          Glycemic control for adults with diabetes: <7.0     CBG: Recent Labs  Lab 11/18/20 0743 11/18/20 1145 11/18/20 1508 11/18/20 2041 11/19/20 0718  GLUCAP 130* 115* 156* 110* 129*    Review of Systems:     Past Medical History:  She,  has a past medical history of ADD (attention deficit disorder), Anemia, Anxiety, Arthritis, Asthma, Back pain, CFS (chronic fatigue syndrome), Colitis, Depression, Diabetes ([place]), Dyspnea, Fibromyalgia, GERD (gastroesophageal reflux disease), blood clots, IBS (irritable bowel syndrome), Joint pain, Left knee pain, Leg edema, Osteoarthritis, Prediabetes, Pulmonary embolism (HCC), RA (rheumatoid arthritis) ([place]), RA (rheumatoid arthritis) ([place]), RLS (restless legs syndrome), Sleep apnea, Vitamin B 12 deficiency, and Vitamin D deficiency.   Surgical History:   Past Surgical History:  Procedure Laterality Date  . CYSTOSCOPY W/ URETERAL STENT PLACEMENT Left 11/17/2020   Procedure: CYSTOSCOPY LEFT WITH RETROGRADE PYELOGRAM/URETERAL STENT PLACEMENT LEFT DOUBLE J PLACEMENT ;  Surgeon: Franchot Gallo, MD;  Location: [facility];  Service: Urology;  Laterality: Left;  . EYE SURGERY    . MOUTH SURGERY       Social History:   reports that she has quit smoking. She has a 12.00 pack-year smoking  history. She has never used smokeless tobacco. She reports that she does not drink alcohol and does not use drugs.   Family History:  Her family history includes Anxiety disorder in her mother; Asthma in her father and mother; Cancer in her father; Depression in her mother; Diabetes in her mother; Eating disorder in her mother; Hypertension in her mother; Obesity in her mother; Pneumonia in her mother; Sudden death in her mother.   Allergies No Known Allergies   Home Medications  Prior to Admission medications   Medication Sig Start Date End Date Taking? Authorizing Provider  albuterol (PROVENTIL HFA;VENTOLIN HFA) 108 (90 BASE) MCG/ACT inhaler Inhale 2 puffs into the lungs every 6 (six) hours as needed. FOR WHEEZING   Yes [provider]  buPROPion (WELLBUTRIN SR) 150 MG 12 hr tablet Take 1 tablet (150 mg total) by mouth 2 (two) times daily. Patient taking differently: Take 150 mg by mouth daily. 08/10/20  Yes Whitmire, Joneen Boers, FNP  cyclobenzaprine (FLEXERIL) 10 MG tablet Take 10 mg by mouth 3 (three) times daily as needed for muscle spasms.   Yes [provider]  DULoxetine (CYMBALTA) 30 MG capsule Take 90 mg by mouth daily. 10/31/17  Yes [provider]  Etanercept (ENBREL [place]) Inject into the skin. Inject dose once per week (on Saturdays)   Yes [provider]  furosemide (LASIX) 20 MG tablet Take 20 mg by mouth daily as needed.   Yes [provider]  gabapentin (NEURONTIN) 300 MG capsule Take 600 mg by mouth 3 (three) times daily. 2 capsules in the AM, 1 capsule at lunch as needed, 2 capsules at bedtime 10/22/17  Yes [provider]  metFORMIN (GLUCOPHAGE-XR) 500 MG 24 hr tablet TAKE 1 TABLET BY MOUTH  TWICE DAILY 08/02/20  Yes Whitmire, Dawn W, FNP  montelukast (SINGULAIR) 10 MG tablet Take 10 mg by mouth at bedtime.   Yes [provider]  Multiple Vitamins-Minerals (HAIR SKIN & NAILS ADVANCED PO) Take 2 tablets by mouth 2 (two) times  daily.   Yes [provider]  omeprazole (PRILOSEC) 20 MG capsule Take 1 capsule (20 mg total) by mouth daily. 10/27/19  Yes Jearld Lesch  A, DO  predniSONE (DELTASONE) 20 MG tablet Take 20 mg by mouth daily. 09/24/20  Yes [provider]  traMADol (ULTRAM) 50 MG tablet Take 50 mg by mouth 2 (two) times daily as needed for moderate pain.  11/02/17  Yes [provider]  Vitamin D, Ergocalciferol, (DRISDOL) 1.25 MG (50000 UNIT) CAPS capsule Take 1 capsule (50,000 Units total) by mouth every 7 (seven) days. 10/07/20  Yes Whitmire, Dawn W, FNP  blood glucose meter kit and supplies KIT Dispense based on patient and insurance preference. Use up to four times daily as directed. 10/07/20   Whitmire, Joneen Boers, FNP  blood glucose meter kit and supplies Dispense based on patient and insurance preference. Use up to four times daily as directed. (FOR ICD-10 E10.9, E11.9). Test twice daily 05/27/18   Dennard Nip D, MD  Dulaglutide (TRULICITY) 1.5 VP/3.6UZ SOPN Inject 0.5 mLs (1.5 mg total) into the skin once a week. Patient not taking: Reported on 11/18/2020 01/12/20   Whitmire, Joneen Boers, FNP  folic acid (FOLVITE) 1 MG tablet Take 1 mg by mouth daily.    [provider]  meloxicam (MOBIC) 15 MG tablet Take 15 mg by mouth at bedtime. Patient not taking: Reported on 11/18/2020 09/26/17   [provider]  methotrexate (RHEUMATREX) 2.5 MG tablet Take 2.5 mg by mouth once a week. Take 8 tablets by mouth once a week on Friday. Patient not taking: Reported on 11/18/2020 10/29/17   [provider]  mupirocin ointment (BACTROBAN) 2 % Place 1 application into the nose 2 (two) times daily as needed. Patient not taking: Reported on 11/18/2020    [provider]     Critical care time: 30 minutes    Domenick Bookbinder, MS4

## 2020-11-19 NOTE — TOC Initial Note (Signed)
Transition of Care [facility]) - Initial/Assessment Note    Patient Details  Name: Sally [name] MRN: [id number] Date of Birth: 22-Feb-1961  Transition of Care [facility]) CM/SW Contact:    Lockie Pares, RN Phone Number: 11/19/2020, 2:22 PM  Clinical Narrative:                 ICU, post pyelonephritis, sepsis weaned off pressors. PT consult for assessment. May need HH , assist devices. Will see what they recommend. CM will follow for needs.  Expected Discharge Plan: Home w Home Health Services Barriers to Discharge: Continued Medical Work up   Patient Goals and CMS Choice        Expected Discharge Plan and Services Expected Discharge Plan: Home w Home Health Services       Living arrangements for the past 2 months: Single Family Home                                      Prior Living Arrangements/Services Living arrangements for the past 2 months: Single Family Home Lives with:: Spouse Patient language and need for interpreter reviewed:: Yes        Need for Family Participation in Patient Care: Yes (Comment) Care giver support system in place?: Yes (comment)   Criminal Activity/Legal Involvement Pertinent to Current Situation/Hospitalization: No - Comment as needed  Activities of Daily Living Home Assistive Devices/Equipment: None ADL Screening (condition at time of admission) Patient's cognitive ability adequate to safely complete daily activities?: Yes Is the patient deaf or have difficulty hearing?: No Does the patient have difficulty seeing, even when wearing glasses/contacts?: Yes Does the patient have difficulty concentrating, remembering, or making decisions?: No Patient able to express need for assistance with ADLs?: No Does the patient have difficulty dressing or bathing?: No Independently performs ADLs?: Yes (appropriate for developmental age) Does the patient have difficulty walking or climbing stairs?: No Weakness of Legs: None Weakness of  Arms/Hands: None  Permission Sought/Granted                  Emotional Assessment       Orientation: : Oriented to Situation,Oriented to  Time,Oriented to Place,Oriented to Self Alcohol / Substance Use: Not Applicable Psych Involvement: No (comment)  Admission diagnosis:  Sepsis (HCC) [A41.9] Patient Active Problem List   Diagnosis Date Noted  . Sepsis (HCC) 11/17/2020  . Elevated blood pressure reading without diagnosis of hypertension 06/24/2020  . Depression 04/27/2020  . Diabetes mellitus (HCC) 11/10/2019  . Class 3 severe obesity with serious comorbidity and body mass index (BMI) of 45.0 to 49.9 in adult (HCC) 10/28/2019  . Vitamin D deficiency 10/23/2019  . Colitis   . RLS (restless legs syndrome)   . IBS (irritable bowel syndrome)    PCP:  Gordan Payment., MD Pharmacy:   [facility] [address] [place] - [address] [place] [address] Phone: [phone] Fax: [phone]  [id number] - [place], [place] - [address] [facility] [address] [place] [address] Phone: [phone] Fax: [phone]  [facility]) [facility] - [place], [place] - [address] [place] [address] Phone: [phone] Fax: [phone]     Social Determinants of Health (SDOH) Interventions    Readmission Risk Interventions No flowsheet data found.

## 2020-11-19 NOTE — Progress Notes (Signed)
eLink Physician-Brief Progress Note Patient Name: Sally [name] DOB: 31-May-1961 MRN: [id number]   Date of Service  11/19/2020  HPI/Events of Note  Multiple issues: 1. Hypophosphatemia - PO4--- = 2.0 and K+ = 4.2 and 2. Vomiting - too earyl to give ordered Zofran.    eICU Interventions  Plan: 1. Pharmacy to enter order for sodium phosphate replacement.  2. Compazine 10 mg IV X 1 now.      Intervention Category Major Interventions: Electrolyte abnormality - evaluation and management;Other:  [name] 11/19/2020, 5:59 AM

## 2020-11-20 LAB — BASIC METABOLIC PANEL
Anion gap: 7 (ref 5–15)
BUN: 6 mg/dL — ABNORMAL LOW (ref 8–23)
CO2: 27 mmol/L (ref 22–32)
Calcium: 8.3 mg/dL — ABNORMAL LOW (ref 8.9–10.3)
Chloride: 102 mmol/L (ref 98–111)
Creatinine, Ser: 0.69 mg/dL (ref 0.44–1.00)
GFR, Estimated: >60 mL/min (ref >60)
Glucose, Bld: 112 mg/dL — ABNORMAL HIGH (ref 70–99)
Potassium: 4 mmol/L (ref 3.5–5.1)
Sodium: 136 mmol/L (ref 135–145)

## 2020-11-20 LAB — GLUCOSE, CAPILLARY
Glucose-Capillary: 106 mg/dL — ABNORMAL HIGH (ref 70–99)
Glucose-Capillary: 110 mg/dL — ABNORMAL HIGH (ref 70–99)
Glucose-Capillary: 104 mg/dL — ABNORMAL HIGH (ref 70–99)
Glucose-Capillary: 155 mg/dL — ABNORMAL HIGH (ref 70–99)

## 2020-11-20 LAB — CULTURE, BLOOD (ROUTINE X 2)

## 2020-11-20 LAB — PHOSPHORUS: Phosphorus: 2.6 mg/dL (ref 2.5–4.6)

## 2020-11-20 LAB — CBC
HCT: 34.3 % — ABNORMAL LOW (ref 36.0–46.0)
Hemoglobin: 11.1 g/dL — ABNORMAL LOW (ref 12.0–15.0)
MCH: 29.7 pg (ref 26.0–34.0)
MCHC: 32.4 g/dL (ref 30.0–36.0)
MCV: 91.7 fL (ref 80.0–100.0)
Platelets: 181 10*3/uL (ref 150–400)
RBC: 3.74 MIL/uL — ABNORMAL LOW (ref 3.87–5.11)
RDW: 14.8 % (ref 11.5–15.5)
WBC: 24.6 10*3/uL — ABNORMAL HIGH (ref 4.0–10.5)
nRBC: 0 % (ref 0.0–0.2)

## 2020-11-20 LAB — URINE CULTURE: Culture: >=100000 — AB

## 2020-11-20 MED ORDER — CEFAZOLIN SODIUM-DEXTROSE 2-4 GM/100ML-% IV SOLN
2.0000 g | Freq: Three times a day (TID) | INTRAVENOUS | Status: DC
  Administered 2020-11-20 – 2020-11-21 (×3): 2 g via INTRAVENOUS
  Filled 2020-11-20 (×5): qty 100

## 2020-11-20 MED ORDER — SODIUM CHLORIDE 0.9 % IV SOLN: INTRAVENOUS | Status: DC

## 2020-11-20 MED ORDER — MELATONIN 5 MG PO TABS
5.0000 mg | ORAL_TABLET | Freq: Once | ORAL | Status: AC
  Administered 2020-11-20: 5 mg via ORAL
  Filled 2020-11-20: qty 1

## 2020-11-20 NOTE — Plan of Care (Signed)

## 2020-11-20 NOTE — Progress Notes (Signed)
PROGRESS NOTE  Sally [name]  DOB: 04/01/1961  PCP: Sally [name]., MD PIR:[id number]  DOA: 11/17/2020  LOS: 3 days   Chief Complaint:Left flank pain  Brief narrative: Sally [name] is a 60 y.o. female with PMH significant for DM2, rheumatoid arthritis, asthma, IBS, colitis, anxiety, depression, fibromyalgia. Patient presented to the ED on 4/20 with complaint of left flank pain, fever for a week with nausea. In the ED, she was hypotensive, tachycardic, with elevated WBC count, noted to have UTI. CT abdomen pelvis showed a 6 mm obstructing left-sided ureteral stone with moderate left-sided hydronephrosis. Urology was consulted and a double-J stent was placed. She was admitted to ICU service for septic shock, required pressors, off pressors on 4/22 Transferred out to hospitalist service on 4/23  Subjective: Patient was seen and examined this morning.  Pleasant middle-aged morbidly obese female.  Propped up in bed.  Feels uncomfortable.  No specific complaint.  Hoping to go home today but understands the need to stay longer. Chart reviewed. Blood pressure 163/82 this morning, no fever. Lab this morning with WBC count elevated at 24.6.  Assessment/Plan: Septic shock - POA Left pyonephritis Obstructive left ureteric stone  Bacteremia with Enterobacter and E. coli -Source controlled with placement of double-J stent -Afebrile since the evening of 4/20 but WBC count remains elevated.   -Currently on IV Rocephin. Recent Labs  Lab 11/17/20 1229 11/17/20 1300 11/18/20 0114 11/19/20 0056 11/20/20 0106  WBC 16.5*  --  21.5* 24.9* 24.6*  LATICACIDVEN  --  1.8  --   --   --    Persistent leukocytosis -WBC count has been trending up since admission without fever.  Remains on antibiotics. Repeat blood culture today. -May need to repeat CT scan to rule out abscess if does not improve tomorrow.  AKI -Creatinine trend as below.  Currently normal Recent Labs    01/12/20 1314  11/17/20 1229 11/18/20 0114 11/19/20 0056 11/20/20 0106  BUN 19 8 12 11  6*  CREATININE 0.64 0.97 1.60* 0.95 0.69   Hypomagnesemia/hypophosphatemia -Replaced adequately.  Normal levels this morning.  Continue to monitor Recent Labs  Lab 11/17/20 1229 11/18/20 0114 11/19/20 0056 11/20/20 0106  K 3.5 3.8 4.2 4.0  MG  --  1.2* 2.2  --   PHOS  --  2.4* 2.0* 2.6   Decreased hemoglobin -Hemoglobin slightly dropped from 11.3 on admission to the lowest of 9.6 on 4/22, probably because of hemodilution.  Hemoglobin improved to 11.1 on repeat blood work today. Recent Labs    11/17/20 1229 11/18/20 0114 11/19/20 0056 11/20/20 0106  HGB 11.3* 9.3* 9.6* 11.1*  MCV 93.7 94.9 94.7 91.7   Type 2 diabetes mellitus -A1c 5.8 on 10/07/20 -Currently blood sugar level controlled on sliding scale insulin. -On metformin 500 mg twice daily at home.  Currently on hold. Recent Labs  Lab 11/19/20 0718 11/19/20 1133 11/19/20 1526 11/19/20 2228 11/20/20 0755  GLUCAP 129* 114* 109* 118* 106*   Rheumatoid Arthritis -At home, on etanercept weekly.  Methotrexate recently stopped.  Anxiety/Depression/Fibromyalgia -At home on Bupropion 150 mg daily, Cymbalta 90 mg daily, Neurontin 600 mg 3 times daily, as needed tramadol. -Resumed.  Peripheral edema  -On lasix PRN   Asthma  -On Singulair and as needed albuterol  GERD Prilosec 20 mg daily  Morbid obesity  -Body mass index is 43.5 kg/m. Patient has been advised to make an attempt to improve diet and exercise patterns to aid in weight loss.  Mobility: Needs PT evaluation  Code Status:   Code Status: Full Code  Nutritional status: Body mass index is 43.5 kg/m.     Diet Order            Diet heart healthy/carb modified Room service appropriate? Yes; Fluid consistency: Thin  Diet effective now                 DVT prophylaxis: SCDs Start: 11/17/20 1754   Antimicrobials:  IV Rocephin Fluid: NS at 75 mill per hour Consultants:  Urology, critical care Family Communication:  None at bedside  Status is: Inpatient  Remains inpatient appropriate because: Continues to have leukocytosis, may need further imaging  Dispo: The patient is from: Home              Anticipated d/c is to: Home in next 2 to 3 days, pending PT eval              Patient currently is not medically stable to d/c.   Difficult to place patient No Infusions:  . sodium chloride 250 mL (11/18/20 0300)  . sodium chloride    . cefTRIAXone (ROCEPHIN)  IV 2 g (11/19/20 1526)  . sodium chloride 999 mL/hr at 11/18/20 0400    Scheduled Meds: . buPROPion  150 mg Oral Daily  . Chlorhexidine Gluconate Cloth  6 each Topical Q0600  . DULoxetine  30 mg Oral TID  . enoxaparin (LOVENOX) injection  60 mg Subcutaneous Q24H  . folic acid  1 mg Oral Daily  . gabapentin  300 mg Oral BID  . insulin aspart  0-15 Units Subcutaneous TID WC  . montelukast  10 mg Oral QHS  . pantoprazole  40 mg Oral Daily  . polyethylene glycol  17 g Oral Daily    Antimicrobials: Anti-infectives (From admission, onward)   Start     Dose/Rate Route Frequency Ordered Stop   11/18/20 1600  cefTRIAXone (ROCEPHIN) 2 g in sodium chloride 0.9 % 100 mL IVPB        2 g 200 mL/hr over 30 Minutes Intravenous Every 24 hours 11/17/20 1757     11/17/20 1415  cefTRIAXone (ROCEPHIN) 2 g in sodium chloride 0.9 % 100 mL IVPB        2 g 200 mL/hr over 30 Minutes Intravenous  Once 11/17/20 1403 11/17/20 1711   11/17/20 1415  metroNIDAZOLE (FLAGYL) IVPB 500 mg        500 mg 100 mL/hr over 60 Minutes Intravenous  Once 11/17/20 1403 11/17/20 1542      PRN meds: acetaminophen **OR** acetaminophen, albuterol, cyclobenzaprine, docusate sodium, HYDROmorphone (DILAUDID) injection, ondansetron **OR** ondansetron (ZOFRAN) IV, oxyCODONE   Objective: Vitals:   11/20/20 0513 11/20/20 0948  BP: (!) 163/82 (!) 167/85  Pulse: 95 75  Resp: 18 18  Temp: 98 F (36.7 C) 97.8 F (36.6 C)  SpO2: 93% 100%     Intake/Output Summary (Last 24 hours) at 11/20/2020 1040 Last data filed at 11/20/2020 0520 Gross per 24 hour  Intake 837 ml  Output 2800 ml  Net -1963 ml   Filed Weights   11/18/20 0114 11/19/20 0500 11/20/20 0513  Weight: 126.8 kg 123.5 kg 122.2 kg   Weight change: -1.256 kg Body mass index is 43.5 kg/m.   Physical Exam: General exam: Pleasant, middle-aged morbidly obese Caucasian female. Skin: No rashes, lesions or ulcers. HEENT: Atraumatic, normocephalic, no obvious bleeding Lungs: Clear to auscultation bilaterally CVS: Regular rate and rhythm, no murmur GI/Abd soft, distended from obesity, nontender, bowel sound present  CNS: Alert, awake, oriented x3 Psychiatry: Mood appropriate Extremities: No pedal edema, no calf tenderness  Data Review: I have personally reviewed the laboratory data and studies available.  Recent Labs  Lab 11/17/20 1229 11/18/20 0114 11/19/20 0056 11/20/20 0106  WBC 16.5* 21.5* 24.9* 24.6*  NEUTROABS 15.2*  --   --   --   HGB 11.3* 9.3* 9.6* 11.1*  HCT 35.5* 29.6* 30.2* 34.3*  MCV 93.7 94.9 94.7 91.7  PLT 304 217 180 181   Recent Labs  Lab 11/17/20 1229 11/18/20 0114 11/19/20 0056 11/20/20 0106  NA 131* 133* 136 136  K 3.5 3.8 4.2 4.0  CL 100 101 105 102  CO2 25 21* 25 27  GLUCOSE 112* 83 109* 112*  BUN 8 12 11  6*  CREATININE 0.97 1.60* 0.95 0.69  CALCIUM 8.3* 7.7* 7.8* 8.3*  MG  --  1.2* 2.2  --   PHOS  --  2.4* 2.0* 2.6    F/u labs ordered Unresulted Labs (From admission, onward)          Start     Ordered   11/20/20 1037  Culture, blood (routine x 2)  BLOOD CULTURE X 2,   R      11/20/20 1036   11/20/20 0500  CBC  Daily,   R     Question:  Specimen collection method  Answer:  Lab=Lab collect   11/19/20 0953   11/20/20 0500  Basic metabolic panel  Daily,   R     Question:  Specimen collection method  Answer:  Lab=Lab collect   11/19/20 11/21/20          Signed, [id number], MD [facility] 11/20/2020

## 2020-11-20 NOTE — Evaluation (Signed)
Physical Therapy Evaluation Patient Details Name: Sally [name] MRN: [id number] DOB: Dec 20, 1960 Today's Date: 11/20/2020   History of Present Illness  Pt is a 60 y.o. F who presents with complaint of left flank pain, fever and nausea. CT abdomen pelvis showing 6 mm obstructing left sided ureteral stone with moderate left sided hydronephrosis. Urology was consulted and a double J stent was placed. WBC count has been trending up since admission without fever. Significant PMH: obesity, DM2, RA, fibromyalgia.  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 On PT evaluation, pt ambulating x 100 feet with a RW at a supervision level. SpO2 91-94% on RA. Pt reporting improved stability with walker compared to without. Presents with decreased endurance and weakness. Would benefit from a Rollator for energy conservation. Do not anticipate need for PT follow up. Education provided regarding exercise recommendations and potential work restrictions.     Follow Up Recommendations No PT follow up;Supervision for mobility/OOB    Equipment Recommendations  Other (comment) (Rollator)    Recommendations for Other Services       Precautions / Restrictions Precautions Precautions: Fall Restrictions Weight Bearing Restrictions: No      Mobility  Bed Mobility               General bed mobility comments: Sitting EOB upon arrival    Transfers Overall transfer level: Needs assistance Equipment used: None Transfers: Sit to/from Stand Sit to Stand: Supervision            Ambulation/Gait Ambulation/Gait assistance: Supervision Gait Distance (Feet): 100 Feet Assistive device: Rolling walker (2 wheeled);None Gait Pattern/deviations: Step-through pattern;Decreased stride length;Antalgic Gait velocity: decreased   General Gait Details: Pt initally ambulated first ~5 ft with no AD, preferred use of RW for additional external support. Pt demonstrates  slow and steady gait, mildly antalgic (pt with chronic left knee pain). Cues provided for walker use and activity pacing  Stairs            Wheelchair Mobility    Modified Rankin (Stroke Patients Only)       Balance Overall balance assessment: Mild deficits observed, not formally tested                                           Pertinent Vitals/Pain Pain Assessment: No/denies pain    Home Living Family/patient expects to be discharged to:: Private residence Living Arrangements: Spouse/significant other Available Help at Discharge: Family Type of Home: House Home Access: Stairs to enter   Secretary/administrator of Steps: 2 (small steps) Home Layout: Able to live on main level with bedroom/bathroom Home Equipment: Shower seat;Hand held shower head;Grab bars - tub/shower      Prior Function Level of Independence: Independent         Comments: Works in Therapist, art        Extremity/Trunk Assessment   Upper Extremity Assessment Upper Extremity Assessment: Overall WFL for tasks assessed    Lower Extremity Assessment Lower Extremity Assessment: LLE deficits/detail LLE Deficits / Details: History of left knee pain       Communication   Communication: No difficulties  Cognition Arousal/Alertness: Awake/alert Behavior During Therapy: WFL for tasks assessed/performed Overall Cognitive Status: Within Functional Limits for tasks assessed  General Comments      Exercises     Assessment/Plan    PT Assessment Patient needs continued PT services  PT Problem List Decreased strength;Decreased activity tolerance;Decreased balance;Decreased mobility;Pain       PT Treatment Interventions Gait training;DME instruction;Stair training;Functional mobility training;Therapeutic activities;Therapeutic exercise;Balance training;Patient/family education    PT Goals (Current goals  can be found in the Care Plan section)  Acute Rehab PT Goals Patient Stated Goal: to return home PT Goal Formulation: With patient Time For Goal Achievement: 12/04/20 Potential to Achieve Goals: Good    Frequency Min 3X/week   Barriers to discharge        Co-evaluation               AM-PAC PT "6 Clicks" Mobility  Outcome Measure Help needed turning from your back to your side while in a flat bed without using bedrails?: None Help needed moving from lying on your back to sitting on the side of a flat bed without using bedrails?: None Help needed moving to and from a bed to a chair (including a wheelchair)?: A Little Help needed standing up from a chair using your arms (e.g., wheelchair or bedside chair)?: A Little Help needed to walk in hospital room?: A Little Help needed climbing 3-5 steps with a railing? : A Little 6 Click Score: 20    End of Session   Activity Tolerance: Patient tolerated treatment well Patient left: in bed;with call bell/phone within reach;with family/visitor present Nurse Communication: Mobility status PT Visit Diagnosis: Unsteadiness on feet (R26.81);Difficulty in walking, not elsewhere classified (R26.2)    Time: 5284-1324 PT Time Calculation (min) (ACUTE ONLY): 17 min   Charges:   PT Evaluation $PT Eval Moderate Complexity: 1 Mod          Sally [name], PT, DPT Acute Rehabilitation Services Pager [phone] Office [phone]   Sally [name] 11/20/2020, 3:58 PM

## 2020-11-21 LAB — BASIC METABOLIC PANEL
Anion gap: 6 (ref 5–15)
BUN: 10 mg/dL (ref 8–23)
CO2: 28 mmol/L (ref 22–32)
Calcium: 8.6 mg/dL — ABNORMAL LOW (ref 8.9–10.3)
Chloride: 104 mmol/L (ref 98–111)
Creatinine, Ser: 0.79 mg/dL (ref 0.44–1.00)
GFR, Estimated: >60 mL/min (ref >60)
Glucose, Bld: 118 mg/dL — ABNORMAL HIGH (ref 70–99)
Potassium: 4.5 mmol/L (ref 3.5–5.1)
Sodium: 138 mmol/L (ref 135–145)

## 2020-11-21 LAB — CBC
HCT: 30.4 % — ABNORMAL LOW (ref 36.0–46.0)
Hemoglobin: 10 g/dL — ABNORMAL LOW (ref 12.0–15.0)
MCH: 29.9 pg (ref 26.0–34.0)
MCHC: 32.9 g/dL (ref 30.0–36.0)
MCV: 90.7 fL (ref 80.0–100.0)
Platelets: 212 10*3/uL (ref 150–400)
RBC: 3.35 MIL/uL — ABNORMAL LOW (ref 3.87–5.11)
RDW: 14.9 % (ref 11.5–15.5)
WBC: 11.4 10*3/uL — ABNORMAL HIGH (ref 4.0–10.5)
nRBC: 0 % (ref 0.0–0.2)

## 2020-11-21 LAB — GLUCOSE, CAPILLARY
Glucose-Capillary: 112 mg/dL — ABNORMAL HIGH (ref 70–99)
Glucose-Capillary: 145 mg/dL — ABNORMAL HIGH (ref 70–99)

## 2020-11-21 MED ORDER — TRAMADOL HCL 50 MG PO TABS: 50.0000 mg | ORAL_TABLET | Freq: Four times a day (QID) | ORAL | 0 refills | Status: AC | PRN

## 2020-11-21 MED ORDER — CEPHALEXIN 500 MG PO CAPS: 500.0000 mg | ORAL_CAPSULE | Freq: Three times a day (TID) | ORAL | 0 refills | Status: AC

## 2020-11-21 MED ORDER — SACCHAROMYCES BOULARDII 250 MG PO CAPS: 250.0000 mg | ORAL_CAPSULE | Freq: Two times a day (BID) | ORAL | 0 refills | Status: AC

## 2020-11-21 NOTE — Progress Notes (Signed)
Foley discontinued at 1000 per Dr. Pola Corn who spoke with Dr. Annette Stable and was told that it was okay for floor nurse to discontinue foley.

## 2020-11-21 NOTE — Discharge Summary (Addendum)
Physician Discharge Summary  Sally [name] BTD:[id number] DOB: 1961/04/19 DOA: 11/17/2020  PCP: Raina Mina., MD  Admit date: 11/17/2020 Discharge date: 11/21/2020  Admitted From: Home Discharge disposition: Home   Code Status: Full Code  Diet Recommendation: Diabetic diet  Discharge Diagnosis:   Active Problems:   Sepsis [facility])  Chief Complaint: Left flank pain  Brief narrative: Sally [name] is a 60 y.o. female with PMH significant for DM2, rheumatoid arthritis, asthma, IBS, colitis, anxiety, depression, fibromyalgia. Patient presented to the ED on 4/20 with complaint of left flank pain, fever for a week with nausea. In the ED, she was hypotensive, tachycardic, with elevated WBC count, noted to have UTI. CT abdomen pelvis showed a 6 mm obstructing left-sided ureteral stone with moderate left-sided hydronephrosis. Urology was consulted and a double-J stent was placed. She was admitted to ICU service for septic shock, required pressors, off pressors on 4/22 Transferred out to hospitalist service on 4/23  Subjective: Patient was seen and examined this morning. Pleasant middle-aged morbidly obese female.  Sitting up in chair.  Not in distress.  Foley catheter taken out today. Passed voiding trial WBC count significantly better this morning. Feels ready to go home.  Hospital course: Septic shock due to E. coli- POA Left pyonephritis Obstructive left ureteric stone  Bacteremia with Enterobacter and E. coli -Source controlled with placement of double-J stent -Afebrile since the evening of 4/20.  Last 24 hours, WBC count has improved from 25-11. -Discussed with urologist Dr. Gloriann Loan this morning.  Foley catheter removed.  Patient to follow-up with urology as an outpatient for stent removal and lithotripsy. -Currently on IV Ancef.  We will plan to discharge home on 2 weeks course of oral Keflex with probiotics. Recent Labs  Lab 11/17/20 1229 11/17/20 1300 11/18/20 0114  11/19/20 0056 11/20/20 0106 11/21/20 0149  WBC 16.5*  --  21.5* 24.9* 24.6* 11.4*  LATICACIDVEN  --  1.8  --   --   --   --    AKI -Creatinine trend as below.  Currently normal. Recent Labs    01/12/20 1314 11/17/20 1229 11/18/20 0114 11/19/20 0056 11/20/20 0106 11/21/20 0149  BUN 19 8 12 11  6* 10  CREATININE 0.64 0.97 1.60* 0.95 0.69 0.79   Hypomagnesemia/hypophosphatemia -Replaced adequately.  Normal levels this morning.   Recent Labs  Lab 11/17/20 1229 11/18/20 0114 11/19/20 0056 11/20/20 0106 11/21/20 0149  K 3.5 3.8 4.2 4.0 4.5  MG  --  1.2* 2.2  --   --   PHOS  --  2.4* 2.0* 2.6  --    Type 2 diabetes mellitus -A1c 5.8 on 10/07/20 -Currently blood sugar level controlled on sliding scale insulin. -On metformin 500 mg twice daily at home.  Resume the same post discharge. Recent Labs  Lab 11/20/20 0755 11/20/20 1113 11/20/20 1655 11/20/20 2024 11/21/20 0801  GLUCAP 106* 110* 104* 155* 112*   Rheumatoid Arthritis -At home, on etanercept weekly.  Methotrexate recently stopped.  Anxiety/Depression/Fibromyalgia -At home on Bupropion 150 mg daily, Cymbalta 90 mg daily, Neurontin 600 mg 3 times daily, as needed tramadol. -Resumed.  Peripheral edema  -On lasix PRN   Asthma  -On Singulair and as needed albuterol  GERD Prilosec 20 mg daily  Morbid obesity  -Body mass index is 43.5 kg/m. Patient has been advised to make an attempt to improve diet and exercise patterns to aid in weight loss.  Stable for discharge to home today.   Wound care: Incision (Closed) 11/17/20  Vagina Other (Comment) (Active)  Date First Assessed/Time First Assessed: 11/17/20 1946   Location: Vagina  Location Orientation: Other (Comment)    Assessments 11/17/2020  8:05 PM 11/19/2020  8:00 AM  Dressing Clean;Dry;Intact Intact;Clean;Dry  Site / Wound Assessment Clean;Dry Clean;Dry  Drainage Amount None --     No Linked orders to display    Discharge Exam:   Vitals:    11/20/20 0948 11/20/20 1438 11/20/20 2019 11/21/20 0900  BP: (!) 167/85 (!) 157/89 (!) 120/57 132/69  Pulse: 75 86 77 83  Resp: 18 18 18 17   Temp: 97.8 F (36.6 C) 98.3 F (36.8 C) 97.6 F (36.4 C) 98.4 F (36.9 C)  TempSrc: Oral Axillary    SpO2: 100% 95% 99% 99%  Weight:      Height:        Body mass index is 43.5 kg/m.  General exam: Pleasant, morbidly obese, middle-aged Caucasian female.  Not in distress Skin: No rashes, lesions or ulcers. HEENT: Atraumatic, normocephalic, no obvious bleeding Lungs: Clear to auscultation bilaterally CVS: Regular rate and rhythm, no murmur GI/Abd soft, nontender, nondistended, bowel sound present CNS: Alert, awake, oriented x3 Psychiatry: Mood appropriate Extremities: No pedal edema, no calf tenderness  Follow ups:   Discharge Instructions    Increase activity slowly   Complete by: As directed    No wound care   Complete by: As directed       Follow-up Information    Raina Mina., MD Follow up.   Specialty: Internal Medicine Contact information: [place] [address] [phone]               Recommendations for Outpatient Follow-Up:   1. Follow-up with PCP as an outpatient 2. Follow-up with urologist as an outpatient  Discharge Instructions:  Follow with Primary MD Raina Mina., MD in 7 days   Get CBC/BMP checked in next visit within 1 week by PCP or SNF MD ( we routinely change or add medications that can affect your baseline labs and fluid status, therefore we recommend that you get the mentioned basic workup next visit with your PCP, your PCP may decide not to get them or add new tests based on their clinical decision)  On your next visit with your PCP, please Get Medicines reviewed and adjusted.  Please request your PCP  to go over all Hospital Tests and Procedure/Radiological results at the follow up, please get all Hospital records sent to your Prim MD by signing hospital release before  you go home.  Activity: As tolerated with Full fall precautions use walker/cane & assistance as needed  For Heart failure patients - Check your Weight same time everyday, if you gain over 2 pounds, or you develop in leg swelling, experience more shortness of breath or chest pain, call your Primary MD immediately. Follow Cardiac Low Salt Diet and 1.5 lit/day fluid restriction.  If you have smoked or chewed Tobacco in the last 2 yrs please stop smoking, stop any regular Alcohol  and or any Recreational drug use.  If you experience worsening of your admission symptoms, develop shortness of breath, life threatening emergency, suicidal or homicidal thoughts you must seek medical attention immediately by calling 911 or calling your MD immediately  if symptoms less severe.  You Must read complete instructions/literature along with all the possible adverse reactions/side effects for all the Medicines you take and that have been prescribed to you. Take any new Medicines after you have completely understood and  accpet all the possible adverse reactions/side effects.   Do not drive, operate heavy machinery, perform activities at heights, swimming or participation in water activities or provide baby sitting services if your were admitted for syncope or siezures until you have seen by Primary MD or a Neurologist and advised to do so again.  Do not drive when taking Pain medications.  Do not take more than prescribed Pain, Sleep and Anxiety Medications  Wear Seat belts while driving.   Please note You were cared for by a hospitalist during your hospital stay. If you have any questions about your discharge medications or the care you received while you were in the hospital after you are discharged, you can call the unit and asked to speak with the hospitalist on call if the hospitalist that took care of you is not available. Once you are discharged, your primary care physician will handle any further medical  issues. Please note that NO REFILLS for any discharge medications will be authorized once you are discharged, as it is imperative that you return to your primary care physician (or establish a relationship with a primary care physician if you do not have one) for your aftercare needs so that they can reassess your need for medications and monitor your lab values.    Allergies as of 11/21/2020   No Known Allergies     Medication List    STOP taking these medications   meloxicam 15 MG tablet Commonly known as: MOBIC     TAKE these medications   albuterol 108 (90 Base) MCG/ACT inhaler Commonly known as: VENTOLIN HFA Inhale 2 puffs into the lungs every 6 (six) hours as needed. FOR WHEEZING   blood glucose meter kit and supplies Dispense based on patient and insurance preference. Use up to four times daily as directed. (FOR ICD-10 E10.9, E11.9). Test twice daily   blood glucose meter kit and supplies Kit Dispense based on patient and insurance preference. Use up to four times daily as directed.   buPROPion 150 MG 12 hr tablet Commonly known as: Wellbutrin SR Take 1 tablet (150 mg total) by mouth 2 (two) times daily. What changed: when to take this   cephALEXin 500 MG capsule Commonly known as: KEFLEX Take 1 capsule (500 mg total) by mouth 3 (three) times daily for 10 days.   cyclobenzaprine 10 MG tablet Commonly known as: FLEXERIL Take 10 mg by mouth 3 (three) times daily as needed for muscle spasms.   DULoxetine 30 MG capsule Commonly known as: CYMBALTA Take 90 mg by mouth daily.   ENBREL [place] Inject into the skin. Inject dose once per week (on Saturdays)   folic acid 1 MG tablet Commonly known as: FOLVITE Take 1 mg by mouth daily.   furosemide 20 MG tablet Commonly known as: LASIX Take 20 mg by mouth daily as needed.   gabapentin 300 MG capsule Commonly known as: NEURONTIN Take 600 mg by mouth 3 (three) times daily. 2 capsules in the AM, 1 capsule at lunch as  needed, 2 capsules at bedtime   HAIR SKIN & NAILS ADVANCED PO Take 2 tablets by mouth 2 (two) times daily.   metFORMIN 500 MG 24 hr tablet Commonly known as: GLUCOPHAGE-XR TAKE 1 TABLET BY MOUTH  TWICE DAILY   methotrexate 2.5 MG tablet Commonly known as: RHEUMATREX Take 2.5 mg by mouth once a week. Take 8 tablets by mouth once a week on Friday.   montelukast 10 MG tablet Commonly known as: SINGULAIR Take  10 mg by mouth at bedtime.   mupirocin ointment 2 % Commonly known as: BACTROBAN Place 1 application into the nose 2 (two) times daily as needed.   omeprazole 20 MG capsule Commonly known as: PRILOSEC Take 1 capsule (20 mg total) by mouth daily.   predniSONE 20 MG tablet Commonly known as: DELTASONE Take 20 mg by mouth daily.   saccharomyces boulardii 250 MG capsule Commonly known as: FLORASTOR Take 1 capsule (250 mg total) by mouth 2 (two) times daily for 14 days.   traMADol 50 MG tablet Commonly known as: Ultram Take 1 tablet (50 mg total) by mouth every 6 (six) hours as needed for up to 5 days. What changed:   when to take this  reasons to take this   Trulicity 1.5 JK/9.3OI Sopn Generic drug: Dulaglutide Inject 0.5 mLs (1.5 mg total) into the skin once a week.   Vitamin D (Ergocalciferol) 1.25 MG (50000 UNIT) Caps capsule Commonly known as: DRISDOL Take 1 capsule (50,000 Units total) by mouth every 7 (seven) days.       Time coordinating discharge: 35 minutes  The results of significant diagnostics from this hospitalization (including imaging, microbiology, ancillary and laboratory) are listed below for reference.    Procedures and Diagnostic Studies:   CT Abdomen Pelvis W Contrast  Result Date: 11/17/2020 CLINICAL DATA:  Generalized abdominal pain, fever. EXAM: CT ABDOMEN AND PELVIS WITH CONTRAST TECHNIQUE: Multidetector CT imaging of the abdomen and pelvis was performed using the standard protocol following bolus administration of intravenous  contrast. CONTRAST:  140m OMNIPAQUE IOHEXOL 300 MG/ML  SOLN COMPARISON:  August 09, 2011. FINDINGS: Lower chest: No acute abnormality. Hepatobiliary: Left hepatic cyst is noted. No gallstones, gallbladder wall thickening, or biliary dilatation. Pancreas: Unremarkable. No pancreatic ductal dilatation or surrounding inflammatory changes. Spleen: Normal in size without focal abnormality. Adrenals/Urinary Tract: Adrenal glands appear normal. Right kidney and ureter are unremarkable. Urinary bladder appears normal. Moderate left hydronephrosis is noted with perinephric stranding secondary to 6 mm proximal left ureteral calculus. Stomach/Bowel: The stomach appears normal. There is no evidence of bowel obstruction or inflammation. Vascular/Lymphatic: No significant vascular findings are present. No enlarged abdominal or pelvic lymph nodes. Reproductive: Uterus and bilateral adnexa are unremarkable. Other: No abdominal wall hernia or abnormality. No abdominopelvic ascites. Musculoskeletal: No acute or significant osseous findings. IMPRESSION: Moderate left hydronephrosis is noted with perinephric stranding secondary to 6 mm proximal left ureteral calculus. Electronically Signed   By: JMarijo ConceptionM.D.   On: 11/17/2020 15:56   DG Chest Port 1 View  Result Date: 11/17/2020 CLINICAL DATA:  Possible sepsis. EXAM: PORTABLE CHEST 1 VIEW COMPARISON:  Chest x-ray dated June 26, 2016. FINDINGS: The heart size and mediastinal contours are within normal limits. Normal pulmonary vascularity. Small spiculated density in the region of the lingula. Minimal right basilar atelectasis. No focal consolidation, pleural effusion, or pneumothorax. No acute osseous abnormality. IMPRESSION: 1. Small spiculated density in the region of the lingula, not seen on prior studies. Recommend CT chest for further evaluation. Electronically Signed   By: WTitus DubinM.D.   On: 11/17/2020 13:38   DG C-Arm 1-60 Min-No Report  Result Date:  11/17/2020 Fluoroscopy was utilized by the requesting physician.  No radiographic interpretation.     Labs:   Basic Metabolic Panel: Recent Labs  Lab 11/17/20 1229 11/18/20 0114 11/19/20 0056 11/20/20 0106 11/21/20 0149  NA 131* 133* 136 136 138  K 3.5 3.8 4.2 4.0 4.5  CL 100 101 105  102 104  CO2 25 21* 25 27 28   GLUCOSE 112* 83 109* 112* 118*  BUN 8 12 11  6* 10  CREATININE 0.97 1.60* 0.95 0.69 0.79  CALCIUM 8.3* 7.7* 7.8* 8.3* 8.6*  MG  --  1.2* 2.2  --   --   PHOS  --  2.4* 2.0* 2.6  --    GFR Estimated Creatinine Clearance: 98.5 mL/min (by C-G formula based on SCr of 0.79 mg/dL). Liver Function Tests: Recent Labs  Lab 11/17/20 1229  AST 18  ALT 19  ALKPHOS 68  BILITOT 0.9  PROT 6.7  ALBUMIN 3.4*   Recent Labs  Lab 11/17/20 1229  LIPASE 36   No results for input(s): AMMONIA in the last 168 hours. Coagulation profile Recent Labs  Lab 11/17/20 1348  INR 1.1    CBC: Recent Labs  Lab 11/17/20 1229 11/18/20 0114 11/19/20 0056 11/20/20 0106 11/21/20 0149  WBC 16.5* 21.5* 24.9* 24.6* 11.4*  NEUTROABS 15.2*  --   --   --   --   HGB 11.3* 9.3* 9.6* 11.1* 10.0*  HCT 35.5* 29.6* 30.2* 34.3* 30.4*  MCV 93.7 94.9 94.7 91.7 90.7  PLT 304 217 180 181 212   Cardiac Enzymes: No results for input(s): CKTOTAL, CKMB, CKMBINDEX, TROPONINI in the last 168 hours. BNP: Invalid input(s): POCBNP CBG: Recent Labs  Lab 11/20/20 0755 11/20/20 1113 11/20/20 1655 11/20/20 2024 11/21/20 0801  GLUCAP 106* 110* 104* 155* 112*   D-Dimer No results for input(s): DDIMER in the last 72 hours. Hgb A1c No results for input(s): HGBA1C in the last 72 hours. Lipid Profile No results for input(s): CHOL, HDL, LDLCALC, TRIG, CHOLHDL, LDLDIRECT in the last 72 hours. Thyroid function studies No results for input(s): TSH, T4TOTAL, T3FREE, THYROIDAB in the last 72 hours.  Invalid input(s): FREET3 Anemia work up No results for input(s): VITAMINB12, FOLATE, FERRITIN, TIBC,  IRON, RETICCTPCT in the last 72 hours. Microbiology Recent Results (from the past 240 hour(s))  MRSA PCR Screening     Status: None   Collection Time: 11/17/20  3:20 AM   Specimen: Nasal Mucosa; Nasopharyngeal  Result Value Ref Range Status   MRSA by PCR NEGATIVE NEGATIVE Final    Comment:        The GeneXpert MRSA Assay (FDA approved for NASAL specimens only), is one component of a comprehensive MRSA colonization surveillance program. It is not intended to diagnose MRSA infection nor to guide or monitor treatment for MRSA infections. Performed at [facility], [place] [address], [place], [place] [address]   Blood Culture (routine x 2)     Status: Abnormal   Collection Time: 11/17/20  1:00 PM   Specimen: BLOOD  Result Value Ref Range Status   Specimen Description BLOOD RIGHT ANTECUBITAL  Final   Special Requests   Final    BOTTLES DRAWN AEROBIC AND ANAEROBIC Blood Culture results may not be optimal due to an excessive volume of blood received in culture bottles   Culture  Setup Time   Final    IN BOTH AEROBIC AND ANAEROBIC BOTTLES GRAM NEGATIVE RODS CRITICAL RESULT CALLED TO, READ BACK BY AND VERIFIED WITH: PHARMD P. [phone] FCP Performed at [facility], [place] [address] [place], [place] [address]    Culture ESCHERICHIA COLI (A)  Final   Report Status 11/20/2020 FINAL  Final   Organism ID, Bacteria ESCHERICHIA COLI  Final      Susceptibility   Escherichia coli - MIC*  AMPICILLIN >=32 RESISTANT Resistant     CEFAZOLIN <=4 SENSITIVE Sensitive     CEFEPIME <=0.12 SENSITIVE Sensitive     CEFTAZIDIME <=1 SENSITIVE Sensitive     CEFTRIAXONE <=0.25 SENSITIVE Sensitive     CIPROFLOXACIN <=0.25 SENSITIVE Sensitive     GENTAMICIN <=1 SENSITIVE Sensitive     IMIPENEM <=0.25 SENSITIVE Sensitive     TRIMETH/SULFA <=20 SENSITIVE Sensitive     AMPICILLIN/SULBACTAM >=32 RESISTANT Resistant     PIP/TAZO <=4 SENSITIVE Sensitive     * ESCHERICHIA COLI  Blood Culture ID  Panel (Reflexed)     Status: Abnormal   Collection Time: 11/17/20  1:00 PM  Result Value Ref Range Status   Enterococcus faecalis NOT DETECTED NOT DETECTED Final   Enterococcus Faecium NOT DETECTED NOT DETECTED Final   Listeria monocytogenes NOT DETECTED NOT DETECTED Final   Staphylococcus species NOT DETECTED NOT DETECTED Final   Staphylococcus aureus (BCID) NOT DETECTED NOT DETECTED Final   Staphylococcus epidermidis NOT DETECTED NOT DETECTED Final   Staphylococcus lugdunensis NOT DETECTED NOT DETECTED Final   Streptococcus species NOT DETECTED NOT DETECTED Final   Streptococcus agalactiae NOT DETECTED NOT DETECTED Final   Streptococcus pneumoniae NOT DETECTED NOT DETECTED Final   Streptococcus pyogenes NOT DETECTED NOT DETECTED Final   A.calcoaceticus-baumannii NOT DETECTED NOT DETECTED Final   Bacteroides fragilis NOT DETECTED NOT DETECTED Final   Enterobacterales DETECTED (A) NOT DETECTED Final    Comment: Enterobacterales represent a large order of gram negative bacteria, not a single organism. CRITICAL RESULT CALLED TO, READ BACK BY AND VERIFIED WITH: PHARMD P. [phone] FCP    Enterobacter cloacae complex NOT DETECTED NOT DETECTED Final   Escherichia coli DETECTED (A) NOT DETECTED Final    Comment: CRITICAL RESULT CALLED TO, READ BACK BY AND VERIFIED WITH: PHARMD P. [phone] FCP    Klebsiella aerogenes NOT DETECTED NOT DETECTED Final   Klebsiella oxytoca NOT DETECTED NOT DETECTED Final   Klebsiella pneumoniae NOT DETECTED NOT DETECTED Final   Proteus species NOT DETECTED NOT DETECTED Final   Salmonella species NOT DETECTED NOT DETECTED Final   Serratia marcescens NOT DETECTED NOT DETECTED Final   Haemophilus influenzae NOT DETECTED NOT DETECTED Final   Neisseria meningitidis NOT DETECTED NOT DETECTED Final   Pseudomonas aeruginosa NOT DETECTED NOT DETECTED Final   Stenotrophomonas maltophilia NOT DETECTED NOT DETECTED Final   Candida albicans NOT DETECTED NOT DETECTED  Final   Candida auris NOT DETECTED NOT DETECTED Final   Candida glabrata NOT DETECTED NOT DETECTED Final   Candida krusei NOT DETECTED NOT DETECTED Final   Candida parapsilosis NOT DETECTED NOT DETECTED Final   Candida tropicalis NOT DETECTED NOT DETECTED Final   Cryptococcus neoformans/gattii NOT DETECTED NOT DETECTED Final   CTX-M ESBL NOT DETECTED NOT DETECTED Final   Carbapenem resistance IMP NOT DETECTED NOT DETECTED Final   Carbapenem resistance KPC NOT DETECTED NOT DETECTED Final   Carbapenem resistance NDM NOT DETECTED NOT DETECTED Final   Carbapenem resist OXA 48 LIKE NOT DETECTED NOT DETECTED Final   Carbapenem resistance VIM NOT DETECTED NOT DETECTED Final    Comment: Performed at [facility], [address] [place], [place] [address]  Resp Panel by RT-PCR (Flu A&B, Covid) Nasopharyngeal Swab     Status: None   Collection Time: 11/17/20  1:56 PM   Specimen: Nasopharyngeal Swab; Nasopharyngeal(NP) swabs in vial transport medium  Result Value Ref Range Status   SARS Coronavirus 2 by RT PCR NEGATIVE NEGATIVE Final  Comment: (NOTE) SARS-CoV-2 target nucleic acids are NOT DETECTED.  The SARS-CoV-2 RNA is generally detectable in upper respiratory specimens during the acute phase of infection. The lowest concentration of SARS-CoV-2 viral copies this assay can detect is 138 copies/mL. A negative result does not preclude SARS-Cov-2 infection and should not be used as the sole basis for treatment or other patient management decisions. A negative result may occur with  improper specimen collection/handling, submission of specimen other than nasopharyngeal swab, presence of viral mutation(s) within the areas targeted by this assay, and inadequate number of viral copies(<138 copies/mL). A negative result must be combined with clinical observations, patient history, and epidemiological information. The expected result is Negative.  Fact Sheet for Patients:   EntrepreneurPulse.com.au  Fact Sheet for Healthcare Providers:  IncredibleEmployment.be  This test is no t yet approved or cleared by the [place] FDA and  has been authorized for detection and/or diagnosis of SARS-CoV-2 by FDA under an Emergency Use Authorization (EUA). This EUA will remain  in effect (meaning this test can be used) for the duration of the COVID-19 declaration under Section 564(b)(1) of the Act, 21 U.S.C.section 360bbb-3(b)(1), unless the authorization is terminated  or revoked sooner.       Influenza A by PCR NEGATIVE NEGATIVE Final   Influenza B by PCR NEGATIVE NEGATIVE Final    Comment: (NOTE) The Xpert Xpress SARS-CoV-2/FLU/RSV plus assay is intended as an aid in the diagnosis of influenza from Nasopharyngeal swab specimens and should not be used as a sole basis for treatment. Nasal washings and aspirates are unacceptable for Xpert Xpress SARS-CoV-2/FLU/RSV testing.  Fact Sheet for Patients: EntrepreneurPulse.com.au  Fact Sheet for Healthcare Providers: IncredibleEmployment.be  This test is not yet approved or cleared by the [place] FDA and has been authorized for detection and/or diagnosis of SARS-CoV-2 by FDA under an Emergency Use Authorization (EUA). This EUA will remain in effect (meaning this test can be used) for the duration of the COVID-19 declaration under Section 564(b)(1) of the Act, 21 U.S.C. section 360bbb-3(b)(1), unless the authorization is terminated or revoked.  Performed at [facility], [place] [address], [place], [place] [address]   Urine culture     Status: Abnormal   Collection Time: 11/17/20  4:44 PM   Specimen: In/Out Cath Urine  Result Value Ref Range Status   Specimen Description IN/OUT CATH URINE  Final   Special Requests   Final    NONE Performed at [facility], [place] [address], [place], [place] [address]    Culture >=100,000  COLONIES/mL ESCHERICHIA COLI (A)  Final   Report Status 11/20/2020 FINAL  Final   Organism ID, Bacteria ESCHERICHIA COLI (A)  Final      Susceptibility   Escherichia coli - MIC*    AMPICILLIN >=32 RESISTANT Resistant     CEFAZOLIN <=4 SENSITIVE Sensitive     CEFEPIME <=0.12 SENSITIVE Sensitive     CEFTRIAXONE <=0.25 SENSITIVE Sensitive     CIPROFLOXACIN <=0.25 SENSITIVE Sensitive     GENTAMICIN <=1 SENSITIVE Sensitive     IMIPENEM <=0.25 SENSITIVE Sensitive     NITROFURANTOIN <=16 SENSITIVE Sensitive     TRIMETH/SULFA <=20 SENSITIVE Sensitive     AMPICILLIN/SULBACTAM >=32 RESISTANT Resistant     PIP/TAZO <=4 SENSITIVE Sensitive     * >=100,000 COLONIES/mL ESCHERICHIA COLI  Blood Culture (routine x 2)     Status: None (Preliminary result)   Collection Time: 11/17/20  9:54 PM   Specimen: BLOOD RIGHT HAND  Result Value Ref Range Status   Specimen Description BLOOD RIGHT HAND  Final   Special Requests AEROBIC BOTTLE ONLY Blood Culture adequate volume  Final   Culture   Final    NO GROWTH 4 DAYS Performed at [facility], [address], [place], [place] [address]    Report Status PENDING  Incomplete  Culture, blood (routine x 2)     Status: None (Preliminary result)   Collection Time: 11/20/20 10:53 AM   Specimen: BLOOD  Result Value Ref Range Status   Specimen Description BLOOD LEFT ANTECUBITAL  Final   Special Requests   Final    BOTTLES DRAWN AEROBIC ONLY Blood Culture adequate volume   Culture   Final    NO GROWTH < 24 HOURS Performed at [facility], [place] [address], [place], [place] [address]    Report Status PENDING  Incomplete  Culture, blood (routine x 2)     Status: None (Preliminary result)   Collection Time: 11/20/20 10:53 AM   Specimen: BLOOD  Result Value Ref Range Status   Specimen Description BLOOD RIGHT ANTECUBITAL  Final   Special Requests   Final    BOTTLES DRAWN AEROBIC ONLY Blood Culture adequate volume   Culture   Final    NO GROWTH < 24  HOURS Performed at [facility], [place] [address], [place], [place] [address]    Report Status PENDING  Incomplete     Signed: Marlowe Aschoff Sally [name]  [facility] 11/21/2020, 11:30 AM

## 2020-11-21 NOTE — Discharge Instructions (Signed)
Sepsis, Self Care, Adult °Sepsis is a serious illness that may require intensive care in the hospital. The following information explains what you need to know in order to manage your condition after you are discharged from the hospital. °What are the risks? °After being treated for sepsis and discharged from the hospital, you may be at a higher risk for certain problems. These problems may be physical or mental. °Physical problems: °· Weakness and tiredness. °· Shortness of breath. °· Pain in many areas of the body. °· Difficulty walking. °· Dry, itchy skin. °· Lack of appetite. This may lead to weight loss. °· Organ failure. °Mental problems: °· Difficulty sleeping. °· Depression. °· Confusion. °· Anxiety and worry caused by having gone through a bad experience (post-traumatic stress disorder,PTSD). °· Low self-esteem. °Follow these instructions at home: °Medicines °· Take over-the-counter and prescription medicines only as told by your health care provider. °· If you were prescribed an antibiotic, antiviral, or antifungal medicine, take it as told by your health care provider. Do not stop taking the medicine even if you start to feel better. °Eating and drinking °· Eat a healthy diet that includes plenty of vegetables, fruits, whole grains, low-fat dairy products, and lean protein. Ask your health care provider if you should avoid certain foods. °· Drink enough fluid to keep your urine pale yellow.   °Alcohol use °· Do not drink alcohol if: °? Your health care provider tells you not to drink. °? You are pregnant, may be pregnant, or are planning to become pregnant. °· If you drink alcohol, limit how much you use to: °? 0-1 drink a day for women. °? 0-2 drinks a day for men. °§ Be aware of how much alcohol is in your drink. In the [place] one drink equals one 12 oz bottle of beer (355 mL), one 5 oz glass of wine (148 mL), or one 1½ oz glass of hard liquor (44 mL). °Activity °· Rest and gradually return to your  normal activities. Ask your health care provider what activities are safe for you. °· Avoid sitting for a long time without moving. Get up to take short walks every 1-2 hours. This is important to improve blood flow and breathing. Ask for help if you feel weak or unsteady. °· Try to set small, achievable goals each week, such as dressing yourself, bathing, or walking up the stairs. It may take a while to rebuild your strength. °· Try to exercise regularly if you feel healthy enough to do so. Ask your health care provider what exercises are safe for you. °Preventing infection °· Keep your vaccinations up to date. Get the flu shot every year. °· Wash your hands often using soap and water. Use hand sanitizer if soap and water are not available. °· Practice good hygiene. Keep cuts clean and covered until healed.   °Managing stress °Talk with your health care provider or counselor about ways to reduce stress. He or she may suggest: °· Meditation, muscle relaxation, and breathing exercises. °· Talk therapy. °· Spending time on hobbies and activities that you enjoy. °General instructions °· Get the right amount and quality of sleep. Most adults need 7-9 hours of sleep each night. To help with sleep: °? Keep your bedroom cool and dark. °? Do not eat a heavy meal within one hour of bedtime. °? Do not drink alcohol or caffeinated drinks before bed. °? Avoid screen time, such as television, computers, tablets, or cell phones before bed. °· Do not use   any products that contain nicotine or tobacco, such as cigarettes, e-cigarettes, and chewing tobacco. If you need help quitting, ask your health care provider. °· Talk to trusted family members and friends about your condition. Explain your symptoms to them, and let them know that you are working with a health care provider to treat your condition. This can provide you with one way to get support and guidance. °· Keep all follow-up visits as told by your health care provider. This  is important. °Questions to ask your health care provider: °· What physical and emotional changes do I need to report? °· Do I need to have someone with me all the time? °· Is it safe for me to drive? °Contact a health care provider if you: °· Do not feel like you are getting better or regaining strength. °· Have muscle or joint pain. °· Frequently feel tired. °· Are having trouble coping with your recovery. °· Have nightmares, or trouble falling asleep or staying asleep. °· Feel sad, down, or depressed more often than not, every day for more than 2 weeks. °· Have difficulty concentrating. °· Feel irritable or you cry for no reason. °Get help right away if you: °· Have difficulty breathing. °· Have a rapid or skipping heartbeat. °· Become confused or disoriented. °· See, hear, or feel things that do not exist (hallucinations). °· Have a high fever. °· Have an infection that is getting worse or not getting better. °· You have thoughts of hurting yourself or others. °If you ever feel like you may hurt yourself or others, or have thoughts about taking your own life, get help right away. You can go to your nearest emergency department or call: °· Your local emergency services (911 in the [place] °· A suicide crisis helpline, such as the National Suicide Prevention Lifeline at 1-800-273-8255. This is open 24 hours a day. °Summary °· Sepsis is a serious illness that may require intensive care in a hospital. You may experience long-term health effects after you are discharged from the hospital. °· Try to set small, achievable goals each week, such as dressing yourself, bathing, or walking up the stairs. It may take a while to rebuild your strength. °· Keep all follow-up visits as told by your health care provider. This is important. °· Know what symptoms you should get help right away for. °This information is not intended to replace advice given to you by your health care provider. Make sure you discuss any questions you  have with your health care provider. °Document Revised: 02/22/2018 Document Reviewed: 02/22/2018 °Elsevier Patient Education © 2021 Elsevier Inc. ° °

## 2020-11-21 NOTE — Plan of Care (Signed)
  Problem: Clinical Measurements: Goal: Ability to maintain clinical measurements within normal limits will improve Outcome: Progressing Goal: Will remain free from infection Outcome: Progressing Goal: Diagnostic test results will improve Outcome: Progressing Goal: Respiratory complications will improve Outcome: Progressing Goal: Cardiovascular complication will be avoided Outcome: Progressing   Problem: Education: Goal: Required Educational Video(s) Outcome: Progressing   Problem: Clinical Measurements: Goal: Postoperative complications will be avoided or minimized Outcome: Progressing   Problem: Skin Integrity: Goal: Demonstration of wound healing without infection will improve Outcome: Progressing

## 2020-11-22 LAB — CULTURE, BLOOD (ROUTINE X 2)
Culture: NO GROWTH
Special Requests: ADEQUATE

## 2020-11-24 ENCOUNTER — Ambulatory Visit (INDEPENDENT_AMBULATORY_CARE_PROVIDER_SITE_OTHER): Payer: Medicare Other | Admitting: Family Medicine

## 2020-11-25 LAB — CULTURE, BLOOD (ROUTINE X 2)
Culture: NO GROWTH
Culture: NO GROWTH
Special Requests: ADEQUATE
Special Requests: ADEQUATE

## 2020-11-30 ENCOUNTER — Ambulatory Visit (INDEPENDENT_AMBULATORY_CARE_PROVIDER_SITE_OTHER): Payer: Medicare Other | Admitting: Family Medicine

## 2020-11-30 ENCOUNTER — Encounter (INDEPENDENT_AMBULATORY_CARE_PROVIDER_SITE_OTHER): Payer: Self-pay | Admitting: Family Medicine

## 2020-11-30 ENCOUNTER — Other Ambulatory Visit: Payer: Self-pay

## 2020-11-30 VITALS — BP 102/69 | HR 85 | Temp 98.1°F | Ht 63.0 in | Wt 249.0 lb

## 2020-11-30 DIAGNOSIS — E1169 Type 2 diabetes mellitus with other specified complication: Secondary | ICD-10-CM | POA: Diagnosis not present

## 2020-11-30 DIAGNOSIS — F3289 Other specified depressive episodes: Secondary | ICD-10-CM | POA: Diagnosis not present

## 2020-11-30 DIAGNOSIS — E559 Vitamin D deficiency, unspecified: Secondary | ICD-10-CM | POA: Diagnosis not present

## 2020-11-30 DIAGNOSIS — Z6841 Body Mass Index (BMI) 40.0 and over, adult: Secondary | ICD-10-CM

## 2020-11-30 MED ORDER — BUPROPION HCL ER (SR) 150 MG PO TB12: 150.0000 mg | ORAL_TABLET | Freq: Every day | ORAL | 0 refills | Status: DC

## 2020-11-30 MED ORDER — VITAMIN D (ERGOCALCIFEROL) 1.25 MG (50000 UNIT) PO CAPS: 50000.0000 [IU] | ORAL_CAPSULE | ORAL | 0 refills | Status: DC

## 2020-12-01 ENCOUNTER — Encounter (INDEPENDENT_AMBULATORY_CARE_PROVIDER_SITE_OTHER): Payer: Self-pay | Admitting: Family Medicine

## 2020-12-01 NOTE — Progress Notes (Signed)
Chief Complaint:   OBESITY Sally [name] is here to discuss her progress with her obesity treatment plan along with follow-up of her obesity related diagnoses. Sally [name] is on the Category 3 Plan and states she is following her eating plan approximately 20% of the time. [name] states she is not currently exercising.  Today's visit was #: 45 Starting weight: 281 lbs Starting date: 05/14/2018 Today's weight: 249 lbs Today's date: 11/30/2020 Total lbs lost to date: 32 lbs Total lbs lost since last in-office visit: 7  Interim History: Sally [name] was recently hospitalized for 4 days for sepsis due to large, obstructive kidney stone. She sees urology today for a follow up. She saw bariatric surgeon recently for a consultation. She wants to put weight loss surgery on hold until after stone is taken care of.  Subjective:   1. Other depression, with emotional eating [name]'s mood is stable overall. She admits to being somewhat depressed recently due to hospitalization.  2. Vitamin D deficiency Sally [name]'s Vit D is nearly at goal of 50 (49). She is on weekly prescription Vit D.  3. Type 2 diabetes mellitus with other specified complication, without long-term current use of insulin (HCC) Sally [name] is off Trulicity. She working on Hospital doctor for [employer]. DM is well controlled. Her last A1c was 5.7. She is on Metformin BID.  Lab Results  Component Value Date   HGBA1C 5.8 (H) 10/07/2020   HGBA1C 5.8 (H) 01/12/2020   HGBA1C 6.4 (H) 09/02/2019   Lab Results  Component Value Date   LDLCALC 83 09/02/2019   CREATININE 0.79 11/21/2020   Lab Results  Component Value Date   INSULIN 7.5 09/02/2019   INSULIN 4.2 04/17/2019   INSULIN 6.6 10/08/2018   INSULIN 9.8 05/14/2018    Assessment/Plan:   1. Other depression, with emotional eating Refill:  - buPROPion (WELLBUTRIN SR) 150 MG 12 hr tablet; Take 1 tablet (150 mg total) by mouth daily.  Dispense: 90 tablet; Refill: 0  2. Vitamin D  deficiency She agrees to continue to take prescription Vitamin D @50 ,000 IU every week and will follow-up for routine testing of Vitamin D, at least 2-3 times per year to avoid over-replacement.  - Vitamin D, Ergocalciferol, (DRISDOL) 1.25 MG (50000 UNIT) CAPS capsule; Take 1 capsule (50,000 Units total) by mouth every 7 (seven) days.  Dispense: 4 capsule; Refill: 0  3. Type 2 diabetes mellitus with other specified complication, without long-term current use of insulin (HCC)  I will send Rx for Ozempic once she lets me where to send it since she is in process for PAP.  4. Obesity: Current BMI 44.12  Sally [name] is currently in the action stage of change. As such, her goal is to continue with weight loss efforts. She has agreed to the Category 3 Plan.   Exercise goals: No exercise has been prescribed at this time.  Behavioral modification strategies: increasing lean protein intake and decreasing simple carbohydrates.  Sally [name] has agreed to follow-up with our clinic in 3 weeks.   Objective:   Blood pressure 102/69, pulse 85, temperature 98.1 F (36.7 C), height 5\' 3"  (1.6 m), weight 249 lb (112.9 kg), last menstrual period 05/31/2012, SpO2 97 %. Body mass index is 44.11 kg/m.  General: Cooperative, alert, well developed, in no acute distress. HEENT: Conjunctivae and lids unremarkable. Cardiovascular: Regular rhythm.  Lungs: Normal work of breathing. Neurologic: No focal deficits.   Lab Results  Component Value Date   CREATININE 0.79 11/21/2020   BUN 10 11/21/2020  NA 138 11/21/2020   K 4.5 11/21/2020   CL 104 11/21/2020   CO2 28 11/21/2020   Lab Results  Component Value Date   ALT 19 11/17/2020   AST 18 11/17/2020   ALKPHOS 68 11/17/2020   BILITOT 0.9 11/17/2020   Lab Results  Component Value Date   HGBA1C 5.8 (H) 10/07/2020   HGBA1C 5.8 (H) 01/12/2020   HGBA1C 6.4 (H) 09/02/2019   HGBA1C 6.0 (H) 04/17/2019   HGBA1C 6.6 (H) 10/08/2018   Lab Results  Component Value  Date   INSULIN 7.5 09/02/2019   INSULIN 4.2 04/17/2019   INSULIN 6.6 10/08/2018   INSULIN 9.8 05/14/2018   Lab Results  Component Value Date   TSH 3.190 09/02/2019   Lab Results  Component Value Date   CHOL 163 09/02/2019   HDL 55 09/02/2019   LDLCALC 83 09/02/2019   TRIG 147 09/02/2019   Lab Results  Component Value Date   WBC 11.4 (H) 11/21/2020   HGB 10.0 (L) 11/21/2020   HCT 30.4 (L) 11/21/2020   MCV 90.7 11/21/2020   PLT 212 11/21/2020   No results found for: IRON, TIBC, FERRITIN  Obesity Behavioral Intervention:   Approximately 15 minutes were spent on the discussion below.  ASK: We discussed the diagnosis of obesity with Sally [name] today and Sally [name] agreed to give Sally [name] permission to discuss obesity behavioral modification therapy today.  ASSESS: Sally [name] has the diagnosis of obesity and her BMI today is 44.2. Sally [name] is in the action stage of change.   ADVISE: Sally [name] was educated on the multiple health risks of obesity as well as the benefit of weight loss to improve her health. She was advised of the need for long term treatment and the importance of lifestyle modifications to improve her current health and to decrease her risk of future health problems.  AGREE: Multiple dietary modification options and treatment options were discussed and Sally [name] agreed to follow the recommendations documented in the above note.  ARRANGE: Sally [name] was educated on the importance of frequent visits to treat obesity as outlined per CMS and USPSTF guidelines and agreed to schedule her next follow up appointment today.  Attestation Statements:   Reviewed by clinician on day of visit: allergies, medications, problem list, medical history, surgical history, family history, social history, and previous encounter notes.  Sally [name], am acting as Energy manager for [place], FNP.  I have reviewed the above documentation for accuracy and completeness, and I agree with the above. -   Sally Sans, FNP

## 2020-12-02 ENCOUNTER — Other Ambulatory Visit (INDEPENDENT_AMBULATORY_CARE_PROVIDER_SITE_OTHER): Payer: Self-pay | Admitting: Family Medicine

## 2020-12-02 ENCOUNTER — Encounter (INDEPENDENT_AMBULATORY_CARE_PROVIDER_SITE_OTHER): Payer: Self-pay | Admitting: Family Medicine

## 2020-12-02 NOTE — Telephone Encounter (Signed)
Pt response.

## 2020-12-02 NOTE — Telephone Encounter (Signed)
Rx request for test strips and lancets.

## 2020-12-03 ENCOUNTER — Ambulatory Visit: Payer: Medicare Other | Admitting: Cardiology

## 2020-12-03 ENCOUNTER — Other Ambulatory Visit: Payer: Self-pay | Admitting: Urology

## 2020-12-06 ENCOUNTER — Other Ambulatory Visit (INDEPENDENT_AMBULATORY_CARE_PROVIDER_SITE_OTHER): Payer: Self-pay | Admitting: Family Medicine

## 2020-12-06 NOTE — Telephone Encounter (Signed)
Prescription request

## 2020-12-06 NOTE — Telephone Encounter (Signed)
Pt last seen by Dawn Whitmire, FNP.  

## 2020-12-07 ENCOUNTER — Encounter (HOSPITAL_BASED_OUTPATIENT_CLINIC_OR_DEPARTMENT_OTHER): Payer: Self-pay | Admitting: Urology

## 2020-12-09 ENCOUNTER — Other Ambulatory Visit (HOSPITAL_COMMUNITY)
Admission: RE | Admit: 2020-12-09 | Discharge: 2020-12-09 | Disposition: A | Discharge status: Home / Self Care | Payer: Medicare Other | Source: Ambulatory Visit | Attending: Urology | Admitting: Urology

## 2020-12-09 DIAGNOSIS — Z01812 Encounter for preprocedural laboratory examination: Secondary | ICD-10-CM | POA: Diagnosis present

## 2020-12-09 DIAGNOSIS — Z20822 Contact with and (suspected) exposure to covid-19: Secondary | ICD-10-CM | POA: Insufficient documentation

## 2020-12-10 ENCOUNTER — Encounter (HOSPITAL_BASED_OUTPATIENT_CLINIC_OR_DEPARTMENT_OTHER): Payer: Self-pay | Admitting: Urology

## 2020-12-10 ENCOUNTER — Other Ambulatory Visit: Payer: Self-pay

## 2020-12-10 LAB — SARS CORONAVIRUS 2 (TAT 6-24 HRS): SARS Coronavirus 2: NEGATIVE

## 2020-12-10 NOTE — Progress Notes (Signed)
Spoke w/ via phone for pre-op interview--- Pt Lab needs dos---- Istat              Lab results------ current ekg in epic/ chart COVID test ------ done 12-09-2020 negative result in epic  Arrive at ------- 0715 on 12-13-2020 NPO after MN NO Solid Food.  Clear liquids from MN until--- 0615 Med rec completed Medications to take morning of surgery ----- Gabapentin, Cymbalta, Wellbutrin, Prilosec Diabetic medication ----- do not take metformin morning of surgery  Patient instructed to bring photo id and insurance card day of surgery Patient aware to have Driver (ride ) / caregiver    for 24 hours after surgery -- husband, John Patient Special Instructions ----- asked to bring rescue inhaler dos  Pre-Op special Istructions ----- n/a Patient verbalized understanding of instructions that were given at this phone interview. Patient denies shortness of breath, chest pain, fever, cough at this phone interview.

## 2020-12-13 ENCOUNTER — Encounter (INDEPENDENT_AMBULATORY_CARE_PROVIDER_SITE_OTHER): Payer: Self-pay | Admitting: Family Medicine

## 2020-12-13 ENCOUNTER — Encounter (HOSPITAL_BASED_OUTPATIENT_CLINIC_OR_DEPARTMENT_OTHER): Payer: Self-pay | Admitting: Urology

## 2020-12-13 ENCOUNTER — Ambulatory Visit (HOSPITAL_BASED_OUTPATIENT_CLINIC_OR_DEPARTMENT_OTHER): Payer: Medicare Other | Admitting: Anesthesiology

## 2020-12-13 ENCOUNTER — Encounter (HOSPITAL_BASED_OUTPATIENT_CLINIC_OR_DEPARTMENT_OTHER)
Admission: RE | Disposition: A | Discharge status: Home / Self Care | Payer: Self-pay | Source: Home / Self Care | Attending: Urology

## 2020-12-13 ENCOUNTER — Ambulatory Visit (HOSPITAL_BASED_OUTPATIENT_CLINIC_OR_DEPARTMENT_OTHER)
Admission: RE | Admit: 2020-12-13 | Discharge: 2020-12-13 | Disposition: A | Discharge status: Home / Self Care | Payer: Medicare Other | Attending: Urology | Admitting: Urology

## 2020-12-13 DIAGNOSIS — Z9889 Other specified postprocedural states: Secondary | ICD-10-CM | POA: Insufficient documentation

## 2020-12-13 DIAGNOSIS — Z7984 Long term (current) use of oral hypoglycemic drugs: Secondary | ICD-10-CM | POA: Insufficient documentation

## 2020-12-13 DIAGNOSIS — N201 Calculus of ureter: Secondary | ICD-10-CM | POA: Insufficient documentation

## 2020-12-13 DIAGNOSIS — Z6841 Body Mass Index (BMI) 40.0 and over, adult: Secondary | ICD-10-CM | POA: Diagnosis not present

## 2020-12-13 DIAGNOSIS — Z79899 Other long term (current) drug therapy: Secondary | ICD-10-CM | POA: Diagnosis not present

## 2020-12-13 DIAGNOSIS — Z87891 Personal history of nicotine dependence: Secondary | ICD-10-CM | POA: Diagnosis not present

## 2020-12-13 HISTORY — DX: Personal history of pulmonary embolism: Z86.711

## 2020-12-13 HISTORY — DX: Generalized anxiety disorder: F41.1

## 2020-12-13 HISTORY — PX: CYSTOSCOPY WITH RETROGRADE PYELOGRAM, URETEROSCOPY AND STENT PLACEMENT: SHX5789

## 2020-12-13 HISTORY — DX: Localized edema: R60.0

## 2020-12-13 HISTORY — DX: Calculus of ureter: N20.1

## 2020-12-13 HISTORY — DX: Type 2 diabetes mellitus without complications: E11.9

## 2020-12-13 HISTORY — DX: Mild persistent asthma, uncomplicated: J45.30

## 2020-12-13 HISTORY — DX: Urgency of urination: R39.15

## 2020-12-13 HISTORY — DX: Personal history of other diseases of the digestive system: Z87.19

## 2020-12-13 HISTORY — DX: Major depressive disorder, single episode, unspecified: F32.9

## 2020-12-13 HISTORY — DX: Presence of spectacles and contact lenses: Z97.3

## 2020-12-13 HISTORY — PX: HOLMIUM LASER APPLICATION: SHX5852

## 2020-12-13 LAB — POCT I-STAT, CHEM 8
BUN: 21 mg/dL (ref 8–23)
Calcium, Ion: 1.26 mmol/L (ref 1.15–1.40)
Chloride: 103 mmol/L (ref 98–111)
Creatinine, Ser: 0.7 mg/dL (ref 0.44–1.00)
Glucose, Bld: 119 mg/dL — ABNORMAL HIGH (ref 70–99)
HCT: 38 % (ref 36.0–46.0)
Hemoglobin: 12.9 g/dL (ref 12.0–15.0)
Potassium: 4.1 mmol/L (ref 3.5–5.1)
Sodium: 141 mmol/L (ref 135–145)
TCO2: 26 mmol/L (ref 22–32)

## 2020-12-13 LAB — GLUCOSE, CAPILLARY: Glucose-Capillary: 133 mg/dL — ABNORMAL HIGH (ref 70–99)

## 2020-12-13 SURGERY — CYSTOURETEROSCOPY, WITH RETROGRADE PYELOGRAM AND STENT INSERTION
Anesthesia: General | Site: Ureter | Laterality: Left

## 2020-12-13 MED ORDER — PROPOFOL 10 MG/ML IV BOLUS
INTRAVENOUS | Status: AC
  Filled 2020-12-13: qty 20

## 2020-12-13 MED ORDER — ONDANSETRON HCL 4 MG/2ML IJ SOLN
INTRAMUSCULAR | Status: DC | PRN
  Administered 2020-12-13: 4 mg via INTRAVENOUS

## 2020-12-13 MED ORDER — MIDAZOLAM HCL 2 MG/2ML IJ SOLN
INTRAMUSCULAR | Status: AC
  Filled 2020-12-13: qty 2

## 2020-12-13 MED ORDER — GENTAMICIN SULFATE 40 MG/ML IJ SOLN
380.0000 mg | INTRAVENOUS | Status: AC
  Administered 2020-12-13: 380 mg via INTRAVENOUS
  Filled 2020-12-13: qty 9.5

## 2020-12-13 MED ORDER — SODIUM CHLORIDE 0.9 % IR SOLN
Status: DC | PRN
  Administered 2020-12-13: 3000 mL via INTRAVESICAL

## 2020-12-13 MED ORDER — DEXAMETHASONE SODIUM PHOSPHATE 10 MG/ML IJ SOLN
INTRAMUSCULAR | Status: AC
  Filled 2020-12-13: qty 1

## 2020-12-13 MED ORDER — FENTANYL CITRATE (PF) 100 MCG/2ML IJ SOLN
INTRAMUSCULAR | Status: DC | PRN
  Administered 2020-12-13 (×2): 50 ug via INTRAVENOUS

## 2020-12-13 MED ORDER — LACTATED RINGERS IV SOLN: INTRAVENOUS | Status: DC

## 2020-12-13 MED ORDER — KETOROLAC TROMETHAMINE 30 MG/ML IJ SOLN
INTRAMUSCULAR | Status: AC
  Filled 2020-12-13: qty 1

## 2020-12-13 MED ORDER — DEXAMETHASONE SODIUM PHOSPHATE 10 MG/ML IJ SOLN
INTRAMUSCULAR | Status: DC | PRN
  Administered 2020-12-13: 10 mg via INTRAVENOUS

## 2020-12-13 MED ORDER — OXYCODONE HCL 5 MG/5ML PO SOLN: 5.0000 mg | Freq: Once | ORAL | Status: AC | PRN

## 2020-12-13 MED ORDER — LIDOCAINE 2% (20 MG/ML) 5 ML SYRINGE
INTRAMUSCULAR | Status: DC | PRN
  Administered 2020-12-13: 100 mg via INTRAVENOUS
  Administered 2020-12-13: 20 mg via INTRAVENOUS

## 2020-12-13 MED ORDER — IOHEXOL 300 MG/ML  SOLN
INTRAMUSCULAR | Status: DC | PRN
  Administered 2020-12-13: 5 mL

## 2020-12-13 MED ORDER — ACETAMINOPHEN 500 MG PO TABS
1000.0000 mg | ORAL_TABLET | Freq: Once | ORAL | Status: AC
  Administered 2020-12-13: 1000 mg via ORAL

## 2020-12-13 MED ORDER — FENTANYL CITRATE (PF) 100 MCG/2ML IJ SOLN
INTRAMUSCULAR | Status: AC
  Filled 2020-12-13: qty 2

## 2020-12-13 MED ORDER — PROPOFOL 10 MG/ML IV BOLUS
INTRAVENOUS | Status: DC | PRN
  Administered 2020-12-13: 150 mg via INTRAVENOUS
  Administered 2020-12-13: 20 mg via INTRAVENOUS

## 2020-12-13 MED ORDER — PROMETHAZINE HCL 25 MG/ML IJ SOLN: 6.2500 mg | INTRAMUSCULAR | Status: DC | PRN

## 2020-12-13 MED ORDER — KETOROLAC TROMETHAMINE 30 MG/ML IJ SOLN
INTRAMUSCULAR | Status: DC | PRN
  Administered 2020-12-13: 30 mg via INTRAVENOUS

## 2020-12-13 MED ORDER — SCOPOLAMINE 1 MG/3DAYS TD PT72
MEDICATED_PATCH | TRANSDERMAL | Status: AC
  Filled 2020-12-13: qty 1

## 2020-12-13 MED ORDER — FENTANYL CITRATE (PF) 100 MCG/2ML IJ SOLN
25.0000 ug | INTRAMUSCULAR | Status: DC | PRN
  Administered 2020-12-13: 50 ug via INTRAVENOUS

## 2020-12-13 MED ORDER — CEPHALEXIN 500 MG PO CAPS: 500.0000 mg | ORAL_CAPSULE | Freq: Two times a day (BID) | ORAL | 0 refills | Status: AC

## 2020-12-13 MED ORDER — OXYCODONE HCL 5 MG PO TABS
5.0000 mg | ORAL_TABLET | Freq: Once | ORAL | Status: AC | PRN
  Administered 2020-12-13: 5 mg via ORAL

## 2020-12-13 MED ORDER — ACETAMINOPHEN 500 MG PO TABS
ORAL_TABLET | ORAL | Status: AC
  Filled 2020-12-13: qty 2

## 2020-12-13 MED ORDER — AMISULPRIDE (ANTIEMETIC) 5 MG/2ML IV SOLN: 10.0000 mg | Freq: Once | INTRAVENOUS | Status: DC | PRN

## 2020-12-13 MED ORDER — OXYCODONE HCL 5 MG PO TABS
ORAL_TABLET | ORAL | Status: AC
  Filled 2020-12-13: qty 1

## 2020-12-13 MED ORDER — ONDANSETRON HCL 4 MG/2ML IJ SOLN
INTRAMUSCULAR | Status: AC
  Filled 2020-12-13: qty 2

## 2020-12-13 MED ORDER — SCOPOLAMINE 1 MG/3DAYS TD PT72
1.0000 | MEDICATED_PATCH | TRANSDERMAL | Status: DC
  Administered 2020-12-13: 1.5 mg via TRANSDERMAL

## 2020-12-13 MED ORDER — MIDAZOLAM HCL 2 MG/2ML IJ SOLN
INTRAMUSCULAR | Status: DC | PRN
  Administered 2020-12-13: 2 mg via INTRAVENOUS

## 2020-12-13 MED ORDER — LIDOCAINE 2% (20 MG/ML) 5 ML SYRINGE
INTRAMUSCULAR | Status: AC
  Filled 2020-12-13: qty 5

## 2020-12-13 SURGICAL SUPPLY — 28 items
BAG DRAIN URO-CYSTO SKYTR STRL (DRAIN) ×2 IMPLANT
BAG DRN UROCATH (DRAIN) ×1
BASKET STONE 1.7 NGAGE (UROLOGICAL SUPPLIES) ×1 IMPLANT
BASKET ZERO TIP NITINOL 2.4FR (BASKET) IMPLANT
BSKT STON RTRVL ZERO TP 2.4FR (BASKET)
CATH INTERMIT  6FR 70CM (CATHETERS) ×2 IMPLANT
CLOTH BEACON ORANGE TIMEOUT ST (SAFETY) ×2 IMPLANT
COVER DOME SNAP 22 D (MISCELLANEOUS) ×1 IMPLANT
ELECT REM PT RETURN 9FT ADLT (ELECTROSURGICAL)
ELECTRODE REM PT RTRN 9FT ADLT (ELECTROSURGICAL) IMPLANT
FIBER LASER FLEXIVA 200 (UROLOGICAL SUPPLIES) IMPLANT
FIBER LASER FLEXIVA 365 (UROLOGICAL SUPPLIES) ×1 IMPLANT
GLOVE SURG ENC MOIS LTX SZ8 (GLOVE) ×2 IMPLANT
GOWN STRL REUS W/TWL LRG LVL3 (GOWN DISPOSABLE) ×2 IMPLANT
GUIDEWIRE ANG ZIPWIRE 038X150 (WIRE) IMPLANT
GUIDEWIRE STR DUAL SENSOR (WIRE) ×1 IMPLANT
IV NS IRRIG 3000ML ARTHROMATIC (IV SOLUTION) ×4 IMPLANT
KIT TURNOVER CYSTO (KITS) ×2 IMPLANT
MANIFOLD NEPTUNE II (INSTRUMENTS) ×2 IMPLANT
NS IRRIG 500ML POUR BTL (IV SOLUTION) ×2 IMPLANT
PACK CYSTO (CUSTOM PROCEDURE TRAY) ×2 IMPLANT
SHEATH URETERAL 12FRX28CM (UROLOGICAL SUPPLIES) ×1 IMPLANT
SHEATH URETERAL 12FRX35CM (MISCELLANEOUS) IMPLANT
STENT URET 6FRX24 CONTOUR (STENTS) ×1 IMPLANT
TRACTIP FLEXIVA PULS ID 200XHI (Laser) IMPLANT
TRACTIP FLEXIVA PULSE ID 200 (Laser)
TUBE CONNECTING 12X1/4 (SUCTIONS) IMPLANT
TUBING UROLOGY SET (TUBING) IMPLANT

## 2020-12-13 NOTE — Anesthesia Procedure Notes (Signed)
Procedure Name: LMA Insertion Date/Time: 12/13/2020 9:34 AM Performed by: Norva Pavlov, CRNA Pre-anesthesia Checklist: Patient identified, Emergency Drugs available, Suction available and Patient being monitored Patient Re-evaluated:Patient Re-evaluated prior to induction Oxygen Delivery Method: Circle system utilized Preoxygenation: Pre-oxygenation with 100% oxygen Induction Type: IV induction Ventilation: Mask ventilation without difficulty LMA: LMA inserted LMA Size: 4.0 Number of attempts: 1 Airway Equipment and Method: Bite block Placement Confirmation: positive ETCO2 Tube secured with: Tape Dental Injury: Teeth and Oropharynx as per pre-operative assessment

## 2020-12-13 NOTE — H&P (Signed)
H&P  Chief Complaint: Kidney stone  History of Present Illness: Sally [name] is a 60 y.o. year old female presents at this time for ureteroscopic management of a left mid ureteral stone.  She initially presented on 11/17/2020 with fever, UTI and an obstructing left mid ureteral calculus.  She was urgently stented, admitted for antibiotic management, and presents at this time for definitive ureteroscopy, holmium laser of the stone and extraction with stent placement.  Past Medical History:  Diagnosis Date  . ADD (attention deficit disorder)   . Anemia   . CFS (chronic fatigue syndrome)   . Edema of both lower extremities   . Fibromyalgia   . GAD (generalized anxiety disorder)   . GERD (gastroesophageal reflux disease)   . History of colitis   . History of pulmonary embolus (PE)    per pt in 2019 told probable PE left lung, completed blood thnner treatment,  stated no clot before or since  . History of sepsis 11/17/2020   hospital admission, dx acute left pyelonephritis secondary to obstruction uropathy due to left ureter stone  . History of sepsis 11/17/2020   hospital admission, dx septic shock , e.coli, acute pyelonephritis due to kidney stone obstruction  . IBS (irritable bowel syndrome)   . Left ureteral stone   . MDD (major depressive disorder)   . Mild persistent asthma   . Osteoarthritis   . RA (rheumatoid arthritis) [facility])    rheumotology--- dr Van Clines  . RLS (restless legs syndrome)   . Type 2 diabetes mellitus ([place])    followed by pcp  (12-10-2020 pt stated checks daily in am,  fasting sugar-- 120--140)  . Urgency of urination   . Vitamin B 12 deficiency   . Vitamin D deficiency   . Wears glasses     Past Surgical History:  Procedure Laterality Date  . COLONOSCOPY  2012  . CYSTOSCOPY W/ URETERAL STENT PLACEMENT Left 11/17/2020   Procedure: CYSTOSCOPY LEFT WITH RETROGRADE PYELOGRAM/URETERAL STENT PLACEMENT LEFT DOUBLE J PLACEMENT ;  Surgeon: Franchot Gallo, MD;   Location: [facility];  Service: Urology;  Laterality: Left;  . EYE SURGERY  chlid   pt stated removal foreign body, unsure which eye  . MOUTH SURGERY      Home Medications:  Medications Prior to Admission  Medication Sig Dispense Refill  . albuterol (PROVENTIL HFA;VENTOLIN HFA) 108 (90 BASE) MCG/ACT inhaler Inhale 2 puffs into the lungs every 6 (six) hours as needed. FOR WHEEZING    . buPROPion (WELLBUTRIN SR) 150 MG 12 hr tablet Take 1 tablet (150 mg total) by mouth daily. (Patient taking differently: Take 150 mg by mouth 2 (two) times daily.) 90 tablet 0  . DULoxetine (CYMBALTA) 30 MG capsule Take 90 mg by mouth daily.    . Etanercept (ENBREL [place]) Inject into the skin once a week. Inject dose once per week (on Saturdays)    . folic acid (FOLVITE) 1 MG tablet Take 1 mg by mouth daily.    . furosemide (LASIX) 20 MG tablet Take 20 mg by mouth daily as needed.    . gabapentin (NEURONTIN) 300 MG capsule Take 600 mg by mouth 3 (three) times daily. 2 capsules in the AM, 1 capsule at lunch as needed, 2 capsules at bedtime  0  . metFORMIN (GLUCOPHAGE-XR) 500 MG 24 hr tablet TAKE 1 TABLET BY MOUTH  TWICE DAILY (Patient taking differently: Take 500 mg by mouth 2 (two) times daily.) 180 tablet 3  . montelukast (SINGULAIR) 10 MG  tablet Take 10 mg by mouth at bedtime.    . Multiple Vitamins-Minerals (HAIR SKIN & NAILS ADVANCED PO) Take 2 tablets by mouth 2 (two) times daily.    [name] omeprazole (PRILOSEC) 20 MG capsule Take 1 capsule (20 mg total) by mouth daily. (Patient taking differently: Take 20 mg by mouth daily.) 30 capsule 0  . Vitamin D, Ergocalciferol, (DRISDOL) 1.25 MG (50000 UNIT) CAPS capsule Take 1 capsule (50,000 Units total) by mouth every 7 (seven) days. (Patient taking differently: Take 50,000 Units by mouth every 7 (seven) days. Saturday's) 4 capsule 0  . blood glucose meter kit and supplies KIT Dispense based on patient and insurance preference. Use up to four times daily as directed. 1 each 0  .  blood glucose meter kit and supplies Dispense based on patient and insurance preference. Use up to four times daily as directed. (FOR ICD-10 E10.9, E11.9). Test twice daily 1 each 0  . Blood Glucose Monitoring Suppl (ACCU-CHEK GUIDE) w/Device KIT USE TO CHECK BLOOD SUGAR UP TO FOUR TIMES A DAY AS  DIRECTED 1 kit 0  . cyclobenzaprine (FLEXERIL) 10 MG tablet Take 10 mg by mouth 3 (three) times daily as needed for muscle spasms.    . mupirocin ointment (BACTROBAN) 2 % Place 1 application into the nose 2 (two) times daily as needed.      Allergies: No Known Allergies  Family History  Problem Relation Age of Onset  . Pneumonia Mother   . Asthma Mother   . Diabetes Mother   . Hypertension Mother   . Depression Mother   . Anxiety disorder Mother   . Eating disorder Mother   . Obesity Mother   . Sudden death Mother   . Cancer Father        Brain tumor  . Asthma Father     Social History:  reports that she quit smoking about 24 years ago. Her smoking use included cigarettes. She has a 12.00 pack-year smoking history. She has never used smokeless tobacco. She reports that she does not drink alcohol and does not use drugs.  ROS: A complete review of systems was performed.  All systems are negative except for pertinent findings as noted.  Physical Exam:  Vital signs in last 24 hours: Temp:  [97.8 F (36.6 C)] 97.8 F (36.6 C) (05/16 0742) Pulse Rate:  [77] 77 (05/16 0742) Resp:  [18] 18 (05/16 0742) BP: (150)/(84) 150/84 (05/16 0742) SpO2:  [97 %] 97 % (05/16 0742) Weight:  [115.8 kg] 115.8 kg (05/16 0742) General:  Alert and oriented, No acute distress HEENT: Normocephalic, atraumatic Neck: No JVD or lymphadenopathy Cardiovascular: Regular rate  Lungs: Normal inspiratory/expiratory excursion Abdomen: Soft, nontender, nondistended, no abdominal masses Back: No CVA tenderness Extremities: No edema Neurologic: Grossly intact  Laboratory Data:  Results for orders placed or  performed during the hospital encounter of 12/13/20 (from the past 24 hour(s))  I-STAT, chem 8     Status: Abnormal   Collection Time: 12/13/20  8:11 AM  Result Value Ref Range   Sodium 141 135 - 145 mmol/L   Potassium 4.1 3.5 - 5.1 mmol/L   Chloride 103 98 - 111 mmol/L   BUN 21 8 - 23 mg/dL   Creatinine, Ser 0.70 0.44 - 1.00 mg/dL   Glucose, Bld 119 (H) 70 - 99 mg/dL   Calcium, Ion 1.26 1.15 - 1.40 mmol/L   TCO2 26 22 - 32 mmol/L   Hemoglobin 12.9 12.0 - 15.0 g/dL   HCT  38.0 36.0 - 46.0 %   Recent Results (from the past 240 hour(s))  SARS CORONAVIRUS 2 (TAT 6-24 HRS) Nasopharyngeal Nasopharyngeal Swab     Status: None   Collection Time: 12/09/20  2:24 PM   Specimen: Nasopharyngeal Swab  Result Value Ref Range Status   SARS Coronavirus 2 NEGATIVE NEGATIVE Final    Comment: (NOTE) SARS-CoV-2 target nucleic acids are NOT DETECTED.  The SARS-CoV-2 RNA is generally detectable in upper and lower respiratory specimens during the acute phase of infection. Negative results do not preclude SARS-CoV-2 infection, do not rule out co-infections with other pathogens, and should not be used as the sole basis for treatment or other patient management decisions. Negative results must be combined with clinical observations, patient history, and epidemiological information. The expected result is Negative.  Fact Sheet for Patients: SugarRoll.be  Fact Sheet for Healthcare Providers: https://www.woods-mathews.com/  This test is not yet approved or cleared by the [place] FDA and  has been authorized for detection and/or diagnosis of SARS-CoV-2 by FDA under an Emergency Use Authorization (EUA). This EUA will remain  in effect (meaning this test can be used) for the duration of the COVID-19 declaration under Se ction 564(b)(1) of the Act, 21 U.S.C. section 360bbb-3(b)(1), unless the authorization is terminated or revoked sooner.  Performed at [facility], [place] [address], [place], [place] [address]    Creatinine: Recent Labs    12/13/20 1324  CREATININE 0.70    Radiologic Imaging: No results found.  Impression/Assessment:  Left mid ureteral stone, status post stenting and management of UTI  Plan:  Cystoscopy, left double-J stent extraction, left ureteroscopy, possible holmium laser lithotripsy and extraction of stone, double-J stent placement  Lillette Boxer Dahlstedt 12/13/2020, 8:49 AM  Lillette Boxer. Dahlstedt MD

## 2020-12-13 NOTE — Transfer of Care (Signed)
Immediate Anesthesia Transfer of Care Note  Patient: Sally [name]  Procedure(s) Performed: Procedure(s) (LRB): CYSTOSCOPY WITH RETROGRADE PYELOGRAM, URETEROSCOPY AND STENT PLACEMENT (Left) HOLMIUM LASER APPLICATION (Left)  Patient Location: PACU  Anesthesia Type: General  Level of Consciousness: awake, alert  and oriented  Airway & Oxygen Therapy: Patient Spontanous Breathing and Patient connected to nasal cannula oxygen  Post-op Assessment: Report given to PACU RN and Post -op Vital signs reviewed and stable  Post vital signs: Reviewed and stable  Complications: No apparent anesthesia complications  Last Vitals:  Vitals Value Taken Time  BP 165/92 12/13/20 1019  Temp 36.3 C 12/13/20 1019  Pulse 80 12/13/20 1023  Resp 13 12/13/20 1023  SpO2 99 % 12/13/20 1023  Vitals shown include unvalidated device data.  Last Pain:  Vitals:   12/13/20 0742  TempSrc: Oral  PainSc: 3       Patients Stated Pain Goal: 3 (12/13/20 0742)  Complications: No complications documented.

## 2020-12-13 NOTE — Interval H&P Note (Signed)
History and Physical Interval Note:  12/13/2020 9:07 AM  Nelta Numbers  has presented today for surgery, with the diagnosis of LEFT URETERAL STONE.  The various methods of treatment have been discussed with the patient and family. After consideration of risks, benefits and other options for treatment, the patient has consented to  Procedure(s) with comments: CYSTOSCOPY WITH RETROGRADE PYELOGRAM, URETEROSCOPY AND STENT PLACEMENT (Left) - 75 MINS HOLMIUM LASER APPLICATION (Left) as a surgical intervention.  The patient's history has been reviewed, patient examined, no change in status, stable for surgery.  I have reviewed the patient's chart and labs.  Questions were answered to the patient's satisfaction.     Bertram Millard Daleyssa Loiselle

## 2020-12-13 NOTE — Op Note (Signed)
Preoperative diagnosis: History of left mid ureteral stone with pyelonephrosis/sepsis, status post stenting  Postoperative diagnosis: Same  Principal procedure: Cystoscopy, left double-J stent extraction, left ureteroscopy, holmium laser and extraction of left ureteral stone, placement of 6 French by 24 cm contour double-J stent with tether, fluoroscopic interpretation  Surgeon: Rut Betterton  Anesthesia: General with LMA  Complications: None  Estimated blood loss: None  Stent: Contour stent, 6 x 24 with tether  Specimen: Stone fragments  Indications: 60 year old female presenting for definitive management of a left mid ureteral stone that initially presented with sepsis/UTI and hydronephrosis.  She underwent urgent stenting in April.  Recent follow-up revealed the patient's urinary tract infection to be resolved and she presents at this time for definitive management of this stone.  Findings: Bladder was normal, ureteral orifice ease normal.  Stent located at the left ureteral orifice.  Upon removal of the stent and retrograde study, the ureter was normal in caliber throughout.  There was a filling defect in the mid ureter.  Pyelocalyceal system was mildly dilated.  No filling defects were seen there.  Description of procedure: The patient was properly identified and marked in the holding area.  She received preoperative IV antibiotics.  She was taken to the operating room where general anesthetic was administered with the LMA.  She was placed in the dorsolithotomy position.  Genitalia and perineum were prepped, draped, proper timeout performed.  21 French panendoscope advanced into the bladder.  The bladder was inspected, the stent was extracted.  Using a 6 [place] open-ended catheter and gentle retrograde ureteropyelogram was performed with Omnipaque with the above-mentioned findings.  Following this, the sensor tip guidewire was advanced through the open-ended catheter and using fluoroscopic  guidance this was placed in the upper pole calyceal system.  The cystoscope was removed and replaced with a 6 French short dual-lumen semirigid ureteroscope.  This was easily advanced up to the stone.  I tried to grasp it and extracted, but the ureter was tight in the distal ureter.  I then dilated this first with the obturator and then the entire 12/14 ureteral access catheter.  Again, I was unable to extract the stone.  I then used the 365 m laser fiber and using energy of 0.5 J and frequency of 13, the stone was fragmented into multiple smaller fragments which were then easily extracted into the bladder.  All sizable fragments were easily extracted.  Inspection of the ureter all the way to the ureteropelvic junction revealed no further stones.  The ureteroscope was then removed.  I then placed the cystoscope and drained the stone fragments for specimen.  The guidewire was then backloaded through the cystoscope and I placed a 24 cm x 6 French contour stent with a tether remaining.  Excellent proximal and distal curls were seen following deployment with removal of the guidewire.  At this point the bladder was drained.  The scope was removed.  I trimmed the tether after tying it in a knot outside of the urethral meatus.  This was then tucked within the vagina.  The procedure was then terminated.  The patient was awakened and taken to the PACU in stable condition, having tolerated the procedure well.

## 2020-12-13 NOTE — Anesthesia Postprocedure Evaluation (Signed)
Anesthesia Post Note  Patient: ANTONIQUE LANGFORD  Procedure(s) Performed: CYSTOSCOPY WITH RETROGRADE PYELOGRAM, URETEROSCOPY AND STENT PLACEMENT (Left Ureter) HOLMIUM LASER APPLICATION (Left Ureter)     Patient location during evaluation: PACU Anesthesia Type: General Level of consciousness: sedated Pain management: pain level controlled Vital Signs Assessment: post-procedure vital signs reviewed and stable Respiratory status: spontaneous breathing and respiratory function stable Cardiovascular status: stable Postop Assessment: no apparent nausea or vomiting Anesthetic complications: no   No complications documented.  Last Vitals:  Vitals:   12/13/20 1030 12/13/20 1125  BP: 137/61 (!) 145/65  Pulse: 75 69  Resp: 14 16  Temp:  36.4 C  SpO2: 95% 100%    Last Pain:  Vitals:   12/13/20 1125  TempSrc:   PainSc: 4                  Candra R Ronnisha Felber

## 2020-12-13 NOTE — Anesthesia Preprocedure Evaluation (Addendum)
Anesthesia Evaluation  Patient identified by MRN, date of birth, ID band Patient awake    Reviewed: Allergy & Precautions, NPO status , Patient's Chart, lab work & pertinent test results  History of Anesthesia Complications Negative for: history of anesthetic complications  Airway Mallampati: II  TM Distance: >3 FB Neck ROM: Full    Dental  (+) Dental Advisory Given Several missing posterior teeth:   Pulmonary asthma , former smoker,    Pulmonary exam normal        Cardiovascular + DVT  Normal cardiovascular exam     Neuro/Psych PSYCHIATRIC DISORDERS Anxiety Depression negative neurological ROS     GI/Hepatic Neg liver ROS, GERD  Medicated,  Endo/Other  diabetes, Type 2, Oral Hypoglycemic AgentsMorbid obesity  Renal/GU   negative genitourinary   Musculoskeletal  (+) Arthritis , Rheumatoid disorders,  Fibromyalgia -  Abdominal   Peds negative pediatric ROS (+)  Hematology negative hematology ROS (+)   Anesthesia Other Findings   Reproductive/Obstetrics negative OB ROS                            Anesthesia Physical  Anesthesia Plan  ASA: III  Anesthesia Plan: General   Post-op Pain Management:    Induction: Intravenous  PONV Risk Score and Plan: 4 or greater and Ondansetron, Dexamethasone and Scopolamine patch - Pre-op  Airway Management Planned: LMA  Additional Equipment:   Intra-op Plan:   Post-operative Plan: Extubation in OR  Informed Consent: I have reviewed the patients History and Physical, chart, labs and discussed the procedure including the risks, benefits and alternatives for the proposed anesthesia with the patient or authorized representative who has indicated his/her understanding and acceptance.     Dental advisory given and Consent reviewed with POA  Plan Discussed with: Anesthesiologist and CRNA  Anesthesia Plan Comments:         Anesthesia  Quick Evaluation

## 2020-12-13 NOTE — Discharge Instructions (Signed)
1. You may see some blood in the urine and may have some burning with urination for 48-72 hours. You also may notice that you have to urinate more frequently or urgently after your procedure which is normal.  2. You should call should you develop an inability urinate, fever > 101, persistent nausea and vomiting that prevents you from eating or drinking to stay hydrated.  3. If you have a stent, you will likely urinate more frequently and urgently until the stent is removed and you may experience some discomfort/pain in the lower abdomen and flank especially when urinating. You may take pain medication prescribed to you if needed for pain. You may also intermittently have blood in the urine until the stent is removed.  It is okay to remove the stent by pulling on the threads within your vagina on Thursday, May 19 [facility] [phone] Post Ureteroscopy With or Without Stent Instructions  Definitions:  Ureter: The duct that transports urine from the kidney to the bladder. Stent:   A plastic hollow tube that is placed into the ureter, from the kidney to the bladder to prevent the ureter from swelling shut.  GENERAL INSTRUCTIONS:  Despite the fact that no skin incisions were used, the area around the ureter and bladder is raw and irritated. The stent is a foreign body which will further irritate the bladder wall. This irritation is manifested by increased frequency of urination, both day and night, and by an increase in the urge to urinate. In some, the urge to urinate is present almost always. Sometimes the urge is strong enough that you may not be able to stop yourself from urinating. The only real cure is to remove the stent and then give time for the bladder wall to heal which can't be done until the danger of the ureter swelling shut has passed, which varies.  You may see some blood in your urine while the stent is in place and a few days afterwards. Do not be alarmed, even if  the urine was clear for a while. Get off your feet and drink lots of fluids until clearing occurs. If you start to pass clots or don't improve, call us.  DIET: You may return to your normal diet immediately. Because of the raw surface of your bladder, alcohol, spicy foods, acid type foods and drinks with caffeine may cause irritation or frequency and should be used in moderation. To keep your urine flowing freely and to avoid constipation, drink plenty of fluids during the day ( 8-10 glasses ). Tip: Avoid cranberry juice because it is very acidic.  ACTIVITY: Your physical activity doesn't need to be restricted. However, if you are very active, you may see some blood in your urine. We suggest that you reduce your activity under these circumstances until the bleeding has stopped.  BOWELS: It is important to keep your bowels regular during the postoperative period. Straining with bowel movements can cause bleeding. A bowel movement every other day is reasonable. Use a mild laxative if needed, such as Milk of Magnesia 2-3 tablespoons, or 2 Dulcolax tablets. Call if you continue to have problems. If you have been taking narcotics for pain, before, during or after your surgery, you may be constipated. Take a laxative if necessary.   MEDICATION: You should resume your pre-surgery medications unless told not to. In addition you will often be given an antibiotic to prevent infection. These should be taken as prescribed until the bottles are finished unless you are  having an unusual reaction to one of the drugs.  PROBLEMS YOU SHOULD REPORT TO [name]:  Fevers over 100.5 Fahrenheit.  Heavy bleeding, or clots ( See above notes about blood in urine ).  Inability to urinate.  Drug reactions ( hives, rash, nausea, vomiting, diarrhea ).  Severe burning or pain with urination that is not improving.  FOLLOW-UP: You will need a follow-up appointment to monitor your progress. Call for this appointment at the  number listed above. Usually the first appointment will be about three to fourteen days after your surgery.   Post Anesthesia Home Care Instructions  Activity: Get plenty of rest for the remainder of the day. A responsible individual must stay with you for 24 hours following the procedure.  For the next 24 hours, DO NOT: -Drive a car -Advertising copywriter -Drink alcoholic beverages -Take any medication unless instructed by your physician -Make any legal decisions or sign important papers.  Meals: Start with liquid foods such as gelatin or soup. Progress to regular foods as tolerated. Avoid greasy, spicy, heavy foods. If nausea and/or vomiting occur, drink only clear liquids until the nausea and/or vomiting subsides. Call your physician if vomiting continues.  Special Instructions/Symptoms: Your throat may feel dry or sore from the anesthesia or the breathing tube placed in your throat during surgery. If this causes discomfort, gargle with warm salt water. The discomfort should disappear within 24 hours.  If you had a scopolamine patch placed behind your ear for the management of post- operative nausea and/or vomiting:  1. The medication in the patch is effective for 72 hours, after which it should be removed.  Wrap patch in a tissue and discard in the trash. Wash hands thoroughly with soap and water. 2. You may remove the patch earlier than 72 hours if you experience unpleasant side effects which may include dry mouth, dizziness or visual disturbances. 3. Avoid touching the patch. Wash your hands with soap and water after contact with the patch.  No ibuprofen, Advil, Aleve, Motrin, or naproxen until after 4:00 pm today if needed.

## 2020-12-14 ENCOUNTER — Ambulatory Visit: Payer: Medicare Other | Admitting: Skilled Nursing Facility1

## 2020-12-15 ENCOUNTER — Encounter (HOSPITAL_BASED_OUTPATIENT_CLINIC_OR_DEPARTMENT_OTHER): Payer: Self-pay | Admitting: Urology

## 2020-12-15 NOTE — Telephone Encounter (Signed)
Pt response. Also The rx assist. Program she was wanting to apply for required that the medication be sent to our office which we can't do per Marylu Lund. Pt was able to get this done through her PCP while you were out of the office.

## 2020-12-21 ENCOUNTER — Ambulatory Visit (INDEPENDENT_AMBULATORY_CARE_PROVIDER_SITE_OTHER): Payer: Medicare Other | Admitting: Family Medicine

## 2020-12-21 ENCOUNTER — Encounter (INDEPENDENT_AMBULATORY_CARE_PROVIDER_SITE_OTHER): Payer: Self-pay

## 2020-12-28 ENCOUNTER — Encounter: Payer: Medicare PPO | Admitting: Gynecology

## 2021-01-03 ENCOUNTER — Institutional Professional Consult (permissible substitution): Payer: Medicare Other | Admitting: Neurology

## 2021-01-05 ENCOUNTER — Other Ambulatory Visit: Payer: Self-pay

## 2021-01-05 ENCOUNTER — Ambulatory Visit (INDEPENDENT_AMBULATORY_CARE_PROVIDER_SITE_OTHER): Payer: Medicare Other | Admitting: Internal Medicine

## 2021-01-05 ENCOUNTER — Encounter: Payer: Self-pay | Admitting: Internal Medicine

## 2021-01-05 ENCOUNTER — Ambulatory Visit (INDEPENDENT_AMBULATORY_CARE_PROVIDER_SITE_OTHER): Payer: Medicare Other

## 2021-01-05 DIAGNOSIS — R058 Other specified cough: Secondary | ICD-10-CM | POA: Diagnosis not present

## 2021-01-05 DIAGNOSIS — J45991 Cough variant asthma: Secondary | ICD-10-CM | POA: Diagnosis not present

## 2021-01-05 DIAGNOSIS — R9389 Abnormal findings on diagnostic imaging of other specified body structures: Secondary | ICD-10-CM | POA: Diagnosis not present

## 2021-01-05 MED ORDER — BUDESONIDE-FORMOTEROL FUMARATE 80-4.5 MCG/ACT IN AERO: INHALATION_SPRAY | RESPIRATORY_TRACT | 11 refills | Status: DC

## 2021-01-05 MED ORDER — OMEPRAZOLE 40 MG PO CPDR: 40.0000 mg | DELAYED_RELEASE_CAPSULE | Freq: Every day | ORAL | 2 refills | Status: DC

## 2021-01-05 MED ORDER — FAMOTIDINE 20 MG PO TABS: ORAL_TABLET | ORAL | 11 refills | Status: DC

## 2021-01-05 NOTE — Patient Instructions (Addendum)
Try prilosec 40 mg   30-60 min before first meal of the day and Pepcid ac (famotidine) 20 mg  After supper along with chlorpheniramine 1-2 1 hour before  bedtime until cough is completely gone for at least a week without the need for cough suppression  For drainage / throat tickle try take CHLORPHENIRAMINE  4 mg  (Chlortab 4mg   at should be easiest to find in the green box)    GERD (REFLUX)  is an extremely common cause of respiratory symptoms just like yours , many times with no obvious heartburn at all.    It can be treated with medication, but also with lifestyle changes including elevation of the head of your bed (ideally with 6 -8inch blocks under the headboard of your bed),  Smoking cessation, avoidance of late meals, excessive alcohol, and avoid fatty foods, chocolate, peppermint, colas, red wine, and acidic juices such as orange juice.  NO MINT OR MENTHOL PRODUCTS SO NO COUGH DROPS  USE SUGARLESS CANDY INSTEAD (Jolley ranchers or Stover's or Life Savers) or even ice chips will also do - the key is to swallow to prevent all throat clearing. NO OIL BASED VITAMINS - use powdered substitutes.  Avoid fish oil when coughing.  Next flare of asthma try symbicort 80 2puffs evey 12 hour x at least a week    Please remember to go to the  x-ray department  for your tests - we will call you with the results when they are available     Pulmonary follow up is as needed

## 2021-01-05 NOTE — Progress Notes (Signed)
Sally [name], female    DOB: Jun 15, 1961     MRN: [id number]   Brief patient profile:  85 yowf from [place] quit smoking 1999 "born with asthma" intermittent since childhood esp in fall / spring  On prn saba and prn prednisone and also has RA [place] and f/b Ariel avg prednisone  4-5 x per year assoc with wt baseline 260 but L kidney stone / urosepsis s/p stent may 2022 with abn portable cxr referred to pulmonary clinic 01/05/2021 by Dr   Bea Graff.  Prior allergy rx including didn't seem to help   History of Present Illness  01/05/2021  Pulmonary/ 1st office eval/[facility]  No chief complaint on file. Dyspnea:  MMRC2 = can't walk a nl pace on a flat grade s sob but does fine slow and flat Cough: mostly am/ doesn't clear until noon  Sleep: sleep in side  SABA use: not daily   No obvious day to day or daytime variability or assoc excess/ purulent sputum or mucus plugs or hemoptysis or cp or chest tightness, subjective wheeze or overt sinus or hb symptoms.   Sleeping as above  without nocturnal  or early am exacerbation  of respiratory  c/o's or need for noct saba. Also denies any obvious fluctuation of symptoms with weather or environmental changes or other aggravating or alleviating factors except as outlined above   No unusual exposure hx or h/o childhood pna  knowledge of premature birth.  Current Allergies, Complete Past Medical History, Past Surgical History, Family History, and Social History were reviewed in [employer] record.  ROS  The following are not active complaints unless bolded Hoarseness, sore throat, dysphagia, dental problems, itching, sneezing,  nasal congestion or discharge of excess mucus or purulent secretions, ear ache,   fever, chills, sweats, unintended wt loss or wt gain, classically pleuritic or exertional cp,  orthopnea pnd or arm/hand swelling  or leg swelling, presyncope, palpitations, abdominal pain, anorexia, nausea, vomiting, diarrhea   or change in bowel habits or change in bladder habits, change in stools or change in urine, dysuria, hematuria,  rash, arthralgias, visual complaints, headache, numbness, weakness or ataxia or problems with walking or coordination,  change in mood or  memory.           Past Medical History:  Diagnosis Date   ADD (attention deficit disorder)    Anemia    CFS (chronic fatigue syndrome)    Edema of both lower extremities    Fibromyalgia    GAD (generalized anxiety disorder)    GERD (gastroesophageal reflux disease)    History of colitis    History of pulmonary embolus (PE)    per pt in 2019 told probable PE left lung, completed blood thnner treatment,  stated no clot before or since   History of sepsis 11/17/2020   hospital admission, dx acute left pyelonephritis secondary to obstruction uropathy due to left ureter stone   History of sepsis 11/17/2020   hospital admission, dx septic shock , e.coli, acute pyelonephritis due to kidney stone obstruction   IBS (irritable bowel syndrome)    Left ureteral stone    MDD (major depressive disorder)    Mild persistent asthma    Osteoarthritis    RA (rheumatoid arthritis) ([place])    rheumotology--- dr Van Clines   RLS (restless legs syndrome)    Type 2 diabetes mellitus ([place])    followed by pcp  (12-10-2020 pt stated checks daily in am,  fasting sugar-- 120--140)  Urgency of urination    Vitamin B 12 deficiency    Vitamin D deficiency    Wears glasses     Outpatient Medications Prior to Visit  Medication Sig Dispense Refill   albuterol (PROVENTIL HFA;VENTOLIN HFA) 108 (90 BASE) MCG/ACT inhaler Inhale 2 puffs into the lungs every 6 (six) hours as needed. FOR WHEEZING     blood glucose meter kit and supplies KIT Dispense based on patient and insurance preference. Use up to four times daily as directed. 1 each 0   blood glucose meter kit and supplies Dispense based on patient and insurance preference. Use up to four times daily as directed. (FOR  ICD-10 E10.9, E11.9). Test twice daily 1 each 0   Blood Glucose Monitoring Suppl (ACCU-CHEK GUIDE) w/Device KIT USE TO CHECK BLOOD SUGAR UP TO FOUR TIMES A DAY AS  DIRECTED 1 kit 0   buPROPion (WELLBUTRIN SR) 150 MG 12 hr tablet Take 1 tablet (150 mg total) by mouth daily. (Patient taking differently: Take 150 mg by mouth 2 (two) times daily.) 90 tablet 0   cyclobenzaprine (FLEXERIL) 10 MG tablet Take 10 mg by mouth 3 (three) times daily as needed for muscle spasms.     DULoxetine (CYMBALTA) 30 MG capsule Take 90 mg by mouth daily.     Etanercept (ENBREL [place]) Inject into the skin once a week. Inject dose once per week (on Saturdays)     folic acid (FOLVITE) 1 MG tablet Take 1 mg by mouth daily.     furosemide (LASIX) 20 MG tablet Take 20 mg by mouth daily as needed.     gabapentin (NEURONTIN) 300 MG capsule Take 600 mg by mouth 3 (three) times daily. 2 capsules in the AM, 1 capsule at lunch as needed, 2 capsules at bedtime  0   metFORMIN (GLUCOPHAGE-XR) 500 MG 24 hr tablet TAKE 1 TABLET BY MOUTH  TWICE DAILY (Patient taking differently: Take 500 mg by mouth 2 (two) times daily.) 180 tablet 3   montelukast (SINGULAIR) 10 MG tablet Take 10 mg by mouth at bedtime.     Multiple Vitamins-Minerals (HAIR SKIN & NAILS ADVANCED PO) Take 2 tablets by mouth 2 (two) times daily.     mupirocin ointment (BACTROBAN) 2 % Place 1 application into the nose 2 (two) times daily as needed.     omeprazole (PRILOSEC) 20 MG capsule Take 1 capsule (20 mg total) by mouth daily. (Patient taking differently: Take 20 mg by mouth daily.) 30 capsule 0   Vitamin D, Ergocalciferol, (DRISDOL) 1.25 MG (50000 UNIT) CAPS capsule Take 1 capsule (50,000 Units total) by mouth every 7 (seven) days. (Patient taking differently: Take 50,000 Units by mouth every 7 (seven) days. Saturday's) 4 capsule 0   No facility-administered medications prior to visit.     Objective:     BP 136/80   Pulse 85   Temp 97.8 F (36.6 C)   Ht _0   (1.651 m)   Wt 257 lb (116.6 kg)   LMP 05/31/2012   SpO2 99% Comment: ra  BMI 42.77 kg/m   SpO2: 99 % (ra)   HEENT : pt wearing mask not removed for exam due to covid -19 concerns.    NECK :  without JVD/Nodes/TM/ nl carotid upstrokes bilaterally   LUNGS: no acc muscle use,  Nl contour chest which is clear to A and P bilaterally without cough on insp or exp maneuvers   CV:  RRR  no s3 or murmur or increase in P2,  and no edema   ABD:  soft and nontender with nl inspiratory excursion in the supine position. No bruits or organomegaly appreciated, bowel sounds nl  MS:  Nl gait/ ext warm without deformities, calf tenderness, cyanosis or clubbing No obvious joint restrictions   SKIN: warm and dry without lesions    NEURO:  alert, approp, nl sensorium with  no motor or cerebellar deficits apparent.    I personally reviewed images and agree with radiology impression as follows:  CXR:   Portable 11/17/20 1. Small spiculated density in the region of the lingula, not seen on prior studies. Recommend CT chest for further evaluation.   I personally reviewed images and agree with radiology impression as follows:   Chest CT 11/25/20 w contrast: No nodule, fain tree in bud pacities RLL   CXR PA and Lateral:   01/05/2021 :    I personally reviewed images  / impression as follows:    No nodules or dendsities as seen on portable, mild T kyphosis     Assessment   No problem-specific Assessment & Plan notes found for this encounter.     Christinia Gully, MD 01/05/2021

## 2021-01-06 ENCOUNTER — Encounter: Payer: Self-pay | Admitting: Internal Medicine

## 2021-01-06 DIAGNOSIS — R9389 Abnormal findings on diagnostic imaging of other specified body structures: Secondary | ICD-10-CM | POA: Insufficient documentation

## 2021-01-06 DIAGNOSIS — J45991 Cough variant asthma: Secondary | ICD-10-CM | POA: Insufficient documentation

## 2021-01-06 NOTE — Assessment & Plan Note (Addendum)
CT 11/25/20  Tree in bud changes RLL/ minimal   Extremely common finding in this age pt with coincidental RA and of no significance  - in a pt > 15 y out from smoking no benefit to surveillance ct's and would just do PA and Lat  cxr prn which as noted today, is nl (previous cxr of concern was a portable)  Discussed in detail all the  indications, usual  risks and alternatives  relative to the benefits with patient who agrees to proceed with conservative f/u as outlined           Each maintenance medication was reviewed in detail including emphasizing most importantly the difference between maintenance and prns and under what circumstances the prns are to be triggered using an action plan format where appropriate.  Total time for H and P, chart review, counseling, reviewing use of symbicort 80 prn  and generating customized AVS unique to this office visit / same day charting = 35  min

## 2021-01-06 NOTE — Assessment & Plan Note (Addendum)
Onset of globus x 2021 rx with prilosec otc  - 01/05/2021  Add h1 h2 hs   F/u prn with allergy w/u next.

## 2021-01-06 NOTE — Assessment & Plan Note (Signed)
Onset in childhood - added gerd rx and prn 1st gen H1 blockers per guidelines  01/05/2021 >>>  freq use of prednisone and over reliance on saba noted but not not clear now much truly is asthma vs Upper airway cough syndrome (previously labeled PNDS),  is so named because it's frequently impossible to sort out how much is  CR/sinusitis with freq throat clearing (which can be related to primary GERD)   vs  causing  secondary (" extra esophageal")  GERD from wide swings in gastric pressure that occur with throat clearing, often  promoting self use of mint and menthol lozenges that reduce the lower esophageal sphincter tone and exacerbate the problem further in a cyclical fashion.   These are the same pts (now being labeled as having "irritable larynx syndrome" by some cough centers) who not infrequently have a history of having failed to tolerate ace inhibitors,  dry powder inhalers or biphosphonates or report having atypical/extraesophageal reflux symptoms that don't respond to standard doses of PPI  and are easily confused as having aecopd or asthma flares by even experienced allergists/ pulmonologists (myself included).    rec max gerd/ 1st gen H1 blockers per guidelines  And then the next flare try symb 802bid prn  Based on two studies from NEJM  378; 20 p 1865 (2018) and 380 : p2020-30 (2019) in pts with mild asthma it is reasonable to use low dose symbicort eg 80 2bid "prn" flare in this setting but I emphasized this was only shown with symbicort and takes advantage of the rapid onset of action but is not the same as "rescue therapy" but can be stopped once the acute symptoms have resolved and the need for rescue has been minimized (< 2 x weekly)     - The proper method of use, as well as anticipated side effects, of a metered-dose inhaler were discussed and demonstrated to the patient using teach back method.

## 2021-01-07 ENCOUNTER — Encounter: Payer: Self-pay | Admitting: *Deleted

## 2021-01-10 NOTE — Telephone Encounter (Signed)
Dr. Sherene Sires please advise on the following My Chart message:   What is bronchial thickening  ? And what do we do about possible bronchitis ?  Thank you.

## 2021-01-11 NOTE — Progress Notes (Signed)
60 y.o. [id number] Married White or Caucasian female here for annual exam  Was in ICU early May 2022 for urosepsis Has had comprehensive blood work this year Followed up with Teacher, adult education since, everything reported as fine  Is going to Weight Management and has had weight loss recently.  Patient's last menstrual period was 05/31/2012.      No bleeding since Does have some hot flashes but doesn't want any medications.  (Hx PE)   Sexually active: Yes.    The current method of family planning is post menopausal status.    Exercising: Yes.     some Smoker:  no  Health Maintenance: Pap:  11-28-16 neg HPV HR neg History of abnormal Pap:  no MMG:  01-20-20 category a density birads 1:neg, scheduled for 01-20-2021 Colonoscopy:  2014, cologuard neg 2022 BMD:   6/19 f/u 57yr Gardasil:   n/a Covid-19: not done Hep C testing: not done Screening Labs: does not need this year   reports that she quit smoking about 24 years ago. Her smoking use included cigarettes. She has a 12.00 pack-year smoking history. She has never used smokeless tobacco. She reports that she does not drink alcohol and does not use drugs.  Past Medical History:  Diagnosis Date   ADD (attention deficit disorder)    Anemia    CFS (chronic fatigue syndrome)    Edema of both lower extremities    Fibromyalgia    GAD (generalized anxiety disorder)    GERD (gastroesophageal reflux disease)    History of colitis    History of pulmonary embolus (PE)    per pt in 2019 told probable PE left lung, completed blood thnner treatment,  stated no clot before or since   History of sepsis 11/17/2020   hospital admission, dx acute left pyelonephritis secondary to obstruction uropathy due to left ureter stone   History of sepsis 11/17/2020   hospital admission, dx septic shock , e.coli, acute pyelonephritis due to kidney stone obstruction   IBS (irritable bowel syndrome)    Left ureteral stone    MDD (major depressive disorder)     Mild persistent asthma    Osteoarthritis    [place]--- dr aVan Clines  RLS (restless legs syndrome)    Spinal stenosis    [place]    followed by pcp  (12-10-2020 pt stated checks daily in am,  fasting sugar-- 120--140)   Urgency of urination    Vitamin B 12 deficiency    Vitamin D deficiency    Wears glasses     Past Surgical History:  Procedure Laterality Date   COLONOSCOPY  2012   CYSTOSCOPY W/ URETERAL STENT PLACEMENT Left 11/17/2020   Procedure: CYSTOSCOPY LEFT WITH RETROGRADE PYELOGRAM/URETERAL STENT PLACEMENT LEFT DOUBLE J PLACEMENT ;  Surgeon: DFranchot Gallo MD;  [facility]  Service: [facility]rology;  Laterality: Left;   CYSTOSCOPY WITH RETROGRADE PYELOGRAM, URETEROSCOPY AND STENT PLACEMENT Left 12/13/2020   Procedure: CYSTOSCOPY WITH RETROGRADE PYELOGRAM, URETEROSCOPY AND STENT PLACEMENT;  Surgeon: DFranchot Gallo MD;  [facility]  Service: Urology;  Laterality: Left;  75 MINS   EYE SURGERY  chlid   pt stated removal foreign body, unsure which eye   HOLMIUM LASER APPLICATION Left 55/63/8937  Procedure: HOLMIUM LASER APPLICATION;  Surgeon: DFranchot Gallo MD;  [facility]  Service: Urology;  Laterality: Left;   MOUTH SURGERY      Current Outpatient Medications  Medication Sig Dispense Refill   acetaminophen (TYLENOL) 500 MG tablet 2 tablets as needed     albuterol (PROVENTIL HFA;VENTOLIN HFA) 108 (90 BASE) MCG/ACT inhaler Inhale 2 puffs into the lungs every 6 (six) hours as needed. FOR WHEEZING     blood glucose meter kit and supplies Dispense based on patient and insurance preference. Use up to four times daily as directed. (FOR ICD-10 E10.9, E11.9). Test twice daily 1 each 0   Blood Glucose Monitoring Suppl (ACCU-CHEK GUIDE) w/Device KIT USE TO CHECK BLOOD SUGAR UP TO FOUR TIMES A DAY AS  DIRECTED 1 kit 0   budesonide-formoterol (SYMBICORT) 80-4.5 MCG/ACT  inhaler Take 2 puffs first thing in am and then another 2 puffs about 12 hours later. 1 each 11   buPROPion (WELLBUTRIN SR) 150 MG 12 hr tablet Take 1 tablet (150 mg total) by mouth daily. (Patient taking differently: Take 150 mg by mouth 2 (two) times daily.) 90 tablet 0   cyclobenzaprine (FLEXERIL) 10 MG tablet Take 10 mg by mouth 3 (three) times daily as needed for muscle spasms.     DULoxetine (CYMBALTA) 30 MG capsule Take 90 mg by mouth daily.     ENBREL SURECLICK 50 MG/ML injection Inject into the skin.     famotidine (PEPCID) 20 MG tablet One an hour before bed 30 tablet 11   folic acid (FOLVITE) 1 MG tablet Take 1 mg by mouth daily.     furosemide (LASIX) 20 MG tablet Take 20 mg by mouth daily as needed.     gabapentin (NEURONTIN) 300 MG capsule Take 600 mg by mouth 3 (three) times daily. 2 capsules in the AM, 1 capsule at lunch as needed, 2 capsules at bedtime  0   metFORMIN (GLUCOPHAGE-XR) 500 MG 24 hr tablet TAKE 1 TABLET BY MOUTH  TWICE DAILY (Patient taking differently: Take 500 mg by mouth 2 (two) times daily.) 180 tablet 3   montelukast (SINGULAIR) 10 MG tablet Take 10 mg by mouth at bedtime.     mupirocin ointment (BACTROBAN) 2 % Place 1 application into the nose 2 (two) times daily as needed.     omeprazole (PRILOSEC) 40 MG capsule Take 1 capsule (40 mg total) by mouth daily. 30 capsule 2   traMADol (ULTRAM) 50 MG tablet 1 tablet as needed     No current facility-administered medications for this visit.    Family History  Problem Relation Age of Onset   Pneumonia Mother    Asthma Mother    Diabetes Mother    Hypertension Mother    Depression Mother    Anxiety disorder Mother    Eating disorder Mother    Obesity Mother    Sudden death Mother    Cancer Father        Brain tumor   Asthma Father     Review of Systems  Constitutional: Negative.   HENT: Negative.    Eyes: Negative.   Respiratory: Negative.    Cardiovascular: Negative.   Gastrointestinal: Negative.    Endocrine: Negative.   Genitourinary: Negative.   Musculoskeletal: Negative.   Skin: Negative.   Allergic/Immunologic: Negative.   Neurological: Negative.   Hematological: Negative.   Psychiatric/Behavioral: Negative.     Exam:   BP 120/80   Pulse 83   Resp 16   Ht _0  (1.6 m)   Wt 259 lb (117.5 kg)   LMP 05/31/2012   BMI 45.88 kg/m   Height: _1  (160 cm)  General appearance: alert, cooperative  and appears stated age, no acute distress Head: Normocephalic, without obvious abnormality Neck: no adenopathy, thyroid normal to inspection and palpation Lungs: clear to auscultation bilaterally Breasts: normal appearance, no masses or tenderness Heart: regular rate and rhythm Abdomen: soft, non-tender; no masses,  no organomegaly Extremities: extremities normal, no edema Skin: No rashes or lesions Lymph nodes: Cervical, supraclavicular, and axillary nodes normal. No abnormal inguinal nodes palpated Neurologic: Grossly normal   Pelvic: External genitalia:  no lesions              Urethra:  normal appearing urethra with no masses, tenderness or lesions              Bartholins and Skenes: normal                 Vagina: normal appearing vagina, appropriate for age, normal appearing discharge, no lesions              Cervix: neg cervical motion tenderness, no visible lesions             Bimanual Exam:   Uterus:  normal size, contour, position, consistency, mobility, non-tender              Adnexa: no mass, fullness, tenderness               Rectal: no palpable mass  Maudie Mercury, CMA Chaperone was present for exam.  A:  Well Woman with normal exam  P:   Pap :due next year (2023), last done 2018 with neg HPV  Mammogram: due now, schedule end of June  Labs: no new  Medications: no new  Colorectal screening: UTD (2022)  F/U 1 year

## 2021-01-13 ENCOUNTER — Ambulatory Visit (INDEPENDENT_AMBULATORY_CARE_PROVIDER_SITE_OTHER): Payer: Medicare Other | Admitting: Nurse Practitioner

## 2021-01-13 ENCOUNTER — Other Ambulatory Visit: Payer: Self-pay

## 2021-01-13 ENCOUNTER — Encounter: Payer: Medicare Other | Admitting: Obstetrics and Gynecology

## 2021-01-13 ENCOUNTER — Encounter: Payer: Self-pay | Admitting: Nurse Practitioner

## 2021-01-13 VITALS — BP 120/80 | HR 83 | Resp 16 | Ht 63.0 in | Wt 259.0 lb

## 2021-01-13 DIAGNOSIS — Z01419 Encounter for gynecological examination (general) (routine) without abnormal findings: Secondary | ICD-10-CM | POA: Diagnosis not present

## 2021-01-13 NOTE — Patient Instructions (Signed)

## 2021-01-31 ENCOUNTER — Other Ambulatory Visit (INDEPENDENT_AMBULATORY_CARE_PROVIDER_SITE_OTHER): Payer: Self-pay | Admitting: Family Medicine

## 2021-01-31 DIAGNOSIS — F3289 Other specified depressive episodes: Secondary | ICD-10-CM

## 2021-02-01 NOTE — Telephone Encounter (Signed)
Last OV with Dawn 

## 2021-02-03 ENCOUNTER — Encounter (INDEPENDENT_AMBULATORY_CARE_PROVIDER_SITE_OTHER): Payer: Self-pay | Admitting: Family Medicine

## 2021-02-03 ENCOUNTER — Other Ambulatory Visit: Payer: Self-pay

## 2021-02-03 ENCOUNTER — Ambulatory Visit (INDEPENDENT_AMBULATORY_CARE_PROVIDER_SITE_OTHER): Payer: Medicare Other | Admitting: Family Medicine

## 2021-02-03 VITALS — BP 116/73 | HR 77 | Temp 97.8°F | Ht 63.0 in | Wt 261.0 lb

## 2021-02-03 DIAGNOSIS — F3289 Other specified depressive episodes: Secondary | ICD-10-CM

## 2021-02-03 DIAGNOSIS — E1169 Type 2 diabetes mellitus with other specified complication: Secondary | ICD-10-CM | POA: Diagnosis not present

## 2021-02-03 DIAGNOSIS — E559 Vitamin D deficiency, unspecified: Secondary | ICD-10-CM | POA: Diagnosis not present

## 2021-02-03 DIAGNOSIS — Z6841 Body Mass Index (BMI) 40.0 and over, adult: Secondary | ICD-10-CM | POA: Diagnosis not present

## 2021-02-03 MED ORDER — VITAMIN D (ERGOCALCIFEROL) 1.25 MG (50000 UNIT) PO CAPS: 50000.0000 [IU] | ORAL_CAPSULE | ORAL | 0 refills | Status: DC

## 2021-02-03 MED ORDER — BUPROPION HCL ER (SR) 200 MG PO TB12: 200.0000 mg | ORAL_TABLET | Freq: Every day | ORAL | 0 refills | Status: DC

## 2021-02-07 NOTE — Progress Notes (Signed)
Chief Complaint:   OBESITY Sally [name] is here to discuss her progress with her obesity treatment plan along with follow-up of her obesity related diagnoses. Sally [name] is on the Category 3 Plan and states she is following her eating plan approximately 50% of the time. Sally [name] states she is walking 60 minutes 5 times per week.  Today's visit was #: 46 Starting weight: 281 lbs Starting date: 05/14/2018 Today's weight: 261 lbs Today's date: 02/03/2021 Total lbs lost to date: 20 Total lbs lost since last in-office visit: +11  Interim History: Sally [name] has not been on the plan for the last few months due to health issues. She has been getting plenty of protein in. She notes excessive hunger. She is upset about weight gain. Her last OV was 11/30/20.  Subjective:   1. Type 2 diabetes mellitus with other specified complication, without long-term current use of insulin (HCC) Sally [name] has not yet received the Ozempic. She is working on PAP forms. She is on Metformin. Her last A1c was 5.8.  Lab Results  Component Value Date   HGBA1C 5.8 (H) 10/07/2020   HGBA1C 5.8 (H) 01/12/2020   HGBA1C 6.4 (H) 09/02/2019   Lab Results  Component Value Date   LDLCALC 83 09/02/2019   CREATININE 0.70 12/13/2020   Lab Results  Component Value Date   INSULIN 7.5 09/02/2019   INSULIN 4.2 04/17/2019   INSULIN 6.6 10/08/2018   INSULIN 9.8 05/14/2018   2. Vitamin D deficiency Sally [name]'s Vit D is 49.4 (nearly at goal). She has been off of Vit D for a few months.   Lab Results  Component Value Date   VD25OH 49.4 10/07/2020   VD25OH 26.5 (L) 01/12/2020   VD25OH 27.5 (L) 09/02/2019   3. Other depression, with emotional eating Sally [name]'s mood is fairly stable on bupropion 150 mg QD. She notes cravings for sweets.  Assessment/Plan:   1. Type 2 diabetes mellitus with other specified complication, without long-term current use of insulin (HCC) Call Novo Nordisk to check on PAP for Ozempic.  2. Vitamin D  deficiency She agrees to start back taking prescription Vitamin D @50 ,000 IU every week and will follow-up for routine testing of Vitamin D, at least 2-3 times per year to avoid over-replacement. Check Vit D at next OV.  - Vitamin D, Ergocalciferol, (DRISDOL) 1.25 MG (50000 UNIT) CAPS capsule; Take 1 capsule (50,000 Units total) by mouth every 7 (seven) days.  Dispense: 12 capsule; Refill: 0  3. Other depression, with emotional eating Increase bupropion to 200 mg QAM.  Increase- buPROPion (WELLBUTRIN SR) 200 MG 12 hr tablet; Take 1 tablet (200 mg total) by mouth daily with breakfast.  Dispense: 90 tablet; Refill: 0  4. Obesity: Current BMI 46.25  Sally [name] is currently in the action stage of change. As such, her goal is to continue with weight loss efforts. She has agreed to the Category 3 Plan.   Exercise goals:  As is  Behavioral modification strategies: increasing lean protein intake and decreasing simple carbohydrates.  [name] has agreed to follow-up with our clinic in 4 weeks, fasting.  Objective:   Blood pressure 116/73, pulse 77, temperature 97.8 F (36.6 C), height 5\' 3"  (1.6 m), weight 261 lb (118.4 kg), last menstrual period 05/31/2012, SpO2 98 %. Body mass index is 46.23 kg/m.  General: Cooperative, alert, well developed, in no acute distress. HEENT: Conjunctivae and lids unremarkable. Cardiovascular: Regular rhythm.  Lungs: Normal work of breathing. Neurologic: No focal deficits.   Lab Results  Component Value Date   CREATININE 0.70 12/13/2020   BUN 21 12/13/2020   NA 141 12/13/2020   K 4.1 12/13/2020   CL 103 12/13/2020   CO2 28 11/21/2020   Lab Results  Component Value Date   ALT 19 11/17/2020   AST 18 11/17/2020   ALKPHOS 68 11/17/2020   BILITOT 0.9 11/17/2020   Lab Results  Component Value Date   HGBA1C 5.8 (H) 10/07/2020   HGBA1C 5.8 (H) 01/12/2020   HGBA1C 6.4 (H) 09/02/2019   HGBA1C 6.0 (H) 04/17/2019   HGBA1C 6.6 (H) 10/08/2018   Lab Results   Component Value Date   INSULIN 7.5 09/02/2019   INSULIN 4.2 04/17/2019   INSULIN 6.6 10/08/2018   INSULIN 9.8 05/14/2018   Lab Results  Component Value Date   TSH 3.190 09/02/2019   Lab Results  Component Value Date   CHOL 163 09/02/2019   HDL 55 09/02/2019   LDLCALC 83 09/02/2019   TRIG 147 09/02/2019   Lab Results  Component Value Date   VD25OH 49.4 10/07/2020   VD25OH 26.5 (L) 01/12/2020   VD25OH 27.5 (L) 09/02/2019   Lab Results  Component Value Date   WBC 11.4 (H) 11/21/2020   HGB 12.9 12/13/2020   HCT 38.0 12/13/2020   MCV 90.7 11/21/2020   PLT 212 11/21/2020   No results found for: IRON, TIBC, FERRITIN  Obesity Behavioral Intervention:   Approximately 15 minutes were spent on the discussion below.  ASK: We discussed the diagnosis of obesity with Sally [name] today and Sally [name] agreed to give [name] permission to discuss obesity behavioral modification therapy today.  ASSESS: Sally [name] has the diagnosis of obesity and her BMI today is 46.3. Sally [name] is in the action stage of change.   ADVISE: Sally [name] was educated on the multiple health risks of obesity as well as the benefit of weight loss to improve her health. She was advised of the need for long term treatment and the importance of lifestyle modifications to improve her current health and to decrease her risk of future health problems.  AGREE: Multiple dietary modification options and treatment options were discussed and Sally [name] agreed to follow the recommendations documented in the above note.  ARRANGE: Sally [name] was educated on the importance of frequent visits to treat obesity as outlined per CMS and USPSTF guidelines and agreed to schedule her next follow up appointment today.  Attestation Statements:   Reviewed by clinician on day of visit: allergies, medications, problem list, medical history, surgical history, family history, social history, and previous encounter notes.  [name], CMA, am acting as  Energy manager for [place], FNP.  I have reviewed the above documentation for accuracy and completeness, and I agree with the above. -  [name], FNP

## 2021-02-08 ENCOUNTER — Encounter (INDEPENDENT_AMBULATORY_CARE_PROVIDER_SITE_OTHER): Payer: Self-pay | Admitting: Family Medicine

## 2021-02-15 ENCOUNTER — Telehealth: Payer: Self-pay | Admitting: Internal Medicine

## 2021-02-15 MED ORDER — BUDESONIDE-FORMOTEROL FUMARATE 80-4.5 MCG/ACT IN AERO: INHALATION_SPRAY | RESPIRATORY_TRACT | 3 refills | Status: DC

## 2021-02-15 NOTE — Telephone Encounter (Signed)
Symbicort inhaler sent into [facility], 90 day supply with 3 refills. Patient is aware.  Nothing further needed at this time.

## 2021-02-16 ENCOUNTER — Other Ambulatory Visit (INDEPENDENT_AMBULATORY_CARE_PROVIDER_SITE_OTHER): Payer: Self-pay | Admitting: Family Medicine

## 2021-02-16 NOTE — Telephone Encounter (Signed)
LAST APPOINTMENT DATE: 02/03/21 NEXT APPOINTMENT DATE: 03/07/21   [facility] [address], [place] - [address] [place] [address] Phone: [phone] Fax: [phone]  [id number] - [place], [place] - [address] AT [facility] [address] [place] [address] Phone: [phone] Fax: [phone]  [facility] ([facility]) - [place], [place] - [address] [place] [address] Phone: [phone] Fax: [phone]  [facility]  [facility]) - [place], [place] - [address] [place] [address] Phone: [phone] Fax: [phone]  [id number] - [place], [place] - [address] AT [facility] [address] [place] [address] Phone: [phone] Fax: [phone]  Patient is requesting a refill of the following medications: Requested Prescriptions   Pending Prescriptions Disp Refills   ACCU-CHEK GUIDE test strip [Pharmacy Med Name: T STRIP  S ACCU CHEK GUIDE] 200 strip 3    Sig: TEST TWICE DAILY   Accu-Chek Softclix Lancets lancets [Pharmacy Med Name: LANCET   SOFTCLIX] 200 each 3    Sig: CHECK BLOOD GLUCOSE TWICE  DAILY    Date last filled: 12/15/20 Previously prescribed by [facility]  Lab Results  Component Value Date   HGBA1C 5.8 (H) 10/07/2020   HGBA1C 5.8 (H) 01/12/2020   HGBA1C 6.4 (H) 09/02/2019   Lab Results  Component Value Date   LDLCALC 83 09/02/2019   CREATININE 0.70 12/13/2020   Lab Results  Component Value Date   VD25OH 49.4 10/07/2020   VD25OH 26.5 (L) 01/12/2020   VD25OH 27.5 (L) 09/02/2019    BP Readings from Last 3 Encounters:  02/03/21 116/73  01/13/21 120/80  01/05/21 136/80

## 2021-02-16 NOTE — Telephone Encounter (Signed)
Pt last seen by Dawn Whitmire, FNP.  

## 2021-02-17 ENCOUNTER — Other Ambulatory Visit (INDEPENDENT_AMBULATORY_CARE_PROVIDER_SITE_OTHER): Payer: Self-pay | Admitting: Family Medicine

## 2021-02-17 DIAGNOSIS — F3289 Other specified depressive episodes: Secondary | ICD-10-CM

## 2021-02-28 ENCOUNTER — Ambulatory Visit (INDEPENDENT_AMBULATORY_CARE_PROVIDER_SITE_OTHER): Payer: Medicare Other | Admitting: Family Medicine

## 2021-03-07 ENCOUNTER — Ambulatory Visit (INDEPENDENT_AMBULATORY_CARE_PROVIDER_SITE_OTHER): Payer: Medicare Other | Admitting: Family Medicine

## 2021-03-07 ENCOUNTER — Encounter (INDEPENDENT_AMBULATORY_CARE_PROVIDER_SITE_OTHER): Payer: Self-pay | Admitting: Family Medicine

## 2021-03-07 ENCOUNTER — Other Ambulatory Visit: Payer: Self-pay

## 2021-03-07 VITALS — BP 128/75 | HR 82 | Temp 97.7°F | Ht 63.0 in | Wt 267.0 lb

## 2021-03-07 DIAGNOSIS — Z6841 Body Mass Index (BMI) 40.0 and over, adult: Secondary | ICD-10-CM | POA: Diagnosis not present

## 2021-03-07 DIAGNOSIS — E559 Vitamin D deficiency, unspecified: Secondary | ICD-10-CM

## 2021-03-07 DIAGNOSIS — E1169 Type 2 diabetes mellitus with other specified complication: Secondary | ICD-10-CM | POA: Diagnosis not present

## 2021-03-07 DIAGNOSIS — F3289 Other specified depressive episodes: Secondary | ICD-10-CM

## 2021-03-07 DIAGNOSIS — E66813 Obesity, class 3: Secondary | ICD-10-CM

## 2021-03-07 DIAGNOSIS — K5900 Constipation, unspecified: Secondary | ICD-10-CM | POA: Diagnosis not present

## 2021-03-07 MED ORDER — POLYETHYLENE GLYCOL 3350 17 G PO PACK: PACK | ORAL | 0 refills | Status: DC

## 2021-03-07 MED ORDER — LINACLOTIDE 145 MCG PO CAPS: 145.0000 ug | ORAL_CAPSULE | Freq: Every day | ORAL | 0 refills | Status: DC

## 2021-03-08 LAB — HEMOGLOBIN A1C
Est. average glucose Bld gHb Est-mCnc: 134 mg/dL
Hgb A1c MFr Bld: 6.3 % — ABNORMAL HIGH (ref 4.8–5.6)

## 2021-03-08 LAB — VITAMIN D 25 HYDROXY (VIT D DEFICIENCY, FRACTURES): Vit D, 25-Hydroxy: 38.9 ng/mL (ref 30.0–100.0)

## 2021-03-08 NOTE — Progress Notes (Signed)
Chief Complaint:   OBESITY Sally [name] is here to discuss her progress with her obesity treatment plan along with follow-up of her obesity related diagnoses. Sally [name] is on the Category 3 Plan and states she is following her eating plan approximately 50% of the time. Sally [name] states she is swimming 30 minutes 3-6 times per week.  Today's visit was #: 47 Starting weight: 281 lbs Starting date: 05/14/2018 Today's weight: 267 lbs Today's date: 03/07/2021 Total lbs lost to date: 14 Total lbs lost since last in-office visit: +6  Interim History: Sally [name] was recently on steroids, and notes she did not follow plan as closely as she could [name]. She likes her sweets and goes over in snack calories.  Subjective:   1. Type 2 diabetes mellitus with other specified complication, without long-term current use of insulin (HCC) Sally [name] just had her first shot of Ozempic. She is also on Metformin and tolerating both well. Pt denies side effects. She is not checking blood sugars but has supplies.  Lab Results  Component Value Date   HGBA1C 6.3 (H) 03/07/2021   HGBA1C 5.8 (H) 10/07/2020   HGBA1C 5.8 (H) 01/12/2020   Lab Results  Component Value Date   LDLCALC 83 09/02/2019   CREATININE 0.70 12/13/2020   Lab Results  Component Value Date   INSULIN 7.5 09/02/2019   INSULIN 4.2 04/17/2019   INSULIN 6.6 10/08/2018   INSULIN 9.8 05/14/2018   2. Other depression, with emotional eating Sally [name] increased pt's dose of Wellbutrin at her last OV. She is tolerating it well with no issues.  3. Vitamin D deficiency She is currently taking prescription vitamin D 50,000 IU each week. She denies nausea, vomiting or muscle weakness.  Lab Results  Component Value Date   VD25OH 38.9 03/07/2021   VD25OH 49.4 10/07/2020   VD25OH 26.5 (L) 01/12/2020   4. Constipation, unspecified constipation type Sally [name] reports fewer BM's lately. She is not drinking enough water (1/2 of what she needs to be drinking).    Assessment/Plan:   1. Type 2 diabetes mellitus with other specified complication, without long-term current use of insulin (HCC) Continue Ozempic and Metformin. Check blood sugars at home. Check labs today.  - Hemoglobin A1c  2. Other depression, with emotional eating Continue Wellbutrin, exercise, and prudent nutritional plan.  3. Vitamin D deficiency Low Vitamin D level contributes to fatigue and are associated with obesity, breast, and colon cancer. She agrees to continue to take prescription Vitamin D 50,000 IU every week and will follow-up for routine testing of Vitamin D, at least 2-3 times per year to avoid over-replacement. Check labs today.  - VITAMIN D 25 Hydroxy (Vit-D Deficiency, Fractures)  4. Constipation, unspecified constipation type Start OTC Miralax BID until bowel movements are normal, then daily, then PRN. - polyethylene glycol (MIRALAX MIX-IN PAX) 17 g packet; Bid until stooling N, then qd, then prn  Dispense: 14 each; Refill: 0 Start- linaclotide (LINZESS) 145 MCG CAPS capsule; Take 1 capsule (145 mcg total) by mouth daily.  Dispense: 30 capsule; Refill: 0 Adequate hydration  5. Obesity: Current BMI 47.4  Sally [name] is currently in the action stage of change. As such, her goal is to continue with weight loss efforts. She has agreed to the Category 3 Plan.   Exercise goals:  As is  Behavioral modification strategies: meal planning and cooking strategies and planning for success.  Sally [name] has agreed to follow-up with our clinic in 3-4 weeks. She was informed of the importance of frequent  follow-up visits to maximize her success with intensive lifestyle modifications for her multiple health conditions.   Objective:   Blood pressure 128/75, pulse 82, temperature 97.7 F (36.5 C), height 5\' 3"  (1.6 m), weight 267 lb (121.1 kg), last menstrual period 05/31/2012, SpO2 99 %. Body mass index is 47.3 kg/m.  General: Cooperative, alert, well developed, in no acute  distress. HEENT: Conjunctivae and lids unremarkable. Cardiovascular: Regular rhythm.  Lungs: Normal work of breathing. Neurologic: No focal deficits.   Lab Results  Component Value Date   CREATININE 0.70 12/13/2020   BUN 21 12/13/2020   NA 141 12/13/2020   K 4.1 12/13/2020   CL 103 12/13/2020   CO2 28 11/21/2020   Lab Results  Component Value Date   ALT 19 11/17/2020   AST 18 11/17/2020   ALKPHOS 68 11/17/2020   BILITOT 0.9 11/17/2020   Lab Results  Component Value Date   HGBA1C 6.3 (H) 03/07/2021   HGBA1C 5.8 (H) 10/07/2020   HGBA1C 5.8 (H) 01/12/2020   HGBA1C 6.4 (H) 09/02/2019   HGBA1C 6.0 (H) 04/17/2019   Lab Results  Component Value Date   INSULIN 7.5 09/02/2019   INSULIN 4.2 04/17/2019   INSULIN 6.6 10/08/2018   INSULIN 9.8 05/14/2018   Lab Results  Component Value Date   TSH 3.190 09/02/2019   Lab Results  Component Value Date   CHOL 163 09/02/2019   HDL 55 09/02/2019   LDLCALC 83 09/02/2019   TRIG 147 09/02/2019   Lab Results  Component Value Date   VD25OH 38.9 03/07/2021   VD25OH 49.4 10/07/2020   VD25OH 26.5 (L) 01/12/2020   Lab Results  Component Value Date   WBC 11.4 (H) 11/21/2020   HGB 12.9 12/13/2020   HCT 38.0 12/13/2020   MCV 90.7 11/21/2020   PLT 212 11/21/2020   No results found for: IRON, TIBC, FERRITIN  Obesity Behavioral Intervention:   Approximately 15 minutes were spent on the discussion below.  ASK: We discussed the diagnosis of obesity with 11/23/2020 today and Sally [name] agreed to give Sally [name] permission to discuss obesity behavioral modification therapy today.  ASSESS: Sally [name] has the diagnosis of obesity and her BMI today is 47.4. Sally [name] is in the action stage of change.   ADVISE: Sally [name] was educated on the multiple health risks of obesity as well as the benefit of weight loss to improve her health. She was advised of the need for long term treatment and the importance of lifestyle modifications to improve her current health  and to decrease her risk of future health problems.  AGREE: Multiple dietary modification options and treatment options were discussed and Sally [name] agreed to follow the recommendations documented in the above note.  ARRANGE: Sally [name] was educated on the importance of frequent visits to treat obesity as outlined per CMS and USPSTF guidelines and agreed to schedule her next follow up appointment today.  Attestation Statements:   Reviewed by clinician on day of visit: allergies, medications, problem list, medical history, surgical history, family history, social history, and previous encounter notes.  Sally [name], CMA, am acting as transcriptionist for [name], DO.  Sally [name] reviewed the above documentation for accuracy and completeness, and Sally agree with the above. [name], D.O.  The 21st Century Cures Act was signed into law in 2016 which includes the topic of electronic health records.  This provides immediate access to information in MyChart.  This includes consultation notes, operative notes, office notes, lab results and pathology  reports.  If you [name] any questions about what you read please let us know at your next visit so we can discuss your concerns and take corrective action if need be.  We are right here with you.

## 2021-03-24 ENCOUNTER — Encounter (INDEPENDENT_AMBULATORY_CARE_PROVIDER_SITE_OTHER): Payer: Self-pay

## 2021-03-30 ENCOUNTER — Ambulatory Visit (INDEPENDENT_AMBULATORY_CARE_PROVIDER_SITE_OTHER): Payer: Medicare Other | Admitting: Family Medicine

## 2021-03-31 ENCOUNTER — Ambulatory Visit (INDEPENDENT_AMBULATORY_CARE_PROVIDER_SITE_OTHER): Payer: Medicare Other | Admitting: Family Medicine

## 2021-04-02 ENCOUNTER — Other Ambulatory Visit: Payer: Self-pay | Admitting: Internal Medicine

## 2021-04-02 ENCOUNTER — Other Ambulatory Visit (INDEPENDENT_AMBULATORY_CARE_PROVIDER_SITE_OTHER): Payer: Self-pay | Admitting: Family Medicine

## 2021-04-02 DIAGNOSIS — E559 Vitamin D deficiency, unspecified: Secondary | ICD-10-CM

## 2021-04-05 NOTE — Telephone Encounter (Signed)
Dr.Opalski ?

## 2021-04-11 ENCOUNTER — Other Ambulatory Visit: Payer: Self-pay

## 2021-04-11 ENCOUNTER — Encounter (INDEPENDENT_AMBULATORY_CARE_PROVIDER_SITE_OTHER): Payer: Self-pay | Admitting: Physician Assistant

## 2021-04-11 ENCOUNTER — Ambulatory Visit (INDEPENDENT_AMBULATORY_CARE_PROVIDER_SITE_OTHER): Payer: Medicare Other | Admitting: Physician Assistant

## 2021-04-11 VITALS — BP 124/75 | HR 85 | Temp 97.7°F | Ht 63.0 in | Wt 267.0 lb

## 2021-04-11 DIAGNOSIS — F3289 Other specified depressive episodes: Secondary | ICD-10-CM

## 2021-04-11 DIAGNOSIS — E559 Vitamin D deficiency, unspecified: Secondary | ICD-10-CM

## 2021-04-11 DIAGNOSIS — Z6841 Body Mass Index (BMI) 40.0 and over, adult: Secondary | ICD-10-CM

## 2021-04-11 MED ORDER — VITAMIN D (ERGOCALCIFEROL) 1.25 MG (50000 UNIT) PO CAPS: 50000.0000 [IU] | ORAL_CAPSULE | ORAL | 0 refills | Status: DC

## 2021-04-11 MED ORDER — BUPROPION HCL ER (SR) 200 MG PO TB12: 200.0000 mg | ORAL_TABLET | Freq: Every day | ORAL | 0 refills | Status: DC

## 2021-04-11 NOTE — Progress Notes (Signed)
Chief Complaint:   OBESITY Sally [name] is here to discuss her progress with her obesity treatment plan along with follow-up of her obesity related diagnoses. Sally [name] is on the Category 3 Plan and states she is following her eating plan approximately 0% of the time. Tammy states she is not exercising regularly.  Today's visit was #: 48 Starting weight: 281 lbs Starting date: 05/14/2018 Today's weight: 267 lbs Today's date: 04/11/2021 Total lbs lost to date: 14 lbs Total lbs lost since last in-office visit: 0  Interim History: Sally [name] has been under a lot of stress at work.  She reports that she does not have time to prep food.  She may grab a [employer] bar, but not eat a meal.  Subjective:   1. Vitamin D deficiency On vitamin D weekly.  Tolerating well.   Lab Results  Component Value Date   VD25OH 38.9 03/07/2021   VD25OH 49.4 10/07/2020   VD25OH 26.5 (L) 01/12/2020   2. Other depression, with emotional eating Still having some cravings but she is not eating all of the food and skips meals.  On bupropion.  Assessment/Plan:   1. Vitamin D deficiency Low Vitamin D level contributes to fatigue and are associated with obesity, breast, and colon cancer. She agrees to continue to take prescription Vitamin D @50 ,000 IU every week and will follow-up for routine testing of Vitamin D, at least 2-3 times per year to avoid over-replacement.  - Refill Vitamin D, Ergocalciferol, (DRISDOL) 1.25 MG (50000 UNIT) CAPS capsule; Take 1 capsule (50,000 Units total) by mouth every 7 (seven) days.  Dispense: 12 capsule; Refill: 0  2. Other depression, with emotional eating Refill bupropion, as per below.  - Refill buPROPion (WELLBUTRIN SR) 200 MG 12 hr tablet; Take 1 tablet (200 mg total) by mouth daily with breakfast.  Dispense: 90 tablet; Refill: 0  3. Obesity: Current BMI 47.3  Sally [name] is currently in the action stage of change. As such, her goal is to continue with weight loss efforts.  She has agreed to the Category 3 Plan.   Exercise goals: All adults should avoid inactivity. Some physical activity is better than none, and adults who participate in any amount of physical activity gain some health benefits.  Behavioral modification strategies: no skipping meals and meal planning and cooking strategies.  Sally [name] has agreed to follow-up with our clinic in 3-4 weeks. She was informed of the importance of frequent follow-up visits to maximize her success with intensive lifestyle modifications for her multiple health conditions.   Objective:   Blood pressure 124/75, pulse 85, temperature 97.7 F (36.5 C), height 5\' 3"  (1.6 m), weight 267 lb (121.1 kg), last menstrual period 05/31/2012, SpO2 99 %. Body mass index is 47.3 kg/m.  General: Cooperative, alert, well developed, in no acute distress. HEENT: Conjunctivae and lids unremarkable. Cardiovascular: Regular rhythm.  Lungs: Normal work of breathing. Neurologic: No focal deficits.   Lab Results  Component Value Date   CREATININE 0.70 12/13/2020   BUN 21 12/13/2020   NA 141 12/13/2020   K 4.1 12/13/2020   CL 103 12/13/2020   CO2 28 11/21/2020   Lab Results  Component Value Date   ALT 19 11/17/2020   AST 18 11/17/2020   ALKPHOS 68 11/17/2020   BILITOT 0.9 11/17/2020   Lab Results  Component Value Date   HGBA1C 6.3 (H) 03/07/2021   HGBA1C 5.8 (H) 10/07/2020   HGBA1C 5.8 (H) 01/12/2020   HGBA1C 6.4 (H) 09/02/2019  HGBA1C 6.0 (H) 04/17/2019   Lab Results  Component Value Date   INSULIN 7.5 09/02/2019   INSULIN 4.2 04/17/2019   INSULIN 6.6 10/08/2018   INSULIN 9.8 05/14/2018   Lab Results  Component Value Date   TSH 3.190 09/02/2019   Lab Results  Component Value Date   CHOL 163 09/02/2019   HDL 55 09/02/2019   LDLCALC 83 09/02/2019   TRIG 147 09/02/2019   Lab Results  Component Value Date   VD25OH 38.9 03/07/2021   VD25OH 49.4 10/07/2020   VD25OH 26.5 (L) 01/12/2020   Lab Results   Component Value Date   WBC 11.4 (H) 11/21/2020   HGB 12.9 12/13/2020   HCT 38.0 12/13/2020   MCV 90.7 11/21/2020   PLT 212 11/21/2020   Obesity Behavioral Intervention:   Approximately 15 minutes were spent on the discussion below.  ASK: We discussed the diagnosis of obesity with Sally [name] today and Sally [name] agreed to give [name] permission to discuss obesity behavioral modification therapy today.  ASSESS: Sally [name] has the diagnosis of obesity and her BMI today is 47.3. Analina is in the action stage of change.   ADVISE: Sally [name] was educated on the multiple health risks of obesity as well as the benefit of weight loss to improve her health. She was advised of the need for long term treatment and the importance of lifestyle modifications to improve her current health and to decrease her risk of future health problems.  AGREE: Multiple dietary modification options and treatment options were discussed and Sally [name] agreed to follow the recommendations documented in the above note.  ARRANGE: Sally [name] was educated on the importance of frequent visits to treat obesity as outlined per CMS and USPSTF guidelines and agreed to schedule her next follow up appointment today.  Attestation Statements:   Reviewed by clinician on day of visit: allergies, medications, problem list, medical history, surgical history, family history, social history, and previous encounter notes.  I, Insurance claims handler, CMA, am acting as Energy manager for William Hamburger, NP.  I have reviewed the above documentation for accuracy and completeness, and I agree with the above. Alois Cliche, PA-C

## 2021-04-13 ENCOUNTER — Encounter (INDEPENDENT_AMBULATORY_CARE_PROVIDER_SITE_OTHER): Payer: Self-pay

## 2021-04-19 ENCOUNTER — Encounter (INDEPENDENT_AMBULATORY_CARE_PROVIDER_SITE_OTHER): Payer: Self-pay

## 2021-04-20 ENCOUNTER — Other Ambulatory Visit (INDEPENDENT_AMBULATORY_CARE_PROVIDER_SITE_OTHER): Payer: Self-pay | Admitting: Family Medicine

## 2021-04-20 DIAGNOSIS — F3289 Other specified depressive episodes: Secondary | ICD-10-CM

## 2021-04-25 ENCOUNTER — Other Ambulatory Visit: Payer: Self-pay

## 2021-04-25 MED ORDER — OMEPRAZOLE 40 MG PO CPDR: 40.0000 mg | DELAYED_RELEASE_CAPSULE | Freq: Every day | ORAL | 1 refills | Status: DC

## 2021-05-02 ENCOUNTER — Ambulatory Visit (INDEPENDENT_AMBULATORY_CARE_PROVIDER_SITE_OTHER): Payer: Medicare Other | Admitting: Physician Assistant

## 2021-05-11 ENCOUNTER — Other Ambulatory Visit: Payer: Self-pay

## 2021-05-11 ENCOUNTER — Ambulatory Visit (INDEPENDENT_AMBULATORY_CARE_PROVIDER_SITE_OTHER): Payer: Medicare Other | Admitting: Physician Assistant

## 2021-05-11 ENCOUNTER — Encounter (INDEPENDENT_AMBULATORY_CARE_PROVIDER_SITE_OTHER): Payer: Self-pay | Admitting: Physician Assistant

## 2021-05-11 VITALS — BP 115/70 | HR 80 | Temp 98.1°F | Ht 63.0 in | Wt 263.0 lb

## 2021-05-11 DIAGNOSIS — E1169 Type 2 diabetes mellitus with other specified complication: Secondary | ICD-10-CM

## 2021-05-11 DIAGNOSIS — Z6841 Body Mass Index (BMI) 40.0 and over, adult: Secondary | ICD-10-CM | POA: Diagnosis not present

## 2021-05-11 DIAGNOSIS — E559 Vitamin D deficiency, unspecified: Secondary | ICD-10-CM | POA: Diagnosis not present

## 2021-05-11 NOTE — Progress Notes (Signed)
Chief Complaint:   OBESITY Sally [name] is here to discuss her progress with her obesity treatment plan along with follow-up of her obesity related diagnoses. Sally [name] is on the Category 3 Plan and states she is following her eating plan approximately 50% of the time. Sally [name] states she is walking 30 minutes 3 times per week.  Today's visit was #: 49 Starting weight: 281 lbs Starting date: 05/14/2018 Today's weight: 263 lbs Today's date: 05/11/2021 Total lbs lost to date: 18 Total lbs lost since last in-office visit: 4  Interim History: Sally [name] has been drinking more water and eating less sweets. She reports eating more protein during the day.  Subjective:   1. Vitamin D deficiency Sally [name]'s last Vit D was 38.9. She is taking Vitamin D once weekly.  2. Type 2 diabetes mellitus with other specified complication, without long-term current use of insulin (HCC) Pt's last A1c was 6.3. She is on Metformin and denies polyphagia or polyphagia.  Assessment/Plan:   1. Vitamin D deficiency Low Vitamin D level contributes to fatigue and are associated with obesity, breast, and colon cancer. She agrees to continue to take prescription Vitamin D 50,000 IU every week and will follow-up for routine testing of Vitamin D, at least 2-3 times per year to avoid over-replacement.  2. Type 2 diabetes mellitus with other specified complication, without long-term current use of insulin (HCC) Good blood sugar control is important to decrease the likelihood of diabetic complications such as nephropathy, neuropathy, limb loss, blindness, coronary artery disease, and death. Intensive lifestyle modification including diet, exercise and weight loss are the first line of treatment for diabetes. Continue current treatment plan.  3. Obesity: Current BMI 46.6  Sally [name] is currently in the action stage of change. As such, her goal is to continue with weight loss efforts. She has agreed to the Category 3 Plan.   Exercise  goals:  As is  Behavioral modification strategies: increasing lean protein intake, decreasing simple carbohydrates, and meal planning and cooking strategies.  Sally [name] has agreed to follow-up with our clinic in 3-4 weeks. She was informed of the importance of frequent follow-up visits to maximize her success with intensive lifestyle modifications for her multiple health conditions.   Objective:   Blood pressure 115/70, pulse 80, temperature 98.1 F (36.7 C), height 5\' 3"  (1.6 m), weight 263 lb (119.3 kg), last menstrual period 05/31/2012, SpO2 99 %. Body mass index is 46.59 kg/m.  General: Cooperative, alert, well developed, in no acute distress. HEENT: Conjunctivae and lids unremarkable. Cardiovascular: Regular rhythm.  Lungs: Normal work of breathing. Neurologic: No focal deficits.   Lab Results  Component Value Date   CREATININE 0.70 12/13/2020   BUN 21 12/13/2020   NA 141 12/13/2020   K 4.1 12/13/2020   CL 103 12/13/2020   CO2 28 11/21/2020   Lab Results  Component Value Date   ALT 19 11/17/2020   AST 18 11/17/2020   ALKPHOS 68 11/17/2020   BILITOT 0.9 11/17/2020   Lab Results  Component Value Date   HGBA1C 6.3 (H) 03/07/2021   HGBA1C 5.8 (H) 10/07/2020   HGBA1C 5.8 (H) 01/12/2020   HGBA1C 6.4 (H) 09/02/2019   HGBA1C 6.0 (H) 04/17/2019   Lab Results  Component Value Date   INSULIN 7.5 09/02/2019   INSULIN 4.2 04/17/2019   INSULIN 6.6 10/08/2018   INSULIN 9.8 05/14/2018   Lab Results  Component Value Date   TSH 3.190 09/02/2019   Lab Results  Component Value Date  CHOL 163 09/02/2019   HDL 55 09/02/2019   LDLCALC 83 09/02/2019   TRIG 147 09/02/2019   Lab Results  Component Value Date   VD25OH 38.9 03/07/2021   VD25OH 49.4 10/07/2020   VD25OH 26.5 (L) 01/12/2020   Lab Results  Component Value Date   WBC 11.4 (H) 11/21/2020   HGB 12.9 12/13/2020   HCT 38.0 12/13/2020   MCV 90.7 11/21/2020   PLT 212 11/21/2020   No results found for: IRON,  TIBC, FERRITIN  Obesity Behavioral Intervention:   Approximately 15 minutes were spent on the discussion below.  ASK: We discussed the diagnosis of obesity with Sally [name] today and Sally [name] agreed to give [name] permission to discuss obesity behavioral modification therapy today.  ASSESS: Sally [name] has the diagnosis of obesity and her BMI today is 46.7. Sally [name] is in the action stage of change.   ADVISE: Sally [name] was educated on the multiple health risks of obesity as well as the benefit of weight loss to improve her health. She was advised of the need for long term treatment and the importance of lifestyle modifications to improve her current health and to decrease her risk of future health problems.  AGREE: Multiple dietary modification options and treatment options were discussed and Sally [name] agreed to follow the recommendations documented in the above note.  ARRANGE: Sally [name] was educated on the importance of frequent visits to treat obesity as outlined per CMS and USPSTF guidelines and agreed to schedule her next follow up appointment today.  Attestation Statements:   Reviewed by clinician on day of visit: allergies, medications, problem list, medical history, surgical history, family history, social history, and previous encounter notes.  Sally [name], CMA, am acting as transcriptionist for [employer], PA-C.  I have reviewed the above documentation for accuracy and completeness, and I agree with the above. Alois Cliche, PA-C

## 2021-06-01 ENCOUNTER — Ambulatory Visit (INDEPENDENT_AMBULATORY_CARE_PROVIDER_SITE_OTHER): Payer: Medicare Other | Admitting: Physician Assistant

## 2021-06-01 ENCOUNTER — Encounter (INDEPENDENT_AMBULATORY_CARE_PROVIDER_SITE_OTHER): Payer: Self-pay | Admitting: Physician Assistant

## 2021-06-01 ENCOUNTER — Other Ambulatory Visit: Payer: Self-pay

## 2021-06-01 VITALS — BP 125/80 | HR 85 | Temp 97.9°F | Ht 63.0 in | Wt 259.0 lb

## 2021-06-01 DIAGNOSIS — Z6841 Body Mass Index (BMI) 40.0 and over, adult: Secondary | ICD-10-CM

## 2021-06-01 DIAGNOSIS — E559 Vitamin D deficiency, unspecified: Secondary | ICD-10-CM

## 2021-06-01 MED ORDER — VITAMIN D3 125 MCG (5000 UT) PO CAPS: ORAL_CAPSULE | ORAL | Status: DC

## 2021-06-01 NOTE — Progress Notes (Signed)
Chief Complaint:   OBESITY Sally [name] is here to discuss her progress with her obesity treatment plan along with follow-up of her obesity related diagnoses. Sally [name] is on the Category 3 Plan and states she is following her eating plan approximately 65-70% of the time. Sally [name] states she is not currently exercising.  Today's visit was #: 50 Starting weight: 281 lbs Starting date: 05/14/2018 Today's weight: 259 lbs Today's date: 06/01/2021 Total lbs lost to date: 22 Total lbs lost since last in-office visit: 4  Interim History: Sally [name] did well with weight loss. If she eats out, she is making good choices. She is getting in more protein with protein shakes and deli meat.  Subjective:   1. Vitamin D deficiency Sally [name] is not taking Vit D supplement due to pharmacy issues. Her last Vit D level was 38.9, which is not at goal.  Assessment/Plan:   1. Vitamin D deficiency Low Vitamin D level contributes to fatigue and are associated with obesity, breast, and colon cancer. She agrees to continue to change to OTC Vitamin D 5,000 IU, 2 capsules 5 days a week and will follow-up for routine testing of Vitamin D, at least 2-3 times per year to avoid over-replacement.  Start- Cholecalciferol (VITAMIN D3) 125 MCG (5000 UT) CAPS; Take 2 capsules by mouth daily for 5 days of the week OTC  Dispense: 30 capsule  2. Obesity: Current BMI 45.89  Sally [name] is currently in the action stage of change. As such, her goal is to continue with weight loss efforts. She has agreed to the Category 3 Plan.   Check fasting labs at next OV.  Exercise goals:  As is  Behavioral modification strategies: meal planning and cooking strategies and planning for success.  Sally [name] has agreed to follow-up with our clinic in 3 weeks. She was informed of the importance of frequent follow-up visits to maximize her success with intensive lifestyle modifications for her multiple health conditions.   Objective:   Blood pressure  125/80, pulse 85, temperature 97.9 F (36.6 C), height 5\' 3"  (1.6 m), weight 259 lb (117.5 kg), last menstrual period 05/31/2012, SpO2 100 %. Body mass index is 45.88 kg/m.  General: Cooperative, alert, well developed, in no acute distress. HEENT: Conjunctivae and lids unremarkable. Cardiovascular: Regular rhythm.  Lungs: Normal work of breathing. Neurologic: No focal deficits.   Lab Results  Component Value Date   CREATININE 0.70 12/13/2020   BUN 21 12/13/2020   NA 141 12/13/2020   K 4.1 12/13/2020   CL 103 12/13/2020   CO2 28 11/21/2020   Lab Results  Component Value Date   ALT 19 11/17/2020   AST 18 11/17/2020   ALKPHOS 68 11/17/2020   BILITOT 0.9 11/17/2020   Lab Results  Component Value Date   HGBA1C 6.3 (H) 03/07/2021   HGBA1C 5.8 (H) 10/07/2020   HGBA1C 5.8 (H) 01/12/2020   HGBA1C 6.4 (H) 09/02/2019   HGBA1C 6.0 (H) 04/17/2019   Lab Results  Component Value Date   INSULIN 7.5 09/02/2019   INSULIN 4.2 04/17/2019   INSULIN 6.6 10/08/2018   INSULIN 9.8 05/14/2018   Lab Results  Component Value Date   TSH 3.190 09/02/2019   Lab Results  Component Value Date   CHOL 163 09/02/2019   HDL 55 09/02/2019   LDLCALC 83 09/02/2019   TRIG 147 09/02/2019   Lab Results  Component Value Date   VD25OH 38.9 03/07/2021   VD25OH 49.4 10/07/2020   VD25OH 26.5 (L) 01/12/2020  Lab Results  Component Value Date   WBC 11.4 (H) 11/21/2020   HGB 12.9 12/13/2020   HCT 38.0 12/13/2020   MCV 90.7 11/21/2020   PLT 212 11/21/2020   No results found for: IRON, TIBC, FERRITIN  Obesity Behavioral Intervention:   Approximately 15 minutes were spent on the discussion below.  ASK: We discussed the diagnosis of obesity with Sally [name] today and Sally [name] agreed to give [name] permission to discuss obesity behavioral modification therapy today.  ASSESS: Sally [name] has the diagnosis of obesity and her BMI today is 46. Sally [name] is in the action stage of change.   ADVISE: Sally [name] was  educated on the multiple health risks of obesity as well as the benefit of weight loss to improve her health. She was advised of the need for long term treatment and the importance of lifestyle modifications to improve her current health and to decrease her risk of future health problems.  AGREE: Multiple dietary modification options and treatment options were discussed and Sally [name] agreed to follow the recommendations documented in the above note.  ARRANGE: Sally [name] was educated on the importance of frequent visits to treat obesity as outlined per CMS and USPSTF guidelines and agreed to schedule her next follow up appointment today.  Attestation Statements:   Reviewed by clinician on day of visit: allergies, medications, problem list, medical history, surgical history, family history, social history, and previous encounter notes.  Coral Ceo, CMA, am acting as transcriptionist for [employer], PA-C.  I have reviewed the above documentation for accuracy and completeness, and I agree with the above. Abby Potash, PA-C

## 2021-06-29 ENCOUNTER — Ambulatory Visit (INDEPENDENT_AMBULATORY_CARE_PROVIDER_SITE_OTHER): Payer: Medicare Other | Admitting: Physician Assistant

## 2021-06-29 ENCOUNTER — Encounter (INDEPENDENT_AMBULATORY_CARE_PROVIDER_SITE_OTHER): Payer: Self-pay | Admitting: Physician Assistant

## 2021-06-29 ENCOUNTER — Other Ambulatory Visit: Payer: Self-pay

## 2021-06-29 VITALS — BP 137/82 | HR 76 | Temp 97.9°F | Ht 63.0 in | Wt 263.0 lb

## 2021-06-29 DIAGNOSIS — E1169 Type 2 diabetes mellitus with other specified complication: Secondary | ICD-10-CM | POA: Diagnosis not present

## 2021-06-29 DIAGNOSIS — Z6841 Body Mass Index (BMI) 40.0 and over, adult: Secondary | ICD-10-CM

## 2021-06-29 DIAGNOSIS — E7849 Other hyperlipidemia: Secondary | ICD-10-CM | POA: Diagnosis not present

## 2021-06-29 DIAGNOSIS — E559 Vitamin D deficiency, unspecified: Secondary | ICD-10-CM

## 2021-06-29 NOTE — Progress Notes (Signed)
Chief Complaint:   OBESITY Saray is here to discuss her progress with her obesity treatment plan along with follow-up of her obesity related diagnoses. Lillis is on the Category 3 Plan and states she is following her eating plan approximately 50% of the time. Azarya states she is walking 20 minutes 5 times per week.  Today's visit was #: 51 Starting weight: 281 lbs Starting date: 05/14/2018 Today's weight: 263 lbs Today's date: 06/29/2021 Total lbs lost to date: 18 Total lbs lost since last in-office visit: 0  Interim History: Claudeen was on 5 days of prednisone for an asthma exacerbation. She feels swollen today. She overindulged over Thanksgiving.  Subjective:   1. Type 2 diabetes mellitus with other specified complication, without long-term current use of insulin (HCC) Amethyst is on Ozempic 0.5 mg weekly and not controlling appetite. Her last A1c was 6.3.  2. Other hyperlipidemia Komal is not on statin therapy. She denies chest pain.  3. Vitamin D deficiency Pt's last Vit D level was 38.9 and she is on OTC Vit D 10,000 IU 5 days a week.  Assessment/Plan:   1. Type 2 diabetes mellitus with other specified complication, without long-term current use of insulin (HCC) Jayni will increase Ozempic to 1 mg. She will call [facility] to check on PAP for Ozempic. Good blood sugar control is important to decrease the likelihood of diabetic complications such as nephropathy, neuropathy, limb loss, blindness, coronary artery disease, and death. Intensive lifestyle modification including diet, exercise and weight loss are the first line of treatment for diabetes. Check labs today.  - Comprehensive metabolic panel - Hemoglobin A1c - Insulin, random  2. Other hyperlipidemia Cardiovascular risk and specific lipid/LDL goals reviewed.  We discussed several lifestyle modifications today and Chirsty will continue to work on diet, exercise and weight loss efforts. Orders and follow up as  documented in patient record.   Counseling Intensive lifestyle modifications are the first line treatment for this issue. Dietary changes: Increase soluble fiber. Decrease simple carbohydrates. Exercise changes: Moderate to vigorous-intensity aerobic activity 150 minutes per week if tolerated. Lipid-lowering medications: see documented in medical record. Check labs today.  - Lipid panel  3. Vitamin D deficiency Low Vitamin D level contributes to fatigue and are associated with obesity, breast, and colon cancer. She agrees to continue to take OTC Vitamin D 10,000 IU 5 days a week and will follow-up for routine testing of Vitamin D, at least 2-3 times per year to avoid over-replacement. Check labs today.  - VITAMIN D 25 Hydroxy (Vit-D Deficiency, Fractures)  4. Obesity: Current BMI 45.89  Tinzlee is currently in the action stage of change. As such, her goal is to continue with weight loss efforts. She has agreed to the Category 3 Plan.   Exercise goals:  As is  Behavioral modification strategies: meal planning and cooking strategies.  Jashiya has agreed to follow-up with our clinic in 3 weeks. She was informed of the importance of frequent follow-up visits to maximize her success with intensive lifestyle modifications for her multiple health conditions.   Objective:   Blood pressure 137/82, pulse 76, temperature 97.9 F (36.6 C), height 5\' 3"  (1.6 m), weight 263 lb (119.3 kg), last menstrual period 05/31/2012, SpO2 98 %. Body mass index is 46.59 kg/m.  General: Cooperative, alert, well developed, in no acute distress. HEENT: Conjunctivae and lids unremarkable. Cardiovascular: Regular rhythm.  Lungs: Normal work of breathing. Neurologic: No focal deficits.   Lab Results  Component Value Date  CREATININE 0.70 12/13/2020   BUN 21 12/13/2020   NA 141 12/13/2020   K 4.1 12/13/2020   CL 103 12/13/2020   CO2 28 11/21/2020   Lab Results  Component Value Date   ALT 19 11/17/2020    AST 18 11/17/2020   ALKPHOS 68 11/17/2020   BILITOT 0.9 11/17/2020   Lab Results  Component Value Date   HGBA1C 6.3 (H) 03/07/2021   HGBA1C 5.8 (H) 10/07/2020   HGBA1C 5.8 (H) 01/12/2020   HGBA1C 6.4 (H) 09/02/2019   HGBA1C 6.0 (H) 04/17/2019   Lab Results  Component Value Date   INSULIN 7.5 09/02/2019   INSULIN 4.2 04/17/2019   INSULIN 6.6 10/08/2018   INSULIN 9.8 05/14/2018   Lab Results  Component Value Date   TSH 3.190 09/02/2019   Lab Results  Component Value Date   CHOL 163 09/02/2019   HDL 55 09/02/2019   LDLCALC 83 09/02/2019   TRIG 147 09/02/2019   Lab Results  Component Value Date   VD25OH 38.9 03/07/2021   VD25OH 49.4 10/07/2020   VD25OH 26.5 (L) 01/12/2020   Lab Results  Component Value Date   WBC 11.4 (H) 11/21/2020   HGB 12.9 12/13/2020   HCT 38.0 12/13/2020   MCV 90.7 11/21/2020   PLT 212 11/21/2020   No results found for: IRON, TIBC, FERRITIN  Obesity Behavioral Intervention:   Approximately 15 minutes were spent on the discussion below.  ASK: We discussed the diagnosis of obesity with [name] today and Oswin agreed to give [name] permission to discuss obesity behavioral modification therapy today.  ASSESS: Racquel has the diagnosis of obesity and her BMI today is 46.6. Lavola is in the action stage of change.   ADVISE: Contessa was educated on the multiple health risks of obesity as well as the benefit of weight loss to improve her health. She was advised of the need for long term treatment and the importance of lifestyle modifications to improve her current health and to decrease her risk of future health problems.  AGREE: Multiple dietary modification options and treatment options were discussed and Wynn agreed to follow the recommendations documented in the above note.  ARRANGE: Tyshia was educated on the importance of frequent visits to treat obesity as outlined per CMS and USPSTF guidelines and agreed to schedule her next follow up  appointment today.  Attestation Statements:   Reviewed by clinician on day of visit: allergies, medications, problem list, medical history, surgical history, family history, social history, and previous encounter notes.  Coral Ceo, CMA, am acting as transcriptionist for [employer], PA-C.  I have reviewed the above documentation for accuracy and completeness, and I agree with the above. Abby Potash, PA-C

## 2021-06-30 LAB — INSULIN, RANDOM: INSULIN: 6.4 u[IU]/mL (ref 2.6–24.9)

## 2021-06-30 LAB — COMPREHENSIVE METABOLIC PANEL
ALT: 11 IU/L (ref 0–32)
AST: 12 IU/L (ref 0–40)
Albumin/Globulin Ratio: 1.5 (ref 1.2–2.2)
Albumin: 4.5 g/dL (ref 3.8–4.8)
Alkaline Phosphatase: 82 IU/L (ref 44–121)
BUN/Creatinine Ratio: 29 — ABNORMAL HIGH (ref 12–28)
BUN: 22 mg/dL (ref 8–27)
Bilirubin Total: <0.2 mg/dL (ref 0.0–1.2)
CO2: 27 mmol/L (ref 20–29)
Calcium: 9 mg/dL (ref 8.7–10.3)
Chloride: 98 mmol/L (ref 96–106)
Creatinine, Ser: 0.76 mg/dL (ref 0.57–1.00)
Globulin, Total: 3.1 g/dL (ref 1.5–4.5)
Glucose: 90 mg/dL (ref 70–99)
Potassium: 4.8 mmol/L (ref 3.5–5.2)
Sodium: 139 mmol/L (ref 134–144)
Total Protein: 7.6 g/dL (ref 6.0–8.5)
eGFR: 89 mL/min/{1.73_m2} (ref >59)

## 2021-06-30 LAB — LIPID PANEL
Chol/HDL Ratio: 2.8 ratio (ref 0.0–4.4)
Cholesterol, Total: 176 mg/dL (ref 100–199)
HDL: 62 mg/dL (ref >39)
LDL Chol Calc (NIH): 82 mg/dL (ref 0–99)
Triglycerides: 189 mg/dL — ABNORMAL HIGH (ref 0–149)
VLDL Cholesterol Cal: 32 mg/dL (ref 5–40)

## 2021-06-30 LAB — VITAMIN D 25 HYDROXY (VIT D DEFICIENCY, FRACTURES): Vit D, 25-Hydroxy: 31.4 ng/mL (ref 30.0–100.0)

## 2021-06-30 LAB — HEMOGLOBIN A1C
Est. average glucose Bld gHb Est-mCnc: 126 mg/dL
Hgb A1c MFr Bld: 6 % — ABNORMAL HIGH (ref 4.8–5.6)

## 2021-07-04 ENCOUNTER — Other Ambulatory Visit (INDEPENDENT_AMBULATORY_CARE_PROVIDER_SITE_OTHER): Payer: Self-pay | Admitting: Family Medicine

## 2021-07-04 DIAGNOSIS — E1169 Type 2 diabetes mellitus with other specified complication: Secondary | ICD-10-CM

## 2021-07-05 NOTE — Telephone Encounter (Signed)
Mychart message sent.

## 2021-07-11 ENCOUNTER — Ambulatory Visit
Admission: EM | Admit: 2021-07-11 | Discharge: 2021-07-11 | Disposition: A | Discharge status: Home / Self Care | Payer: Medicare Other | Attending: Internal Medicine | Admitting: Internal Medicine

## 2021-07-11 ENCOUNTER — Encounter: Payer: Self-pay | Admitting: Emergency Medicine

## 2021-07-11 DIAGNOSIS — W540XXA Bitten by dog, initial encounter: Secondary | ICD-10-CM

## 2021-07-11 DIAGNOSIS — L03113 Cellulitis of right upper limb: Secondary | ICD-10-CM

## 2021-07-11 MED ORDER — AMOXICILLIN-POT CLAVULANATE 875-125 MG PO TABS: 1.0000 | ORAL_TABLET | Freq: Two times a day (BID) | ORAL | 0 refills | Status: DC

## 2021-07-11 NOTE — Discharge Instructions (Addendum)
You have been prescribed an antibiotic to treat hand infection.  Please call hand specialist as soon as possible for further evaluation and management.

## 2021-07-11 NOTE — ED Provider Notes (Signed)
[facility]    CSN: [id number] Arrival date & time: 07/11/21  1527      History   Chief Complaint Chief Complaint  Patient presents with   Animal Bite    HPI Sally [name] is a 60 y.o. female.   Patient presents with right hand pain and swelling that started approximately 3 days ago after she was bitten by dog.  Patient reports that she tried to break up a dog fight when the dog accidentally bit her.  Dog is not hers but she reports that she has proof that the dog is up-to-date on rabies vaccination.  Patient has had swelling and pain to the right hand.  Denies numbness but has had some mild tingling that she is attributing to the swelling.  Patient reports the swelling has improved since yesterday.  Denies any purulent drainage from the hand.  Denies any fevers.  Patient is able to have full range of motion of hand with limitations due to swelling.  Last tetanus vaccine was approximately 3 years ago.   Animal Bite  Past Medical History:  Diagnosis Date   ADD (attention deficit disorder)    Anemia    CFS (chronic fatigue syndrome)    Edema of both lower extremities    Fibromyalgia    GAD (generalized anxiety disorder)    GERD (gastroesophageal reflux disease)    History of colitis    History of pulmonary embolus (PE)    per pt in 2019 told probable PE left lung, completed blood thnner treatment,  stated no clot before or since   History of sepsis 11/17/2020   hospital admission, dx acute left pyelonephritis secondary to obstruction uropathy due to left ureter stone   History of sepsis 11/17/2020   hospital admission, dx septic shock , e.coli, acute pyelonephritis due to kidney stone obstruction   IBS (irritable bowel syndrome)    Left ureteral stone    MDD (major depressive disorder)    Mild persistent asthma    Osteoarthritis    RA (rheumatoid arthritis) ([place])    rheumotology--- dr Van Clines   RLS (restless legs syndrome)    Spinal stenosis    Type 2  diabetes mellitus ([place])    followed by pcp  (12-10-2020 pt stated checks daily in am,  fasting sugar-- 120--140)   Urgency of urination    Vitamin B 12 deficiency    Vitamin D deficiency    Wears glasses     Patient Active Problem List   Diagnosis Date Noted   Cough variant asthma vs UACS 01/06/2021   Abnormal CT of the chest 01/06/2021   Upper airway cough syndrome 01/05/2021   Sepsis ([place]) 11/17/2020   Elevated blood pressure reading without diagnosis of hypertension 06/24/2020   Depression 04/27/2020   Diabetes mellitus ([place]) 11/10/2019   Class 3 severe obesity with serious comorbidity and body mass index (BMI) of 45.0 to 49.9 in adult ([place]) 10/28/2019   Vitamin D deficiency 10/23/2019   Colitis    RLS (restless legs syndrome)    IBS (irritable bowel syndrome)     Past Surgical History:  Procedure Laterality Date   COLONOSCOPY  2012   CYSTOSCOPY W/ URETERAL STENT PLACEMENT Left 11/17/2020   Procedure: CYSTOSCOPY LEFT WITH RETROGRADE PYELOGRAM/URETERAL STENT PLACEMENT LEFT DOUBLE J PLACEMENT ;  Surgeon: Franchot Gallo, MD;  Location: [facility];  Service: Urology;  Laterality: Left;   CYSTOSCOPY WITH RETROGRADE PYELOGRAM, URETEROSCOPY AND STENT PLACEMENT Left 12/13/2020   Procedure: CYSTOSCOPY WITH RETROGRADE  PYELOGRAM, URETEROSCOPY AND STENT PLACEMENT;  Surgeon: Franchot Gallo, MD;  Location: [facility];  Service: Urology;  Laterality: Left;  75 MINS   EYE SURGERY  chlid   pt stated removal foreign body, unsure which eye   HOLMIUM LASER APPLICATION Left 01/23/6388   Procedure: HOLMIUM LASER APPLICATION;  Surgeon: Franchot Gallo, MD;  Location: [facility];  Service: Urology;  Laterality: Left;   MOUTH SURGERY      OB History     Gravida  6   Para  5   Term  5   Preterm      AB  1   Living  5      SAB      IAB      Ectopic      Multiple      Live Births               Home Medications    Prior to Admission  medications   Medication Sig Start Date End Date Taking? Authorizing Provider  amoxicillin-clavulanate (AUGMENTIN) 875-125 MG tablet Take 1 tablet by mouth every 12 (twelve) hours. 07/11/21  Yes Teodora Medici, FNP  ACCU-CHEK GUIDE test strip TEST TWICE DAILY 02/17/21   [place]mire, Joneen Boers, FNP  Accu-Chek Softclix Lancets lancets CHECK BLOOD GLUCOSE TWICE  DAILY 02/17/21   Whitmire, Joneen Boers, FNP  acetaminophen (TYLENOL) 500 MG tablet 2 tablets as needed    [provider]  albuterol (PROVENTIL HFA;VENTOLIN HFA) 108 (90 BASE) MCG/ACT inhaler Inhale 2 puffs into the lungs every 6 (six) hours as needed. FOR WHEEZING    [provider]  blood glucose meter kit and supplies Dispense based on patient and insurance preference. Use up to four times daily as directed. (FOR ICD-10 E10.9, E11.9). Test twice daily 05/27/18   Dennard Nip D, MD  Blood Glucose Monitoring Suppl (ACCU-CHEK GUIDE) w/Device KIT USE TO CHECK BLOOD SUGAR UP TO FOUR TIMES A DAY AS  DIRECTED 12/06/20   Whitmire, Joneen Boers, FNP  budesonide-formoterol (SYMBICORT) 80-4.5 MCG/ACT inhaler Take 2 puffs first thing in am and then another 2 puffs about 12 hours later. 02/15/21   Tanda Rockers, MD  buPROPion (WELLBUTRIN SR) 200 MG 12 hr tablet Take 1 tablet (200 mg total) by mouth daily with breakfast. 04/11/21   Abby Potash, PA-C  Cholecalciferol (VITAMIN D3) 125 MCG (5000 UT) CAPS Take 2 capsules by mouth daily for 5 days of the week OTC 06/01/21   Abby Potash, PA-C  cyclobenzaprine (FLEXERIL) 10 MG tablet Take 10 mg by mouth 3 (three) times daily as needed for muscle spasms.    [provider]  DULoxetine (CYMBALTA) 30 MG capsule Take 90 mg by mouth daily. 10/31/17   [provider]  ENBREL SURECLICK 50 MG/ML injection Inject into the skin. 08/05/20   [provider]  famotidine (PEPCID) 20 MG tablet One an hour before bed 01/05/21   Tanda Rockers, MD  folic acid (FOLVITE) 1 MG tablet Take 1 mg by mouth  daily.    [provider]  furosemide (LASIX) 20 MG tablet Take 20 mg by mouth daily as needed.    [provider]  gabapentin (NEURONTIN) 300 MG capsule Take 600 mg by mouth 3 (three) times daily. 2 capsules in the AM, 1 capsule at lunch as needed, 2 capsules at bedtime 10/22/17   [provider]  metFORMIN (GLUCOPHAGE-XR) 500 MG 24 hr tablet TAKE 1 TABLET BY MOUTH  TWICE  DAILY Patient taking differently: Take 500 mg by mouth 2 (two) times daily. 08/02/20   Whitmire, Dawn W, FNP  montelukast (SINGULAIR) 10 MG tablet Take 10 mg by mouth at bedtime.    [provider]  mupirocin ointment (BACTROBAN) 2 % Place 1 application into the nose 2 (two) times daily as needed.    [provider]  omeprazole (PRILOSEC) 40 MG capsule Take 1 capsule (40 mg total) by mouth daily. 04/25/21   Tanda Rockers, MD  polyethylene glycol (MIRALAX MIX-IN PAX) 17 g packet Bid until stooling N, then qd, then prn 03/07/21   Opalski, Neoma Laming, DO  traMADol (ULTRAM) 50 MG tablet 1 tablet as needed    [provider]    Family History Family History  Problem Relation Age of Onset   Pneumonia Mother    Asthma Mother    Diabetes Mother    Hypertension Mother    Depression Mother    Anxiety disorder Mother    Eating disorder Mother    Obesity Mother    Sudden death Mother    Cancer Father        Brain tumor   Asthma Father     Social History Social History   Tobacco Use   Smoking status: Former    Packs/day: 1.50    Years: 8.00    Pack years: 12.00    Types: Cigarettes    Quit date: 12/10/1996    Years since quitting: 24.6   Smokeless tobacco: Never  Vaping Use   Vaping Use: Never used  Substance Use Topics   Alcohol use: No    Alcohol/week: 0.0 standard drinks   Drug use: No     Allergies   Patient has no known allergies.   Review of Systems Review of Systems Per HPI  Physical Exam Triage Vital Signs ED Triage Vitals  Enc Vitals Group      BP 07/11/21 1647 136/79     Pulse Rate 07/11/21 1647 83     Resp 07/11/21 1647 16     Temp 07/11/21 1647 98 F (36.7 C)     Temp Source 07/11/21 1647 Oral     SpO2 07/11/21 1647 98 %     Weight 07/11/21 1649 260 lb (117.9 kg)     Height --      Head Circumference --      Peak Flow --      Pain Score 07/11/21 1647 3     Pain Loc --      Pain Edu? --      Excl. in [place]? --    No data found.  Updated Vital Signs BP 136/79 (BP Location: Left Arm)   Pulse 83   Temp 98 F (36.7 C) (Oral)   Resp 16   Wt 260 lb (117.9 kg)   LMP 05/31/2012   SpO2 98%   BMI 46.06 kg/m   Visual Acuity Right Eye Distance:   Left Eye Distance:   Bilateral Distance:    Right Eye Near:   Left Eye Near:    Bilateral Near:     Physical Exam Constitutional:      General: She is not in acute distress.    Appearance: Normal appearance. She is not toxic-appearing or diaphoretic.  HENT:     Head: Normocephalic and atraumatic.  Eyes:     Extraocular Movements: Extraocular movements intact.     Conjunctiva/sclera: Conjunctivae normal.  Pulmonary:     Effort: Pulmonary effort is normal.  Musculoskeletal:     Right hand: Swelling and tenderness present. No bony tenderness. Normal strength. Normal sensation. Normal capillary refill. Normal pulse.     Left hand: Normal.       Hands:     Comments: Patient has severe swelling to dorsal surface of right hand that extends into fingers.  Associated erythema to dorsal surface of right hand that extends into right wrist and surface of right forearm.  Patient has white discoloration to dorsal surface of right fingers but capillary refill and pulses are intact.  Patient has full range of motion with slight limitation due to amount of swelling. 2 puncture wounds noted with 1 being on the mid dorsal surface of right hand and the second being on the palmar surface of the right hand.  Wound edges are approximated with puncture wounds.  No drainage noted.  Neurological:      General: No focal deficit present.     Mental Status: She is alert and oriented to person, place, and time. Mental status is at baseline.  Psychiatric:        Mood and Affect: Mood normal.        Behavior: Behavior normal.        Thought Content: Thought content normal.        Judgment: Judgment normal.     UC Treatments / Results  Labs (all labs ordered are listed, but only abnormal results are displayed) Labs Reviewed - No data to display  EKG   Radiology No results found.  Procedures Procedures (including critical care time)  Medications Ordered in UC Medications - No data to display  Initial Impression / Assessment and Plan / UC Course  I have reviewed the triage vital signs and the nursing notes.  Pertinent labs & imaging results that were available during my care of the patient were reviewed by me and considered in my medical decision making (see chart for details).     Skin of right hand appears to be infected.  Patient does have a white discoloration of the right fingers which is concerning but patient is neurovascularly intact.  Suggested further evaluation and management at the hospital given this discoloration and extent of swelling to rule out complications such as osteomyelitis but patient declined.  Risks associated with not going to hospital were discussed with patient.  Will prescribe Augmentin antibiotic with strict return precautions.  Patient will also need to follow-up with hand specialist due to significant swelling. Provided patient with this contact information.  Advised patient to go to the hospital if no improvement in symptoms in the next 48 hours.  Patient verbalized understanding and was agreeable with plan. Final Clinical Impressions(s) / UC Diagnoses   Final diagnoses:  Dog bite, initial encounter  Cellulitis of right hand     Discharge Instructions      You have been prescribed an antibiotic to treat hand infection.  Please call hand  specialist as soon as possible for further evaluation and management.    ED Prescriptions     Medication Sig Dispense Auth. Provider   amoxicillin-clavulanate (AUGMENTIN) 875-125 MG tablet Take 1 tablet by mouth every 12 (twelve) hours. 14 tablet [place], Michele Rockers, [place]      PDMP not reviewed this encounter.   Teodora Medici, [place] 07/11/21 1800

## 2021-07-11 NOTE — ED Triage Notes (Signed)
Was breaking up a dog fight Saturday, got her right hand bit. States she's up to date on her tetanus shot, also reports she has proof of the dogs rabies vaccination. Two open puncture wounds, one on the posterior and one on the anterior aspects of hand, oozing blood. Right hand visibly swollen, red, tender. States the swelling has improved over the last couple days. Redness appears to be moving up into the right mid forearm.

## 2021-08-10 ENCOUNTER — Ambulatory Visit (INDEPENDENT_AMBULATORY_CARE_PROVIDER_SITE_OTHER): Payer: Medicare Other | Admitting: Family Medicine

## 2021-08-17 ENCOUNTER — Other Ambulatory Visit: Payer: Self-pay

## 2021-08-17 ENCOUNTER — Ambulatory Visit (INDEPENDENT_AMBULATORY_CARE_PROVIDER_SITE_OTHER): Payer: Medicare Other | Admitting: Family Medicine

## 2021-08-17 ENCOUNTER — Encounter (INDEPENDENT_AMBULATORY_CARE_PROVIDER_SITE_OTHER): Payer: Self-pay | Admitting: Family Medicine

## 2021-08-17 VITALS — BP 128/76 | HR 88 | Temp 98.0°F | Ht 63.0 in | Wt 252.0 lb

## 2021-08-17 DIAGNOSIS — E1169 Type 2 diabetes mellitus with other specified complication: Secondary | ICD-10-CM | POA: Diagnosis not present

## 2021-08-17 DIAGNOSIS — Z6841 Body Mass Index (BMI) 40.0 and over, adult: Secondary | ICD-10-CM

## 2021-08-17 DIAGNOSIS — E559 Vitamin D deficiency, unspecified: Secondary | ICD-10-CM | POA: Diagnosis not present

## 2021-08-17 MED ORDER — VITAMIN D (ERGOCALCIFEROL) 1.25 MG (50000 UNIT) PO CAPS: 50000.0000 [IU] | ORAL_CAPSULE | ORAL | 0 refills | Status: DC

## 2021-08-17 NOTE — Progress Notes (Signed)
Chief Complaint:   OBESITY Sally [name] is here to discuss her progress with her obesity treatment plan along with follow-up of her obesity related diagnoses. Sally [name] is on the Category 3 Plan and states she is following her eating plan approximately 60-70% of the time. Sally [name] states she is walking at work for 15 minutes 5 times per week.  Today's visit was #: 23 Starting weight: 281 lbs Starting date: 05/14/2018 Today's weight: 252 lbs Today's date: 08/17/2021 Total lbs lost to date: 29 lbs Total lbs lost since last in-office visit: 11 lbs  Interim History: Sally [name] has been adhering to plan very well over the holidays. She is down 11 lbs today. She notes protein intake is good. She has soda a few times per week.  Subjective:   1. Type 2 diabetes mellitus with other specified complication, without long-term current use of insulin (HCC) Sally [name]'s Type 2 diabetes mellitus is well controlled. She is taking Ozempic 0.50 mg weekly. Her last A1C was 6.0, down from 6.3. She does not check CBGs regularly.   Lab Results  Component Value Date   HGBA1C 6.0 (H) 06/29/2021   HGBA1C 6.3 (H) 03/07/2021   HGBA1C 5.8 (H) 10/07/2020   Lab Results  Component Value Date   LDLCALC 82 06/29/2021   CREATININE 0.76 06/29/2021   Lab Results  Component Value Date   INSULIN 6.4 06/29/2021   INSULIN 7.5 09/02/2019   INSULIN 4.2 04/17/2019   INSULIN 6.6 10/08/2018   INSULIN 9.8 05/14/2018    2. Vitamin D deficiency Sally [name]'s Vitamin D is not at goal (31.4). She has been off of supplementation.   Lab Results  Component Value Date   VD25OH 31.4 06/29/2021   VD25OH 38.9 03/07/2021   VD25OH 49.4 10/07/2020    Assessment/Plan:   1. Type 2 diabetes mellitus with other specified complication, without long-term current use of insulin (HCC) Sally [name] will talk to primary care physician about  increasing dose to 1 mg. She gets Ozempic through PAP (managed by primary care physician).   2. Vitamin D  deficiency Sally [name] agrees to start new prescription Vitamin D 50,000 IU every week and she will follow-up for routine testing of Vitamin D, at least 2-3 times per year to avoid over-replacement.  - Vitamin D, Ergocalciferol, (DRISDOL) 1.25 MG (50000 UNIT) CAPS capsule; Take 1 capsule (50,000 Units total) by mouth every 7 (seven) days.  Dispense: 12 capsule; Refill: 0  3. Obesity: Current BMI 44.65 Sally [name] is currently in the action stage of change. As such, her goal is to continue with weight loss efforts. She has agreed to the Category 3 Plan.   Exercise goals:  As is.  Behavioral modification strategies: increasing lean protein intake and decreasing simple carbohydrates.  Sally [name] has agreed to follow-up with our clinic in 3 weeks.  Objective:   Blood pressure 128/76, pulse 88, temperature 98 F (36.7 C), height 5\' 3"  (1.6 m), weight 252 lb (114.3 kg), last menstrual period 05/31/2012, SpO2 99 %. Body mass index is 44.64 kg/m.  General: Cooperative, alert, well developed, in no acute distress. HEENT: Conjunctivae and lids unremarkable. Cardiovascular: Regular rhythm.  Lungs: Normal work of breathing. Neurologic: No focal deficits.   Lab Results  Component Value Date   CREATININE 0.76 06/29/2021   BUN 22 06/29/2021   NA 139 06/29/2021   K 4.8 06/29/2021   CL 98 06/29/2021   CO2 27 06/29/2021   Lab Results  Component Value Date   ALT 11 06/29/2021   AST  12 06/29/2021   ALKPHOS 82 06/29/2021   BILITOT <0.2 06/29/2021   Lab Results  Component Value Date   HGBA1C 6.0 (H) 06/29/2021   HGBA1C 6.3 (H) 03/07/2021   HGBA1C 5.8 (H) 10/07/2020   HGBA1C 5.8 (H) 01/12/2020   HGBA1C 6.4 (H) 09/02/2019   Lab Results  Component Value Date   INSULIN 6.4 06/29/2021   INSULIN 7.5 09/02/2019   INSULIN 4.2 04/17/2019   INSULIN 6.6 10/08/2018   INSULIN 9.8 05/14/2018   Lab Results  Component Value Date   TSH 3.190 09/02/2019   Lab Results  Component Value Date   CHOL 176  06/29/2021   HDL 62 06/29/2021   LDLCALC 82 06/29/2021   TRIG 189 (H) 06/29/2021   CHOLHDL 2.8 06/29/2021   Lab Results  Component Value Date   VD25OH 31.4 06/29/2021   VD25OH 38.9 03/07/2021   VD25OH 49.4 10/07/2020   Lab Results  Component Value Date   WBC 11.4 (H) 11/21/2020   HGB 12.9 12/13/2020   HCT 38.0 12/13/2020   MCV 90.7 11/21/2020   PLT 212 11/21/2020   No results found for: IRON, TIBC, FERRITIN  Attestation Statements:   Reviewed by clinician on day of visit: allergies, medications, problem list, medical history, surgical history, family history, social history, and previous encounter notes.  I, Lizbeth Bark, RMA, am acting as Location manager for [employer], [place].  I have reviewed the above documentation for accuracy and completeness, and I agree with the above. -  Georgianne Fick, FNP

## 2021-09-12 ENCOUNTER — Ambulatory Visit (INDEPENDENT_AMBULATORY_CARE_PROVIDER_SITE_OTHER): Payer: Medicare Other | Admitting: Physician Assistant

## 2021-09-12 ENCOUNTER — Telehealth: Payer: Self-pay | Admitting: Internal Medicine

## 2021-09-13 NOTE — Telephone Encounter (Signed)
I called and left a message for this patient that per Dr. Melvyn Novas she would need to call her PCP for this refill. Closing encounter.

## 2021-10-12 ENCOUNTER — Ambulatory Visit (INDEPENDENT_AMBULATORY_CARE_PROVIDER_SITE_OTHER): Payer: Medicare Other | Admitting: Physician Assistant

## 2021-10-25 ENCOUNTER — Other Ambulatory Visit: Payer: Self-pay

## 2021-10-25 ENCOUNTER — Ambulatory Visit (INDEPENDENT_AMBULATORY_CARE_PROVIDER_SITE_OTHER): Payer: Medicare Other | Admitting: Nurse Practitioner

## 2021-10-25 ENCOUNTER — Encounter (INDEPENDENT_AMBULATORY_CARE_PROVIDER_SITE_OTHER): Payer: Self-pay | Admitting: Nurse Practitioner

## 2021-10-25 VITALS — BP 130/73 | HR 82 | Temp 97.8°F | Ht 63.0 in | Wt 260.0 lb

## 2021-10-25 DIAGNOSIS — E538 Deficiency of other specified B group vitamins: Secondary | ICD-10-CM

## 2021-10-25 DIAGNOSIS — Z6841 Body Mass Index (BMI) 40.0 and over, adult: Secondary | ICD-10-CM | POA: Diagnosis not present

## 2021-10-25 DIAGNOSIS — E559 Vitamin D deficiency, unspecified: Secondary | ICD-10-CM | POA: Diagnosis not present

## 2021-10-25 DIAGNOSIS — F3289 Other specified depressive episodes: Secondary | ICD-10-CM

## 2021-10-25 DIAGNOSIS — E669 Obesity, unspecified: Secondary | ICD-10-CM | POA: Diagnosis not present

## 2021-10-25 DIAGNOSIS — Z7985 Long-term (current) use of injectable non-insulin antidiabetic drugs: Secondary | ICD-10-CM | POA: Diagnosis not present

## 2021-10-25 DIAGNOSIS — E1169 Type 2 diabetes mellitus with other specified complication: Secondary | ICD-10-CM | POA: Diagnosis not present

## 2021-10-25 MED ORDER — VITAMIN D (ERGOCALCIFEROL) 1.25 MG (50000 UNIT) PO CAPS: 50000.0000 [IU] | ORAL_CAPSULE | ORAL | 0 refills | Status: DC

## 2021-10-26 LAB — HEMOGLOBIN A1C
Est. average glucose Bld gHb Est-mCnc: 123 mg/dL
Hgb A1c MFr Bld: 5.9 % — ABNORMAL HIGH (ref 4.8–5.6)

## 2021-10-26 LAB — COMPREHENSIVE METABOLIC PANEL
ALT: 13 IU/L (ref 0–32)
AST: 16 IU/L (ref 0–40)
Albumin/Globulin Ratio: 1.5 (ref 1.2–2.2)
Albumin: 4.4 g/dL (ref 3.8–4.8)
Alkaline Phosphatase: 85 IU/L (ref 44–121)
BUN/Creatinine Ratio: 24 (ref 12–28)
BUN: 18 mg/dL (ref 8–27)
Bilirubin Total: 0.3 mg/dL (ref 0.0–1.2)
CO2: 25 mmol/L (ref 20–29)
Calcium: 9.4 mg/dL (ref 8.7–10.3)
Chloride: 101 mmol/L (ref 96–106)
Creatinine, Ser: 0.76 mg/dL (ref 0.57–1.00)
Globulin, Total: 2.9 g/dL (ref 1.5–4.5)
Glucose: 97 mg/dL (ref 70–99)
Potassium: 4.8 mmol/L (ref 3.5–5.2)
Sodium: 139 mmol/L (ref 134–144)
Total Protein: 7.3 g/dL (ref 6.0–8.5)
eGFR: 89 mL/min/{1.73_m2} (ref >59)

## 2021-10-26 LAB — INSULIN, RANDOM: INSULIN: 7.5 u[IU]/mL (ref 2.6–24.9)

## 2021-10-26 LAB — VITAMIN D 25 HYDROXY (VIT D DEFICIENCY, FRACTURES): Vit D, 25-Hydroxy: 43.3 ng/mL (ref 30.0–100.0)

## 2021-10-26 NOTE — Progress Notes (Signed)
? ? ? ?Chief Complaint:  ? ?OBESITY ?Sally [name] is here to discuss her progress with her obesity treatment plan along with follow-up of her obesity related diagnoses. Sally [name] is on the Category 3 Plan and states she is following her eating plan approximately 40-50% of the time. Sally [name] states she is walking for 20 minutes 2-3 times per week. ? ?Today's visit was #: 58 ?Starting weight: 281 lbs ?Starting date: 05/14/2018 ?Today's weight: 260 lbs ?Today's date: 10/25/2021 ?Total lbs lost to date: 21 lbs ?Total lbs lost since last in-office visit: 0 ? ?Interim History: Sally [name] has gotten off track since her last visit. She is drinking more sodas. She has taken prednisone a couple times since her last visit and feels has contributed to her weight gain and hunger.  ? ?Subjective:  ? ?1. Type 2 diabetes mellitus with other specified complication, without long-term current use of insulin ([place]) ?Sally [name]'s last A1C was 6.0. She is taking Metformin XR 500 mg twice daily and Ozempic 1 mg started this week. She denies side effects. Her fasting blood sugar was in the range of 116-150s. She is not on a statin or ACE.  ? ?2. Vitamin D deficiency ?Sally [name] is currently taking Vitamin D 50,000 IU weekly. She denies side effects nausea, vomiting or muscle weakness.  ? ?3. Vitamin B12 deficiency ?Sally [name]'s last Vitamin B 12 was 293. She is taking Vitamin B 12 injections monthly. She denies side effects.  ? ?Assessment/Plan:  ? ?1. Type 2 diabetes mellitus with other specified complication, without long-term current use of insulin ([place]) ?Jadelyn will continue to follow up with her primary care physician. She will continue medications as directed. Labs were obtained today. Good blood sugar control is important to decrease the likelihood of diabetic complications such as nephropathy, neuropathy, limb loss, blindness, coronary artery disease, and death. Intensive lifestyle modification including diet, exercise and weight loss are the first line of  treatment for diabetes.  ? ?- Comprehensive metabolic panel ?- Hemoglobin A1c ?- Insulin, random ? ?2. Vitamin D deficiency ?Low Vitamin D level contributes to fatigue and are associated with obesity, breast, and colon cancer. We will refill prescription Vitamin D 50,000 IU every week for 3 months with no refills and Sally [name] will follow-up for routine testing of Vitamin D, at least 2-3 times per year to avoid over-replacement.Labs were obtained today.  ? ?- Vitamin D, Ergocalciferol, (DRISDOL) 1.25 MG (50000 UNIT) CAPS capsule; Take 1 capsule (50,000 Units total) by mouth every 7 (seven) days.  Dispense: 12 capsule; Refill: 0 ?- Comprehensive metabolic panel ?- VITAMIN D 25 Hydroxy (Vit-D Deficiency, Fractures) ? ?3. Vitamin B12 deficiency ?The diagnosis was reviewed with the patient. Sally [name] will continue to follow up with her primary care physician. She will continue medications as directed. Counseling provided today, see below. We will continue to monitor. Orders and follow up as documented in patient record. ? ?Counseling ?The body needs vitamin B12: to make red blood cells; to make DNA; and to help the nerves work properly so they can carry messages from the brain to the body.  ?The main causes of vitamin B12 deficiency include dietary deficiency, digestive diseases, pernicious anemia, and having a surgery in which part of the stomach or small intestine is removed.  ?Certain medicines can make it harder for the body to absorb vitamin B12. These medicines include: heartburn medications; some antibiotics; some medications used to treat diabetes, gout, and high cholesterol.  ?In some cases, there are no symptoms of this condition. If the  condition leads to anemia or nerve damage, various symptoms can occur, such as weakness or fatigue, shortness of breath, and numbness or tingling in your hands and feet.   ?Treatment:  ?May include taking vitamin B12 supplements.  ?Avoid alcohol.  ?Eat lots of healthy foods that  contain vitamin B12: ?Beef, pork, chicken, [place], and organ meats, such as liver.  ?Seafood: This includes clams, rainbow trout, salmon, tuna, and haddock. Eggs.  ?Cereal and dairy products that are fortified: This means that vitamin B12 has been added to the food.   ? ?- Comprehensive metabolic panel ? ?4. Obesity: Current BMI 46.1 ?Sally [name] is currently in the action stage of change. As such, her goal is to continue with weight loss efforts. She has agreed to the Category 3 Plan.  ? ?Sally [name] was given handouts on Category 3.  ? ?Exercise goals:  As is. ? ?Behavioral modification strategies: increasing lean protein intake, increasing water intake, no skipping meals, and meal planning and cooking strategies. ? ?Sally [name] has agreed to follow-up with our clinic in 2 weeks with Sally [name], [facility]. She was informed of the importance of frequent follow-up visits to maximize her success with intensive lifestyle modifications for her multiple health conditions.  ? ?Sally [name] was informed we would discuss her lab results at her next visit unless there is a critical issue that needs to be addressed sooner. Sally [name] agreed to keep her next visit at the agreed upon time to discuss these results. ? ?Objective:  ? ?Blood pressure 130/73, pulse 82, temperature 97.8 ?F (36.6 ?C), height 5\' 3"  (1.6 m), weight 260 lb (117.9 kg), last menstrual period 05/31/2012, SpO2 99 %. ?Body mass index is 46.06 kg/m?. ? ?General: Cooperative, alert, well developed, in no acute distress. ?HEENT: Conjunctivae and lids unremarkable. ?Cardiovascular: Regular rhythm.  ?Lungs: Normal work of breathing. ?Neurologic: No focal deficits.  ? ?Lab Results  ?Component Value Date  ? CREATININE 0.76 06/29/2021  ? BUN 22 06/29/2021  ? NA 139 06/29/2021  ? K 4.8 06/29/2021  ? CL 98 06/29/2021  ? CO2 27 06/29/2021  ? ?Lab Results  ?Component Value Date  ? ALT 11 06/29/2021  ? AST 12 06/29/2021  ? ALKPHOS 82 06/29/2021  ? BILITOT <0.2 06/29/2021  ? ?Lab Results  ?Component  Value Date  ? HGBA1C 6.0 (H) 06/29/2021  ? HGBA1C 6.3 (H) 03/07/2021  ? HGBA1C 5.8 (H) 10/07/2020  ? HGBA1C 5.8 (H) 01/12/2020  ? HGBA1C 6.4 (H) 09/02/2019  ? ?Lab Results  ?Component Value Date  ? INSULIN 6.4 06/29/2021  ? INSULIN 7.5 09/02/2019  ? INSULIN 4.2 04/17/2019  ? INSULIN 6.6 10/08/2018  ? INSULIN 9.8 05/14/2018  ? ?Lab Results  ?Component Value Date  ? TSH 3.190 09/02/2019  ? ?Lab Results  ?Component Value Date  ? CHOL 176 06/29/2021  ? HDL 62 06/29/2021  ? [place] 82 06/29/2021  ? TRIG 189 (H) 06/29/2021  ? CHOLHDL 2.8 06/29/2021  ? ?Lab Results  ?Component Value Date  ? VD25OH 31.4 06/29/2021  ? VD25OH 38.9 03/07/2021  ? VD25OH 49.4 10/07/2020  ? ?Lab Results  ?Component Value Date  ? WBC 11.4 (H) 11/21/2020  ? HGB 12.9 12/13/2020  ? HCT 38.0 12/13/2020  ? MCV 90.7 11/21/2020  ? PLT 212 11/21/2020  ? ?No results found for: IRON, TIBC, FERRITIN ? ?Obesity Behavioral Intervention:  ? ?Approximately 15 minutes were spent on the discussion below. ? ?ASK: ?We discussed the diagnosis of obesity with Sally [name] today and Sally [name] agreed to give [name]  permission to discuss obesity behavioral modification therapy today. ? ?ASSESS: ?Sally [name] has the diagnosis of obesity and her BMI today is 46.1. Sally [name] is in the action stage of change.  ? ?ADVISE: ?Sally [name] was educated on the multiple health risks of obesity as well as the benefit of weight loss to improve her health. She was advised of the need for long term treatment and the importance of lifestyle modifications to improve her current health and to decrease her risk of future health problems. ? ?AGREE: ?Multiple dietary modification options and treatment options were discussed and Sally [name] agreed to follow the recommendations documented in the above note. ? ?ARRANGE: ?Yamna was educated on the importance of frequent visits to treat obesity as outlined per CMS and USPSTF guidelines and agreed to schedule her next follow up appointment today. ? ?Attestation Statements:   ? ?Reviewed by clinician on day of visit: allergies, medications, problem list, medical history, surgical history, family history, social history, and previous encounter notes. ? ?I, Lizbeth Bark, RMA, am act

## 2021-11-07 NOTE — Progress Notes (Signed)
?          ?TeleHealth Visit:  ?Due to the COVID-19 pandemic, this visit was completed with telemedicine (audio/video) technology to reduce patient and provider exposure as well as to preserve personal protective equipment.  ? ?Sally [name] has verbally consented to this TeleHealth visit. The patient is located at home, the provider is located at home. The participants in this visit include the listed provider and patient. The visit was conducted today via MyChart video. ? ?OBESITY ?[name] is here to discuss her progress with her obesity treatment plan along with follow-up of her obesity related diagnoses.  ? ?Today's visit was # 59 ?Starting weight: 281 lbs ?Starting date: 05/14/18 ?Total weight loss: 21 lbs at last in office visit on 10/25/21. ?Weight at last in office visit: 260 lbs on 10/25/21 ?Today's reported weight: 250 lbs at home. ? ?Nutrition Plan: the Category 3 Plan 50-60% of time. ?Hunger is well controlled. Cravings are moderately controlled.  ?Current exercise: walking before she had pneumonia ? ?Interim History: Sally [name] had pneumonia last week and had very little appetite.  According to her home scale she is down 10 pounds.   ?She says that she tries to focus on having protein but overall has not adhered to the plan well.  She has occasional serving of juice.  She usually takes her lunch to work or goes home to eat. ?Sally [name] had been considering bariatric surgery last year but then had an obstructive kidney stone and became septic.  She was hospitalized for about a week.  She says that since then she is concerned about the risks of having surgery and being hospitalized. ? ?Assessment/Plan:  ?Type II Diabetes ?HgbA1c is at goal. ?CBGs: Not checking ?Any episodes of hypoglycemia? no ?Medication(s): Ozempic 0.5 mg weekly, metformin 500 mg twice daily.  Hunger pretty well controlled with the Ozempic.  She gets the Ozempic through a manufacturer assistance program. ? ?Lab Results  ?Component Value Date  ? HGBA1C 5.9  (H) 10/25/2021  ? HGBA1C 6.0 (H) 06/29/2021  ? HGBA1C 6.3 (H) 03/07/2021  ? ?Lab Results  ?Component Value Date  ? [place] 82 06/29/2021  ? CREATININE 0.76 10/25/2021  ? ? ?Plan: ?Continue both Ozempic and metformin at current doses. ? ?Obesity: Current BMI 46.07 ?Sally [name] is currently in the action stage of change. As such, her goal is to continue with weight loss efforts.  ?She has agreed to the Category 3 Plan.  ? ?Exercise goals:  Walking as tolerated. ? ?Behavioral modification strategies: increasing lean protein intake, decreasing simple carbohydrates, and decreasing liquid calories. ? ?Sally [name] has agreed to follow-up with our clinic in 4 weeks.  ? ?No orders of the defined types were placed in this encounter. ? ? ?There are no discontinued medications.  ? ?No orders of the defined types were placed in this encounter. ?   ? ?Objective:  ? ?VITALS: Per patient if applicable, see vitals. ?GENERAL: Alert and in no acute distress. ?CARDIOPULMONARY: No increased WOB. Speaking in clear sentences.  ?PSYCH: Pleasant and cooperative. Speech normal rate and rhythm. Affect is appropriate. Insight and judgement are appropriate. Attention is focused, linear, and appropriate.  ?NEURO: Oriented as arrived to appointment on time with no prompting.  ? ?Lab Results  ?Component Value Date  ? CREATININE 0.76 10/25/2021  ? BUN 18 10/25/2021  ? NA 139 10/25/2021  ? K 4.8 10/25/2021  ? CL 101 10/25/2021  ? CO2 25 10/25/2021  ? ?Lab Results  ?Component Value Date  ? ALT 13  10/25/2021  ? AST 16 10/25/2021  ? ALKPHOS 85 10/25/2021  ? BILITOT 0.3 10/25/2021  ? ?Lab Results  ?Component Value Date  ? HGBA1C 5.9 (H) 10/25/2021  ? HGBA1C 6.0 (H) 06/29/2021  ? HGBA1C 6.3 (H) 03/07/2021  ? HGBA1C 5.8 (H) 10/07/2020  ? HGBA1C 5.8 (H) 01/12/2020  ? ?Lab Results  ?Component Value Date  ? INSULIN 7.5 10/25/2021  ? INSULIN 6.4 06/29/2021  ? INSULIN 7.5 09/02/2019  ? INSULIN 4.2 04/17/2019  ? INSULIN 6.6 10/08/2018  ? ?Lab Results  ?Component Value  Date  ? TSH 3.190 09/02/2019  ? ?Lab Results  ?Component Value Date  ? CHOL 176 06/29/2021  ? HDL 62 06/29/2021  ? [place] 82 06/29/2021  ? TRIG 189 (H) 06/29/2021  ? CHOLHDL 2.8 06/29/2021  ? ?Lab Results  ?Component Value Date  ? WBC 11.4 (H) 11/21/2020  ? HGB 12.9 12/13/2020  ? HCT 38.0 12/13/2020  ? MCV 90.7 11/21/2020  ? PLT 212 11/21/2020  ? ?No results found for: IRON, TIBC, FERRITIN ?Lab Results  ?Component Value Date  ? VD25OH 43.3 10/25/2021  ? VD25OH 31.4 06/29/2021  ? VD25OH 38.9 03/07/2021  ? ? ?Attestation Statements:  ? ?Reviewed by clinician on day of visit: allergies, medications, problem list, medical history, surgical history, family history, social history, and previous encounter notes. ? ?

## 2021-11-08 ENCOUNTER — Encounter (INDEPENDENT_AMBULATORY_CARE_PROVIDER_SITE_OTHER): Payer: Self-pay | Admitting: Family Medicine

## 2021-11-08 ENCOUNTER — Telehealth (INDEPENDENT_AMBULATORY_CARE_PROVIDER_SITE_OTHER): Payer: Medicare Other | Admitting: Family Medicine

## 2021-11-08 DIAGNOSIS — E669 Obesity, unspecified: Secondary | ICD-10-CM

## 2021-11-08 DIAGNOSIS — Z7985 Long-term (current) use of injectable non-insulin antidiabetic drugs: Secondary | ICD-10-CM | POA: Diagnosis not present

## 2021-11-08 DIAGNOSIS — Z6841 Body Mass Index (BMI) 40.0 and over, adult: Secondary | ICD-10-CM

## 2021-11-08 DIAGNOSIS — E1169 Type 2 diabetes mellitus with other specified complication: Secondary | ICD-10-CM

## 2021-11-26 ENCOUNTER — Other Ambulatory Visit: Payer: Self-pay | Admitting: Internal Medicine

## 2021-12-05 ENCOUNTER — Ambulatory Visit (INDEPENDENT_AMBULATORY_CARE_PROVIDER_SITE_OTHER): Payer: Medicare Other | Admitting: Physician Assistant

## 2021-12-20 ENCOUNTER — Ambulatory Visit (INDEPENDENT_AMBULATORY_CARE_PROVIDER_SITE_OTHER): Payer: Medicare Other | Admitting: Family Medicine

## 2021-12-20 ENCOUNTER — Encounter (INDEPENDENT_AMBULATORY_CARE_PROVIDER_SITE_OTHER): Payer: Self-pay | Admitting: Family Medicine

## 2021-12-20 VITALS — BP 121/77 | HR 85 | Temp 97.8°F | Ht 63.0 in | Wt 272.0 lb

## 2021-12-20 DIAGNOSIS — F3289 Other specified depressive episodes: Secondary | ICD-10-CM | POA: Diagnosis not present

## 2021-12-20 DIAGNOSIS — Z7984 Long term (current) use of oral hypoglycemic drugs: Secondary | ICD-10-CM

## 2021-12-20 DIAGNOSIS — E1165 Type 2 diabetes mellitus with hyperglycemia: Secondary | ICD-10-CM

## 2021-12-20 DIAGNOSIS — Z6841 Body Mass Index (BMI) 40.0 and over, adult: Secondary | ICD-10-CM

## 2021-12-20 DIAGNOSIS — E669 Obesity, unspecified: Secondary | ICD-10-CM

## 2021-12-20 DIAGNOSIS — Z7985 Long-term (current) use of injectable non-insulin antidiabetic drugs: Secondary | ICD-10-CM

## 2021-12-28 NOTE — Progress Notes (Signed)
Chief Complaint:   OBESITY Sally [name] is here to discuss her progress with her obesity treatment plan along with follow-up of her obesity related diagnoses. Sally [name] is on the Category 3 Plan and states she is following her eating plan approximately 50% of the time. Sally [name] states she is not exercising.  Today's visit was #: 5 Starting weight: 281 lbs Starting date: 05/14/2018 Today's weight: 272 lbs Today's date: 12/20/2021 Total lbs lost to date: 9 lbs Total lbs lost since last in-office visit: 0  Interim History: Sally [name] had pneumonia over the last few weeks and was on prednisone.  Afterwards while recovering, she felt very hungry.  Wants to get back to Category 3 to get back on track.  Needs to restock at grocery store.  Having indulgent food in house is not an obstacle.   Subjective:   1. Type 2 diabetes mellitus with hyperglycemia, without long-term current use of insulin ([facility]) Sally [name]'s last A1c was in March of 5.9.  She is currently on Metform and Ozempic.   2. Other depression, with emotional eating Sally [name] is doing well on Wellbutrin with some craving control.   Assessment/Plan:   1. Type 2 diabetes mellitus with hyperglycemia, without long-term current use of insulin ([facility]) Continue current medication regimen, no change in dosing.   2. Other depression, with emotional eating Continue and refill Wellbutrin.  3. Obesity with current BMI of 48.2 Sally [name] is currently in the action stage of change. As such, her goal is to continue with weight loss efforts. She has agreed to the Category 3 Plan.   Exercise goals:  As is.   Behavioral modification strategies: increasing lean protein intake, meal planning and cooking strategies, keeping healthy foods in the home, and planning for success.  Sally [name] has agreed to follow-up with our clinic in 4 weeks. She was informed of the importance of frequent follow-up visits to maximize her success with intensive lifestyle modifications for  her multiple health conditions.   Objective:   Blood pressure 121/77, pulse 85, temperature 97.8 F (36.6 C), height 5\' 3"  (1.6 m), weight 272 lb (123.4 kg), last menstrual period 05/31/2012, SpO2 99 %. Body mass index is 48.18 kg/m.  General: Cooperative, alert, well developed, in no acute distress. HEENT: Conjunctivae and lids unremarkable. Cardiovascular: Regular rhythm.  Lungs: Normal work of breathing. Neurologic: No focal deficits.   Lab Results  Component Value Date   CREATININE 0.76 10/25/2021   BUN 18 10/25/2021   NA 139 10/25/2021   K 4.8 10/25/2021   CL 101 10/25/2021   CO2 25 10/25/2021   Lab Results  Component Value Date   ALT 13 10/25/2021   AST 16 10/25/2021   ALKPHOS 85 10/25/2021   BILITOT 0.3 10/25/2021   Lab Results  Component Value Date   HGBA1C 5.9 (H) 10/25/2021   HGBA1C 6.0 (H) 06/29/2021   HGBA1C 6.3 (H) 03/07/2021   HGBA1C 5.8 (H) 10/07/2020   HGBA1C 5.8 (H) 01/12/2020   Lab Results  Component Value Date   INSULIN 7.5 10/25/2021   INSULIN 6.4 06/29/2021   INSULIN 7.5 09/02/2019   INSULIN 4.2 04/17/2019   INSULIN 6.6 10/08/2018   Lab Results  Component Value Date   TSH 3.190 09/02/2019   Lab Results  Component Value Date   CHOL 176 06/29/2021   HDL 62 06/29/2021   LDLCALC 82 06/29/2021   TRIG 189 (H) 06/29/2021   CHOLHDL 2.8 06/29/2021   Lab Results  Component Value Date   VD25OH 43.3 10/25/2021  VD25OH 31.4 06/29/2021   VD25OH 38.9 03/07/2021   Lab Results  Component Value Date   WBC 11.4 (H) 11/21/2020   HGB 12.9 12/13/2020   HCT 38.0 12/13/2020   MCV 90.7 11/21/2020   PLT 212 11/21/2020   No results found for: IRON, TIBC, FERRITIN  Obesity Behavioral Intervention:   Approximately 15 minutes were spent on the discussion below.  ASK: We discussed the diagnosis of obesity with Sally [name] today and Sally [name] agreed to give [name] permission to discuss obesity behavioral modification therapy today.  ASSESS: Sally [name] has the  diagnosis of obesity and her BMI today is 48.2. Grettel is in the action stage of change.   ADVISE: Sally [name] was educated on the multiple health risks of obesity as well as the benefit of weight loss to improve her health. She was advised of the need for long term treatment and the importance of lifestyle modifications to improve her current health and to decrease her risk of future health problems.  AGREE: Multiple dietary modification options and treatment options were discussed and Sally [name] agreed to follow the recommendations documented in the above note.  ARRANGE: Sally [name] was educated on the importance of frequent visits to treat obesity as outlined per CMS and USPSTF guidelines and agreed to schedule her next follow up appointment today.  Attestation Statements:   Reviewed by clinician on day of visit: allergies, medications, problem list, medical history, surgical history, family history, social history, and previous encounter notes.  I, Davy Pique, RMA, am acting as transcriptionist for Coralie Common, MD.  I have reviewed the above documentation for accuracy and completeness, and I agree with the above. - Coralie Common, MD

## 2022-01-10 ENCOUNTER — Emergency Department (HOSPITAL_COMMUNITY)
Admission: EM | Admit: 2022-01-10 | Discharge: 2022-01-11 | Disposition: A | Discharge status: Home / Self Care | Payer: Medicare Other | Attending: Emergency Medicine | Admitting: Emergency Medicine

## 2022-01-10 ENCOUNTER — Other Ambulatory Visit: Payer: Self-pay

## 2022-01-10 ENCOUNTER — Encounter (HOSPITAL_COMMUNITY): Payer: Self-pay | Admitting: Emergency Medicine

## 2022-01-10 ENCOUNTER — Emergency Department (HOSPITAL_COMMUNITY): Payer: Medicare Other

## 2022-01-10 DIAGNOSIS — E119 Type 2 diabetes mellitus without complications: Secondary | ICD-10-CM | POA: Insufficient documentation

## 2022-01-10 DIAGNOSIS — Y9301 Activity, walking, marching and hiking: Secondary | ICD-10-CM | POA: Diagnosis not present

## 2022-01-10 DIAGNOSIS — S42292A Other displaced fracture of upper end of left humerus, initial encounter for closed fracture: Secondary | ICD-10-CM | POA: Diagnosis not present

## 2022-01-10 DIAGNOSIS — S4992XA Unspecified injury of left shoulder and upper arm, initial encounter: Secondary | ICD-10-CM | POA: Diagnosis present

## 2022-01-10 DIAGNOSIS — W109XXA Fall (on) (from) unspecified stairs and steps, initial encounter: Secondary | ICD-10-CM | POA: Insufficient documentation

## 2022-01-10 MED ORDER — HYDROMORPHONE HCL 1 MG/ML IJ SOLN
1.0000 mg | Freq: Once | INTRAMUSCULAR | Status: AC
  Administered 2022-01-10: 1 mg via INTRAMUSCULAR
  Filled 2022-01-10: qty 1

## 2022-01-10 MED ORDER — OXYCODONE-ACETAMINOPHEN 5-325 MG PO TABS: 1.0000 | ORAL_TABLET | ORAL | 0 refills | Status: DC | PRN

## 2022-01-10 MED ORDER — OXYCODONE-ACETAMINOPHEN 5-325 MG PO TABS
1.0000 | ORAL_TABLET | Freq: Once | ORAL | Status: AC
  Administered 2022-01-10: 1 via ORAL
  Filled 2022-01-10: qty 1

## 2022-01-10 NOTE — ED Triage Notes (Signed)
Pt arrives POV after tripping and falling while walking out of back door and presents with L should pain. Patient placed in a sling. Mechanical fall, didn't hit head, denies blood thinner. AOX4.

## 2022-01-10 NOTE — ED Provider Triage Note (Signed)
Emergency Medicine Provider Triage Evaluation Note  Sally [name] , a 61 y.o. female  was evaluated in triage.  Pt complains of left arm and shoulder pain.  Patient states that she tripped while getting out her back door and fell directly onto her left shoulder and left arm.  EMS came out, but she decided to drive herself to the ER.  Denies head trauma or loss of consciousness.  She is not on chronic anticoagulation.  Review of Systems  Positive: As above Negative: Syncope, head trauma  Physical Exam  BP (!) 146/85 (BP Location: Right Arm)   Pulse 90   Temp 97.9 F (36.6 C) (Oral)   Resp 18   Ht 5\' 4"  (1.626 m)   Wt 123 kg   LMP 05/31/2012   SpO2 97%   BMI 46.55 kg/m  Gen:   Awake, no distress   Resp:  Normal effort  MSK:   Moves extremities without difficulty  Other:  Normal grip strength, sensation intact in bilateral upper extremities, with good radial pulse.  No obvious deformity to palpation of the left shoulder and left humerus, but significant tenderness to palpation over both areas.  Medical Decision Making  Medically screening exam initiated at 4:10 PM.  Appropriate orders placed.  Sally [name] was informed that the remainder of the evaluation will be completed by another provider, this initial triage assessment does not replace that evaluation, and the importance of remaining in the ED until their evaluation is complete.  Will obtain imaging to r/o fracture or dislocation   Baylor Cortez T, PA-C 01/10/22 1611

## 2022-01-10 NOTE — Discharge Instructions (Addendum)
Take the prescribed medication as directed.  Can take motrin between dosing for better pain control.  Stay in sling for now. Follow-up with Dr. Aundria Rud-- call in the morning to get appt scheduled. Return to the ED for new or worsening symptoms.

## 2022-01-10 NOTE — ED Provider Notes (Signed)
[facility] EMERGENCY DEPARTMENT Provider Note   CSN: [id number] Arrival date & time: 01/10/22  1526     History {Add pertinent medical, surgical, social history, OB history to HPI:1} Chief Complaint  Patient presents with   Fall   Shoulder Injury    Sally [name] is a 61 y.o. female.  The history is provided by the patient and medical records.  Fall  Shoulder Injury   61 year old female with history of depression, diabetes, IBS, RLS, presenting to the ED with left shoulder injury.  States she was walking out of her door sideways when she missed a step and fell with impact on left shoulder.  There was no head injury or loss of consciousness.  She had immediate onset of pain to the left arm and inability to move left arm without pain.  She denies any numbness or tingling of left arm.  She has not had any prior injuries or surgeries to this area.  She is followed by [facility].  Home Medications Prior to Admission medications   Medication Sig Start Date End Date Taking? Authorizing Provider  ACCU-CHEK GUIDE test strip TEST TWICE DAILY 02/17/21   Whitmire, Joneen Boers, FNP  Accu-Chek Softclix Lancets lancets CHECK BLOOD GLUCOSE TWICE  DAILY 02/17/21   Whitmire, Joneen Boers, FNP  acetaminophen (TYLENOL) 500 MG tablet 2 tablets as needed    [provider]  albuterol (PROVENTIL HFA;VENTOLIN HFA) 108 (90 BASE) MCG/ACT inhaler Inhale 2 puffs into the lungs every 6 (six) hours as needed. FOR WHEEZING    [provider]  blood glucose meter kit and supplies Dispense based on patient and insurance preference. Use up to four times daily as directed. (FOR ICD-10 E10.9, E11.9). Test twice daily 05/27/18   Dennard Nip D, MD  Blood Glucose Monitoring Suppl (ACCU-CHEK GUIDE) w/Device KIT USE TO CHECK BLOOD SUGAR UP TO FOUR TIMES A DAY AS  DIRECTED 12/06/20   Whitmire, Joneen Boers, FNP  budesonide-formoterol (SYMBICORT) 80-4.5 MCG/ACT inhaler Take 2 puffs first thing in am  and then another 2 puffs about 12 hours later. 02/15/21   Tanda Rockers, MD  buPROPion (WELLBUTRIN SR) 200 MG 12 hr tablet Take 1 tablet (200 mg total) by mouth daily with breakfast. 04/11/21   Abby Potash, PA-C  buPROPion (WELLBUTRIN XL) 150 MG 24 hr tablet Take 150 mg by mouth every morning. 08/12/21   [provider]  cyanocobalamin (,VITAMIN B-12,) 1000 MCG/ML injection Inject into the muscle. 08/01/21   [provider]  DULoxetine (CYMBALTA) 30 MG capsule Take 90 mg by mouth daily. 10/31/17   [provider]  ENBREL SURECLICK 50 MG/ML injection Inject into the skin. 08/05/20   [provider]  famotidine (PEPCID) 20 MG tablet TAKE 1 TABLET BY MOUTH 1  HOUR BEFORE BEDTIME 11/28/21   Tanda Rockers, MD  fluconazole (DIFLUCAN) 100 MG tablet Take 100 mg by mouth daily. 07/15/21   [provider]  folic acid (FOLVITE) 1 MG tablet Take 1 mg by mouth daily.    [provider]  furosemide (LASIX) 20 MG tablet Take 20 mg by mouth daily as needed.    [provider]  gabapentin (NEURONTIN) 300 MG capsule Take 600 mg by mouth 3 (three) times daily. 2 capsules in the AM, 1 capsule at lunch as needed, 2 capsules at bedtime 10/22/17   [provider]  metFORMIN (GLUCOPHAGE-XR) 500 MG 24 hr tablet TAKE 1 TABLET BY MOUTH  TWICE DAILY Patient taking differently:  Take 500 mg by mouth 2 (two) times daily. 08/02/20   Whitmire, Dawn W, FNP  montelukast (SINGULAIR) 10 MG tablet Take 10 mg by mouth at bedtime.    [provider]  mupirocin ointment (BACTROBAN) 2 % Place 1 application into the nose 2 (two) times daily as needed.    [provider]  omeprazole (PRILOSEC) 40 MG capsule Take 1 capsule (40 mg total) by mouth daily. 04/25/21   Tanda Rockers, MD  polyethylene glycol (MIRALAX MIX-IN PAX) 17 g packet Bid until stooling N, then qd, then prn 03/07/21   Opalski, Deborah, DO  Semaglutide (OZEMPIC, 0.25 OR 0.5 MG/DOSE, [facility]) Inject 0.5  mg into the skin once a week.    [provider]  traMADol (ULTRAM) 50 MG tablet 1 tablet as needed    [provider]  Vitamin D, Ergocalciferol, (DRISDOL) 1.25 MG (50000 UNIT) CAPS capsule Take 1 capsule (50,000 Units total) by mouth every 7 (seven) days. 10/25/21   Everardo Pacific, [employer]      Allergies    Patient has no known allergies.    Review of Systems   Review of Systems  Musculoskeletal:  Positive for arthralgias.  All other systems reviewed and are negative.   Physical Exam Updated Vital Signs BP (!) 159/89   Pulse 72   Temp 97.9 F (36.6 C) (Oral)   Resp 15   Ht 5' 4"  (1.626 m)   Wt 123 kg   LMP 05/31/2012   SpO2 100%   BMI 46.55 kg/m   Physical Exam Vitals and nursing note reviewed.  Constitutional:      Appearance: She is well-developed.  HENT:     Head: Normocephalic and atraumatic.  Eyes:     Conjunctiva/sclera: Conjunctivae normal.     Pupils: Pupils are equal, round, and reactive to light.  Cardiovascular:     Rate and Rhythm: Normal rate and regular rhythm.     Heart sounds: Normal heart sounds.  Pulmonary:     Effort: Pulmonary effort is normal. No respiratory distress.     Breath sounds: Normal breath sounds. No rhonchi.  Abdominal:     General: Bowel sounds are normal.     Palpations: Abdomen is soft.     Tenderness: There is no abdominal tenderness. There is no rebound.  Musculoskeletal:        General: Normal range of motion.     Cervical back: Normal range of motion.     Comments: Left shoulder in sling, significant tenderness with palpation to left shoulder and proximal humerus, there does appear to be some swelling but no bruising or skin tenting, radial pulse intact, moving fingers normally, normal grip  Skin:    General: Skin is warm and dry.  Neurological:     Mental Status: She is alert and oriented to person, place, and time.     ED Results / Procedures / Treatments   Labs (all labs ordered are listed,  but only abnormal results are displayed) Labs Reviewed - No data to display  EKG None  Radiology DG Shoulder Left Port  Result Date: 01/10/2022 CLINICAL DATA:  Fall EXAM: LEFT HUMERUS - 2 VIEW; LEFT SHOULDER COMPARISON:  None Available. FINDINGS: Comminuted fracture of the neck of the left humerus involving the greater tuberosity. No dislocation. Surrounding soft tissue edema. IMPRESSION: Comminuted fracture of the neck of the left humerus involving the greater tuberosity. Electronically Signed   By: Yetta Glassman M.D.   On: 01/10/2022 16:58  DG Humerus Left  Result Date: 01/10/2022 CLINICAL DATA:  Fall EXAM: LEFT HUMERUS - 2 VIEW; LEFT SHOULDER COMPARISON:  None Available. FINDINGS: Comminuted fracture of the neck of the left humerus involving the greater tuberosity. No dislocation. Surrounding soft tissue edema. IMPRESSION: Comminuted fracture of the neck of the left humerus involving the greater tuberosity. Electronically Signed   By: Yetta Glassman M.D.   On: 01/10/2022 16:58    Procedures Procedures  {Document cardiac monitor, telemetry assessment procedure when appropriate:1}  Medications Ordered in ED Medications  oxyCODONE-acetaminophen (PERCOCET/ROXICET) 5-325 MG per tablet 1 tablet (1 tablet Oral Given 01/10/22 1612)  oxyCODONE-acetaminophen (PERCOCET/ROXICET) 5-325 MG per tablet 1 tablet (1 tablet Oral Given 01/10/22 2222)    ED Course/ Medical Decision Making/ A&P                           Medical Decision Making Amount and/or Complexity of Data Reviewed Radiology: ordered.   11:04 PM Spoke with Dr. Roma Kayser with [facility]-- just sling for now, recommends to get CT and can follow-up with Dr. Stann Mainland. Final Clinical Impression(s) / ED Diagnoses Final diagnoses:  None    Rx / DC Orders ED Discharge Orders     None

## 2022-01-11 ENCOUNTER — Emergency Department (HOSPITAL_COMMUNITY): Payer: Medicare Other

## 2022-01-16 ENCOUNTER — Other Ambulatory Visit (INDEPENDENT_AMBULATORY_CARE_PROVIDER_SITE_OTHER): Payer: Self-pay | Admitting: Family Medicine

## 2022-01-16 ENCOUNTER — Other Ambulatory Visit (INDEPENDENT_AMBULATORY_CARE_PROVIDER_SITE_OTHER): Payer: Self-pay | Admitting: Nurse Practitioner

## 2022-01-16 ENCOUNTER — Other Ambulatory Visit: Payer: Self-pay | Admitting: Internal Medicine

## 2022-01-16 ENCOUNTER — Ambulatory Visit: Payer: Medicare Other | Admitting: Nurse Practitioner

## 2022-01-16 DIAGNOSIS — E559 Vitamin D deficiency, unspecified: Secondary | ICD-10-CM

## 2022-01-16 DIAGNOSIS — E1169 Type 2 diabetes mellitus with other specified complication: Secondary | ICD-10-CM

## 2022-01-18 ENCOUNTER — Ambulatory Visit (INDEPENDENT_AMBULATORY_CARE_PROVIDER_SITE_OTHER): Payer: Medicare Other | Admitting: Family Medicine

## 2022-01-23 ENCOUNTER — Ambulatory Visit: Payer: Medicare Other | Admitting: Nurse Practitioner

## 2022-01-23 ENCOUNTER — Ambulatory Visit (INDEPENDENT_AMBULATORY_CARE_PROVIDER_SITE_OTHER): Payer: Medicare Other | Admitting: Family Medicine

## 2022-02-17 ENCOUNTER — Other Ambulatory Visit (INDEPENDENT_AMBULATORY_CARE_PROVIDER_SITE_OTHER): Payer: Self-pay | Admitting: Nurse Practitioner

## 2022-02-17 DIAGNOSIS — E559 Vitamin D deficiency, unspecified: Secondary | ICD-10-CM

## 2022-02-23 ENCOUNTER — Encounter (INDEPENDENT_AMBULATORY_CARE_PROVIDER_SITE_OTHER): Payer: Self-pay | Admitting: Family Medicine

## 2022-02-23 ENCOUNTER — Ambulatory Visit (INDEPENDENT_AMBULATORY_CARE_PROVIDER_SITE_OTHER): Payer: Medicare Other | Admitting: Family Medicine

## 2022-02-23 VITALS — BP 113/71 | HR 93 | Temp 97.8°F | Ht 63.0 in | Wt 259.0 lb

## 2022-02-23 DIAGNOSIS — E669 Obesity, unspecified: Secondary | ICD-10-CM

## 2022-02-23 DIAGNOSIS — Z6841 Body Mass Index (BMI) 40.0 and over, adult: Secondary | ICD-10-CM

## 2022-02-23 DIAGNOSIS — E1169 Type 2 diabetes mellitus with other specified complication: Secondary | ICD-10-CM

## 2022-02-23 DIAGNOSIS — Z7984 Long term (current) use of oral hypoglycemic drugs: Secondary | ICD-10-CM

## 2022-02-23 DIAGNOSIS — E559 Vitamin D deficiency, unspecified: Secondary | ICD-10-CM | POA: Diagnosis not present

## 2022-02-23 MED ORDER — VITAMIN D (ERGOCALCIFEROL) 1.25 MG (50000 UNIT) PO CAPS: 50000.0000 [IU] | ORAL_CAPSULE | ORAL | 0 refills | Status: DC

## 2022-02-23 MED ORDER — METFORMIN HCL ER 500 MG PO TB24: 500.0000 mg | ORAL_TABLET | Freq: Two times a day (BID) | ORAL | 0 refills | Status: DC

## 2022-03-01 NOTE — Progress Notes (Unsigned)
Chief Complaint:   OBESITY Sally [name] is here to discuss her progress with her obesity treatment plan along with follow-up of her obesity related diagnoses. Sally [name] is on the Category 3 Plan and states she is following her eating plan approximately 90% of the time. Sally [name] states she is doing 0 minutes 0 times per week.  Today's visit was #: 56 Starting weight: 281 lbs Starting date: 05/14/2018 Today's weight: 259 lbs Today's date: 02/23/2022 Total lbs lost to date: 22 Total lbs lost since last in-office visit: 13  Interim History: Sally [name]'s last visit was approximately 2 months ago, and she broke her shoulder since her last visit.  She is mindful of her food choices and she has done well with weight loss.  Subjective:   1. Vitamin D deficiency Sally [name] is stable on vitamin D, with no side effects noted.  2. Type 2 diabetes mellitus with other specified complication, without long-term current use of insulin (HCC) Sally [name] is doing very well with diet and weight loss.  She denies nausea, vomiting, or hypoglycemia.  Assessment/Plan:   1. Vitamin D deficiency Sally [name] will continue prescription vitamin D 50,000 units once weekly, and we will refill for 1 month.  - Vitamin D, Ergocalciferol, (DRISDOL) 1.25 MG (50000 UNIT) CAPS capsule; Take 1 capsule (50,000 Units total) by mouth every 7 (seven) days.  Dispense: 4 capsule; Refill: 0  2. Type 2 diabetes mellitus with other specified complication, without long-term current use of insulin (HCC) Sally [name] will continue metformin 500 mg twice daily, and we will refill for 1 month.  - metFORMIN (GLUCOPHAGE-XR) 500 MG 24 hr tablet; Take 1 tablet (500 mg total) by mouth 2 (two) times daily.  Dispense: 60 tablet; Refill: 0  3. Obesity, Current BMI 45.9 Sally [name] is currently in the action stage of change. As such, her goal is to continue with weight loss efforts. She has agreed to the Category 3 Plan.   Exercise goals: Chair exercises were discussed for  lower body strengthening.  Behavioral modification strategies: decreasing simple carbohydrates.  Sally [name] has agreed to follow-up with our clinic in 4 weeks. She was informed of the importance of frequent follow-up visits to maximize her success with intensive lifestyle modifications for her multiple health conditions.   Objective:   Blood pressure 113/71, pulse 93, temperature 97.8 F (36.6 C), height 5\' 3"  (1.6 m), weight 259 lb (117.5 kg), last menstrual period 05/31/2012, SpO2 96 %. Body mass index is 45.88 kg/m.  General: Cooperative, alert, well developed, in no acute distress. HEENT: Conjunctivae and lids unremarkable. Cardiovascular: Regular rhythm.  Lungs: Normal work of breathing. Neurologic: No focal deficits.   Lab Results  Component Value Date   CREATININE 0.76 10/25/2021   BUN 18 10/25/2021   NA 139 10/25/2021   K 4.8 10/25/2021   CL 101 10/25/2021   CO2 25 10/25/2021   Lab Results  Component Value Date   ALT 13 10/25/2021   AST 16 10/25/2021   ALKPHOS 85 10/25/2021   BILITOT 0.3 10/25/2021   Lab Results  Component Value Date   HGBA1C 5.9 (H) 10/25/2021   HGBA1C 6.0 (H) 06/29/2021   HGBA1C 6.3 (H) 03/07/2021   HGBA1C 5.8 (H) 10/07/2020   HGBA1C 5.8 (H) 01/12/2020   Lab Results  Component Value Date   INSULIN 7.5 10/25/2021   INSULIN 6.4 06/29/2021   INSULIN 7.5 09/02/2019   INSULIN 4.2 04/17/2019   INSULIN 6.6 10/08/2018   Lab Results  Component Value Date   TSH 3.190  09/02/2019   Lab Results  Component Value Date   CHOL 176 06/29/2021   HDL 62 06/29/2021   LDLCALC 82 06/29/2021   TRIG 189 (H) 06/29/2021   CHOLHDL 2.8 06/29/2021   Lab Results  Component Value Date   VD25OH 43.3 10/25/2021   VD25OH 31.4 06/29/2021   VD25OH 38.9 03/07/2021   Lab Results  Component Value Date   WBC 11.4 (H) 11/21/2020   HGB 12.9 12/13/2020   HCT 38.0 12/13/2020   MCV 90.7 11/21/2020   PLT 212 11/21/2020   No results found for: "IRON", "TIBC",  "FERRITIN"  Obesity Behavioral Intervention:   Approximately 15 minutes were spent on the discussion below.  ASK: We discussed the diagnosis of obesity with Sally [name] today and Sally [name] agreed to give [name] permission to discuss obesity behavioral modification therapy today.  ASSESS: Sally [name] has the diagnosis of obesity and her BMI today is 45.9. Sally [name] is in the action stage of change.   ADVISE: Sally [name] was educated on the multiple health risks of obesity as well as the benefit of weight loss to improve her health. She was advised of the need for long term treatment and the importance of lifestyle modifications to improve her current health and to decrease her risk of future health problems.  AGREE: Multiple dietary modification options and treatment options were discussed and Sally [name] agreed to follow the recommendations documented in the above note.  ARRANGE: Sally [name] was educated on the importance of frequent visits to treat obesity as outlined per CMS and USPSTF guidelines and agreed to schedule her next follow up appointment today.  Attestation Statements:   Reviewed by clinician on day of visit: allergies, medications, problem list, medical history, surgical history, family history, social history, and previous encounter notes.   I, Burt Knack, am acting as transcriptionist for Quillian Quince, MD.  I have reviewed the above documentation for accuracy and completeness, and I agree with the above. -  Quillian Quince, MD

## 2022-03-08 ENCOUNTER — Encounter (INDEPENDENT_AMBULATORY_CARE_PROVIDER_SITE_OTHER): Payer: Self-pay

## 2022-03-09 ENCOUNTER — Other Ambulatory Visit (INDEPENDENT_AMBULATORY_CARE_PROVIDER_SITE_OTHER): Payer: Self-pay | Admitting: Family Medicine

## 2022-03-09 DIAGNOSIS — E1169 Type 2 diabetes mellitus with other specified complication: Secondary | ICD-10-CM

## 2022-03-20 ENCOUNTER — Other Ambulatory Visit (HOSPITAL_COMMUNITY)
Admission: RE | Admit: 2022-03-20 | Discharge: 2022-03-20 | Disposition: A | Discharge status: Home / Self Care | Payer: Medicare Other | Source: Ambulatory Visit | Attending: Nurse Practitioner | Admitting: Nurse Practitioner

## 2022-03-20 ENCOUNTER — Encounter: Payer: Self-pay | Admitting: Nurse Practitioner

## 2022-03-20 ENCOUNTER — Ambulatory Visit (INDEPENDENT_AMBULATORY_CARE_PROVIDER_SITE_OTHER): Payer: Medicare Other | Admitting: Nurse Practitioner

## 2022-03-20 VITALS — BP 114/72 | HR 96 | Ht 63.0 in | Wt 258.0 lb

## 2022-03-20 DIAGNOSIS — F32A Depression, unspecified: Secondary | ICD-10-CM

## 2022-03-20 DIAGNOSIS — Z1151 Encounter for screening for human papillomavirus (HPV): Secondary | ICD-10-CM | POA: Diagnosis not present

## 2022-03-20 DIAGNOSIS — Z780 Asymptomatic menopausal state: Secondary | ICD-10-CM | POA: Diagnosis not present

## 2022-03-20 DIAGNOSIS — Z01419 Encounter for gynecological examination (general) (routine) without abnormal findings: Secondary | ICD-10-CM

## 2022-03-20 DIAGNOSIS — Z9189 Other specified personal risk factors, not elsewhere classified: Secondary | ICD-10-CM | POA: Diagnosis not present

## 2022-03-20 DIAGNOSIS — Z124 Encounter for screening for malignant neoplasm of cervix: Secondary | ICD-10-CM | POA: Diagnosis not present

## 2022-03-20 NOTE — Progress Notes (Signed)
Sally [name] 08-12-1960 [id number]   History:  61 y.o. [id number] presents for annual exam. Postmenopausal - no HRT, no bleeding. Normal pap history. H/O PE, RA, T2DM. Reports excessive sadness lately related to being sick/her health and close deaths, FIL is not doing well. On Wellbutrin 150 mg daily and Cymbalta 30 mg, managed by PCP.   Gynecologic History Patient's last menstrual period was 05/31/2012.   Contraception/Family planning: post menopausal status Sexually active: Yes  Health Maintenance Last Pap: 11/28/2016. Results were: Normal Last mammogram: 01/20/2021. Results were: Normal. Scheduled today Last colonoscopy: 2014. Negative Cologuard 07/2020 Last Dexa: 01/24/2018. Results were: Normal  Past medical history, past surgical history, family history and social history were all reviewed and documented in the EPIC chart. Married.  ROS:  A ROS was performed and pertinent positives and negatives are included.  Exam:  Vitals:   03/20/22 1028  BP: 114/72  Pulse: 96  SpO2: 97%  Weight: 258 lb (117 kg)  Height: 5\' 3"  (1.6 m)   Body mass index is 45.7 kg/m.  General appearance:  Normal Thyroid:  Symmetrical, normal in size, without palpable masses or nodularity. Respiratory  Auscultation:  Clear without wheezing or rhonchi Cardiovascular  Auscultation:  Regular rate, without rubs, murmurs or gallops  Edema/varicosities:  Not grossly evident Abdominal  Soft,nontender, without masses, guarding or rebound.  Liver/spleen:  No organomegaly noted  Hernia:  None appreciated  Skin  Inspection:  Grossly normal Breasts: Examined lying and sitting.   Right: Without masses, retractions, nipple discharge or axillary adenopathy.   Left: Without masses, retractions, nipple discharge or axillary adenopathy. Genitourinary   Inguinal/mons:  Normal without inguinal adenopathy  External genitalia:  Normal appearing vulva with no masses, tenderness, or lesions  BUS/Urethra/Skene's  glands:  Normal  Vagina:  Normal appearing with normal color and discharge, no lesions  Cervix:  Normal appearing without discharge or lesions  Uterus:  Difficult to palpate but no gross masses or tenderness  Adnexa/parametria:     Rt: Normal in size, without masses or tenderness.   Lt: Normal in size, without masses or tenderness.  Anus and perineum: Normal  Digital rectal exam: Normal sphincter tone without palpated masses or tenderness  Patient informed chaperone available to be present for breast and pelvic exam. Patient has requested no chaperone to be present. Patient has been advised what will be completed during breast and pelvic exam.   Assessment/Plan:  61 y.o. 62 for annual exam.   Well female exam with routine gynecological exam - Plan: Cytology - PAP( [place]). Education provided on SBEs, importance of preventative screenings, current guidelines, high calcium diet, regular exercise, and multivitamin daily.  Labs with PCP.   Postmenopausal - no HRT, no bleeding  Depression, unspecified depression type - Reports excessive sadness lately related to being sick/her health and close deaths, FIL is not doing well. On Wellbutrin 150 mg daily and Cymbalta 30 mg, managed by PCP. Recommend discussing with PCP since they are managing this already.   Screening for cervical cancer - Normal Pap history. Pap today.   Screening for breast cancer - Normal mammogram history.  Continue annual screenings.  Normal breast exam today. Mammogram scheduled today.   Screening for colon cancer - Negative Cologuard 07/2020. Will repeat at 3-year interval per recommendation.   Screening for osteoporosis - Normal bone density in 2019. Will repeat at age 52.   Return in 1 year for annual.     76 DNP, 11:09 AM 03/20/2022

## 2022-03-23 ENCOUNTER — Encounter: Payer: Self-pay | Admitting: Nurse Practitioner

## 2022-03-23 LAB — CYTOLOGY - PAP
Comment: NEGATIVE
Diagnosis: UNDETERMINED — AB
High risk HPV: NEGATIVE

## 2022-03-28 ENCOUNTER — Ambulatory Visit (INDEPENDENT_AMBULATORY_CARE_PROVIDER_SITE_OTHER): Payer: Medicare Other | Admitting: Family Medicine

## 2022-04-01 ENCOUNTER — Other Ambulatory Visit: Payer: Self-pay | Admitting: Internal Medicine

## 2022-04-01 ENCOUNTER — Other Ambulatory Visit (INDEPENDENT_AMBULATORY_CARE_PROVIDER_SITE_OTHER): Payer: Self-pay | Admitting: Nurse Practitioner

## 2022-04-01 DIAGNOSIS — E559 Vitamin D deficiency, unspecified: Secondary | ICD-10-CM

## 2022-04-10 ENCOUNTER — Encounter (INDEPENDENT_AMBULATORY_CARE_PROVIDER_SITE_OTHER): Payer: Self-pay | Admitting: Family Medicine

## 2022-04-10 ENCOUNTER — Ambulatory Visit (INDEPENDENT_AMBULATORY_CARE_PROVIDER_SITE_OTHER): Payer: Medicare Other | Admitting: Nurse Practitioner

## 2022-04-10 ENCOUNTER — Ambulatory Visit (INDEPENDENT_AMBULATORY_CARE_PROVIDER_SITE_OTHER): Payer: Medicare Other | Admitting: Family Medicine

## 2022-04-10 VITALS — BP 127/84 | HR 90 | Temp 97.7°F | Ht 63.0 in | Wt 247.0 lb

## 2022-04-10 DIAGNOSIS — D508 Other iron deficiency anemias: Secondary | ICD-10-CM

## 2022-04-10 DIAGNOSIS — E1169 Type 2 diabetes mellitus with other specified complication: Secondary | ICD-10-CM

## 2022-04-10 DIAGNOSIS — E559 Vitamin D deficiency, unspecified: Secondary | ICD-10-CM | POA: Diagnosis not present

## 2022-04-10 DIAGNOSIS — Z6841 Body Mass Index (BMI) 40.0 and over, adult: Secondary | ICD-10-CM

## 2022-04-10 DIAGNOSIS — E66813 Obesity, class 3: Secondary | ICD-10-CM

## 2022-04-10 DIAGNOSIS — Z7984 Long term (current) use of oral hypoglycemic drugs: Secondary | ICD-10-CM

## 2022-04-10 DIAGNOSIS — Z7985 Long-term (current) use of injectable non-insulin antidiabetic drugs: Secondary | ICD-10-CM

## 2022-04-10 DIAGNOSIS — E669 Obesity, unspecified: Secondary | ICD-10-CM | POA: Diagnosis not present

## 2022-04-10 MED ORDER — VITAMIN D (ERGOCALCIFEROL) 1.25 MG (50000 UNIT) PO CAPS: 50000.0000 [IU] | ORAL_CAPSULE | ORAL | 0 refills | Status: DC

## 2022-04-16 DIAGNOSIS — D649 Anemia, unspecified: Secondary | ICD-10-CM | POA: Insufficient documentation

## 2022-04-16 NOTE — Progress Notes (Unsigned)
Chief Complaint:   OBESITY Sally [name] is here to discuss her progress with her obesity treatment plan along with follow-up of her obesity related diagnoses. Sally [name] is on the Category 3 Plan and states she is following her eating plan approximately 60% of the time. Sally [name] states she is not exercising.   Today's visit was #: 69 Starting weight: 281 lbs Starting date: 05/14/2018 Today's weight: 247 lbs Today's date: 04/10/2022 Total lbs lost to date: 34 lbs Total lbs lost since last in-office visit: 12 lbs  Interim History: left shoulder fracture healed.  Appetite has decreased with recent UTI.  Still working on getting in protein with each meal.  Getting in some fruits and veggies.  Has increased water intake.   Subjective:   1. Type 2 diabetes mellitus with other specified complication, unspecified whether long term insulin use (HCC) On Ozempic 0.5 mg weekly, through PCP and metformin 500 mg BID.  Last A1c 5.9  2. Vitamin D deficiency Last Vitamin D level 43.3  3. Other iron deficiency anemia Last iron saturated low in June with complaints of fatigue.  Reports up to date colorectal cancer screening.    Assessment/Plan:   1. Type 2 diabetes mellitus with other specified complication, unspecified whether long term insulin use (HCC) Continue current medications per PCP along with a low sugar, high protein diet.   2. Vitamin D deficiency Recheck Vitamin D level in 4 weeks.   Refill - Vitamin D, Ergocalciferol, (DRISDOL) 1.25 MG (50000 UNIT) CAPS capsule; Take 1 capsule (50,000 Units total) by mouth every 7 (seven) days.  Dispense: 12 capsule; Refill: 0  3. Other iron deficiency anemia Check iron level in 4 weeks.   4. Obesity,current BMI 43.8 Plan to increased outside walking time.    Sally [name] is currently in the action stage of change. As such, her goal is to continue with weight loss efforts. She has agreed to the Category 3 Plan + 85 protein daily.   Exercise goals:   increase walking time.   Behavioral modification strategies: increasing lean protein intake, increasing water intake, decreasing eating out, no skipping meals, keeping healthy foods in the home, and decreasing junk food.  Sally [name] has agreed to follow-up with our clinic in 3 weeks. She was informed of the importance of frequent follow-up visits to maximize her success with intensive lifestyle modifications for her multiple health conditions.   Objective:   Blood pressure 127/84, pulse 90, temperature 97.7 F (36.5 C), height 5\' 3"  (1.6 m), weight 247 lb (112 kg), last menstrual period 05/31/2012, SpO2 99 %. Body mass index is 43.75 kg/m.  General: Cooperative, alert, well developed, in no acute distress. HEENT: Conjunctivae and lids unremarkable. Cardiovascular: Regular rhythm.  Lungs: Normal work of breathing. Neurologic: No focal deficits.   Lab Results  Component Value Date   CREATININE 0.76 10/25/2021   BUN 18 10/25/2021   NA 139 10/25/2021   K 4.8 10/25/2021   CL 101 10/25/2021   CO2 25 10/25/2021   Lab Results  Component Value Date   ALT 13 10/25/2021   AST 16 10/25/2021   ALKPHOS 85 10/25/2021   BILITOT 0.3 10/25/2021   Lab Results  Component Value Date   HGBA1C 5.9 (H) 10/25/2021   HGBA1C 6.0 (H) 06/29/2021   HGBA1C 6.3 (H) 03/07/2021   HGBA1C 5.8 (H) 10/07/2020   HGBA1C 5.8 (H) 01/12/2020   Lab Results  Component Value Date   INSULIN 7.5 10/25/2021   INSULIN 6.4 06/29/2021   INSULIN  7.5 09/02/2019   INSULIN 4.2 04/17/2019   INSULIN 6.6 10/08/2018   Lab Results  Component Value Date   TSH 3.190 09/02/2019   Lab Results  Component Value Date   CHOL 176 06/29/2021   HDL 62 06/29/2021   LDLCALC 82 06/29/2021   TRIG 189 (H) 06/29/2021   CHOLHDL 2.8 06/29/2021   Lab Results  Component Value Date   VD25OH 43.3 10/25/2021   VD25OH 31.4 06/29/2021   VD25OH 38.9 03/07/2021   Lab Results  Component Value Date   WBC 11.4 (H) 11/21/2020   HGB 12.9  12/13/2020   HCT 38.0 12/13/2020   MCV 90.7 11/21/2020   PLT 212 11/21/2020   No results found for: "IRON", "TIBC", "FERRITIN"  Attestation Statements:   Reviewed by clinician on day of visit: allergies, medications, problem list, medical history, surgical history, family history, social history, and previous encounter notes.  I, Davy Pique, am acting as Location manager for Loyal Gambler, DO.  I have reviewed the above documentation for accuracy and completeness, and I agree with the above. Dell Ponto, DO

## 2022-05-06 ENCOUNTER — Other Ambulatory Visit: Payer: Self-pay | Admitting: Internal Medicine

## 2022-05-08 ENCOUNTER — Other Ambulatory Visit (INDEPENDENT_AMBULATORY_CARE_PROVIDER_SITE_OTHER): Payer: Self-pay | Admitting: Family Medicine

## 2022-05-08 DIAGNOSIS — E1169 Type 2 diabetes mellitus with other specified complication: Secondary | ICD-10-CM

## 2022-05-15 ENCOUNTER — Encounter (INDEPENDENT_AMBULATORY_CARE_PROVIDER_SITE_OTHER): Payer: Self-pay | Admitting: Family Medicine

## 2022-05-15 ENCOUNTER — Ambulatory Visit (INDEPENDENT_AMBULATORY_CARE_PROVIDER_SITE_OTHER): Payer: Medicare Other | Admitting: Family Medicine

## 2022-05-15 VITALS — BP 136/85 | HR 78 | Temp 98.0°F | Ht 63.0 in | Wt 250.0 lb

## 2022-05-15 DIAGNOSIS — E669 Obesity, unspecified: Secondary | ICD-10-CM | POA: Diagnosis not present

## 2022-05-15 DIAGNOSIS — E66813 Obesity, class 3: Secondary | ICD-10-CM

## 2022-05-15 DIAGNOSIS — Z7984 Long term (current) use of oral hypoglycemic drugs: Secondary | ICD-10-CM

## 2022-05-15 DIAGNOSIS — Z6841 Body Mass Index (BMI) 40.0 and over, adult: Secondary | ICD-10-CM

## 2022-05-15 DIAGNOSIS — E1169 Type 2 diabetes mellitus with other specified complication: Secondary | ICD-10-CM | POA: Diagnosis not present

## 2022-05-15 DIAGNOSIS — E538 Deficiency of other specified B group vitamins: Secondary | ICD-10-CM | POA: Diagnosis not present

## 2022-05-15 DIAGNOSIS — E559 Vitamin D deficiency, unspecified: Secondary | ICD-10-CM

## 2022-05-15 MED ORDER — METFORMIN HCL ER 500 MG PO TB24: 500.0000 mg | ORAL_TABLET | Freq: Two times a day (BID) | ORAL | 0 refills | Status: DC

## 2022-05-29 ENCOUNTER — Other Ambulatory Visit (INDEPENDENT_AMBULATORY_CARE_PROVIDER_SITE_OTHER): Payer: Self-pay | Admitting: Family Medicine

## 2022-05-29 DIAGNOSIS — E1169 Type 2 diabetes mellitus with other specified complication: Secondary | ICD-10-CM

## 2022-06-04 NOTE — Progress Notes (Unsigned)
Chief Complaint:   OBESITY Caidance is here to discuss her progress with her obesity treatment plan along with follow-up of her obesity related diagnoses. Shenell is on the Category 3 Plan with 85 grams protein and states she is following her eating plan approximately 80% of the time. Manuela states she is walking 20 minutes 6 times per week.  Today's visit was #: 58 Starting weight: 281 lbs Starting date: 05/14/2018 Today's weight: 250 lbs Today's date: 05/15/2022 Total lbs lost to date: 31 Total lbs lost since last in-office visit: +3  Interim History: This is Euline's first OV with me, as she has previously seen Dr. Manson Passey. Pt complains of increased swelling in her ankles and legs, which goes down in the mornings. She takes Lasix as needed (usually 2-3 times a day.   Subjective:   1. Type 2 diabetes mellitus with other specified complication, without long-term current use of insulin (HCC) Ericka ran out of Metformin last week, and she takes it twice a day. She denies hunger or cravings.  2. Vitamin D deficiency Pt last Vit D level was 43.3 on 10/25/2021. She is on Ergocalciferol weekly and has been consistent with taking it every Monday.  3. Vitamin B12 deficiency Her B12 was last checked 4 months ago and was within normal limits at 674. Pt takes IM injections but is not sure of dosage or frequency.  Assessment/Plan:  No orders of the defined types were placed in this encounter.   Medications Discontinued During This Encounter  Medication Reason   metFORMIN (GLUCOPHAGE-XR) 500 MG 24 hr tablet Reorder     Meds ordered this encounter  Medications   metFORMIN (GLUCOPHAGE-XR) 500 MG 24 hr tablet    Sig: Take 1 tablet (500 mg total) by mouth 2 (two) times daily.    Dispense:  180 tablet    Refill:  0    30 d supply;  ** OV for RF **   Do not send RF request     1. Type 2 diabetes mellitus with other specified complication, without long-term current use of insulin  (HCC) Good blood sugar control is important to decrease the likelihood of diabetic complications such as nephropathy, neuropathy, limb loss, blindness, coronary artery disease, and death. Intensive lifestyle modification including diet, exercise and weight loss are the first line of treatment for diabetes.  Pt is going to see her PCP in [facility]) in 2 days. Her last A1c was 5.8 and she declines labs today.  Refill (90 day supply due to insurance issues-[facility])- metFORMIN (GLUCOPHAGE-XR) 500 MG 24 hr tablet; Take 1 tablet (500 mg total) by mouth 2 (two) times daily.  Dispense: 180 tablet; Refill: 0  2. Vitamin D deficiency Low Vitamin D level contributes to fatigue and are associated with obesity, breast, and colon cancer. She agrees to continue to take prescription Vitamin D @50 ,000 IU every week and will follow-up for routine testing of Vitamin D, at least 2-3 times per year to avoid over-replacement. Recheck labs with PCP in 2 days, or we will recheck at next OV.  3. Vitamin B12 deficiency The diagnosis was reviewed with the patient. Counseling provided today, see below. We will continue to monitor. Orders and follow up as documented in patient record. Refills per PCP. Continue prudent nutritional plan. Pt advised to find out B12 IM dosage and frequency for our records.  Counseling The body needs vitamin B12: to make red blood cells; to make DNA; and to help the nerves work  properly so they can carry messages from the brain to the body.  The main causes of vitamin B12 deficiency include dietary deficiency, digestive diseases, pernicious anemia, and having a surgery in which part of the stomach or small intestine is removed.  Certain medicines can make it harder for the body to absorb vitamin B12. These medicines include: heartburn medications; some antibiotics; some medications used to treat diabetes, gout, and high cholesterol.  In some cases, there are no symptoms of this condition. If the  condition leads to anemia or nerve damage, various symptoms can occur, such as weakness or fatigue, shortness of breath, and numbness or tingling in your hands and feet.   Treatment:  May include taking vitamin B12 supplements.  Avoid alcohol.  Eat lots of healthy foods that contain vitamin B12: Beef, pork, chicken, [place], and organ meats, such as liver.  Seafood: This includes clams, rainbow trout, salmon, tuna, and haddock. Eggs.  Cereal and dairy products that are fortified: This means that vitamin B12 has been added to the food.   4. Obesity,current BMI 44.3 George is currently in the action stage of change. As such, her goal is to continue with weight loss efforts. She has agreed to the Category 3 Plan.   Pt will continue walking and try to increase to 30 minutes per day.  Handout: Category 3  Exercise goals:  As is but increase as tolerated.  Behavioral modification strategies: increasing lean protein intake and decreasing simple carbohydrates.  Teea has agreed to follow-up with our clinic in 4 weeks with NP Dawn. She was informed of the importance of frequent follow-up visits to maximize her success with intensive lifestyle modifications for her multiple health conditions.   Objective:   Blood pressure 136/85, pulse 78, temperature 98 F (36.7 C), height 5\' 3"  (1.6 m), weight 250 lb (113.4 kg), last menstrual period 05/31/2012, SpO2 99 %. Body mass index is 44.29 kg/m.  General: Cooperative, alert, well developed, in no acute distress. HEENT: Conjunctivae and lids unremarkable. Cardiovascular: Regular rhythm.  Lungs: Normal work of breathing. Neurologic: No focal deficits.   Lab Results  Component Value Date   CREATININE 0.76 10/25/2021   BUN 18 10/25/2021   NA 139 10/25/2021   K 4.8 10/25/2021   CL 101 10/25/2021   CO2 25 10/25/2021   Lab Results  Component Value Date   ALT 13 10/25/2021   AST 16 10/25/2021   ALKPHOS 85 10/25/2021   BILITOT 0.3 10/25/2021    Lab Results  Component Value Date   HGBA1C 5.9 (H) 10/25/2021   HGBA1C 6.0 (H) 06/29/2021   HGBA1C 6.3 (H) 03/07/2021   HGBA1C 5.8 (H) 10/07/2020   HGBA1C 5.8 (H) 01/12/2020   Lab Results  Component Value Date   INSULIN 7.5 10/25/2021   INSULIN 6.4 06/29/2021   INSULIN 7.5 09/02/2019   INSULIN 4.2 04/17/2019   INSULIN 6.6 10/08/2018   Lab Results  Component Value Date   TSH 3.190 09/02/2019   Lab Results  Component Value Date   CHOL 176 06/29/2021   HDL 62 06/29/2021   LDLCALC 82 06/29/2021   TRIG 189 (H) 06/29/2021   CHOLHDL 2.8 06/29/2021   Lab Results  Component Value Date   VD25OH 43.3 10/25/2021   VD25OH 31.4 06/29/2021   VD25OH 38.9 03/07/2021   Lab Results  Component Value Date   WBC 11.4 (H) 11/21/2020   HGB 12.9 12/13/2020   HCT 38.0 12/13/2020   MCV 90.7 11/21/2020   PLT 212 11/21/2020  No results found for: "IRON", "TIBC", "FERRITIN"  Obesity Behavioral Intervention:   Approximately 15 minutes were spent on the discussion below.  ASK: We discussed the diagnosis of obesity with [name] today and Annett agreed to give [name] permission to discuss obesity behavioral modification therapy today.  ASSESS: Annajulia has the diagnosis of obesity and her BMI today is 44.3. Jayni is in the action stage of change.   ADVISE: Tia was educated on the multiple health risks of obesity as well as the benefit of weight loss to improve her health. She was advised of the need for long term treatment and the importance of lifestyle modifications to improve her current health and to decrease her risk of future health problems.  AGREE: Multiple dietary modification options and treatment options were discussed and Oneka agreed to follow the recommendations documented in the above note.  ARRANGE: Taisa was educated on the importance of frequent visits to treat obesity as outlined per CMS and USPSTF guidelines and agreed to schedule her next follow up appointment  today.  Attestation Statements:   Reviewed by clinician on day of visit: allergies, medications, problem list, medical history, surgical history, family history, social history, and previous encounter notes.  I, Kyung Rudd, BS, CMA, am acting as transcriptionist for [employer], DO.   I have reviewed the above documentation for accuracy and completeness, and I agree with the above. Carlye Grippe, D.O.  The 21st Century Cures Act was signed into law in 2016 which includes the topic of electronic health records.  This provides immediate access to information in MyChart.  This includes consultation notes, operative notes, office notes, lab results and pathology reports.  If you have any questions about what you read please let us know at your next visit so we can discuss your concerns and take corrective action if need be.  We are right here with you.

## 2022-06-07 ENCOUNTER — Other Ambulatory Visit (INDEPENDENT_AMBULATORY_CARE_PROVIDER_SITE_OTHER): Payer: Self-pay | Admitting: Family Medicine

## 2022-06-07 DIAGNOSIS — E559 Vitamin D deficiency, unspecified: Secondary | ICD-10-CM

## 2022-06-11 NOTE — Progress Notes (Signed)
TeleHealth Visit:  This visit was completed with telemedicine (audio/video) technology. Sally [name] has verbally consented to this TeleHealth visit. The patient is located at home, the provider is located at home. The participants in this visit include the listed provider and patient. The visit was conducted today via MyChart video.  OBESITY Sally [name] is here to discuss her progress with her obesity treatment plan along with follow-up of her obesity related diagnoses.   Today's visit was # 59 Starting weight: 281 lbs Starting date: 05/14/2018 Weight at last in office visit: 250 lbs on 05/15/22 Total weight loss: 31 lbs at last in office visit on 05/15/22. Today's reported weight:  No weight reported.  Nutrition Plan: the Category 3 Plan.   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   Interim History: Sally [name] feels she may be having some food sensitivities to milk, eggs, red meat. She has not been getting all of the prescribed protein in.  She is currently helping her daughter-in-law with her twins who are 73 weeks old-she goes over there to help her feed them at lunchtime.  Her daughter-in-law also has a 33-year-old and a 53-year-old.  She does not pack lunch. She reports having juice but not daily-pineapple juice, OJ, diet cranberry juice.  She would like more ideas for breakfast.  Assessment/Plan:  1. Type II Diabetes HgbA1c is at goal. Last A1c was 5.7 on 05/17/2022 in care everywhere.  Medication(s): Ozempic 1 mg weekly.  Obtains through PAP program through her PCP.  Tolerating well. Also taking metformin 500 XR twice daily.  Lab Results  Component Value Date   HGBA1C 5.9 (H) 10/25/2021   HGBA1C 6.0 (H) 06/29/2021   HGBA1C 6.3 (H) 03/07/2021   Lab Results  Component Value Date   LDLCALC 82 06/29/2021   CREATININE 0.76 10/25/2021    Plan: Refill metformin XR 500 mg twice daily with meals. She will contact PCP to  get refill for Ozempic 1 mg weekly through PAP program.   2. Vitamin D Deficiency Vitamin D is not at goal of 50.  Last vitamin D was 43.3 on 10/25/2021. She is on weekly prescription Vitamin D 50,000 IU.  Lab Results  Component Value Date   VD25OH 43.3 10/25/2021   VD25OH 31.4 06/29/2021   VD25OH 38.9 03/07/2021    Plan: Refill prescription vitamin D 50,000 IU weekly.   3. Obesity: Current BMI 44.3 Sally [name] is currently in the action stage of change. As such, her goal is to continue with weight loss efforts.  She has agreed to the Category 3 Plan.   Recipe for egg bite sent via MyChart. Discussed other options for breakfast. Will pack sandwich to take to work with her.  Exercise goals:  as is  Behavioral modification strategies: increasing lean protein intake, meal planning and cooking strategies, and planning for success.  Sally [name] has agreed to follow-up with our clinic in 2 weeks.   No orders of the defined types were placed in this encounter.   Medications Discontinued During This Encounter  Medication Reason   Vitamin D, Ergocalciferol, (DRISDOL) 1.25 MG (50000 UNIT) CAPS capsule Reorder   metFORMIN (GLUCOPHAGE-XR) 500 MG 24 hr tablet Reorder     Meds ordered this encounter  Medications   Vitamin D, Ergocalciferol, (DRISDOL) 1.25 MG (50000 UNIT) CAPS capsule    Sig: Take 1 capsule (50,000 Units total) by mouth every 7 (seven) days.    Dispense:  12 capsule  Refill:  0    Order Specific Question:   Supervising Provider    Answer:   Glennis Brink [[id number]]   metFORMIN (GLUCOPHAGE-XR) 500 MG 24 hr tablet    Sig: Take 1 tablet (500 mg total) by mouth 2 (two) times daily.    Dispense:  180 tablet    Refill:  0    Order Specific Question:   Supervising Provider    Answer:   Glennis Brink [[id number]]      Objective:   VITALS: Per patient if applicable, see vitals. GENERAL: Alert and in no acute distress. CARDIOPULMONARY: No increased WOB. Speaking in clear sentences.   PSYCH: Pleasant and cooperative. Speech normal rate and rhythm. Affect is appropriate. Insight and judgement are appropriate. Attention is focused, linear, and appropriate.  NEURO: Oriented as arrived to appointment on time with no prompting.   Lab Results  Component Value Date   CREATININE 0.76 10/25/2021   BUN 18 10/25/2021   NA 139 10/25/2021   K 4.8 10/25/2021   CL 101 10/25/2021   CO2 25 10/25/2021   Lab Results  Component Value Date   ALT 13 10/25/2021   AST 16 10/25/2021   ALKPHOS 85 10/25/2021   BILITOT 0.3 10/25/2021   Lab Results  Component Value Date   HGBA1C 5.9 (H) 10/25/2021   HGBA1C 6.0 (H) 06/29/2021   HGBA1C 6.3 (H) 03/07/2021   HGBA1C 5.8 (H) 10/07/2020   HGBA1C 5.8 (H) 01/12/2020   Lab Results  Component Value Date   INSULIN 7.5 10/25/2021   INSULIN 6.4 06/29/2021   INSULIN 7.5 09/02/2019   INSULIN 4.2 04/17/2019   INSULIN 6.6 10/08/2018   Lab Results  Component Value Date   TSH 3.190 09/02/2019   Lab Results  Component Value Date   CHOL 176 06/29/2021   HDL 62 06/29/2021   LDLCALC 82 06/29/2021   TRIG 189 (H) 06/29/2021   CHOLHDL 2.8 06/29/2021   Lab Results  Component Value Date   WBC 11.4 (H) 11/21/2020   HGB 12.9 12/13/2020   HCT 38.0 12/13/2020   MCV 90.7 11/21/2020   PLT 212 11/21/2020   No results found for: "IRON", "TIBC", "FERRITIN" Lab Results  Component Value Date   VD25OH 43.3 10/25/2021   VD25OH 31.4 06/29/2021   VD25OH 38.9 03/07/2021    Attestation Statements:   Reviewed by clinician on day of visit: allergies, medications, problem list, medical history, surgical history, family history, social history, and previous encounter notes.

## 2022-06-12 ENCOUNTER — Encounter (INDEPENDENT_AMBULATORY_CARE_PROVIDER_SITE_OTHER): Payer: Self-pay | Admitting: Family Medicine

## 2022-06-12 ENCOUNTER — Telehealth (INDEPENDENT_AMBULATORY_CARE_PROVIDER_SITE_OTHER): Payer: Medicare Other | Admitting: Family Medicine

## 2022-06-12 DIAGNOSIS — Z6841 Body Mass Index (BMI) 40.0 and over, adult: Secondary | ICD-10-CM | POA: Diagnosis not present

## 2022-06-12 DIAGNOSIS — E559 Vitamin D deficiency, unspecified: Secondary | ICD-10-CM

## 2022-06-12 DIAGNOSIS — E1169 Type 2 diabetes mellitus with other specified complication: Secondary | ICD-10-CM

## 2022-06-12 DIAGNOSIS — E669 Obesity, unspecified: Secondary | ICD-10-CM | POA: Diagnosis not present

## 2022-06-12 DIAGNOSIS — Z7985 Long-term (current) use of injectable non-insulin antidiabetic drugs: Secondary | ICD-10-CM

## 2022-06-12 MED ORDER — METFORMIN HCL ER 500 MG PO TB24: 500.0000 mg | ORAL_TABLET | Freq: Two times a day (BID) | ORAL | 0 refills | Status: DC

## 2022-06-12 MED ORDER — VITAMIN D (ERGOCALCIFEROL) 1.25 MG (50000 UNIT) PO CAPS: 50000.0000 [IU] | ORAL_CAPSULE | ORAL | 0 refills | Status: DC

## 2022-06-15 ENCOUNTER — Telehealth: Payer: Self-pay | Admitting: Internal Medicine

## 2022-06-15 ENCOUNTER — Other Ambulatory Visit: Payer: Self-pay

## 2022-06-15 MED ORDER — ALBUTEROL SULFATE HFA 108 (90 BASE) MCG/ACT IN AERS: 2.0000 | INHALATION_SPRAY | Freq: Four times a day (QID) | RESPIRATORY_TRACT | 0 refills | Status: AC | PRN

## 2022-06-15 MED ORDER — FAMOTIDINE 20 MG PO TABS: ORAL_TABLET | ORAL | 0 refills | Status: AC

## 2022-06-15 MED ORDER — BUDESONIDE-FORMOTEROL FUMARATE 80-4.5 MCG/ACT IN AERO: INHALATION_SPRAY | RESPIRATORY_TRACT | 3 refills | Status: AC

## 2022-06-15 NOTE — Telephone Encounter (Signed)
Patient called to request a refill for her asthma medication, famotidine.  She stated she usually gets it through [facility].  Please advise.

## 2022-06-15 NOTE — Telephone Encounter (Signed)
Called Pt to confirm the pharmacy that she needed her refills to go to. Pt confirmed [facility] and nothing further needed.

## 2022-06-26 ENCOUNTER — Encounter (INDEPENDENT_AMBULATORY_CARE_PROVIDER_SITE_OTHER): Payer: Self-pay | Admitting: Family Medicine

## 2022-06-26 ENCOUNTER — Ambulatory Visit (INDEPENDENT_AMBULATORY_CARE_PROVIDER_SITE_OTHER): Payer: Medicare Other | Admitting: Family Medicine

## 2022-06-26 VITALS — BP 134/81 | HR 91 | Temp 98.3°F | Ht 63.0 in | Wt 244.0 lb

## 2022-06-26 DIAGNOSIS — E669 Obesity, unspecified: Secondary | ICD-10-CM | POA: Diagnosis not present

## 2022-06-26 DIAGNOSIS — E1165 Type 2 diabetes mellitus with hyperglycemia: Secondary | ICD-10-CM

## 2022-06-26 DIAGNOSIS — D508 Other iron deficiency anemias: Secondary | ICD-10-CM | POA: Diagnosis not present

## 2022-06-26 DIAGNOSIS — E559 Vitamin D deficiency, unspecified: Secondary | ICD-10-CM | POA: Diagnosis not present

## 2022-06-26 DIAGNOSIS — Z7985 Long-term (current) use of injectable non-insulin antidiabetic drugs: Secondary | ICD-10-CM

## 2022-06-26 DIAGNOSIS — Z6841 Body Mass Index (BMI) 40.0 and over, adult: Secondary | ICD-10-CM

## 2022-06-27 LAB — VITAMIN D 25 HYDROXY (VIT D DEFICIENCY, FRACTURES): Vit D, 25-Hydroxy: 39.8 ng/mL (ref 30.0–100.0)

## 2022-07-05 NOTE — Progress Notes (Signed)
Chief Complaint:   OBESITY Sally [name] is here to discuss her progress with her obesity treatment plan along with follow-up of her obesity related diagnoses. Sally [name] is on the Category 3 Plan and states she is following her eating plan approximately 80% of the time. Sally [name] states she is walking 1 mile 5 times per week.  Today's visit was #: 60 Starting weight: 281 lbs Starting date: 05/14/2018 Today's weight: 244 lbs Today's date: 06/26/2022 Total lbs lost to date: 37 lbs Total lbs lost since last in-office visit: 6  Interim History: Sally [name] had a family gathering for Thanksgiving at her son's house.  Try to focus on [place] at Thanksgiving meal.  Has been on prednisone and has felt more thirsty than she has in the past.  Planning to stay local for December.  Subjective:   1. Type 2 diabetes mellitus with hyperglycemia, without long-term current use of insulin (HCC) Sally [name]'s A1c at 5.7 on October 18 per patient. B12 at 387.  2. Other iron deficiency anemia Sally [name]'s iron level in ferritin level low end of normal on labs done 10/18 reviewed as well as CBC.  She voices she is taking over-the-counter multivitamin.  3. Vitamin D deficiency Sally [name] is on weekly prescription vitamin D. Denies any nausea, vomiting or muscle weakness. She notes fatigue.  Assessment/Plan:   1. Type 2 diabetes mellitus with hyperglycemia, without long-term current use of insulin (HCC) Continue metformin and Ozempic.  2. Other iron deficiency anemia Encouraged to switch to prenatal multivitamin and recheck labs in 3 months.  3. Vitamin D deficiency We will obtain labs today.  - VITAMIN D 25 Hydroxy (Vit-D Deficiency, Fractures)  4. Obesity,current BMI 43.3 Sally [name] is currently in the action stage of change. As such, her goal is to continue with weight loss efforts. She has agreed to the Category 3 Plan.   Exercise goals: All adults should avoid inactivity. Some physical activity is better than none, and  adults who participate in any amount of physical activity gain some health benefits.  Behavioral modification strategies: increasing lean protein intake, meal planning and cooking strategies, keeping healthy foods in the home, holiday eating strategies , and planning for success.  Sally [name] has agreed to follow-up with our clinic in 4 weeks. She was informed of the importance of frequent follow-up visits to maximize her success with intensive lifestyle modifications for her multiple health conditions.   Sally [name] was informed we would discuss her lab results at her next visit unless there is a critical issue that needs to be addressed sooner. Sally [name] agreed to keep her next visit at the agreed upon time to discuss these results.  Objective:   Blood pressure 134/81, pulse 91, temperature 98.3 F (36.8 C), height 5\' 3"  (1.6 m), weight 244 lb (110.7 kg), last menstrual period 05/31/2012, SpO2 100 %. Body mass index is 43.22 kg/m.  General: Cooperative, alert, well developed, in no acute distress. HEENT: Conjunctivae and lids unremarkable. Cardiovascular: Regular rhythm.  Lungs: Normal work of breathing. Neurologic: No focal deficits.   Lab Results  Component Value Date   CREATININE 0.76 10/25/2021   BUN 18 10/25/2021   NA 139 10/25/2021   K 4.8 10/25/2021   CL 101 10/25/2021   CO2 25 10/25/2021   Lab Results  Component Value Date   ALT 13 10/25/2021   AST 16 10/25/2021   ALKPHOS 85 10/25/2021   BILITOT 0.3 10/25/2021   Lab Results  Component Value Date   HGBA1C 5.9 (H) 10/25/2021  HGBA1C 6.0 (H) 06/29/2021   HGBA1C 6.3 (H) 03/07/2021   HGBA1C 5.8 (H) 10/07/2020   HGBA1C 5.8 (H) 01/12/2020   Lab Results  Component Value Date   INSULIN 7.5 10/25/2021   INSULIN 6.4 06/29/2021   INSULIN 7.5 09/02/2019   INSULIN 4.2 04/17/2019   INSULIN 6.6 10/08/2018   Lab Results  Component Value Date   TSH 3.190 09/02/2019   Lab Results  Component Value Date   CHOL 176 06/29/2021    HDL 62 06/29/2021   LDLCALC 82 06/29/2021   TRIG 189 (H) 06/29/2021   CHOLHDL 2.8 06/29/2021   Lab Results  Component Value Date   VD25OH 39.8 06/26/2022   VD25OH 43.3 10/25/2021   VD25OH 31.4 06/29/2021   Lab Results  Component Value Date   WBC 11.4 (H) 11/21/2020   HGB 12.9 12/13/2020   HCT 38.0 12/13/2020   MCV 90.7 11/21/2020   PLT 212 11/21/2020   No results found for: "IRON", "TIBC", "FERRITIN"  Attestation Statements:   Reviewed by clinician on day of visit: allergies, medications, problem list, medical history, surgical history, family history, social history, and previous encounter notes.  I, Elnora Morrison, RMA am acting as transcriptionist for Coralie Common, MD.  I have reviewed the above documentation for accuracy and completeness, and I agree with the above. - Coralie Common, MD

## 2022-07-13 NOTE — Progress Notes (Signed)
TeleHealth Visit:  This visit was completed with telemedicine (audio/video) technology. Sally [name] has verbally consented to this TeleHealth visit. The patient is located at home, the provider is located at home. The participants in this visit include the listed provider and patient. The visit was conducted today via MyChart video.  OBESITY Sally [name] is here to discuss her progress with her obesity treatment plan along with follow-up of her obesity related diagnoses.   Today's visit was # 13 Starting weight: 281 lbs Starting date: 05/14/2018 Weight at last in office visit: 244 lbs on 06/26/22 Total weight loss: 37 lbs at last in office visit on 06/26/22 Today's reported weight:  No weight reported.  Nutrition Plan: the Category 3 Plan.   Current exercise:  walking 1 mile 5 times per week.  Interim History:  Sally [name] is making good progress with weight loss.  She reports good adherence to plan.  Does not feel that Thanksgiving got her off track.  She had lost 6 pounds at last in office visit.  She reports back pain has improved since she lost weight.  Needs to lose to 40 BMI (230 lbs) for left knee replacement.  Knee pain has also improved with weight loss and she is considering not having her knee replaced. Does not always get in all of the food due to early satiety with Ozempic.  However she does have at least some of the protein at every meal.  Appetite and cravings controlled.  Never has second helpings.  Reports having some soda on the weekends.  Assessment/Plan:  1. Vitamin D Deficiency Vitamin D is not at goal of 50.  Last vitamin D level was 39.8 on 06/26/2022. She is on weekly prescription Vitamin D 50,000 IU.  Lab Results  Component Value Date   VD25OH 39.8 06/26/2022   VD25OH 43.3 10/25/2021   VD25OH 31.4 06/29/2021    Plan: Refill prescription vitamin D 50,000 IU weekly.   2. Type II Diabetes HgbA1c is at goal. Last A1c was 5.7 on 05/17/2022. Medication(s): Ozempic  1 mg weekly (obtained through [facility] sent to PCP office).  Metformin XR 500 mg twice daily with meals. Appetite and cravings well-controlled.  Lab Results  Component Value Date   HGBA1C 5.9 (H) 10/25/2021   HGBA1C 6.0 (H) 06/29/2021   HGBA1C 6.3 (H) 03/07/2021   Lab Results  Component Value Date   LDLCALC 82 06/29/2021   CREATININE 0.76 10/25/2021    Plan: Continue Ozempic 1 mg weekly. Refill metformin XR 500 mg twice daily with meals.   3. Obesity: Current BMI 43.3 Sally [name] is currently in the action stage of change. As such, her goal is to continue with weight loss efforts.  She has agreed to the Category 3 Plan.   Reduce soda to once weekly.  Exercise goals: as is  Behavioral modification strategies: increasing lean protein intake, decreasing simple carbohydrates, and planning for success.  Sally [name] has agreed to follow-up with our clinic in 4 weeks.   No orders of the defined types were placed in this encounter.   Medications Discontinued During This Encounter  Medication Reason   Vitamin D, Ergocalciferol, (DRISDOL) 1.25 MG (50000 UNIT) CAPS capsule Reorder   metFORMIN (GLUCOPHAGE-XR) 500 MG 24 hr tablet Reorder   Semaglutide (OZEMPIC, 0.25 OR 0.5 MG/DOSE, [place])      Meds ordered this encounter  Medications   Vitamin D, Ergocalciferol, (DRISDOL) 1.25 MG (50000 UNIT) CAPS capsule    Sig: Take 1 capsule (50,000 Units total) by mouth every 7 (seven)  days.    Dispense:  12 capsule    Refill:  0    Order Specific Question:   Supervising Provider    Answer:   Glennis Brink [[id number]]   metFORMIN (GLUCOPHAGE-XR) 500 MG 24 hr tablet    Sig: Take 1 tablet (500 mg total) by mouth 2 (two) times daily.    Dispense:  180 tablet    Refill:  0    Order Specific Question:   Supervising Provider    Answer:   Glennis Brink [[id number]]      Objective:   VITALS: Per patient if applicable, see vitals. GENERAL: Alert and in no acute distress. CARDIOPULMONARY: No increased WOB.  Speaking in clear sentences.  PSYCH: Pleasant and cooperative. Speech normal rate and rhythm. Affect is appropriate. Insight and judgement are appropriate. Attention is focused, linear, and appropriate.  NEURO: Oriented as arrived to appointment on time with no prompting.   Lab Results  Component Value Date   CREATININE 0.76 10/25/2021   BUN 18 10/25/2021   NA 139 10/25/2021   K 4.8 10/25/2021   CL 101 10/25/2021   CO2 25 10/25/2021   Lab Results  Component Value Date   ALT 13 10/25/2021   AST 16 10/25/2021   ALKPHOS 85 10/25/2021   BILITOT 0.3 10/25/2021   Lab Results  Component Value Date   HGBA1C 5.9 (H) 10/25/2021   HGBA1C 6.0 (H) 06/29/2021   HGBA1C 6.3 (H) 03/07/2021   HGBA1C 5.8 (H) 10/07/2020   HGBA1C 5.8 (H) 01/12/2020   Lab Results  Component Value Date   INSULIN 7.5 10/25/2021   INSULIN 6.4 06/29/2021   INSULIN 7.5 09/02/2019   INSULIN 4.2 04/17/2019   INSULIN 6.6 10/08/2018   Lab Results  Component Value Date   TSH 3.190 09/02/2019   Lab Results  Component Value Date   CHOL 176 06/29/2021   HDL 62 06/29/2021   LDLCALC 82 06/29/2021   TRIG 189 (H) 06/29/2021   CHOLHDL 2.8 06/29/2021   Lab Results  Component Value Date   WBC 11.4 (H) 11/21/2020   HGB 12.9 12/13/2020   HCT 38.0 12/13/2020   MCV 90.7 11/21/2020   PLT 212 11/21/2020   No results found for: "IRON", "TIBC", "FERRITIN" Lab Results  Component Value Date   VD25OH 39.8 06/26/2022   VD25OH 43.3 10/25/2021   VD25OH 31.4 06/29/2021    Attestation Statements:   Reviewed by clinician on day of visit: allergies, medications, problem list, medical history, surgical history, family history, social history, and previous encounter notes.

## 2022-07-17 ENCOUNTER — Encounter (INDEPENDENT_AMBULATORY_CARE_PROVIDER_SITE_OTHER): Payer: Self-pay | Admitting: Family Medicine

## 2022-07-17 ENCOUNTER — Telehealth (INDEPENDENT_AMBULATORY_CARE_PROVIDER_SITE_OTHER): Payer: Medicare Other | Admitting: Family Medicine

## 2022-07-17 DIAGNOSIS — Z7985 Long-term (current) use of injectable non-insulin antidiabetic drugs: Secondary | ICD-10-CM

## 2022-07-17 DIAGNOSIS — Z6841 Body Mass Index (BMI) 40.0 and over, adult: Secondary | ICD-10-CM | POA: Diagnosis not present

## 2022-07-17 DIAGNOSIS — E559 Vitamin D deficiency, unspecified: Secondary | ICD-10-CM

## 2022-07-17 DIAGNOSIS — Z7984 Long term (current) use of oral hypoglycemic drugs: Secondary | ICD-10-CM

## 2022-07-17 DIAGNOSIS — E1169 Type 2 diabetes mellitus with other specified complication: Secondary | ICD-10-CM | POA: Diagnosis not present

## 2022-07-17 DIAGNOSIS — E669 Obesity, unspecified: Secondary | ICD-10-CM

## 2022-07-17 MED ORDER — VITAMIN D (ERGOCALCIFEROL) 1.25 MG (50000 UNIT) PO CAPS: 50000.0000 [IU] | ORAL_CAPSULE | ORAL | 0 refills | Status: DC

## 2022-07-17 MED ORDER — METFORMIN HCL ER 500 MG PO TB24: 500.0000 mg | ORAL_TABLET | Freq: Two times a day (BID) | ORAL | 0 refills | Status: DC

## 2022-08-17 ENCOUNTER — Encounter (INDEPENDENT_AMBULATORY_CARE_PROVIDER_SITE_OTHER): Payer: Self-pay | Admitting: Family Medicine

## 2022-08-17 ENCOUNTER — Ambulatory Visit (INDEPENDENT_AMBULATORY_CARE_PROVIDER_SITE_OTHER): Payer: Medicare HMO | Admitting: Family Medicine

## 2022-08-17 VITALS — BP 133/65 | HR 85 | Temp 97.7°F | Ht 63.0 in | Wt 237.0 lb

## 2022-08-17 DIAGNOSIS — Z6841 Body Mass Index (BMI) 40.0 and over, adult: Secondary | ICD-10-CM | POA: Diagnosis not present

## 2022-08-17 DIAGNOSIS — E559 Vitamin D deficiency, unspecified: Secondary | ICD-10-CM

## 2022-08-17 DIAGNOSIS — E669 Obesity, unspecified: Secondary | ICD-10-CM | POA: Diagnosis not present

## 2022-08-23 NOTE — Progress Notes (Signed)
Chief Complaint:   OBESITY Sally [name] is here to discuss her progress with her obesity treatment plan along with follow-up of her obesity related diagnoses. Sally [name] is on the Category 3 Plan and states she is following her eating plan approximately 70% of the time. Sally [name] states she is going to the gym and walking for 25 to 30 minutes 5 days/week.  Today's visit was #: 64 Starting weight: 281 lbs Starting date: 05/14/2018 Today's weight: 237 lbs Today's date: 08/17/22 Total lbs lost to date: 44 Total lbs lost since last in-office visit: 7  Interim History: Patient stayed local for the holidays.  Has done virtual appointment since end of November.  Getting protein in daily.  Eats a significant amount of chicken daily.  Stayed in control until after the holiday when she felt more down emotionally.  Going out of town toward [facility] next week.  Subjective:   1. Vitamin D deficiency On prescription vitamin D. Last level of 39.8. Positive for fatigue.   Assessment/Plan:   1. Vitamin D deficiency Retest level in 2 months, if not significant increase will switch to cholecalciferol.  2. Obesity,current BMI 42.1  Sally [name] is currently in the action stage of change. As such, her goal is to continue with weight loss efforts. She has agreed to the Category 3 Plan.   Exercise goals: as is  Behavioral modification strategies: increasing lean protein intake, meal planning and cooking strategies, keeping healthy foods in the home, and planning for success.  Sally [name] has agreed to follow-up with our clinic in 4 weeks after next virtual visit with [facility].  Repeat IC fasting. She was informed of the importance of frequent follow-up visits to maximize her success with intensive lifestyle modifications for her multiple health conditions.    Objective:   Blood pressure 133/65, pulse 85, temperature 97.7 F (36.5 C), height 5\' 3"  (1.6 m), weight 237 lb (107.5 kg), last menstrual period 05/31/2012,  SpO2 98 %. Body mass index is 41.98 kg/m.  General: Cooperative, alert, well developed, in no acute distress. HEENT: Conjunctivae and lids unremarkable. Cardiovascular: Regular rhythm.  Lungs: Normal work of breathing. Neurologic: No focal deficits.   Lab Results  Component Value Date   CREATININE 0.76 10/25/2021   BUN 18 10/25/2021   NA 139 10/25/2021   K 4.8 10/25/2021   CL 101 10/25/2021   CO2 25 10/25/2021   Lab Results  Component Value Date   ALT 13 10/25/2021   AST 16 10/25/2021   ALKPHOS 85 10/25/2021   BILITOT 0.3 10/25/2021   Lab Results  Component Value Date   HGBA1C 5.9 (H) 10/25/2021   HGBA1C 6.0 (H) 06/29/2021   HGBA1C 6.3 (H) 03/07/2021   HGBA1C 5.8 (H) 10/07/2020   HGBA1C 5.8 (H) 01/12/2020   Lab Results  Component Value Date   INSULIN 7.5 10/25/2021   INSULIN 6.4 06/29/2021   INSULIN 7.5 09/02/2019   INSULIN 4.2 04/17/2019   INSULIN 6.6 10/08/2018   Lab Results  Component Value Date   TSH 3.190 09/02/2019   Lab Results  Component Value Date   CHOL 176 06/29/2021   HDL 62 06/29/2021   LDLCALC 82 06/29/2021   TRIG 189 (H) 06/29/2021   CHOLHDL 2.8 06/29/2021   Lab Results  Component Value Date   VD25OH 39.8 06/26/2022   VD25OH 43.3 10/25/2021   VD25OH 31.4 06/29/2021   Lab Results  Component Value Date   WBC 11.4 (H) 11/21/2020   HGB 12.9 12/13/2020   HCT  38.0 12/13/2020   MCV 90.7 11/21/2020   PLT 212 11/21/2020   No results found for: "IRON", "TIBC", "FERRITIN"   Attestation Statements:   Reviewed by clinician on day of visit: allergies, medications, problem list, medical history, surgical history, family history, social history, and previous encounter notes.  I, Dawn Whitmire, FNP-C, am acting as Location manager for Coralie Common, MD.   I have reviewed the above documentation for accuracy and completeness, and I agree with the above. - Coralie Common, MD

## 2022-09-13 ENCOUNTER — Other Ambulatory Visit (INDEPENDENT_AMBULATORY_CARE_PROVIDER_SITE_OTHER): Payer: Self-pay | Admitting: Family Medicine

## 2022-09-13 DIAGNOSIS — E559 Vitamin D deficiency, unspecified: Secondary | ICD-10-CM

## 2022-09-13 NOTE — Progress Notes (Deleted)
TeleHealth Visit:  This visit was completed with telemedicine (audio/video) technology. Sally [name] has verbally consented to this TeleHealth visit. The patient is located at home, the provider is located at home. The participants in this visit include the listed provider and patient. The visit was conducted today via MyChart video.  OBESITY Sally [name] is here to discuss her progress with her obesity treatment plan along with follow-up of her obesity related diagnoses.   Today's visit was # 30 Starting weight: 281 lbs Starting date: 05/14/2018 Weight at last in office visit: 237 lbs on 08/17/22 Total weight loss: 44 lbs at last in office visit on 08/17/22. Today's reported weight: *** lbs No weight reported.  Nutrition Plan: the Category 3 plan.  Current exercise: {exercise types:16438} going to the gym and walking for 25 to 30 minutes 5 days/week.  Interim History:  Sally [name] lost 7 lbs at her last in office visit.  Assessment/Plan:  1. ***  2. ***  3. ***  Obesity: Current BMI *** Sally [name] {CHL AMB IS/IS NOT:210130109} currently in the action stage of change. As such, her goal is to {MWMwtloss#1:210800005}.  She has agreed to {dwwsldiets:29085}.  Exercise goals: {MWM EXERCISE RECS:23473}  Behavioral modification strategies: {dwwslwtlossstrategies:29088}.  Sally [name] has agreed to follow-up with our clinic in {NUMBER 1-10:22536} weeks.   No orders of the defined types were placed in this encounter.   There are no discontinued medications.   No orders of the defined types were placed in this encounter.     Objective:   VITALS: Per patient if applicable, see vitals. GENERAL: Alert and in no acute distress. CARDIOPULMONARY: No increased WOB. Speaking in clear sentences.  PSYCH: Pleasant and cooperative. Speech normal rate and rhythm. Affect is appropriate. Insight and judgement are appropriate. Attention is focused, linear, and appropriate.  NEURO: Oriented as arrived to  appointment on time with no prompting.   Lab Results  Component Value Date   CREATININE 0.76 10/25/2021   BUN 18 10/25/2021   NA 139 10/25/2021   K 4.8 10/25/2021   CL 101 10/25/2021   CO2 25 10/25/2021   Lab Results  Component Value Date   ALT 13 10/25/2021   AST 16 10/25/2021   ALKPHOS 85 10/25/2021   BILITOT 0.3 10/25/2021   Lab Results  Component Value Date   HGBA1C 5.9 (H) 10/25/2021   HGBA1C 6.0 (H) 06/29/2021   HGBA1C 6.3 (H) 03/07/2021   HGBA1C 5.8 (H) 10/07/2020   HGBA1C 5.8 (H) 01/12/2020   Lab Results  Component Value Date   INSULIN 7.5 10/25/2021   INSULIN 6.4 06/29/2021   INSULIN 7.5 09/02/2019   INSULIN 4.2 04/17/2019   INSULIN 6.6 10/08/2018   Lab Results  Component Value Date   TSH 3.190 09/02/2019   Lab Results  Component Value Date   CHOL 176 06/29/2021   HDL 62 06/29/2021   LDLCALC 82 06/29/2021   TRIG 189 (H) 06/29/2021   CHOLHDL 2.8 06/29/2021   Lab Results  Component Value Date   WBC 11.4 (H) 11/21/2020   HGB 12.9 12/13/2020   HCT 38.0 12/13/2020   MCV 90.7 11/21/2020   PLT 212 11/21/2020   No results found for: "IRON", "TIBC", "FERRITIN" Lab Results  Component Value Date   VD25OH 39.8 06/26/2022   VD25OH 43.3 10/25/2021   VD25OH 31.4 06/29/2021    Attestation Statements:   Reviewed by clinician on day of visit: allergies, medications, problem list, medical history, surgical history, family history, social history, and previous encounter notes.  ***(delete if  time-based billing not used) Time spent on visit including the items listed below was *** minutes.  -preparing to see the patient (e.g., review of tests, history, previous notes) -obtaining and/or reviewing separately obtained history -counseling and educating the patient/family/caregiver -documenting clinical information in the electronic or other health record -ordering medications, tests, or procedures -independently interpreting results and communicating results  to the patient/ family/caregiver -referring and communicating with other health care professionals  -care coordination   This was prepared with the assistance of [employer].  Occasional wrong-word or sound-a-like substitutions may have occurred due to the inherent limitations of voice recognition software.

## 2022-09-14 ENCOUNTER — Telehealth (INDEPENDENT_AMBULATORY_CARE_PROVIDER_SITE_OTHER): Payer: Self-pay | Admitting: Family Medicine

## 2022-09-14 ENCOUNTER — Telehealth (INDEPENDENT_AMBULATORY_CARE_PROVIDER_SITE_OTHER): Payer: Medicare Other | Admitting: Family Medicine

## 2022-09-14 DIAGNOSIS — Z6841 Body Mass Index (BMI) 40.0 and over, adult: Secondary | ICD-10-CM

## 2022-09-14 NOTE — Telephone Encounter (Signed)
Patient was a no-show for virtual visit. Left VM advising patient to call to reschedule.

## 2022-09-15 ENCOUNTER — Encounter (INDEPENDENT_AMBULATORY_CARE_PROVIDER_SITE_OTHER): Payer: Self-pay | Admitting: Family Medicine

## 2022-09-18 ENCOUNTER — Encounter (INDEPENDENT_AMBULATORY_CARE_PROVIDER_SITE_OTHER): Payer: Self-pay | Admitting: Family Medicine

## 2022-10-12 ENCOUNTER — Ambulatory Visit (INDEPENDENT_AMBULATORY_CARE_PROVIDER_SITE_OTHER): Payer: Medicare HMO | Admitting: Family Medicine

## 2022-10-12 ENCOUNTER — Encounter (INDEPENDENT_AMBULATORY_CARE_PROVIDER_SITE_OTHER): Payer: Self-pay | Admitting: Family Medicine

## 2022-10-12 VITALS — BP 115/73 | HR 65 | Temp 97.9°F | Ht 63.0 in | Wt 235.0 lb

## 2022-10-12 DIAGNOSIS — E669 Obesity, unspecified: Secondary | ICD-10-CM

## 2022-10-12 DIAGNOSIS — Z7984 Long term (current) use of oral hypoglycemic drugs: Secondary | ICD-10-CM

## 2022-10-12 DIAGNOSIS — R0602 Shortness of breath: Secondary | ICD-10-CM

## 2022-10-12 DIAGNOSIS — E538 Deficiency of other specified B group vitamins: Secondary | ICD-10-CM | POA: Diagnosis not present

## 2022-10-12 DIAGNOSIS — E1169 Type 2 diabetes mellitus with other specified complication: Secondary | ICD-10-CM

## 2022-10-12 DIAGNOSIS — Z6841 Body Mass Index (BMI) 40.0 and over, adult: Secondary | ICD-10-CM

## 2022-10-12 DIAGNOSIS — E559 Vitamin D deficiency, unspecified: Secondary | ICD-10-CM | POA: Diagnosis not present

## 2022-10-12 MED ORDER — VITAMIN D (ERGOCALCIFEROL) 1.25 MG (50000 UNIT) PO CAPS: 50000.0000 [IU] | ORAL_CAPSULE | ORAL | 0 refills | Status: DC

## 2022-10-12 MED ORDER — METFORMIN HCL ER 500 MG PO TB24: 500.0000 mg | ORAL_TABLET | Freq: Two times a day (BID) | ORAL | 0 refills | Status: DC

## 2022-10-12 NOTE — Progress Notes (Signed)
Chief Complaint:   OBESITY Sally [name] is here to discuss her progress with her obesity treatment plan along with follow-up of her obesity related diagnoses. Sally [name] is on the Category 3 Plan and states she is following her eating plan approximately 50% of the time. Sally [name] states she is walking more.  Today's visit was #: 9 Starting weight: 281lbs Starting date: 05/14/2018 Today's weight: 235 lbs Today's date: 10/12/2022 Total lbs lost to date: 46 Total lbs lost since last in-office visit: 2  Interim History: Since last in office appointment she has been mostly working. She unfortunately couldn't connect with Sally [name] for a virtual visit at the time of the last office appointment.  Foodwise she has been trying to stick with Cat 3.  She is eating quite a bit of chicken, Sally [name] and focusing on water intake.  Sometimes she feels a craving and hunger and gravitates towards comfort food.  Comfort food for her is melted cheese, pizza, pasta.  She is recognizing that her major coping mechanism is food and that her other coping mechanisms are playing with the kids or walking her dogs. Wants to sign up at [employer] in the next few weeks.  Subjective:   1. SOBOE (shortness of breath on exertion) Sally [name]'s initial IC was 2061 on 05/14/18. Her symptoms were similar at that time.  2. Vitamin D deficiency Sally [name]'s last vitamin D level was in November 2023. She is on prescription vitamin D.  3. Type 2 diabetes mellitus with other specified complication, without long-term current use of insulin (HCC) Sally [name]'s last A1c was well controlled at 5.9. She is on metformin and Ozempic.  4. Vitamin B12 deficiency Sally [name] is on OTC B12. She is also on Enbrel and metformin, both of which can decrease B12.  5. BMI 40.0-44.9, adult (HCC)  Assessment/Plan:   1. SOBOE (shortness of breath on exertion) Sally [name]'s IC today was 1627, which has decreased since her initial appointment. She was encouraged to add in  more consistent resistant training.  2. Vitamin D deficiency Sally [name] agrees to continue to take prescription vitamin D. A vitamin D level was drawn today.  - Vitamin D, Ergocalciferol, (DRISDOL) 1.25 MG (50000 UNIT) CAPS capsule; Take 1 capsule (50,000 Units total) by mouth every 7 (seven) days.  Dispense: 12 capsule; Refill: 0 - VITAMIN D 25 Hydroxy (Vit-D Deficiency, Fractures)  3. Type 2 diabetes mellitus with other specified complication, without long-term current use of insulin (HCC) Sally [name] agrees to continue to take metformin and follow up as directed. Labs were drawn today.  - metFORMIN (GLUCOPHAGE-XR) 500 MG 24 hr tablet; Take 1 tablet (500 mg total) by mouth 2 (two) times daily.  Dispense: 180 tablet; Refill: 0 - Comprehensive metabolic panel - Hemoglobin A1c - Insulin, random - Lipid Panel With LDL/HDL Ratio  4. Vitamin B12 deficiency A B12 level was drawn today and Sally [name] agrees to follow up as directed.  - Vitamin B12  5. BMI 40.0-44.9, adult (Sally [name])  6. Obesity with starting BMI of 41.7  Sally [name] is currently in the action stage of change. As such, her goal is to continue with weight loss efforts. She has agreed to the Category 3 Plan.   Exercise goals:  Sally [name] agrees t sign up at Sally [name] by the next appointment.  Behavioral modification strategies: increasing lean protein intake, meal planning and cooking strategies, keeping healthy foods in the home, and planning for success.  Sally [name] has agreed to follow-up with our clinic in 4 weeks. She  was informed of the importance of frequent follow-up visits to maximize her success with intensive lifestyle modifications for her multiple health conditions.   Sally [name] was informed we would discuss her lab results at her next visit unless there is a critical issue that needs to be addressed sooner. Sally [name] agreed to keep her next visit at the agreed upon time to discuss these results.  Objective:   Blood pressure 115/73, pulse  65, temperature 97.9 F (36.6 C), height '5\' 3"'$  (1.6 m), weight 235 lb (106.6 kg), last menstrual period 05/31/2012, SpO2 100 %. Body mass index is 41.63 kg/m.  General: Cooperative, alert, well developed, in no acute distress. HEENT: Conjunctivae and lids unremarkable. Cardiovascular: Regular rhythm.  Lungs: Normal work of breathing. Neurologic: No focal deficits.   Lab Results  Component Value Date   CREATININE 0.76 10/25/2021   BUN 18 10/25/2021   NA 139 10/25/2021   K 4.8 10/25/2021   CL 101 10/25/2021   CO2 25 10/25/2021   Lab Results  Component Value Date   ALT 13 10/25/2021   AST 16 10/25/2021   ALKPHOS 85 10/25/2021   BILITOT 0.3 10/25/2021   Lab Results  Component Value Date   HGBA1C 5.9 (H) 10/25/2021   HGBA1C 6.0 (H) 06/29/2021   HGBA1C 6.3 (H) 03/07/2021   HGBA1C 5.8 (H) 10/07/2020   HGBA1C 5.8 (H) 01/12/2020   Lab Results  Component Value Date   INSULIN 7.5 10/25/2021   INSULIN 6.4 06/29/2021   INSULIN 7.5 09/02/2019   INSULIN 4.2 04/17/2019   INSULIN 6.6 10/08/2018   Lab Results  Component Value Date   TSH 3.190 09/02/2019   Lab Results  Component Value Date   CHOL 176 06/29/2021   HDL 62 06/29/2021   LDLCALC 82 06/29/2021   TRIG 189 (H) 06/29/2021   CHOLHDL 2.8 06/29/2021   Lab Results  Component Value Date   VD25OH 39.8 06/26/2022   VD25OH 43.3 10/25/2021   VD25OH 31.4 06/29/2021   Lab Results  Component Value Date   WBC 11.4 (H) 11/21/2020   HGB 12.9 12/13/2020   HCT 38.0 12/13/2020   MCV 90.7 11/21/2020   PLT 212 11/21/2020   No results found for: "IRON", "TIBC", "FERRITIN"  Obesity Behavioral Intervention:   Approximately 15 minutes were spent on the discussion below.  ASK: We discussed the diagnosis of obesity with Sally [name] today and Sally [name] agreed to give [name] permission to discuss obesity behavioral modification therapy today.   ASSESS: Sally [name] has the diagnosis of obesity and her BMI today is 41.7. Sally [name] is in the action  stage of change.   ADVISE: Sally [name] was educated on the multiple health risks of obesity as well as the benefit of weight loss to improve her health. She was advised of the need for long term treatment and the importance of lifestyle modifications to improve her current health and to decrease her risk of future health problems.  AGREE: Multiple dietary modification options and treatment options were discussed and Armony agreed to follow the recommendations documented in the above note.  ARRANGE: Genara was educated on the importance of frequent visits to treat obesity as outlined per CMS and USPSTF guidelines and agreed to schedule her next follow up appointment today.  Attestation Statements:   Reviewed by clinician on day of visit: allergies, medications, problem list, medical history, surgical history, family history, social history, and previous encounter notes.  IMarcille Blanco, CMA, am acting as transcriptionist for Coralie Common, MD  I have reviewed the  above documentation for accuracy and completeness, and I agree with the above. - Coralie Common, MD

## 2022-10-13 LAB — LIPID PANEL WITH LDL/HDL RATIO
Cholesterol, Total: 171 mg/dL (ref 100–199)
HDL: 74 mg/dL (ref >39)
LDL Chol Calc (NIH): 82 mg/dL (ref 0–99)
LDL/HDL Ratio: 1.1 ratio (ref 0.0–3.2)
Triglycerides: 79 mg/dL (ref 0–149)
VLDL Cholesterol Cal: 15 mg/dL (ref 5–40)

## 2022-10-13 LAB — VITAMIN B12: Vitamin B-12: 393 pg/mL (ref 232–1245)

## 2022-10-13 LAB — HEMOGLOBIN A1C
Est. average glucose Bld gHb Est-mCnc: 126 mg/dL
Hgb A1c MFr Bld: 6 % — ABNORMAL HIGH (ref 4.8–5.6)

## 2022-10-13 LAB — COMPREHENSIVE METABOLIC PANEL
ALT: 16 IU/L (ref 0–32)
AST: 12 IU/L (ref 0–40)
Albumin/Globulin Ratio: 1.5 (ref 1.2–2.2)
Albumin: 4.4 g/dL (ref 3.9–4.9)
Alkaline Phosphatase: 87 IU/L (ref 44–121)
BUN/Creatinine Ratio: 23 (ref 12–28)
BUN: 18 mg/dL (ref 8–27)
Bilirubin Total: 0.3 mg/dL (ref 0.0–1.2)
CO2: 24 mmol/L (ref 20–29)
Calcium: 9.3 mg/dL (ref 8.7–10.3)
Chloride: 102 mmol/L (ref 96–106)
Creatinine, Ser: 0.78 mg/dL (ref 0.57–1.00)
Globulin, Total: 3 g/dL (ref 1.5–4.5)
Glucose: 87 mg/dL (ref 70–99)
Potassium: 4.8 mmol/L (ref 3.5–5.2)
Sodium: 141 mmol/L (ref 134–144)
Total Protein: 7.4 g/dL (ref 6.0–8.5)
eGFR: 85 mL/min/{1.73_m2} (ref >59)

## 2022-10-13 LAB — VITAMIN D 25 HYDROXY (VIT D DEFICIENCY, FRACTURES): Vit D, 25-Hydroxy: 51.2 ng/mL (ref 30.0–100.0)

## 2022-10-13 LAB — INSULIN, RANDOM: INSULIN: 3.4 u[IU]/mL (ref 2.6–24.9)

## 2022-11-07 NOTE — Progress Notes (Signed)
TeleHealth Visit:  This visit was completed with telemedicine (audio/video) technology. Sally [name] has verbally consented to this TeleHealth visit. The patient is located at home, the provider is located at home. The participants in this visit include the listed provider and patient. The visit was conducted today via MyChart video.  OBESITY [name] is here to discuss her progress with her obesity treatment plan along with follow-up of her obesity related diagnoses.   Today's visit was # 64 Starting weight: 281lbs Starting date: 05/14/2018 Weight at last in office visit: 235 lbs on 10/12/22 Total weight loss: 46 lbs at last in office visit on 10/12/22. Today's reported weight (11/09/22): none reported  Nutrition Plan: the Category 3 plan   Current exercise:  none  Interim History:  She has been on prednisone for an asthma exacerbation. She has not been on plan well due to traveling a lot recently. She has been drinking too many sodas but stopped 3 days ago. She does not like diet soda. Protein intake is good. Reports higher carb intake and eating out more.   She may be traveling to a wedding in the next month. Reports a lot of hunger recently.  She started at [employer]. She has been 4 times.  Eating all of the prescribed protein: yes Drinking adequate water: Yes Drinking sugar sweetened beverages: Yes Hunger controlled: poorly controlled. Cravings controlled:  poorly controlled.  Assessment/Plan:  We discussed recent lab results in depth.  1. Type 2 Diabetes Mellitus with other specified complication, without long-term current use of insulin HgbA1c is at goal. Last A1c was 6.0. She has not had Ozempic since December due to some red tape. She will call to check on this. Notes increased hunger, likely due to lack of Ozempic and eating off plan. CBGs: Not checking Episodes of hypoglycemia: no Medication(s):  Metformin XR twice daily.  Lab Results  Component Value Date    HGBA1C 6.0 (H) 10/12/2022   HGBA1C 5.9 (H) 10/25/2021   HGBA1C 6.0 (H) 06/29/2021   Lab Results  Component Value Date   LDLCALC 82 10/12/2022   CREATININE 0.78 10/12/2022   No results found for: "GFR"  Plan: Continue metformin. Call PCP to see if she can straighten out the problem with obtaining the Ozempic.   2. Gastroesophageal reflux disease without esophagitis Symptoms are well-controlled as long as she takes omeprazole and famotidine at night. Medication: Omeprazole 40 mg daily, famotidine 20 mg at night.  Pulmonology feels that GERD contributes to her cough.  Plan: Continue famotidine nightly. Refill omeprazole 40 mg daily.  3. Eating disorder/emotional eating Sally [name] has had issues with stress/emotional eating. Currently this is poorly controlled. Overall mood is stable. Medication(s): Bupropion XL 150 mg daily, Cymbalta 30 mg daily  Plan: Continue and refill bupropion XL 150 mg daily. Continue Cymbalta 30 mg daily.   4. Morbid Obesity: Current BMI 41  Sally [name] is currently in the action stage of change. As such, her goal is to continue with weight loss efforts.  She has agreed to the Category 3 plan.  Exercise goals: Continue to regularly go to [employer].  Behavioral modification strategies: increasing lean protein intake, decreasing simple carbohydrates , decrease eating out, decrease liquid calories, better snacking choices, and planning for success.  Sally [name] has agreed to follow-up with our clinic in 5 weeks.   No orders of the defined types were placed in this encounter.   Medications Discontinued During This Encounter  Medication Reason   omeprazole (PRILOSEC) 40 MG capsule Reorder  buPROPion (WELLBUTRIN XL) 150 MG 24 hr tablet Reorder     Meds ordered this encounter  Medications   buPROPion (WELLBUTRIN XL) 150 MG 24 hr tablet    Sig: Take 1 tablet (150 mg total) by mouth every morning.    Dispense:  90 tablet    Refill:  0    Order Specific  Question:   Supervising Provider    Answer:   Glennis Brink [[id number]]   omeprazole (PRILOSEC) 40 MG capsule    Sig: Take 1 capsule (40 mg total) by mouth daily.    Dispense:  90 capsule    Refill:  1    Order Specific Question:   Supervising Provider    Answer:   Glennis Brink [[id number]]      Objective:   VITALS: Per patient if applicable, see vitals. GENERAL: Alert and in no acute distress. CARDIOPULMONARY: No increased WOB. Speaking in clear sentences.  PSYCH: Pleasant and cooperative. Speech normal rate and rhythm. Affect is appropriate. Insight and judgement are appropriate. Attention is focused, linear, and appropriate.  NEURO: Oriented as arrived to appointment on time with no prompting.   Attestation Statements:   Reviewed by clinician on day of visit: allergies, medications, problem list, medical history, surgical history, family history, social history, and previous encounter notes.   This was prepared with the assistance of Engineer, civil (consulting).  Occasional wrong-word or sound-a-like substitutions may have occurred due to the inherent limitations of voice recognition software.

## 2022-11-09 ENCOUNTER — Encounter (INDEPENDENT_AMBULATORY_CARE_PROVIDER_SITE_OTHER): Payer: Self-pay | Admitting: Family Medicine

## 2022-11-09 ENCOUNTER — Other Ambulatory Visit (INDEPENDENT_AMBULATORY_CARE_PROVIDER_SITE_OTHER): Payer: Self-pay | Admitting: Family Medicine

## 2022-11-09 ENCOUNTER — Telehealth (INDEPENDENT_AMBULATORY_CARE_PROVIDER_SITE_OTHER): Payer: Medicare HMO | Admitting: Family Medicine

## 2022-11-09 DIAGNOSIS — F5089 Other specified eating disorder: Secondary | ICD-10-CM

## 2022-11-09 DIAGNOSIS — E1169 Type 2 diabetes mellitus with other specified complication: Secondary | ICD-10-CM

## 2022-11-09 DIAGNOSIS — E538 Deficiency of other specified B group vitamins: Secondary | ICD-10-CM

## 2022-11-09 DIAGNOSIS — K219 Gastro-esophageal reflux disease without esophagitis: Secondary | ICD-10-CM | POA: Insufficient documentation

## 2022-11-09 DIAGNOSIS — Z6841 Body Mass Index (BMI) 40.0 and over, adult: Secondary | ICD-10-CM

## 2022-11-09 MED ORDER — OMEPRAZOLE 40 MG PO CPDR: 40.0000 mg | DELAYED_RELEASE_CAPSULE | Freq: Every day | ORAL | 1 refills | Status: DC

## 2022-11-09 MED ORDER — BUPROPION HCL ER (XL) 150 MG PO TB24: 150.0000 mg | ORAL_TABLET | Freq: Every morning | ORAL | 0 refills | Status: DC

## 2022-11-09 MED ORDER — VITAMIN B-12 1000 MCG PO TABS: 1000.0000 ug | ORAL_TABLET | Freq: Every day | ORAL | 1 refills | Status: AC

## 2022-11-20 ENCOUNTER — Telehealth (INDEPENDENT_AMBULATORY_CARE_PROVIDER_SITE_OTHER): Payer: Self-pay | Admitting: Family Medicine

## 2022-11-20 NOTE — Telephone Encounter (Signed)
Spoke with [employer], advised patient is on the 40 mg Omeprazole.

## 2022-11-20 NOTE — Telephone Encounter (Signed)
[employer] called and stated they need clarification on "duplicate of therapy". The medication is Omeprazole. Reference # [id number]. Call back number is [phone]

## 2022-12-11 ENCOUNTER — Ambulatory Visit (INDEPENDENT_AMBULATORY_CARE_PROVIDER_SITE_OTHER): Payer: Medicare HMO | Admitting: Family Medicine

## 2022-12-11 ENCOUNTER — Encounter (INDEPENDENT_AMBULATORY_CARE_PROVIDER_SITE_OTHER): Payer: Self-pay | Admitting: Family Medicine

## 2022-12-11 VITALS — BP 137/76 | HR 64 | Temp 98.0°F | Ht 63.0 in | Wt 246.0 lb

## 2022-12-11 DIAGNOSIS — Z6841 Body Mass Index (BMI) 40.0 and over, adult: Secondary | ICD-10-CM | POA: Diagnosis not present

## 2022-12-11 DIAGNOSIS — Z7985 Long-term (current) use of injectable non-insulin antidiabetic drugs: Secondary | ICD-10-CM

## 2022-12-11 DIAGNOSIS — E1165 Type 2 diabetes mellitus with hyperglycemia: Secondary | ICD-10-CM

## 2022-12-11 DIAGNOSIS — Z7984 Long term (current) use of oral hypoglycemic drugs: Secondary | ICD-10-CM

## 2022-12-11 DIAGNOSIS — E559 Vitamin D deficiency, unspecified: Secondary | ICD-10-CM

## 2022-12-11 DIAGNOSIS — E669 Obesity, unspecified: Secondary | ICD-10-CM

## 2022-12-11 NOTE — Progress Notes (Signed)
Chief Complaint:   OBESITY Sally [name] is here to discuss her progress with her obesity treatment plan along with follow-up of her obesity related diagnoses. Sally [name] is on the Category 3 Plan and states she is following her eating plan approximately 50% of the time. Sally [name] states she is at the gym and bike riding for 30-60 minutes 3 times per week.  Today's visit was #: 65 Starting weight: 281 lbs Starting date: 05/14/2018 Today's weight: 246 lbs Today's date: 12/11/2022 Total lbs lost to date: 35 Total lbs lost since last in-office visit: 0  Interim History: Patient voices since the last appointment she has been on and off prednisone for her asthma.  She then broke a rib from a fall.  She feels disgusted and frustrated with her weight gain.  She mentions she has felt more hungry with the recurrent prednisone usage.  She isn't aware of how much she eats when on prednisone.  Feeling more down and thinks she has done some emotional eating.  Has started going to the gym and is trying to add in a bit at a time.  She is planning to go out while at the beach in the next few weeks.  She mentions that she will just have to be mindful.   Subjective:   1. Type 2 diabetes mellitus with hyperglycemia, without long-term current use of insulin (HCC) Sally [name] is on Ozempic weekly and metformin daily.  Her last A1c was 6.0.  She was off Ozempic since December.  2. Vitamin D deficiency Sally [name]'s last vitamin D level was of 51.2.  She denies nausea, vomiting, or muscle weakness, but she notes fatigue.  Assessment/Plan:   1. Type 2 diabetes mellitus with hyperglycemia, without long-term current use of insulin (HCC) Sally [name] will continue her current medications with no change in dose.  2. Vitamin D deficiency Sally [name] will continue vitamin D, as her level is close to goal but not yet at goal.  3. BMI 40.0-44.9, adult (HCC)  4. Obesity with starting BMI of 48.2 Sally [name] is currently in the action stage of  change. As such, her goal is to continue with weight loss efforts. She has agreed to the Category 3 Plan.   Exercise goals: As is.   Behavioral modification strategies: increasing lean protein intake, meal planning and cooking strategies, keeping healthy foods in the home, and planning for success.  Sally [name] has agreed to follow-up with our clinic in 3 to 4 weeks. She was informed of the importance of frequent follow-up visits to maximize her success with intensive lifestyle modifications for her multiple health conditions.   Objective:   Blood pressure 137/76, pulse 64, temperature 98 F (36.7 C), height 5\' 3"  (1.6 m), weight 246 lb (111.6 kg), last menstrual period 05/31/2012, SpO2 100 %. Body mass index is 43.58 kg/m.  General: Cooperative, alert, well developed, in no acute distress. HEENT: Conjunctivae and lids unremarkable. Cardiovascular: Regular rhythm.  Lungs: Normal work of breathing. Neurologic: No focal deficits.   Lab Results  Component Value Date   CREATININE 0.78 10/12/2022   BUN 18 10/12/2022   NA 141 10/12/2022   K 4.8 10/12/2022   CL 102 10/12/2022   CO2 24 10/12/2022   Lab Results  Component Value Date   ALT 16 10/12/2022   AST 12 10/12/2022   ALKPHOS 87 10/12/2022   BILITOT 0.3 10/12/2022   Lab Results  Component Value Date   HGBA1C 6.0 (H) 10/12/2022   HGBA1C 5.9 (H) 10/25/2021   HGBA1C  6.0 (H) 06/29/2021   HGBA1C 6.3 (H) 03/07/2021   HGBA1C 5.8 (H) 10/07/2020   Lab Results  Component Value Date   INSULIN 3.4 10/12/2022   INSULIN 7.5 10/25/2021   INSULIN 6.4 06/29/2021   INSULIN 7.5 09/02/2019   INSULIN 4.2 04/17/2019   Lab Results  Component Value Date   TSH 3.190 09/02/2019   Lab Results  Component Value Date   CHOL 171 10/12/2022   HDL 74 10/12/2022   LDLCALC 82 10/12/2022   TRIG 79 10/12/2022   CHOLHDL 2.8 06/29/2021   Lab Results  Component Value Date   VD25OH 51.2 10/12/2022   VD25OH 39.8 06/26/2022   VD25OH 43.3 10/25/2021    Lab Results  Component Value Date   WBC 11.4 (H) 11/21/2020   HGB 12.9 12/13/2020   HCT 38.0 12/13/2020   MCV 90.7 11/21/2020   PLT 212 11/21/2020   No results found for: "IRON", "TIBC", "FERRITIN"  Attestation Statements:   Reviewed by clinician on day of visit: allergies, medications, problem list, medical history, surgical history, family history, social history, and previous encounter notes.  Time spent on visit including pre-visit chart review and post-visit care and charting was 28 minutes.   I, Burt Knack, am acting as transcriptionist for [name], MD. I have reviewed the above documentation for accuracy and completeness, and I agree with the above. - [name], MD

## 2023-01-04 NOTE — Progress Notes (Signed)
TeleHealth Visit:  This visit was completed with telemedicine (audio/video) technology. Sebrenia has verbally consented to this TeleHealth visit. The patient is located at home, the provider is located at home. The participants in this visit include the listed provider and patient. The visit was conducted today via MyChart video.  OBESITY Sally [name] is here to discuss her progress with her obesity treatment plan along with follow-up of her obesity related diagnoses.   Today's visit was # 66 Starting weight: 281 lbs Starting date: 05/14/2018 Weight at last in office visit: 246 lbs on 12/11/22 Total weight loss: 35 lbs at last in office visit on 12/11/22. Today's reported weight (01/08/23): none reported  Nutrition Plan: the Category 3 plan - 85% adherence.  Current exercise: none.  Interim History:  She recently took a trip to [place] and had a great time.  She is still having some soreness from her fall several weeks ago, Rib pain has improved. She has lost a few lbs and is doing better with the meal plan. She had gained 9 lbs last OV. She is not eating much for lunch due to going to daughter-in-laws to help with twins at lunch.  Has not been going to the gym due to her recent fall and lack of time.  l  Has been less hungry recently due to restarting Ozempic in May. Had not had it since December.  Assessment/Plan:  1. Type 2 Diabetes Mellitus with other specified complication, without long-term current use of insulin HgbA1c is at goal. Last A1c was 6.0. CBGs: Not checking Medication(s): Ozempic 1 mg SQ weekly and metformin XR 500 mg twice daily.  Restarted Ozempic in May after being off of it for 5 months.  Noted she was less hungry but had not correlated to restarting the Ozempic. Lab Results  Component Value Date   HGBA1C 6.0 (H) 10/12/2022   HGBA1C 5.9 (H) 10/25/2021   HGBA1C 6.0 (H) 06/29/2021   Lab Results  Component Value Date   LDLCALC 82 10/12/2022   CREATININE  0.78 10/12/2022   No results found for: "GFR"  Plan: Continue Ozempic 1 mg weekly. Continue metformin XR 500 mg twice daily.   2. Vitamin D Deficiency Vitamin D is at goal of 50.  Most recent vitamin D level was 51 on 10/12/2022.[name] She is on  prescription ergocalciferol 50,000 IU weekly. Lab Results  Component Value Date   VD25OH 51.2 10/12/2022   VD25OH 39.8 06/26/2022   VD25OH 43.3 10/25/2021    Plan: Continue and refill  prescription ergocalciferol 50,000 IU weekly   3. Morbid Obesity: Current BMI 43  [name] is currently in the action stage of change. As such, her goal is to continue with weight loss efforts.  She has agreed to the Category 3 plan.  1. Pack lunch to eat on the way to her daughter-in-laws house. Exercise goals: She will start going back to [employer] before next office visit.  Behavioral modification strategies: increasing lean protein intake, decreasing simple carbohydrates , decrease eating out, and planning for success.  [name] has agreed to follow-up with our clinic in 5 weeks.  No orders of the defined types were placed in this encounter.   Medications Discontinued During This Encounter  Medication Reason   Vitamin D, Ergocalciferol, (DRISDOL) 1.25 MG (50000 UNIT) CAPS capsule Reorder     Meds ordered this encounter  Medications   Vitamin D, Ergocalciferol, (DRISDOL) 1.25 MG (50000 UNIT) CAPS capsule    Sig: Take 1 capsule (50,000 Units total) by  mouth every 7 (seven) days.    Dispense:  12 capsule    Refill:  0    Order Specific Question:   Supervising Provider    Answer:   Glennis Brink [[id number]]      Objective:   VITALS: Per patient if applicable, see vitals. GENERAL: Alert and in no acute distress. CARDIOPULMONARY: No increased WOB. Speaking in clear sentences.  PSYCH: Pleasant and cooperative. Speech normal rate and rhythm. Affect is appropriate. Insight and judgement are appropriate. Attention is focused, linear, and appropriate.   NEURO: Oriented as arrived to appointment on time with no prompting.   Attestation Statements:   Reviewed by clinician on day of visit: allergies, medications, problem list, medical history, surgical history, family history, social history, and previous encounter notes.   This was prepared with the assistance of Engineer, civil (consulting).  Occasional wrong-word or sound-a-like substitutions may have occurred due to the inherent limitations of voice recognition software.

## 2023-01-08 ENCOUNTER — Encounter (INDEPENDENT_AMBULATORY_CARE_PROVIDER_SITE_OTHER): Payer: Self-pay | Admitting: Family Medicine

## 2023-01-08 ENCOUNTER — Telehealth (INDEPENDENT_AMBULATORY_CARE_PROVIDER_SITE_OTHER): Payer: Medicare HMO | Admitting: Family Medicine

## 2023-01-08 DIAGNOSIS — Z6841 Body Mass Index (BMI) 40.0 and over, adult: Secondary | ICD-10-CM | POA: Diagnosis not present

## 2023-01-08 DIAGNOSIS — E559 Vitamin D deficiency, unspecified: Secondary | ICD-10-CM

## 2023-01-08 DIAGNOSIS — Z7985 Long-term (current) use of injectable non-insulin antidiabetic drugs: Secondary | ICD-10-CM

## 2023-01-08 DIAGNOSIS — Z7984 Long term (current) use of oral hypoglycemic drugs: Secondary | ICD-10-CM

## 2023-01-08 DIAGNOSIS — E1169 Type 2 diabetes mellitus with other specified complication: Secondary | ICD-10-CM | POA: Diagnosis not present

## 2023-01-08 MED ORDER — VITAMIN D (ERGOCALCIFEROL) 1.25 MG (50000 UNIT) PO CAPS: 50000.0000 [IU] | ORAL_CAPSULE | ORAL | 0 refills | Status: DC

## 2023-01-18 ENCOUNTER — Other Ambulatory Visit (INDEPENDENT_AMBULATORY_CARE_PROVIDER_SITE_OTHER): Payer: Self-pay | Admitting: Family Medicine

## 2023-01-18 DIAGNOSIS — E1169 Type 2 diabetes mellitus with other specified complication: Secondary | ICD-10-CM

## 2023-02-05 ENCOUNTER — Ambulatory Visit (INDEPENDENT_AMBULATORY_CARE_PROVIDER_SITE_OTHER): Payer: Medicare HMO | Admitting: Family Medicine

## 2023-02-12 ENCOUNTER — Ambulatory Visit (INDEPENDENT_AMBULATORY_CARE_PROVIDER_SITE_OTHER): Payer: Medicare HMO | Admitting: Family Medicine

## 2023-02-15 ENCOUNTER — Ambulatory Visit: Payer: Medicare HMO

## 2023-02-15 ENCOUNTER — Ambulatory Visit
Admission: EM | Admit: 2023-02-15 | Discharge: 2023-02-15 | Disposition: A | Discharge status: Home / Self Care | Payer: Medicare HMO | Attending: Internal Medicine | Admitting: Internal Medicine

## 2023-02-15 ENCOUNTER — Other Ambulatory Visit: Payer: Self-pay

## 2023-02-15 ENCOUNTER — Encounter: Payer: Self-pay | Admitting: *Deleted

## 2023-02-15 DIAGNOSIS — J4530 Mild persistent asthma, uncomplicated: Secondary | ICD-10-CM | POA: Diagnosis not present

## 2023-02-15 DIAGNOSIS — R0602 Shortness of breath: Secondary | ICD-10-CM | POA: Insufficient documentation

## 2023-02-15 DIAGNOSIS — R8290 Unspecified abnormal findings in urine: Secondary | ICD-10-CM | POA: Diagnosis not present

## 2023-02-15 DIAGNOSIS — R42 Dizziness and giddiness: Secondary | ICD-10-CM | POA: Insufficient documentation

## 2023-02-15 DIAGNOSIS — M545 Low back pain, unspecified: Secondary | ICD-10-CM | POA: Diagnosis present

## 2023-02-15 HISTORY — DX: Cellulitis of left lower limb: L03.116

## 2023-02-15 LAB — POCT URINALYSIS DIP (MANUAL ENTRY)
Bilirubin, UA: NEGATIVE
Blood, UA: NEGATIVE
Glucose, UA: NEGATIVE mg/dL
Ketones, POC UA: NEGATIVE mg/dL
Nitrite, UA: NEGATIVE
Protein Ur, POC: NEGATIVE mg/dL
Spec Grav, UA: 1.01 (ref 1.010–1.025)
Urobilinogen, UA: 0.2 E.U./dL
pH, UA: 5.5 (ref 5.0–8.0)

## 2023-02-15 LAB — POCT FASTING CBG KUC MANUAL ENTRY: POCT Glucose (KUC): 132 mg/dL — AB (ref 70–99)

## 2023-02-15 NOTE — Discharge Instructions (Signed)
Blood work and urine culture are pending.  We will call if they are abnormal.  Please follow-up with your primary care doctor as soon as possible.  Go to the emergency department if symptoms persist.

## 2023-02-15 NOTE — ED Provider Notes (Signed)
[facility]    CSN: [id number] Arrival date & time: 02/15/23  1604      History   Chief Complaint Chief Complaint  Patient presents with   Shortness of Breath   Dizziness    HPI Sally [name] is a 62 y.o. female.   Patient presents with intermittent episodes of shortness of breath and dizziness that started yesterday.  Reports episodes typically last about 5 minutes.  Episodes also include nausea without vomiting and generalized mild headache.  Denies any recent falls or head injuries.  Reports that it does not matter if she is sitting or standing when episodes occur.  States that she has been eating and drinking appropriately.  Denies chest pain.  Reports that she does have asthma but has not been having any asthma complications or coughing recently.  Denies fever.  Denies abdominal pain, dysuria, urinary frequency, hematuria, blood in stool. Reports lower back pain but has chronic pain and is not able to differentiate if pain is any different.    Shortness of Breath Dizziness   Past Medical History:  Diagnosis Date   ADD (attention deficit disorder)    Anemia    Cellulitis of left lower extremity    CFS (chronic fatigue syndrome)    Edema of both lower extremities    Fibromyalgia    GAD (generalized anxiety disorder)    GERD (gastroesophageal reflux disease)    History of colitis    History of pulmonary embolus (PE)    per pt in 2019 told probable PE left lung, completed blood thnner treatment,  stated no clot before or since   History of sepsis 11/17/2020   hospital admission, dx acute left pyelonephritis secondary to obstruction uropathy due to left ureter stone   History of sepsis 11/17/2020   hospital admission, dx septic shock , e.coli, acute pyelonephritis due to kidney stone obstruction   IBS (irritable bowel syndrome)    Left ureteral stone    MDD (major depressive disorder)    Mild persistent asthma    Osteoarthritis    RA (rheumatoid  arthritis) (HCC)    rheumotology--- dr Ophelia Shoulder   RLS (restless legs syndrome)    Spinal stenosis    Type 2 diabetes mellitus (HCC)    followed by pcp  (12-10-2020 pt stated checks daily in am,  fasting sugar-- 120--140)   Urgency of urination    Vitamin B 12 deficiency    Vitamin D deficiency    Wears glasses     Patient Active Problem List   Diagnosis Date Noted   Gastroesophageal reflux disease 11/09/2022   Absolute anemia 04/16/2022   Cough variant asthma vs UACS 01/06/2021   Abnormal CT of the chest 01/06/2021   Upper airway cough syndrome 01/05/2021   Sepsis (HCC) 11/17/2020   Elevated blood pressure reading without diagnosis of hypertension 06/24/2020   Depression 04/27/2020   Diabetes mellitus (HCC) 11/10/2019   Morbid obesity (HCC) 10/28/2019   Vitamin D deficiency 10/23/2019   Colitis    RLS (restless legs syndrome)    IBS (irritable bowel syndrome)     Past Surgical History:  Procedure Laterality Date   COLONOSCOPY  2012   CYSTOSCOPY W/ URETERAL STENT PLACEMENT Left 11/17/2020   Procedure: CYSTOSCOPY LEFT WITH RETROGRADE PYELOGRAM/URETERAL STENT PLACEMENT LEFT DOUBLE J PLACEMENT ;  Surgeon: Marcine Matar, MD;  Location: [facility] OR;  Service: Urology;  Laterality: Left;   CYSTOSCOPY WITH RETROGRADE PYELOGRAM, URETEROSCOPY AND STENT PLACEMENT Left 12/13/2020   Procedure: CYSTOSCOPY WITH  RETROGRADE PYELOGRAM, URETEROSCOPY AND STENT PLACEMENT;  Surgeon: Marcine Matar, MD;  Location: [facility];  Service: Urology;  Laterality: Left;  75 MINS   EYE SURGERY  chlid   pt stated removal foreign body, unsure which eye   HOLMIUM LASER APPLICATION Left 12/13/2020   Procedure: HOLMIUM LASER APPLICATION;  Surgeon: Marcine Matar, MD;  Location: [facility];  Service: Urology;  Laterality: Left;   MOUTH SURGERY      OB History     Gravida  6   Para  5   Term  5   Preterm      AB  1   Living  5      SAB      IAB      Ectopic       Multiple      Live Births               Home Medications    Prior to Admission medications   Medication Sig Start Date End Date Taking? Authorizing Provider  buPROPion (WELLBUTRIN XL) 150 MG 24 hr tablet Take 1 tablet (150 mg total) by mouth every morning. 11/09/22  Yes Whitmire, Dawn W, FNP  cyanocobalamin (VITAMIN B12) 1000 MCG tablet Take 1 tablet (1,000 mcg total) by mouth daily. 11/09/22  Yes Whitmire, Thermon Leyland, FNP  doxycycline (VIBRA-TABS) 100 MG tablet Take by mouth. 02/07/23 02/17/23 Yes [provider]  DULoxetine (CYMBALTA) 30 MG capsule Take 90 mg by mouth daily. 10/31/17  Yes [provider]  ENBREL SURECLICK 50 MG/ML injection Inject into the skin. 08/05/20  Yes [provider]  famotidine (PEPCID) 20 MG tablet Please schedule an appointment with the office for further refills. 06/15/22  Yes Nyoka Cowden, MD  folic acid (FOLVITE) 1 MG tablet Take 1 mg by mouth daily.   Yes [provider]  furosemide (LASIX) 20 MG tablet Take 20 mg by mouth daily as needed.   Yes [provider]  gabapentin (NEURONTIN) 300 MG capsule Take 600 mg by mouth 3 (three) times daily. 2 capsules in the AM, 1 capsule at lunch as needed, 2 capsules at bedtime 10/22/17  Yes [provider]  metFORMIN (GLUCOPHAGE-XR) 500 MG 24 hr tablet Take 1 tablet (500 mg total) by mouth 2 (two) times daily. 10/12/22  Yes Langston Reusing, MD  methotrexate (RHEUMATREX) 2.5 MG tablet TAKE 8 TABLETS BY MOUTH  WEEKLY 03/16/22  Yes [provider]  montelukast (SINGULAIR) 10 MG tablet Take 10 mg by mouth at bedtime.   Yes [provider]  omeprazole (PRILOSEC) 40 MG capsule Take 1 capsule (40 mg total) by mouth daily. 11/09/22  Yes Whitmire, Dawn W, FNP  Semaglutide, 1 MG/DOSE, (OZEMPIC, 1 MG/DOSE,) 2 MG/1.5ML SOPN Inject 1 mg into the skin once a week.   Yes [provider]  Vitamin D, Ergocalciferol, (DRISDOL) 1.25 MG (50000 UNIT) CAPS  capsule Take 1 capsule (50,000 Units total) by mouth every 7 (seven) days. 01/08/23  Yes Whitmire, Dawn W, FNP  Acetaminophen (TYLENOL ARTHRITIS PAIN PO) Take by mouth.    [provider]  albuterol (VENTOLIN HFA) 108 (90 Base) MCG/ACT inhaler Inhale 2 puffs into the lungs every 6 (six) hours as needed. FOR WHEEZING 06/15/22   Nyoka Cowden, MD  budesonide-formoterol (SYMBICORT) 80-4.5 MCG/ACT inhaler Take 2 puffs first thing in am and then another 2 puffs about 12 hours later. 06/15/22   Nyoka Cowden, MD  cyclobenzaprine (FLEXERIL) 10 MG  tablet     [provider]  meloxicam (MOBIC) 15 MG tablet Take 1 tablet by mouth daily. 02/20/22   [provider]  mupirocin ointment (BACTROBAN) 2 % Place 1 application into the nose 2 (two) times daily as needed.    [provider]  polyethylene glycol (MIRALAX MIX-IN PAX) 17 g packet Bid until stooling N, then qd, then prn 03/07/21   Opalski, Gavin Pound, DO  traMADol (ULTRAM) 50 MG tablet     [provider]    Family History Family History  Problem Relation Age of Onset   Pneumonia Mother    Asthma Mother    Diabetes Mother    Hypertension Mother    Depression Mother    Anxiety disorder Mother    Eating disorder Mother    Obesity Mother    Sudden death Mother    Cancer Father        Brain tumor   Asthma Father     Social History Social History   Tobacco Use   Smoking status: Former    Current packs/day: 0.00    Average packs/day: 1.5 packs/day for 8.0 years (12.0 ttl pk-yrs)    Types: Cigarettes    Start date: 12/10/1988    Quit date: 12/10/1996    Years since quitting: 26.2   Smokeless tobacco: Never  Vaping Use   Vaping status: Never Used  Substance Use Topics   Alcohol use: No    Alcohol/week: 0.0 standard drinks of alcohol   Drug use: No     Allergies   Patient has no known allergies.   Review of Systems Review of Systems Per HPI  Physical Exam Triage Vital Signs ED Triage  Vitals  Encounter Vitals Group     BP 02/15/23 1610 121/78     Systolic BP Percentile --      Diastolic BP Percentile --      Pulse Rate 02/15/23 1610 89     Resp 02/15/23 1610 14     Temp 02/15/23 1610 98.1 F (36.7 C)     Temp Source 02/15/23 1610 Oral     SpO2 02/15/23 1610 97 %     Weight --      Height --      Head Circumference --      Peak Flow --      Pain Score 02/15/23 1612 0     Pain Loc --      Pain Education --      Exclude from Growth Chart --    No data found.  Updated Vital Signs BP 121/78   Pulse 89   Temp 98.1 F (36.7 C) (Oral)   Resp 14   LMP 05/31/2012   SpO2 97%   Visual Acuity Right Eye Distance:   Left Eye Distance:   Bilateral Distance:    Right Eye Near:   Left Eye Near:    Bilateral Near:     Physical Exam Constitutional:      General: She is not in acute distress.    Appearance: Normal appearance. She is not toxic-appearing or diaphoretic.  HENT:     Head: Normocephalic and atraumatic.  Eyes:     Extraocular Movements: Extraocular movements intact.     Conjunctiva/sclera: Conjunctivae normal.     Pupils: Pupils are equal, round, and reactive to light.  Cardiovascular:     Rate and Rhythm: Normal rate and regular rhythm.     Pulses: Normal pulses.     Heart sounds:  Normal heart sounds.  Pulmonary:     Effort: Pulmonary effort is normal. No respiratory distress.     Breath sounds: Normal breath sounds. No stridor. No wheezing, rhonchi or rales.  Neurological:     General: No focal deficit present.     Mental Status: She is alert and oriented to person, place, and time. Mental status is at baseline.     Cranial Nerves: Cranial nerves 2-12 are intact.     Sensory: Sensation is intact.     Motor: Motor function is intact.     Coordination: Coordination is intact.     Gait: Gait is intact.  Psychiatric:        Mood and Affect: Mood normal.        Behavior: Behavior normal.        Thought Content: Thought content normal.         Judgment: Judgment normal.      UC Treatments / Results  Labs (all labs ordered are listed, but only abnormal results are displayed) Labs Reviewed  POCT FASTING CBG [facility] MANUAL ENTRY - Abnormal; Notable for the following components:      Result Value   POCT Glucose ([facility]) 132 (*)    All other components within normal limits  POCT URINALYSIS DIP (MANUAL ENTRY) - Abnormal; Notable for the following components:   Clarity, UA hazy (*)    Leukocytes, UA Moderate (2+) (*)    All other components within normal limits  URINE CULTURE  COMPREHENSIVE METABOLIC PANEL  CBC    EKG   Radiology DG Chest 2 View  Result Date: 02/15/2023 CLINICAL DATA:  Shortness of breath EXAM: CHEST - 2 VIEW COMPARISON:  Chest x-ray March 07, 2022 FINDINGS: The cardiomediastinal silhouette is unchanged in contour. No focal pulmonary opacity. No pleural effusion or pneumothorax. The visualized upper abdomen is unremarkable. No acute osseous abnormality. IMPRESSION: No acute cardiopulmonary abnormality. Electronically Signed   By: Jacob Moores M.D.   On: 02/15/2023 17:00    Procedures Procedures (including critical care time)  Medications Ordered in UC Medications - No data to display  Initial Impression / Assessment and Plan / UC Course  I have reviewed the triage vital signs and the nursing notes.  Pertinent labs & imaging results that were available during my care of the patient were reviewed by me and considered in my medical decision making (see chart for details).     Patient was advised to go to the ER given limited evaluation resources here in urgent care.  Patient did not want to go to the ER and risks associated with not going to the ER discussed with patient.  Patient voiced understanding and accepted risks.  Given patient is declining ER evaluation, will do limited workup here in urgent care.  EKG was unremarkable.  Blood glucose unremarkable.  UA shows moderate leukocytes but patient is denying  any urinary symptoms so will send urine culture to confirm this prior to any treatment.  Chest x-ray was negative for any acute cardiopulmonary process.  CMP and CBC pending.  Awaiting results.  I am not sure the exact etiology of patient's symptoms at this time so awaiting results.  Neuroexam is normal which is reassuring nad patient is not having any acute symptoms.  Patient was advised to follow-up with PCP as soon as possible and was given strict ER precautions.  Patient verbalized understanding and was agreeable with plan. Final Clinical Impressions(s) / UC Diagnoses   Final diagnoses:  Dizziness and  giddiness  Shortness of breath  Low back pain, unspecified back pain laterality, unspecified chronicity, unspecified whether sciatica present     Discharge Instructions      Blood work and urine culture are pending.  We will call if they are abnormal.  Please follow-up with your primary care doctor as soon as possible.  Go to the emergency department if symptoms persist.     ED Prescriptions   None    PDMP not reviewed this encounter.   Gustavus Bryant, [place] 02/15/23 [phone]

## 2023-02-15 NOTE — ED Triage Notes (Addendum)
Reports having an episode while walking into a building yesterday where she felt lightheaded and SOB; states went home and went to sleep. Has had few more episodes since then; states 2 episodes today lasted approx 5 min. Only c/o feeling weak at this moment "bc it hit me out there [in the lobby]". Denies any chest pain. States has been treated for lower left leg cellulitis over the past month, currently taking her second round of abx (doxy currently). Denies fever. Denies cough.

## 2023-02-16 LAB — COMPREHENSIVE METABOLIC PANEL
ALT: 12 IU/L (ref 0–32)
AST: 12 IU/L (ref 0–40)
Albumin: 4.5 g/dL (ref 3.9–4.9)
Alkaline Phosphatase: 95 IU/L (ref 44–121)
BUN/Creatinine Ratio: 16 (ref 12–28)
BUN: 16 mg/dL (ref 8–27)
Bilirubin Total: 0.3 mg/dL (ref 0.0–1.2)
CO2: 26 mmol/L (ref 20–29)
Calcium: 9.1 mg/dL (ref 8.7–10.3)
Chloride: 96 mmol/L (ref 96–106)
Creatinine, Ser: 0.99 mg/dL (ref 0.57–1.00)
Globulin, Total: 3.1 g/dL (ref 1.5–4.5)
Glucose: 108 mg/dL — ABNORMAL HIGH (ref 70–99)
Potassium: 3.7 mmol/L (ref 3.5–5.2)
Sodium: 136 mmol/L (ref 134–144)
Total Protein: 7.6 g/dL (ref 6.0–8.5)
eGFR: 64 mL/min/{1.73_m2} (ref >59)

## 2023-02-16 LAB — CBC
Hematocrit: 38.4 % (ref 34.0–46.6)
Hemoglobin: 12.1 g/dL (ref 11.1–15.9)
MCH: 29.7 pg (ref 26.6–33.0)
MCHC: 31.5 g/dL (ref 31.5–35.7)
MCV: 94 fL (ref 79–97)
Platelets: 412 10*3/uL (ref 150–450)
RBC: 4.08 x10E6/uL (ref 3.77–5.28)
RDW: 15 % (ref 11.7–15.4)
WBC: 8.3 10*3/uL (ref 3.4–10.8)

## 2023-02-17 LAB — URINE CULTURE

## 2023-02-26 ENCOUNTER — Telehealth (INDEPENDENT_AMBULATORY_CARE_PROVIDER_SITE_OTHER): Payer: Medicare HMO | Admitting: Family Medicine

## 2023-03-07 NOTE — Progress Notes (Signed)
TeleHealth Visit:  This visit was completed with telemedicine (audio/video) technology. Makel has verbally consented to this TeleHealth visit. The patient is located at home, the provider is located at home. The participants in this visit include the listed provider and patient. The visit was conducted today via MyChart video.  OBESITY Sally [name] is here to discuss her progress with her obesity treatment plan along with follow-up of her obesity related diagnoses.   Today's visit was # 67 Starting weight: 281 lbs Starting date: 05/14/2018 Weight at last in office visit: 246 lbs on 12/11/22 Total weight loss: 35 lbs at last in office visit on 12/11/22. Today's reported weight (03/08/23):  238 lbs  Nutrition Plan: the Category 3 plan  Current exercise:  swimming or walking before she became ill.   Interim History:  She is sick with a URI. Appetite is down with her sickness. She is following the meal plan well and is down 8 lbs since May when she restarted Ozempic. Protein intake is adequate. Water intake is generally good.   Assessment/Plan:  1. Type 2 Diabetes Mellitus with other specified complication, without long-term current use of insulin HgbA1c is at goal. Last A1c was 6.0. CBGs: 90-130 Episodes of hypoglycemia: no Medication(s): Metformin XR 500 mg twice daily with meals, Ozempic 1 mg weekly  Lab Results  Component Value Date   HGBA1C 6.0 (H) 10/12/2022   HGBA1C 5.9 (H) 10/25/2021   HGBA1C 6.0 (H) 06/29/2021   Lab Results  Component Value Date   LDLCALC 82 10/12/2022   CREATININE 0.99 02/15/2023   No results found for: "GFR"  Plan: Continue Ozempic 1 mg through PAP program (shipped to PCP). Refill metformin XR 500 mg twice daily.  2. Depression and anxiety She feels she worries quite a but about getting older and also worries about family.  She also feels she may be slightly depressed. She is currently on bupropion 300 XL daily and Cymbalta 90 mg daily.   Bupropion had been decreased to 150 but she noticed a difference in mood and feels better at the 300 mg dose. Discussed possibly adding SSRI and she declines for now.  Also declines counseling.  She does not feel she can take the time off of work for counseling.  Plan: Continue bupropion and Cymbalta at current dosages. We will continue to monitor.  3. Morbid Obesity: Current BMI 43  Sally [name] is currently in the action stage of change. As such, her goal is to continue with weight loss efforts.  She has agreed to the Category 3 plan.  Exercise goals: Resume exercising at the gym when she is well.  Behavioral modification strategies: increasing lean protein intake, decreasing simple carbohydrates , and planning for success.  Sally [name] has agreed to follow-up with our clinic in 4 weeks.  No orders of the defined types were placed in this encounter.   Medications Discontinued During This Encounter  Medication Reason   buPROPion (WELLBUTRIN XL) 150 MG 24 hr tablet    metFORMIN (GLUCOPHAGE-XR) 500 MG 24 hr tablet Reorder   metFORMIN (GLUCOPHAGE-XR) 500 MG 24 hr tablet Reorder     Meds ordered this encounter  Medications   DISCONTD: metFORMIN (GLUCOPHAGE-XR) 500 MG 24 hr tablet    Sig: Take 1 tablet (500 mg total) by mouth 2 (two) times daily.    Dispense:  180 tablet    Refill:  0    Order Specific Question:   Supervising Provider    Answer:   Carolin Sicks  metFORMIN (GLUCOPHAGE-XR) 500 MG 24 hr tablet    Sig: Take 1 tablet (500 mg total) by mouth 2 (two) times daily.    Dispense:  180 tablet    Refill:  0    Order Specific Question:   Supervising Provider    Answer:   Glennis Brink [[id number]]      Objective:   VITALS: Per patient if applicable, see vitals. GENERAL: Alert and in no acute distress. CARDIOPULMONARY: No increased WOB. Speaking in clear sentences.  PSYCH: Pleasant and cooperative. Speech normal rate and rhythm. Affect is appropriate. Insight and judgement are  appropriate. Attention is focused, linear, and appropriate.  NEURO: Oriented as arrived to appointment on time with no prompting.   Attestation Statements:   Reviewed by clinician on day of visit: allergies, medications, problem list, medical history, surgical history, family history, social history, and previous encounter notes.   This was prepared with the assistance of Engineer, civil (consulting).  Occasional wrong-word or sound-a-like substitutions may have occurred due to the inherent limitations of voice recognition software.

## 2023-03-08 ENCOUNTER — Encounter (INDEPENDENT_AMBULATORY_CARE_PROVIDER_SITE_OTHER): Payer: Self-pay | Admitting: Family Medicine

## 2023-03-08 ENCOUNTER — Telehealth (INDEPENDENT_AMBULATORY_CARE_PROVIDER_SITE_OTHER): Payer: Medicare HMO | Admitting: Family Medicine

## 2023-03-08 DIAGNOSIS — F418 Other specified anxiety disorders: Secondary | ICD-10-CM

## 2023-03-08 DIAGNOSIS — E1169 Type 2 diabetes mellitus with other specified complication: Secondary | ICD-10-CM

## 2023-03-08 DIAGNOSIS — Z7984 Long term (current) use of oral hypoglycemic drugs: Secondary | ICD-10-CM

## 2023-03-08 DIAGNOSIS — Z6841 Body Mass Index (BMI) 40.0 and over, adult: Secondary | ICD-10-CM

## 2023-03-08 DIAGNOSIS — Z7985 Long-term (current) use of injectable non-insulin antidiabetic drugs: Secondary | ICD-10-CM

## 2023-03-08 MED ORDER — METFORMIN HCL ER 500 MG PO TB24
500.0000 mg | ORAL_TABLET | Freq: Two times a day (BID) | ORAL | 0 refills | Status: DC
Start: 2023-03-08 — End: 2023-03-08

## 2023-03-08 MED ORDER — METFORMIN HCL ER 500 MG PO TB24
500.0000 mg | ORAL_TABLET | Freq: Two times a day (BID) | ORAL | 0 refills | Status: DC
Start: 2023-03-08 — End: 2024-03-11

## 2023-03-22 ENCOUNTER — Other Ambulatory Visit: Payer: Self-pay

## 2023-03-22 ENCOUNTER — Ambulatory Visit (INDEPENDENT_AMBULATORY_CARE_PROVIDER_SITE_OTHER): Payer: Medicare HMO | Admitting: Nurse Practitioner

## 2023-03-22 VITALS — BP 128/82 | HR 79 | Ht 63.5 in | Wt 235.0 lb

## 2023-03-22 DIAGNOSIS — Z9149 Other personal history of psychological trauma, not elsewhere classified: Secondary | ICD-10-CM | POA: Diagnosis not present

## 2023-03-22 DIAGNOSIS — Z780 Asymptomatic menopausal state: Secondary | ICD-10-CM

## 2023-03-22 DIAGNOSIS — Z01419 Encounter for gynecological examination (general) (routine) without abnormal findings: Secondary | ICD-10-CM | POA: Diagnosis not present

## 2023-03-22 DIAGNOSIS — I8390 Asymptomatic varicose veins of unspecified lower extremity: Secondary | ICD-10-CM

## 2023-03-22 DIAGNOSIS — R87610 Atypical squamous cells of undetermined significance on cytologic smear of cervix (ASC-US): Secondary | ICD-10-CM

## 2023-03-22 NOTE — Progress Notes (Signed)
   EVERGREEN VARUGHESE [birth year] [id number]   History:  62 y.o. [id number] presents for breast and pelvic exam. Postmenopausal - no HRT, no bleeding. Normal pap history. H/O PE, RA, T2DM. Depression and anxiety managed by PCP. Down ~30 pounds with diet and exercise.   Gynecologic History Patient's last menstrual period was 05/31/2012.   Contraception/Family planning: post menopausal status Sexually active: Yes  Health Maintenance Last Pap: 03/20/2022. Results were: ASCUS neg HPV, 3-year repeat Last mammogram: 03/20/2022. Results were: Normal. Schedule tomorrow Last colonoscopy: 2014. Negative Cologuard 07/2020 Last Dexa: 01/24/2018. Results were: Normal  Past medical history, past surgical history, family history and social history were all reviewed and documented in the EPIC chart. Married. 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 5 sons, 10 grandchildren.   ROS:  A ROS was performed and pertinent positives and negatives are included.  Exam:  Vitals:   03/22/23 1022  BP: 128/82  Pulse: 79  SpO2: 98%  Weight: 235 lb (106.6 kg)  Height: 5' 3.5" (1.613 m)    Body mass index is 40.98 kg/m.  General appearance:  Normal Thyroid:  Symmetrical, normal in size, without palpable masses or nodularity. Respiratory  Auscultation:  Clear without wheezing or rhonchi Cardiovascular  Auscultation:  Regular rate, without rubs, murmurs or gallops  Edema/varicosities:  Not grossly evident Abdominal  Soft,nontender, without masses, guarding or rebound.  Liver/spleen:  No organomegaly noted  Hernia:  None appreciated  Skin  Inspection:  Grossly normal Breasts: Examined lying and sitting.   Right: Without masses, retractions, nipple discharge or axillary adenopathy.   Left: Without masses, retractions, nipple discharge or axillary adenopathy. Genitourinary   Inguinal/mons:  Normal without inguinal adenopathy  External genitalia:  Normal appearing vulva with no masses,  tenderness, or lesions  BUS/Urethra/Skene's glands:  Normal  Vagina:  Normal appearing with normal color and discharge, no lesions  Cervix:  Normal appearing without discharge or lesions  Uterus:  Difficult to palpate but no gross masses or tenderness  Adnexa/parametria:     Rt: Normal in size, without masses or tenderness.   Lt: Normal in size, without masses or tenderness.  Anus and perineum: Normal  Digital rectal exam: Not indicated  Patient informed chaperone available to be present for breast and pelvic exam. Patient has requested no chaperone to be present. Patient has been advised what will be completed during breast and pelvic exam.   Assessment/Plan:  62 y.o. [id number] for breast and pelvic exam.   Well female exam with routine gynecological exam - Education provided on SBEs, importance of preventative screenings, current guidelines, high calcium diet, regular exercise, and multivitamin daily.  Labs with PCP.   Postmenopausal - no HRT, no bleeding  Screening for cervical cancer - 02/2022 ASCUS neg HPV. Will repeat at 3-year interval per guidelines.   Screening for breast cancer - Normal mammogram history.  Continue annual screenings. Schedule tomorrow. Normal breast exam today.  Screening for colon cancer - Negative Cologuard 07/2020. Will repeat at 3-year interval per recommendation.   Screening for osteoporosis - Normal bone density in 2019. Will repeat at age 43.   Return in 1 year for breast and pelvic exam.     Olivia Mackie DNP, 10:32 AM 03/22/2023

## 2023-03-26 ENCOUNTER — Encounter: Payer: Self-pay | Admitting: Nurse Practitioner

## 2023-03-28 ENCOUNTER — Other Ambulatory Visit (INDEPENDENT_AMBULATORY_CARE_PROVIDER_SITE_OTHER): Payer: Self-pay | Admitting: Family Medicine

## 2023-03-28 DIAGNOSIS — E538 Deficiency of other specified B group vitamins: Secondary | ICD-10-CM

## 2023-04-04 ENCOUNTER — Ambulatory Visit: Payer: Medicare HMO | Admitting: Vascular Surgery

## 2023-04-04 ENCOUNTER — Encounter: Payer: Self-pay | Admitting: Vascular Surgery

## 2023-04-04 ENCOUNTER — Ambulatory Visit (HOSPITAL_COMMUNITY)
Admission: RE | Admit: 2023-04-04 | Discharge: 2023-04-04 | Disposition: A | Discharge status: Home / Self Care | Payer: Medicare HMO | Source: Ambulatory Visit | Attending: Vascular Surgery | Admitting: Vascular Surgery

## 2023-04-04 VITALS — BP 125/73 | HR 72 | Temp 97.9°F | Resp 18 | Ht 63.0 in | Wt 241.2 lb

## 2023-04-04 DIAGNOSIS — I872 Venous insufficiency (chronic) (peripheral): Secondary | ICD-10-CM

## 2023-04-04 DIAGNOSIS — I8392 Asymptomatic varicose veins of left lower extremity: Secondary | ICD-10-CM | POA: Diagnosis not present

## 2023-04-04 DIAGNOSIS — I8390 Asymptomatic varicose veins of unspecified lower extremity: Secondary | ICD-10-CM | POA: Diagnosis present

## 2023-04-04 NOTE — Progress Notes (Unsigned)
ASSESSMENT & PLAN   CHRONIC VENOUS INSUFFICIENCY: This patient has CEAP C4 venous disease (hyperpigmentation).  She has predominantly deep venous reflux in the left with some superficial venous reflux.  We have discussed conservative measures.  I have encouraged her to avoid prolonged sitting and standing.  We have discussed the importance of exercise specifically walking and water aerobics.  I have recommended that she continue to wear her compression stockings.  We have discussed the importance of daily leg elevation and the proper positioning for this.  We have also discussed the importance of maintaining a healthy weight as central obesity especially increases lower extremity venous pressure.  Since her reflux is predominantly in the deep system I do not think addressing the short segment of reflux in the superficial system in the thigh would significantly impact her lower extremity swelling.  I have explained however that this may progress over time and if she develops worsening symptoms and swelling or worsening varicose veins we could reevaluate this.  REASON FOR CONSULT:    Left leg swelling.  The consult is requested by Koren Bound, FNP.  HPI:   Sally [name] is a 62 y.o. female who is referred with left lower extremity swelling.  I have reviewed the records from the referring office.  The patient was seen on 01/26/2023.  She was complaining of left lower extremity swelling.  She had a duplex scan at that time which showed no evidence of DVT.  She does have a history of cellulitis in the left lower extremity.  She is sent for vascular consultation.  On my history, the patient notes the gradual onset of leg swelling over the last several years.  This comes and goes somewhat.  She has some swelling on the right to but the swelling is predominantly on the left.  She does describe some aching pain and heaviness associated with prolonged sitting and standing.  She is not aware of any previous  history of DVT although she had a pulmonary embolus in 2018 was on Eliquis for about a year.  She has had no previous venous procedures.  She does occasionally wear thigh-high compression stockings.  She occasionally elevates her legs.  Past Medical History:  Diagnosis Date   ADD (attention deficit disorder)    Anemia    Cellulitis of left lower extremity    CFS (chronic fatigue syndrome)    Edema of both lower extremities    Fibromyalgia    GAD (generalized anxiety disorder)    GERD (gastroesophageal reflux disease)    History of colitis    History of pulmonary embolus (PE)    per pt in 2019 told probable PE left lung, completed blood thnner treatment,  stated no clot before or since   History of sepsis 11/17/2020   hospital admission, dx acute left pyelonephritis secondary to obstruction uropathy due to left ureter stone   History of sepsis 11/17/2020   hospital admission, dx septic shock , e.coli, acute pyelonephritis due to kidney stone obstruction   IBS (irritable bowel syndrome)    Left ureteral stone    MDD (major depressive disorder)    Mild persistent asthma    Osteoarthritis    RA (rheumatoid arthritis) (HCC)    rheumotology--- dr Ophelia Shoulder   RLS (restless legs syndrome)    Spinal stenosis    Type 2 diabetes mellitus (HCC)    followed by pcp  (12-10-2020 pt stated checks daily in am,  fasting sugar-- 120--140)   Urgency of  urination    Vitamin B 12 deficiency    Vitamin D deficiency    Wears glasses     Family History  Problem Relation Age of Onset   Pneumonia Mother    Asthma Mother    Diabetes Mother    Hypertension Mother    Depression Mother    Anxiety disorder Mother    Eating disorder Mother    Obesity Mother    Sudden death Mother    Cancer Father        Brain tumor   Asthma Father     SOCIAL HISTORY: Social History   Tobacco Use   Smoking status: Former    Current packs/day: 0.00    Average packs/day: 1.5 packs/day for 8.0 years (12.0 ttl  pk-yrs)    Types: Cigarettes    Start date: 12/10/1988    Quit date: 12/10/1996    Years since quitting: 26.3   Smokeless tobacco: Never  Substance Use Topics   Alcohol use: No    Alcohol/week: 0.0 standard drinks of alcohol    No Known Allergies  Current Outpatient Medications  Medication Sig Dispense Refill   Acetaminophen (TYLENOL ARTHRITIS PAIN PO) Take by mouth.     albuterol (VENTOLIN HFA) 108 (90 Base) MCG/ACT inhaler Inhale 2 puffs into the lungs every 6 (six) hours as needed. FOR WHEEZING 18 g 0   budesonide-formoterol (SYMBICORT) 80-4.5 MCG/ACT inhaler Take 2 puffs first thing in am and then another 2 puffs about 12 hours later. 30.6 each 3   buPROPion (WELLBUTRIN XL) 300 MG 24 hr tablet Take 300 mg by mouth daily.     cyanocobalamin (VITAMIN B12) 1000 MCG tablet Take 1 tablet (1,000 mcg total) by mouth daily. 90 tablet 1   cyclobenzaprine (FLEXERIL) 10 MG tablet 3 (three) times daily as needed for muscle spasms.     DULoxetine (CYMBALTA) 30 MG capsule Take 90 mg by mouth daily.     ENBREL SURECLICK 50 MG/ML injection Inject into the skin.     famotidine (PEPCID) 20 MG tablet Please schedule an appointment with the office for further refills. 90 tablet 0   folic acid (FOLVITE) 1 MG tablet Take 1 mg by mouth daily.     furosemide (LASIX) 20 MG tablet Take 20 mg by mouth daily as needed.     gabapentin (NEURONTIN) 300 MG capsule Take 600 mg by mouth 3 (three) times daily. 2 capsules in the AM, 1 capsule at lunch as needed, 2 capsules at bedtime  0   meloxicam (MOBIC) 15 MG tablet Take 1 tablet by mouth daily.     metFORMIN (GLUCOPHAGE-XR) 500 MG 24 hr tablet Take 1 tablet (500 mg total) by mouth 2 (two) times daily. 180 tablet 0   methotrexate (RHEUMATREX) 2.5 MG tablet TAKE 8 TABLETS BY MOUTH  WEEKLY     montelukast (SINGULAIR) 10 MG tablet Take 10 mg by mouth at bedtime.     omeprazole (PRILOSEC) 40 MG capsule Take 1 capsule (40 mg total) by mouth daily. 90 capsule 1    Semaglutide, 1 MG/DOSE, (OZEMPIC, 1 MG/DOSE,) 2 MG/1.5ML SOPN Inject 1 mg into the skin once a week.     Vitamin D, Ergocalciferol, (DRISDOL) 1.25 MG (50000 UNIT) CAPS capsule Take 1 capsule (50,000 Units total) by mouth every 7 (seven) days. 12 capsule 0   No current facility-administered medications for this visit.    REVIEW OF SYSTEMS:  [X]  denotes positive finding, [ ]  denotes negative finding Cardiac  Comments:  Chest  pain or chest pressure:    Shortness of breath upon exertion: x   Short of breath when lying flat:    Irregular heart rhythm:        Vascular    Pain in calf, thigh, or hip brought on by ambulation: x   Pain in feet at night that wakes you up from your sleep:     Blood clot in your veins:    Leg swelling:  x       Pulmonary    Oxygen at home:    Productive cough:     Wheezing:         Neurologic    Sudden weakness in arms or legs:     Sudden numbness in arms or legs:     Sudden onset of difficulty speaking or slurred speech:    Temporary loss of vision in one eye:     Problems with dizziness:  x       Gastrointestinal    Blood in stool:     Vomited blood:         Genitourinary    Burning when urinating:     Blood in urine:        Psychiatric    Major depression:         Hematologic    Bleeding problems:    Problems with blood clotting too easily:        Skin    Rashes or ulcers:        Constitutional    Fever or chills:    -  PHYSICAL EXAM:   Vitals:   04/04/23 1319  BP: 125/73  Pulse: 72  Resp: 18  Temp: 97.9 F (36.6 C)  TempSrc: Temporal  SpO2: 100%  Weight: 241 lb 3.2 oz (109.4 kg)  Height: 5\' 3"  (1.6 m)   Body mass index is 42.73 kg/m. GENERAL: The patient is a well-nourished female, in no acute distress. The vital signs are documented above. CARDIAC: There is a regular rate and rhythm.  VASCULAR: I do not detect carotid bruits. I could not palpate pedal pulses because of her leg swelling but she had a biphasic posterior  tibial signal bilaterally with a brisk but monophasic dorsalis pedis signal bilaterally. I looked at her left great saphenous vein myself with the SonoSite.  It look like that the larger vein was an anterior accessory saphenous vein which split fairly quickly so there was only a short segment of this that could be treated. She has bilateral lower extremity swelling which is more significant on the left side.  She does have some hyperpigmentation bilaterally.  PULMONARY: There is good air exchange bilaterally without wheezing or rales. ABDOMEN: Soft and non-tender with normal pitched bowel sounds.  MUSCULOSKELETAL: There are no major deformities. NEUROLOGIC: No focal weakness or paresthesias are detected. SKIN: There are no ulcers or rashes noted. PSYCHIATRIC: The patient has a normal affect.  DATA:    VENOUS DUPLEX: I have independently interpreted her venous duplex scan today.  This was of the left lower extremity only.  There was no evidence of DVT.  There was deep venous reflux in the common femoral vein, femoral vein, and popliteal vein.  There was superficial venous reflux in the left great saphenous vein from the saphenofemoral junction through the proximal thigh.  The vein was dilated up to 7.2 mm.  Results of the study are summarized in the diagram below.    Venous duplex scan on 01/26/2023 showed no evidence  of DVT of either lower extremity.   Waverly [facility]

## 2023-04-05 ENCOUNTER — Telehealth (INDEPENDENT_AMBULATORY_CARE_PROVIDER_SITE_OTHER): Payer: Medicare HMO | Admitting: Family Medicine

## 2023-04-05 ENCOUNTER — Encounter (INDEPENDENT_AMBULATORY_CARE_PROVIDER_SITE_OTHER): Payer: Self-pay | Admitting: Family Medicine

## 2023-04-05 DIAGNOSIS — Z6841 Body Mass Index (BMI) 40.0 and over, adult: Secondary | ICD-10-CM

## 2023-04-05 DIAGNOSIS — E559 Vitamin D deficiency, unspecified: Secondary | ICD-10-CM

## 2023-04-05 DIAGNOSIS — E1169 Type 2 diabetes mellitus with other specified complication: Secondary | ICD-10-CM

## 2023-04-05 DIAGNOSIS — Z7985 Long-term (current) use of injectable non-insulin antidiabetic drugs: Secondary | ICD-10-CM

## 2023-04-05 DIAGNOSIS — Z7984 Long term (current) use of oral hypoglycemic drugs: Secondary | ICD-10-CM

## 2023-04-05 DIAGNOSIS — K5900 Constipation, unspecified: Secondary | ICD-10-CM | POA: Diagnosis not present

## 2023-04-05 DIAGNOSIS — F418 Other specified anxiety disorders: Secondary | ICD-10-CM | POA: Diagnosis not present

## 2023-04-05 DIAGNOSIS — K5909 Other constipation: Secondary | ICD-10-CM

## 2023-04-05 MED ORDER — VITAMIN D (ERGOCALCIFEROL) 1.25 MG (50000 UNIT) PO CAPS
50000.0000 [IU] | ORAL_CAPSULE | ORAL | 0 refills | Status: AC
Start: 2023-04-05 — End: ?

## 2023-04-05 NOTE — Progress Notes (Signed)
TeleHealth Visit:  This visit was completed with telemedicine (audio/video) technology. Sally [name] has verbally consented to this TeleHealth visit. The patient is located at home, the provider is located at home. The participants in this visit include the listed provider and patient. The visit was conducted today via phone call.  Unsuccessful attempt was made to connect to video. Length of call was 23 minutes.   OBESITY Sally [name] is here to discuss Sally [name] progress with Sally [name] obesity treatment plan along with follow-up of Sally [name] obesity related diagnoses.   Today's visit was # 68 Starting weight: 281 lbs Starting date: 05/14/2018 Weight at last in office visit: 246 lbs on 12/11/22 Total weight loss: 35 lbs at last in office visit on 12/11/22. Reported weight (03/08/23):  238 lbs Today's reported weight (04/05/23): none reported  Nutrition Plan: the Category 3 plan  Current exercise:  walking at work, swimming at home pool.  Interim History:  Sally [name] recently lost Sally [name] dog which has been hard for Sally [name]. Sally [name] is drinking some soda (a mini can). Sally [name] feels Sally [name] has been doing well with protein intake. Sally [name] says Sally [name] is doing "fairly well" on the plan.  Assessment/Plan:  1. Type 2 Diabetes Mellitus with other specified complication, without long-term current use of insulin HgbA1c is at goal. Last A1c was 6.0. Medication(s): Metformin XR 500 mg twice daily with meals, Ozempic 1 mg weekly   Lab Results  Component Value Date   HGBA1C 6.0 (H) 10/12/2022   HGBA1C 5.9 (H) 10/25/2021   HGBA1C 6.0 (H) 06/29/2021   Lab Results  Component Value Date   LDLCALC 82 10/12/2022   CREATININE 0.99 02/15/2023   No results found for: "GFR"  Plan: Continue Metformin XR 500 mg twice daily with meals, Ozempic 1 mg weekly   2. Vitamin D Deficiency Vitamin D is at goal of 50.  Most recent vitamin D level was 51.. Sally [name] is on  prescription ergocalciferol 50,000 IU weekly. Lab Results  Component Value Date   VD25OH  51.2 10/12/2022   VD25OH 39.8 06/26/2022   VD25OH 43.3 10/25/2021    Plan: Continue and refill  prescription ergocalciferol 50,000 IU weekly   3. Depression and Anxiety Feels some mild depression. Sally [name] dog recently passed which didn't help. Denies SI/HI. Overall mood is stable. Medication(s): Bupropion XL 300 mg daily, Cymbalta 90 mg daily  Plan: Continue  Bupropion XL 300 mg daily, Cymbalta 90 mg daily  4. Constipation [name] notes constipation.   This is likely related to GLP-1.   Plan: Increase fiber and water. Add MiraLAX once daily.    5. Morbid Obesity: Current BMI 42  Sally [name] is currently in the action stage of change. As such, Sally [name] goal is to continue with weight loss efforts.  Sally [name] has agreed to the Category 3 plan.  Exercise goals:  as is  Behavioral modification strategies: increasing lean protein intake, decreasing simple carbohydrates , decrease liquid calories, increase water intake, planning for success, and increasing fiber rich foods.  Sally [name] has agreed to follow-up with our clinic in 4 weeks.  No orders of the defined types were placed in this encounter.   Medications Discontinued During This Encounter  Medication Reason   Vitamin D, Ergocalciferol, (DRISDOL) 1.25 MG (50000 UNIT) CAPS capsule Reorder     Meds ordered this encounter  Medications   Vitamin D, Ergocalciferol, (DRISDOL) 1.25 MG (50000 UNIT) CAPS capsule    Sig: Take 1 capsule (50,000 Units total) by mouth every 7 (seven) days.    Dispense:  12  capsule    Refill:  0    Order Specific Question:   Supervising Provider    Answer:   Glennis Brink [[id number]]      Objective:   VITALS: Per patient if applicable, see vitals. GENERAL: Alert and in no acute distress. CARDIOPULMONARY: No increased WOB. Speaking in clear sentences.  PSYCH: Pleasant and cooperative. Speech normal rate and rhythm. Affect is appropriate. Insight and judgement are appropriate. Attention is focused, linear, and  appropriate.  NEURO: Oriented as arrived to appointment on time with no prompting.   Attestation Statements:   Reviewed by clinician on day of visit: allergies, medications, problem list, medical history, surgical history, family history, social history, and previous encounter notes.   This was prepared with the assistance of Engineer, civil (consulting).  Occasional wrong-word or sound-a-like substitutions may have occurred due to the inherent limitations of voice recognition software.

## 2023-07-19 ENCOUNTER — Ambulatory Visit
Admission: EM | Admit: 2023-07-19 | Discharge: 2023-07-19 | Disposition: A | Discharge status: Home / Self Care | Payer: Medicare HMO | Attending: Family Medicine | Admitting: Family Medicine

## 2023-07-19 ENCOUNTER — Encounter: Payer: Self-pay | Admitting: *Deleted

## 2023-07-19 ENCOUNTER — Ambulatory Visit (INDEPENDENT_AMBULATORY_CARE_PROVIDER_SITE_OTHER): Payer: Medicare HMO

## 2023-07-19 ENCOUNTER — Other Ambulatory Visit: Payer: Self-pay

## 2023-07-19 DIAGNOSIS — R051 Acute cough: Secondary | ICD-10-CM

## 2023-07-19 DIAGNOSIS — J4541 Moderate persistent asthma with (acute) exacerbation: Secondary | ICD-10-CM | POA: Diagnosis not present

## 2023-07-19 MED ORDER — PREDNISONE 10 MG (48) PO TBPK: ORAL_TABLET | ORAL | 0 refills | Status: DC

## 2023-07-19 NOTE — ED Triage Notes (Signed)
Pt states asthma flare >1 week, intermittent. The last 4 days has gotten worse. She went to a different Urgent Care 2 weeks ago and was given steroid shot and Rx for steroids and antibiotics. She has completed the steroids and is still taking the antibiotics. States she had her well visit with her doctor prior to this on 06/13/23 and felt fine then the next week her symptoms started

## 2023-07-19 NOTE — ED Provider Notes (Signed)
[facility]   [id number] 07/19/23 Arrival Time: 1841  ASSESSMENT & PLAN:  1. Acute cough   2. Moderate persistent asthma with acute exacerbation    Without resp distress.  I have personally viewed and independently interpreted the imaging studies ordered this visit. CXR: no acute changes.  Asthma precautions given. OTC symptom care as needed.  Meds ordered this encounter  Medications   predniSONE (STERAPRED UNI-PAK 48 TAB) 10 MG (48) TBPK tablet    Sig: Take as directed.    Dispense:  48 tablet    Refill:  0    Recommend:   Follow-up Information     Schedule an appointment as soon as possible for a visit  with Gordan Payment., MD.   Specialty: Internal Medicine Why: For follow up. Contact information: [address] [place] [address] [phone]                  Reviewed expectations re: course of current medical issues. Questions answered. Outlined signs and symptoms indicating need for more acute intervention. Patient verbalized understanding. After Visit Summary given.  SUBJECTIVE: History from: patient.  Sally [name] is a 63 y.o. female who presents with complaint of fairly persistent wheezing. Has been on antibiotics and a short course of steroids in the past couple of weeks; steroids helped but symptoms have returned; mainly wheezing. Denies CP/fever. Ambulatory without difficulty. No LE edema. Inhaler with mild temp help. Social History   Tobacco Use  Smoking Status Former   Current packs/day: 0.00   Average packs/day: 1.5 packs/day for 8.0 years (12.0 ttl pk-yrs)   Types: Cigarettes   Start date: 12/10/1988   Quit date: 12/10/1996   Years since quitting: 26.6  Smokeless Tobacco Never    OBJECTIVE:  Vitals:   07/19/23 1937  BP: 124/80  Pulse: 75  Resp: 18  Temp: 97.9 F (36.6 C)  TempSrc: Oral  SpO2: 95%     General appearance: alert; NAD HEENT: [place]; AT; without nasal congestion Neck: supple without  LAD Cv: RRR without murmer Lungs: unlabored respirations, moderate bilateral expiratory wheezing; cough: absent; no significant respiratory distress Skin: warm and dry Psychological: alert and cooperative; normal mood and affect  Imaging: No results found.   No Known Allergies  Past Medical History:  Diagnosis Date   ADD (attention deficit disorder)    Anemia    Cellulitis of left lower extremity    CFS (chronic fatigue syndrome)    Edema of both lower extremities    Fibromyalgia    GAD (generalized anxiety disorder)    GERD (gastroesophageal reflux disease)    History of colitis    History of pulmonary embolus (PE)    per pt in 2019 told probable PE left lung, completed blood thnner treatment,  stated no clot before or since   History of sepsis 11/17/2020   hospital admission, dx acute left pyelonephritis secondary to obstruction uropathy due to left ureter stone   History of sepsis 11/17/2020   hospital admission, dx septic shock , e.coli, acute pyelonephritis due to kidney stone obstruction   IBS (irritable bowel syndrome)    Left ureteral stone    MDD (major depressive disorder)    Mild persistent asthma    Osteoarthritis    RA (rheumatoid arthritis) (HCC)    rheumotology--- dr Ophelia Shoulder   RLS (restless legs syndrome)    Spinal stenosis    Type 2 diabetes mellitus (HCC)    followed by pcp  (12-10-2020 pt stated  checks daily in am,  fasting sugar-- 120--140)   Urgency of urination    Vitamin B 12 deficiency    Vitamin D deficiency    Wears glasses    Family History  Problem Relation Age of Onset   Pneumonia Mother    Asthma Mother    Diabetes Mother    Hypertension Mother    Depression Mother    Anxiety disorder Mother    Eating disorder Mother    Obesity Mother    Sudden death Mother    Cancer Father        Brain tumor   Asthma Father    Social History   Socioeconomic History   Marital status: Married    Spouse name: Kirpa Bertz   Number of children:  5   Years of education: Not on file   Highest education level: Not on file  Occupational History   Occupation: Building surveyor  Tobacco Use   Smoking status: Former    Current packs/day: 0.00    Average packs/day: 1.5 packs/day for 8.0 years (12.0 ttl pk-yrs)    Types: Cigarettes    Start date: 12/10/1988    Quit date: 12/10/1996    Years since quitting: 26.6   Smokeless tobacco: Never  Vaping Use   Vaping status: Never Used  Substance and Sexual Activity   Alcohol use: No    Alcohol/week: 0.0 standard drinks of alcohol   Drug use: No   Sexual activity: Not on file    Comment: First IC <16, Partners <5, No STDs  Other Topics Concern   Not on file  Social History Narrative   Not on file   Social Drivers of Health   Financial Resource Strain: Not on file  Food Insecurity: Low Risk  (02/16/2023)   [facility]   Hunger Vital Sign    Worried About Running Out of Food in the Last Year: Never true    Ran Out of Food in the Last Year: Never true  Transportation Needs: Not on file (02/16/2023)  Physical Activity: Not on file  Stress: Not on file  Social Connections: Not on file  Intimate Partner Violence: Not on file             Mardella Layman, MD 07/19/23 2010

## 2023-10-07 ENCOUNTER — Encounter (HOSPITAL_BASED_OUTPATIENT_CLINIC_OR_DEPARTMENT_OTHER): Payer: Self-pay | Admitting: Emergency Medicine

## 2023-10-07 ENCOUNTER — Ambulatory Visit (HOSPITAL_BASED_OUTPATIENT_CLINIC_OR_DEPARTMENT_OTHER)
Admission: EM | Admit: 2023-10-07 | Discharge: 2023-10-07 | Disposition: A | Discharge status: Home / Self Care | Attending: Family Medicine | Admitting: Family Medicine

## 2023-10-07 ENCOUNTER — Ambulatory Visit (INDEPENDENT_AMBULATORY_CARE_PROVIDER_SITE_OTHER)
Admit: 2023-10-07 | Discharge: 2023-10-07 | Disposition: A | Discharge status: Home / Self Care | Attending: Family Medicine | Admitting: Family Medicine

## 2023-10-07 DIAGNOSIS — W19XXXA Unspecified fall, initial encounter: Secondary | ICD-10-CM | POA: Diagnosis not present

## 2023-10-07 DIAGNOSIS — Y92009 Unspecified place in unspecified non-institutional (private) residence as the place of occurrence of the external cause: Secondary | ICD-10-CM

## 2023-10-07 DIAGNOSIS — M19012 Primary osteoarthritis, left shoulder: Secondary | ICD-10-CM | POA: Diagnosis not present

## 2023-10-07 DIAGNOSIS — M25512 Pain in left shoulder: Secondary | ICD-10-CM

## 2023-10-07 MED ORDER — TRAMADOL HCL 50 MG PO TABS: 50.0000 mg | ORAL_TABLET | Freq: Four times a day (QID) | ORAL | 0 refills | Status: DC | PRN

## 2023-10-07 NOTE — Discharge Instructions (Addendum)
 Radiology has read the film and although the films shows a lot of degenerative arthritic changes she does not have an acute dislocation.  She may use meloxicam 15 mg daily and she has this already.  May use tramadol, 50 mg every 6 hours if needed for pain.  Provided only 15 pills.  Would need to see her orthopedist if her pain persist.

## 2023-10-07 NOTE — ED Provider Notes (Signed)
 [facility]    CSN: [id number] Arrival date & time: 10/07/23  1436      History   Chief Complaint Chief Complaint  Patient presents with   Fall    HPI Sally [name] is a 63 y.o. female.   Pt tripped in her kitchen today and landed on her left side. She c/o left shoulder pain.  She reports that she is already planning for a left shoulder replacement but it is really hurting after this fall this morning.  She cannot lift her left arm much on her own.   Fall Pertinent negatives include no chest pain, no abdominal pain and no shortness of breath.    Past Medical History:  Diagnosis Date   ADD (attention deficit disorder)    Anemia    Cellulitis of left lower extremity    CFS (chronic fatigue syndrome)    Edema of both lower extremities    Fibromyalgia    GAD (generalized anxiety disorder)    GERD (gastroesophageal reflux disease)    History of colitis    History of pulmonary embolus (PE)    per pt in 2019 told probable PE left lung, completed blood thnner treatment,  stated no clot before or since   History of sepsis 11/17/2020   hospital admission, dx acute left pyelonephritis secondary to obstruction uropathy due to left ureter stone   History of sepsis 11/17/2020   hospital admission, dx septic shock , e.coli, acute pyelonephritis due to kidney stone obstruction   IBS (irritable bowel syndrome)    Left ureteral stone    MDD (major depressive disorder)    Mild persistent asthma    Osteoarthritis    RA (rheumatoid arthritis) (HCC)    rheumotology--- dr Ophelia Shoulder   RLS (restless legs syndrome)    Spinal stenosis    Type 2 diabetes mellitus (HCC)    followed by pcp  (12-10-2020 pt stated checks daily in am,  fasting sugar-- 120--140)   Urgency of urination    Vitamin B 12 deficiency    Vitamin D deficiency    Wears glasses     Patient Active Problem List   Diagnosis Date Noted   Gastroesophageal reflux disease 11/09/2022   Absolute anemia 04/16/2022    Cough variant asthma vs UACS 01/06/2021   Abnormal CT of the chest 01/06/2021   Upper airway cough syndrome 01/05/2021   Sepsis (HCC) 11/17/2020   Elevated blood pressure reading without diagnosis of hypertension 06/24/2020   Depression with anxiety 04/27/2020   Diabetes mellitus (HCC) 11/10/2019   Morbid obesity (HCC) 10/28/2019   Vitamin D deficiency 10/23/2019   Colitis    RLS (restless legs syndrome)    IBS (irritable bowel syndrome)     Past Surgical History:  Procedure Laterality Date   COLONOSCOPY  2012   CYSTOSCOPY W/ URETERAL STENT PLACEMENT Left 11/17/2020   Procedure: CYSTOSCOPY LEFT WITH RETROGRADE PYELOGRAM/URETERAL STENT PLACEMENT LEFT DOUBLE J PLACEMENT ;  Surgeon: Marcine Matar, MD;  Location: [facility] OR;  Service: Urology;  Laterality: Left;   CYSTOSCOPY WITH RETROGRADE PYELOGRAM, URETEROSCOPY AND STENT PLACEMENT Left 12/13/2020   Procedure: CYSTOSCOPY WITH RETROGRADE PYELOGRAM, URETEROSCOPY AND STENT PLACEMENT;  Surgeon: Marcine Matar, MD;  Location: [facility];  Service: Urology;  Laterality: Left;  75 MINS   EYE SURGERY  chlid   pt stated removal foreign body, unsure which eye   HOLMIUM LASER APPLICATION Left 12/13/2020   Procedure: HOLMIUM LASER APPLICATION;  Surgeon: Marcine Matar, MD;  Location: [facility];  Service: Urology;  Laterality: Left;   MOUTH SURGERY      OB History     Gravida  6   Para  5   Term  5   Preterm      AB  1   Living  5      SAB      IAB      Ectopic      Multiple      Live Births               Home Medications    Prior to Admission medications   Medication Sig Start Date End Date Taking? Authorizing Provider  Acetaminophen (TYLENOL ARTHRITIS PAIN PO) Take by mouth.    [provider]  albuterol (VENTOLIN HFA) 108 (90 Base) MCG/ACT inhaler Inhale 2 puffs into the lungs every 6 (six) hours as needed. FOR WHEEZING 06/15/22   Nyoka Cowden, MD  Azelastine  HCl 137 MCG/SPRAY SOLN Place 2 sprays into both nostrils 2 (two) times daily. 07/05/23   [provider]  benzonatate (TESSALON) 200 MG capsule Take 400 mg by mouth every 8 (eight) hours as needed. 07/05/23   [provider]  budesonide-formoterol (SYMBICORT) 80-4.5 MCG/ACT inhaler Take 2 puffs first thing in am and then another 2 puffs about 12 hours later. 06/15/22   Nyoka Cowden, MD  buPROPion (WELLBUTRIN XL) 300 MG 24 hr tablet Take 300 mg by mouth daily. 02/27/23   [provider]  cefdinir (OMNICEF) 300 MG capsule Take 300 mg by mouth 2 (two) times daily. 07/05/23   [provider]  cyanocobalamin (VITAMIN B12) 1000 MCG tablet Take 1 tablet (1,000 mcg total) by mouth daily. 11/09/22   Whitmire, Dawn, FNP  cyclobenzaprine (FLEXERIL) 10 MG tablet 3 (three) times daily as needed for muscle spasms.    [provider]  DULoxetine (CYMBALTA) 30 MG capsule Take 90 mg by mouth daily. 10/31/17   [provider]  ENBREL SURECLICK 50 MG/ML injection Inject into the skin. 08/05/20   [provider]  famotidine (PEPCID) 20 MG tablet Please schedule an appointment with the office for further refills. 06/15/22   Nyoka Cowden, MD  folic acid (FOLVITE) 1 MG tablet Take 1 mg by mouth daily.    [provider]  furosemide (LASIX) 20 MG tablet Take 20 mg by mouth daily as needed.    [provider]  gabapentin (NEURONTIN) 300 MG capsule Take 600 mg by mouth 3 (three) times daily. 2 capsules in the AM, 1 capsule at lunch as needed, 2 capsules at bedtime 10/22/17   [provider]  meloxicam (MOBIC) 15 MG tablet Take 1 tablet by mouth daily. 02/20/22   [provider]  metFORMIN (GLUCOPHAGE-XR) 500 MG 24 hr tablet Take 1 tablet (500 mg total) by mouth 2 (two) times daily. 03/08/23   Whitmire, Dawn, FNP  methotrexate (RHEUMATREX) 2.5 MG tablet TAKE 8 TABLETS BY MOUTH  WEEKLY 03/16/22   [provider]  montelukast  (SINGULAIR) 10 MG tablet Take 10 mg by mouth at bedtime.    [provider]  omeprazole (PRILOSEC) 40 MG capsule Take 1 capsule (40 mg total) by mouth daily. 11/09/22   Whitmire, Dawn, FNP  predniSONE (STERAPRED UNI-PAK 48 TAB) 10 MG (48) TBPK tablet Take as directed. 07/19/23   Mardella Layman, MD  Semaglutide, 1 MG/DOSE, (OZEMPIC, 1 MG/DOSE,) 2 MG/1.5ML SOPN Inject 1 mg into the skin once a week.  [provider]  traMADol (ULTRAM) 50 MG tablet Take 1 tablet (50 mg total) by mouth every 6 (six) hours as needed. 10/07/23  Yes Prescilla Sours, FNP  Vitamin D, Ergocalciferol, (DRISDOL) 1.25 MG (50000 UNIT) CAPS capsule Take 1 capsule (50,000 Units total) by mouth every 7 (seven) days. 04/05/23   Whitmire, Dawn, FNP    Family History Family History  Problem Relation Age of Onset   Pneumonia Mother    Asthma Mother    Diabetes Mother    Hypertension Mother    Depression Mother    Anxiety disorder Mother    Eating disorder Mother    Obesity Mother    Sudden death Mother    Cancer Father        Brain tumor   Asthma Father     Social History Social History   Tobacco Use   Smoking status: Former    Current packs/day: 0.00    Average packs/day: 1.5 packs/day for 8.0 years (12.0 ttl pk-yrs)    Types: Cigarettes    Start date: 12/10/1988    Quit date: 12/10/1996    Years since quitting: 26.8   Smokeless tobacco: Never  Vaping Use   Vaping status: Never Used  Substance Use Topics   Alcohol use: No    Alcohol/week: 0.0 standard drinks of alcohol   Drug use: No     Allergies   Patient has no known allergies.   Review of Systems Review of Systems  Constitutional:  Negative for chills and fever.  HENT:  Negative for ear pain and sore throat.   Eyes:  Negative for pain and visual disturbance.  Respiratory:  Negative for cough and shortness of breath.   Cardiovascular:  Negative for chest pain and palpitations.  Gastrointestinal:  Negative for abdominal pain,  constipation, diarrhea, nausea and vomiting.  Genitourinary:  Negative for dysuria and hematuria.  Musculoskeletal:  Positive for arthralgias (Left arm and left shoulder pain). Negative for back pain.  Skin:  Negative for color change and rash.  Neurological:  Negative for seizures and syncope.  All other systems reviewed and are negative.    Physical Exam Triage Vital Signs ED Triage Vitals  Encounter Vitals Group     BP 10/07/23 1448 (!) 143/77     Systolic BP Percentile --      Diastolic BP Percentile --      Pulse Rate 10/07/23 1446 82     Resp 10/07/23 1446 16     Temp 10/07/23 1446 97.7 F (36.5 C)     Temp Source 10/07/23 1446 Oral     SpO2 10/07/23 1448 97 %     Weight --      Height --      Head Circumference --      Peak Flow --      Pain Score 10/07/23 1445 4     Pain Loc --      Pain Education --      Exclude from Growth Chart --    No data found.  Updated Vital Signs BP (!) 143/77 (BP Location: Right Arm)   Pulse 82   Temp 97.7 F (36.5 C) (Oral)   Resp 16   LMP 05/31/2012   SpO2 97%   Visual Acuity Right Eye Distance:   Left Eye Distance:   Bilateral Distance:    Right Eye Near:   Left Eye Near:    Bilateral Near:     Physical Exam Vitals and nursing note reviewed.  Constitutional:      General: She is not in acute distress.    Appearance: She is well-developed. She is not ill-appearing or toxic-appearing.  HENT:     Head: Normocephalic and atraumatic.     Right Ear: External ear normal.     Left Ear: External ear normal.     Nose: Nose normal.     Mouth/Throat:     Lips: Pink.     Mouth: Mucous membranes are moist.  Eyes:     Conjunctiva/sclera: Conjunctivae normal.     Pupils: Pupils are equal, round, and reactive to light.  Cardiovascular:     Rate and Rhythm: Normal rate and regular rhythm.     Heart sounds: S1 normal and S2 normal. No murmur heard. Pulmonary:     Effort: Pulmonary effort is normal. No respiratory distress.      Breath sounds: Normal breath sounds. No decreased breath sounds, wheezing, rhonchi or rales.  Abdominal:     General: Bowel sounds are normal.     Palpations: Abdomen is soft.     Tenderness: There is no abdominal tenderness.  Musculoskeletal:        General: No swelling.  Skin:    General: Skin is warm and dry.     Capillary Refill: Capillary refill takes less than 2 seconds.     Findings: No rash.  Neurological:     Mental Status: She is alert and oriented to person, place, and time.  Psychiatric:        Mood and Affect: Mood normal.      UC Treatments / Results  Labs (all labs ordered are listed, but only abnormal results are displayed) Labs Reviewed - No data to display  EKG   Radiology DG Shoulder Left Result Date: 10/07/2023 CLINICAL DATA:  Left shoulder pain after fall today. EXAM: LEFT SHOULDER - 2+ VIEW COMPARISON:  January 11, 2022. FINDINGS: No acute fracture or dislocation is noted. Deformity of proximal left humeral head is noted most consistent with old fracture. Severe degenerative change of left glenohumeral joint is noted. IMPRESSION: Old posttraumatic and degenerative changes as noted above. No acute abnormality seen. Electronically Signed   By: Lupita Raider M.D.   On: 10/07/2023 16:09    Procedures Procedures (including critical care time)  Medications Ordered in UC Medications - No data to display  Initial Impression / Assessment and Plan / UC Course  I have reviewed the triage vital signs and the nursing notes.  Pertinent labs & imaging results that were available during my care of the patient were reviewed by me and considered in my medical decision making (see chart for details).     The patient tripped in her kitchen and fell on her left shoulder.  She is actually planning for a left shoulder replacement.  Radiology reviewed the films and although she has significant arthritic changes she does not have an acute fracture nor dislocation.  She is  already on meloxicam 15 mg daily.  Added tramadol 50 mg 1 every 6 hours if needed for pain.  She would benefit from icing the shoulder half an hour on half an hour off.  Follow-up with orthopedics if pain persist. Final Clinical Impressions(s) / UC Diagnoses   Final diagnoses:  Acute pain of left shoulder  Osteoarthritis of left shoulder, unspecified osteoarthritis type  Fall in home, initial encounter     Discharge Instructions      Radiology has read the film and although the films shows  a lot of degenerative arthritic changes she does not have an acute dislocation.  She may use meloxicam 15 mg daily and she has this already.  May use tramadol, 50 mg every 6 hours if needed for pain.  Provided only 15 pills.  Would need to see her orthopedist if her pain persist.     ED Prescriptions     Medication Sig Dispense Auth. Provider   traMADol (ULTRAM) 50 MG tablet Take 1 tablet (50 mg total) by mouth every 6 (six) hours as needed. 15 tablet Prescilla Sours, FNP      I have reviewed the PDMP during this encounter.   Prescilla Sours, FNP 10/07/23 [phone]

## 2023-10-07 NOTE — ED Triage Notes (Signed)
 Pt tripped in her kitchen today and landed on her left side. She c/o left shoulder pain.

## 2023-10-08 ENCOUNTER — Ambulatory Visit (HOSPITAL_BASED_OUTPATIENT_CLINIC_OR_DEPARTMENT_OTHER)

## 2023-11-25 IMAGING — DX DG HUMERUS 2V *L*
2 series · 2 of 2 positions shown · non-contrast
Comparison: None Available.

CLINICAL DATA: Fall

EXAM:
LEFT HUMERUS - 2 VIEW; LEFT SHOULDER

[humerus ap]
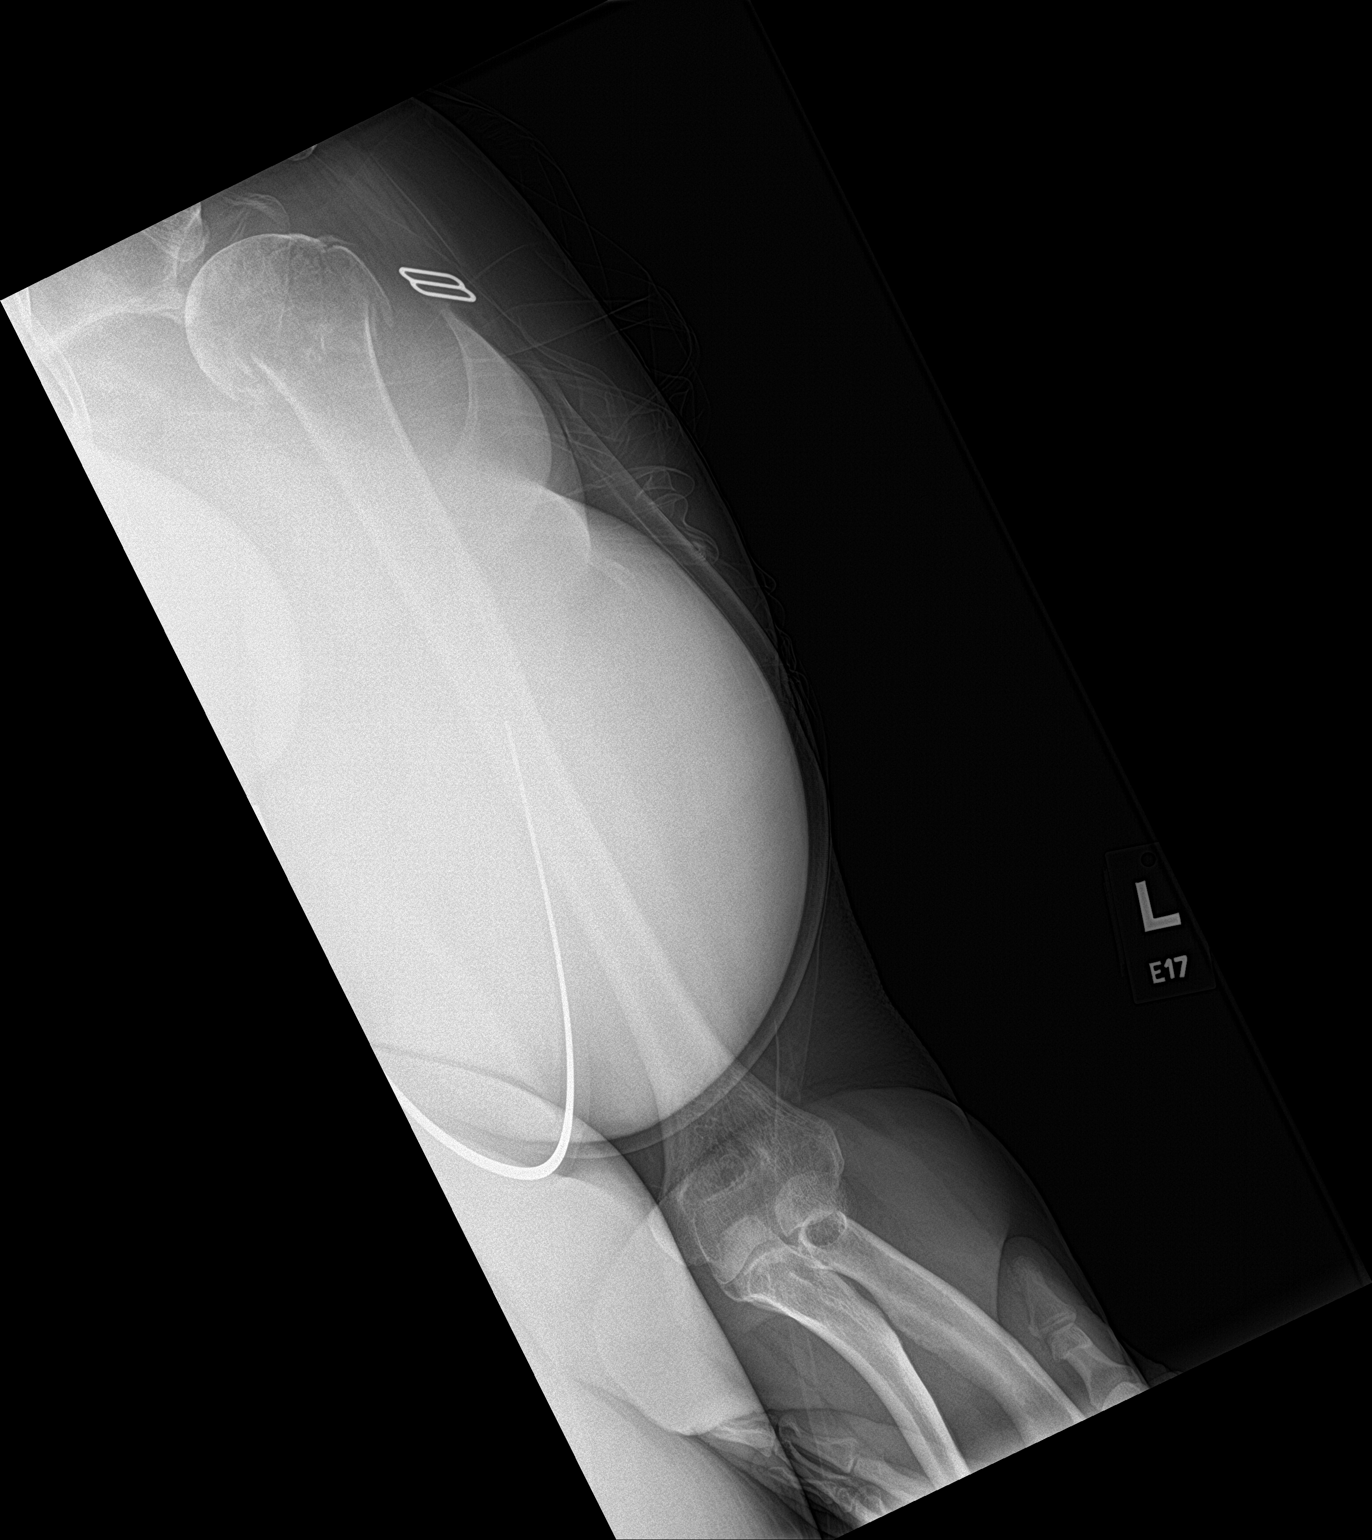

[humerus lat]
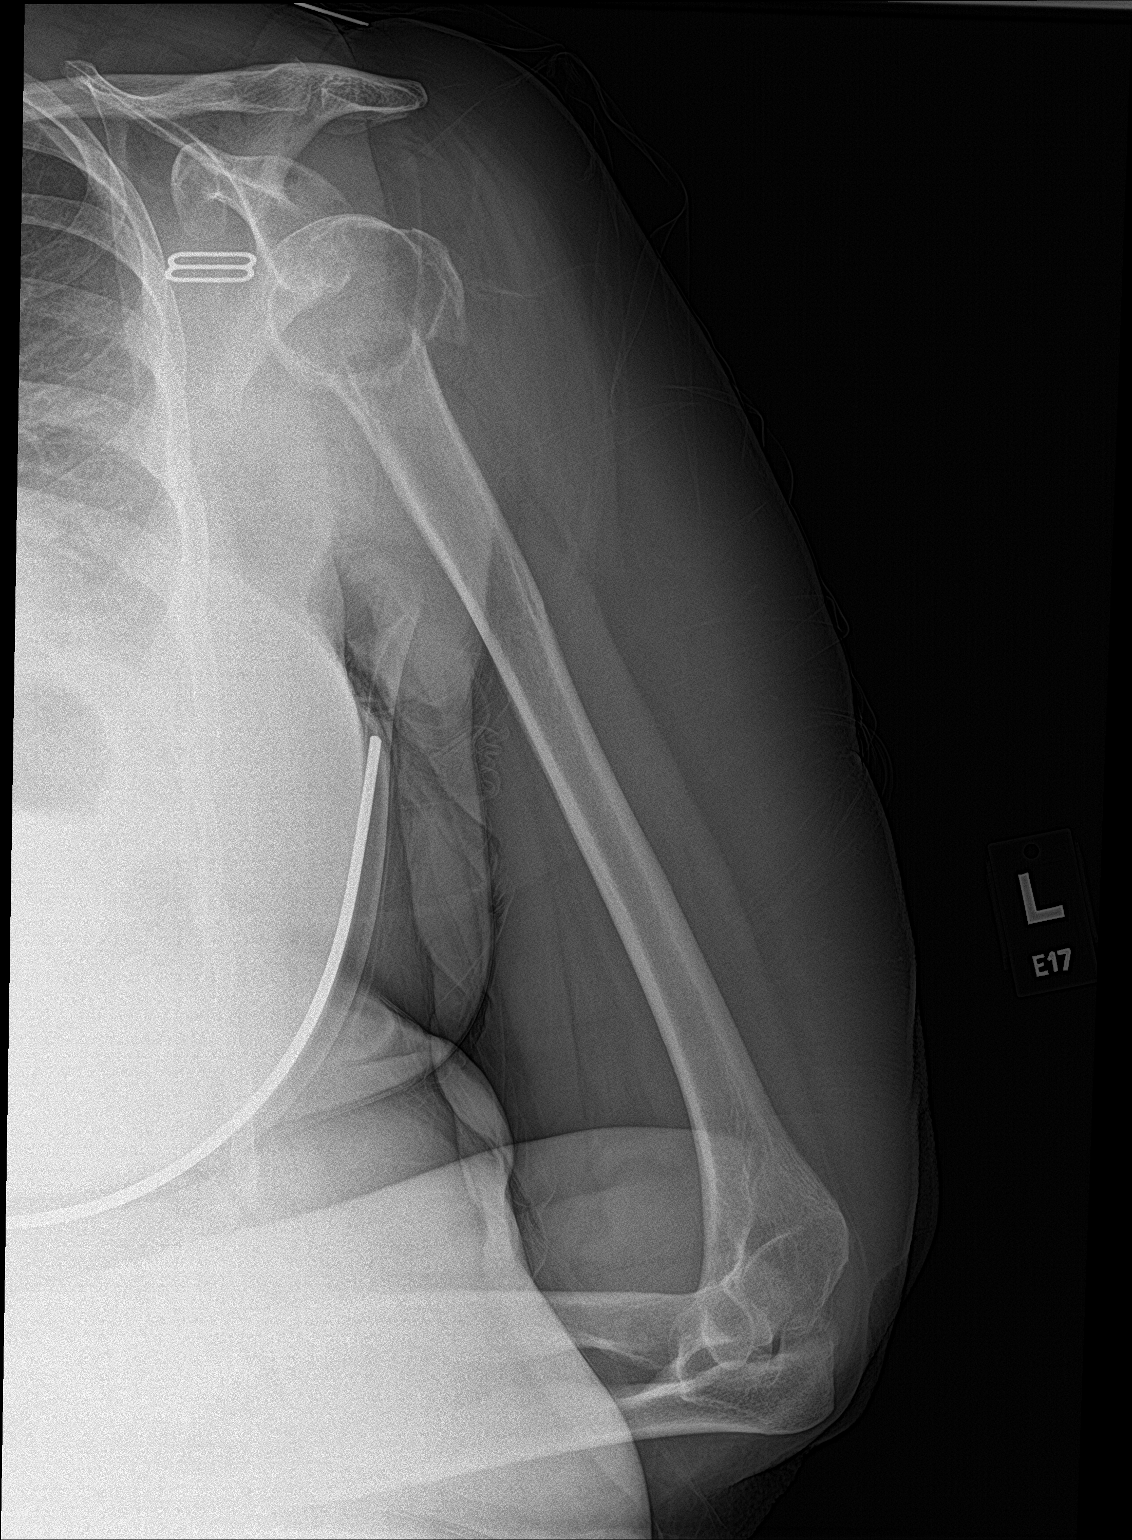

[2 of 2 positions shown; findings below may reference images not displayed]

FINDINGS: Comminuted fracture of the neck of the left humerus involving the
greater tuberosity. No dislocation. Surrounding soft tissue edema.
IMPRESSION: Comminuted fracture of the neck of the left humerus involving the
greater tuberosity.

## 2023-11-25 IMAGING — DX DG SHOULDER 1V*L*
3 series · 3 of 3 positions shown · non-contrast
Comparison: None Available.

CLINICAL DATA: Fall

EXAM:
LEFT HUMERUS - 2 VIEW; LEFT SHOULDER

[shoulder ap neutral]
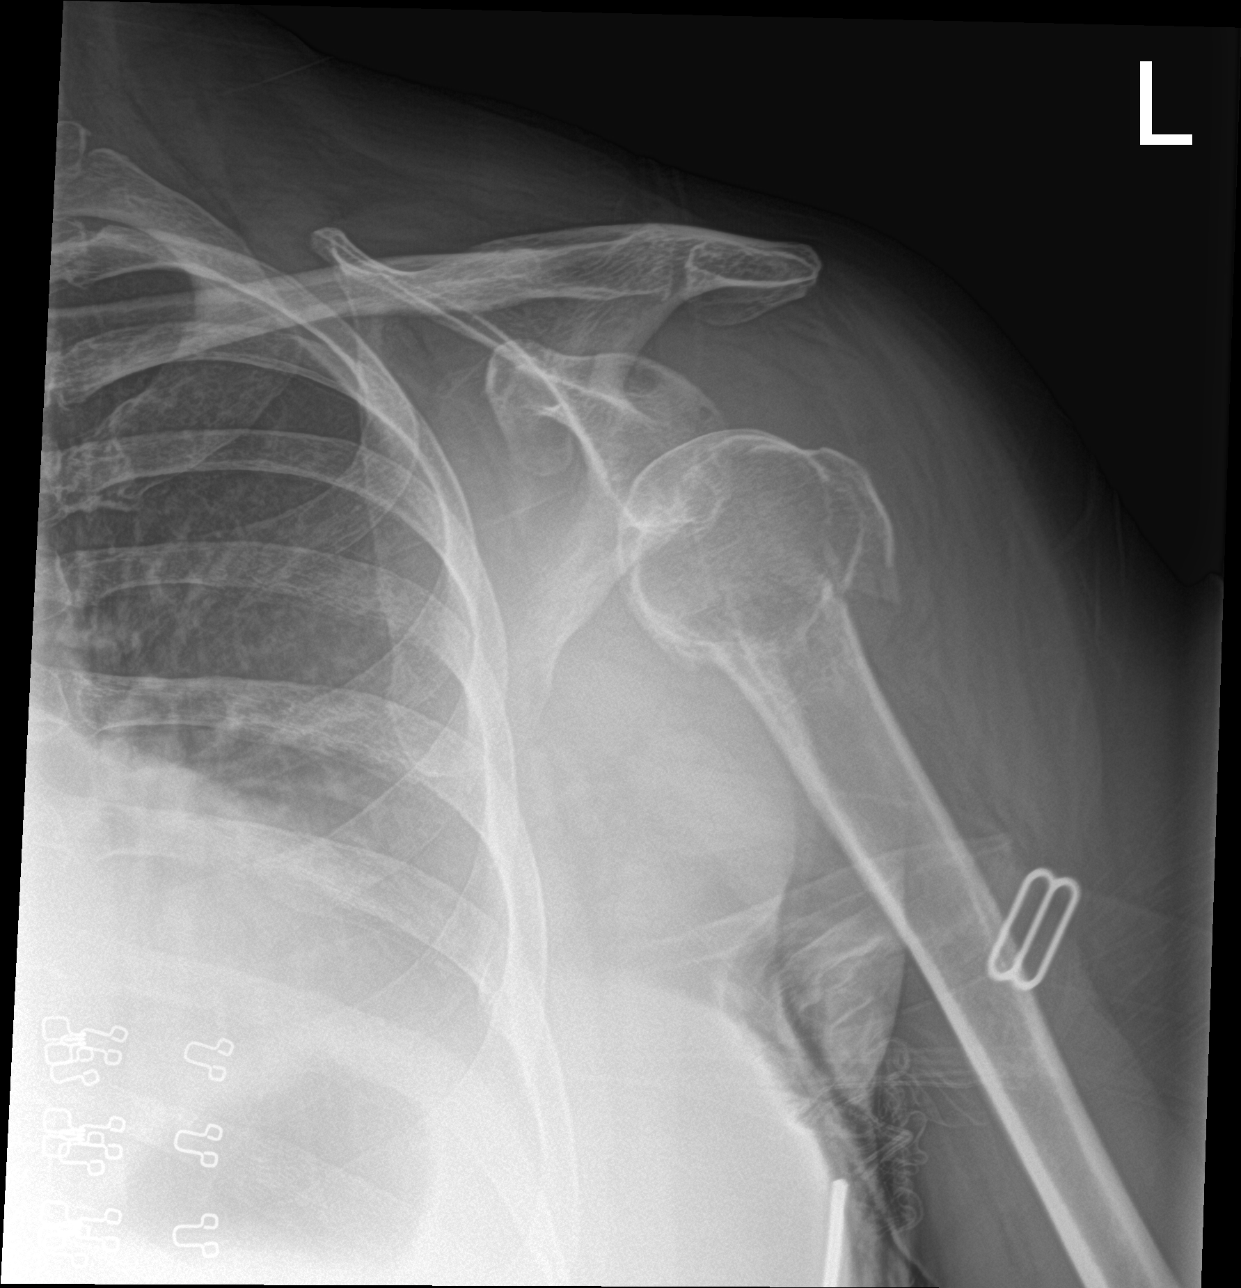

[shoulder grashey]
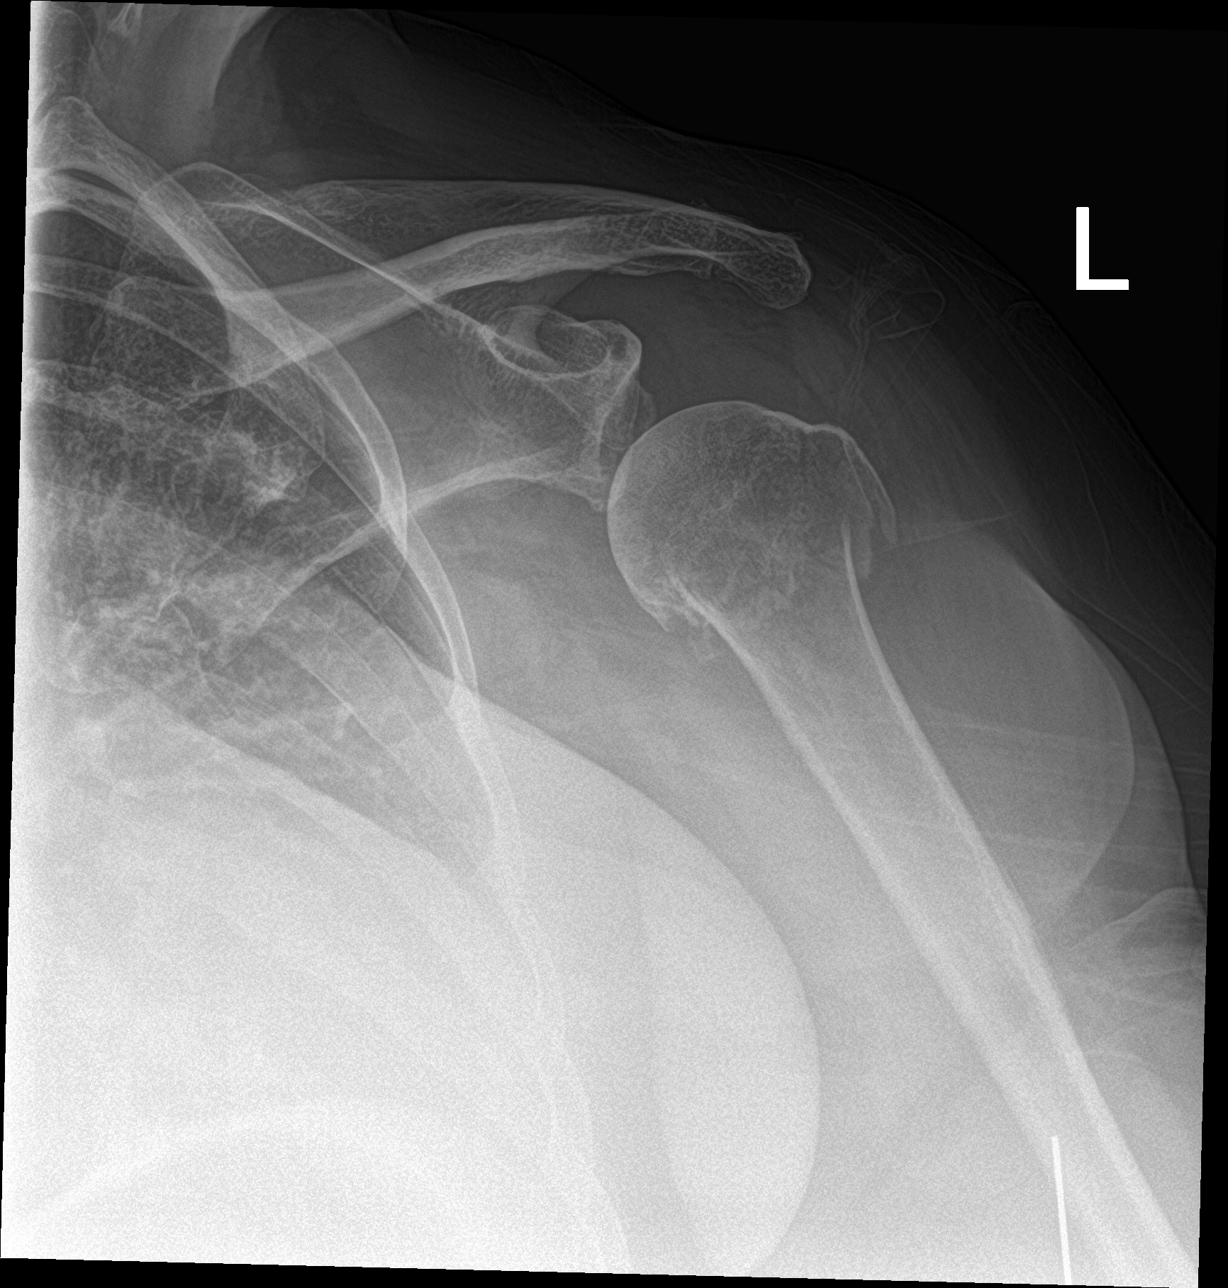

[shoulder y view]
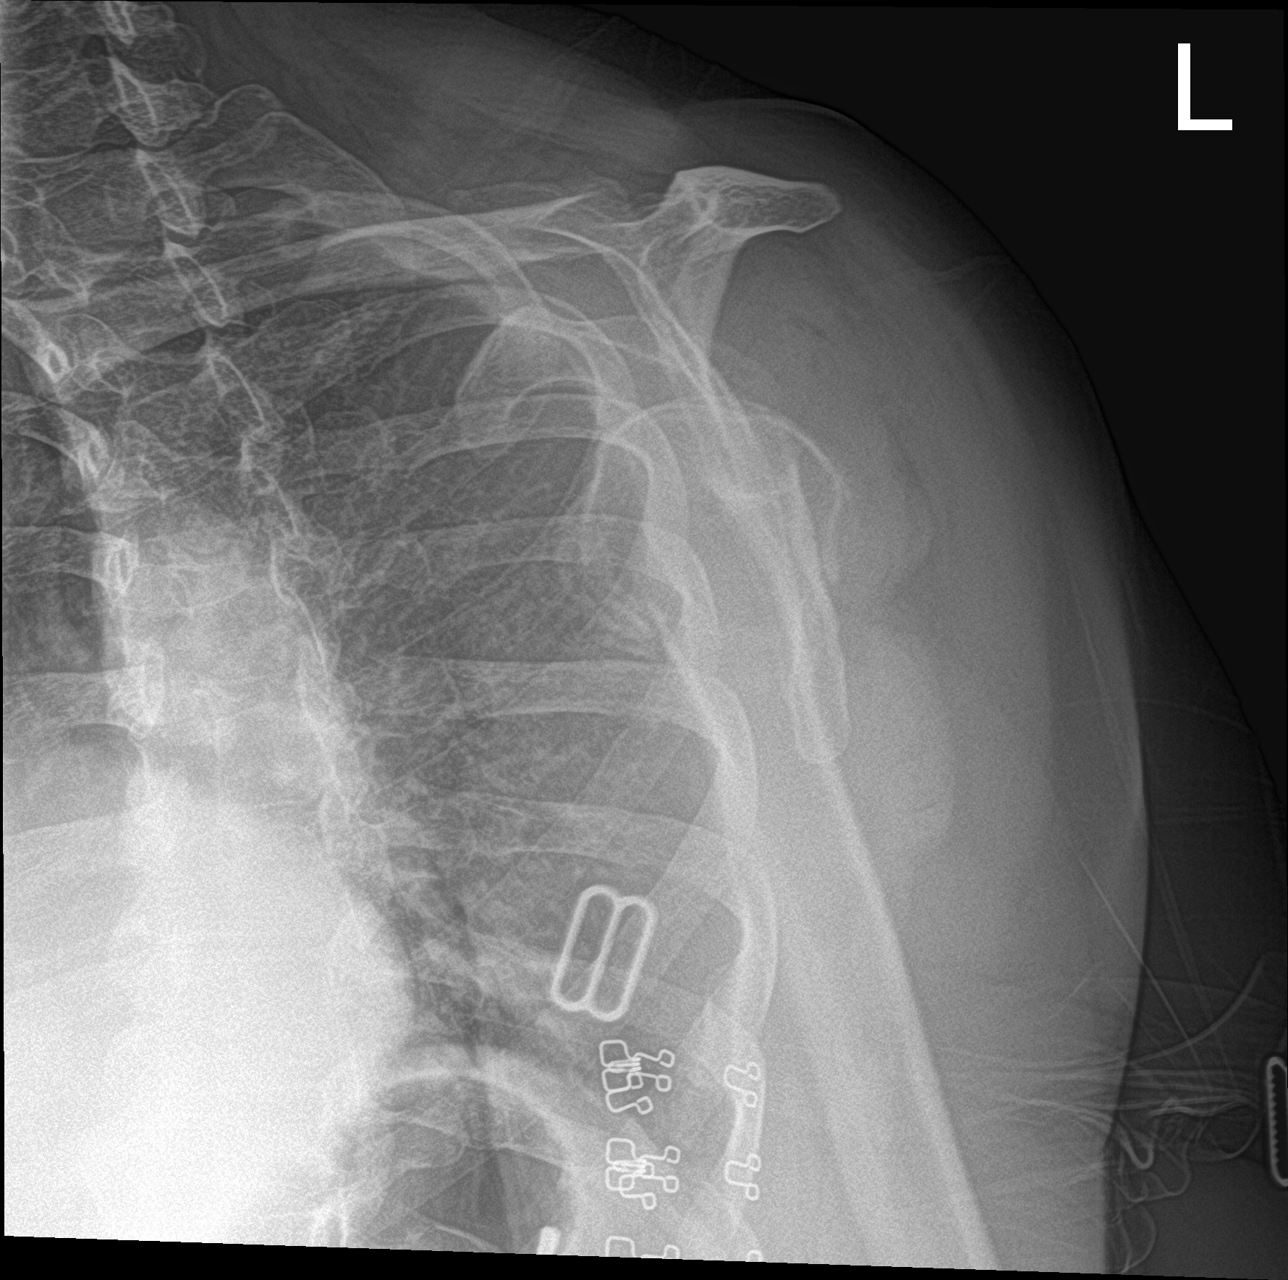

[3 of 3 positions shown; findings below may reference images not displayed]

FINDINGS: Comminuted fracture of the neck of the left humerus involving the
greater tuberosity. No dislocation. Surrounding soft tissue edema.
IMPRESSION: Comminuted fracture of the neck of the left humerus involving the
greater tuberosity.

## 2023-11-26 IMAGING — CT CT SHOULDER*L* W/O CM
1 series · 12 of 14 positions shown, 15 images · non-contrast
Comparison: Left shoulder series from yesterday.

CLINICAL DATA: Left humeral fracture.

EXAM:
CT OF THE UPPER LEFT SHOULDER WITHOUT CONTRAST
TECHNIQUE: Multidetector CT imaging of the upper left shoulder was performed
according to the standard protocol.
RADIATION DOSE REDUCTION: This exam was performed according to the
departmental dose-optimization program which includes automated
exposure control, adjustment of the mA and/or kV according to
patient size and/or use of iterative reconstruction technique.

[Series 6: soft tissue · axial · 0.41mm/px · z∈[-476,-354]mm · 12 of 73 slices shown, 15 images]
[im 6/73  soft-tissue]
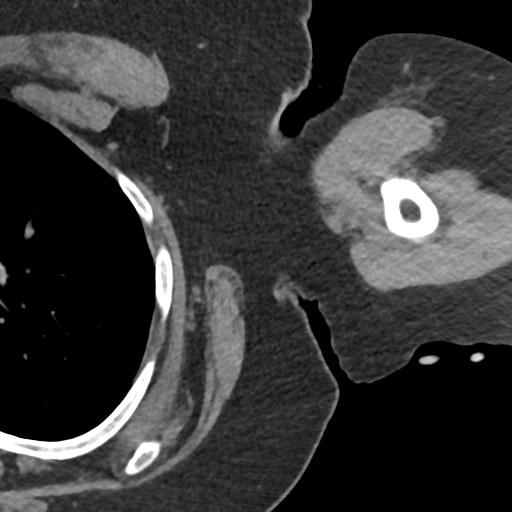
[im 6/73  bone]
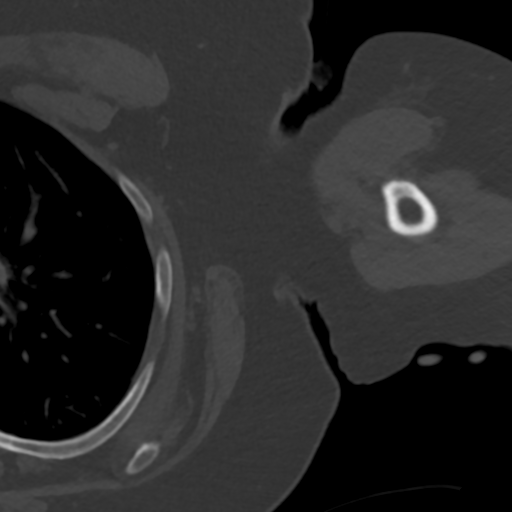
[im 12/73  bone]
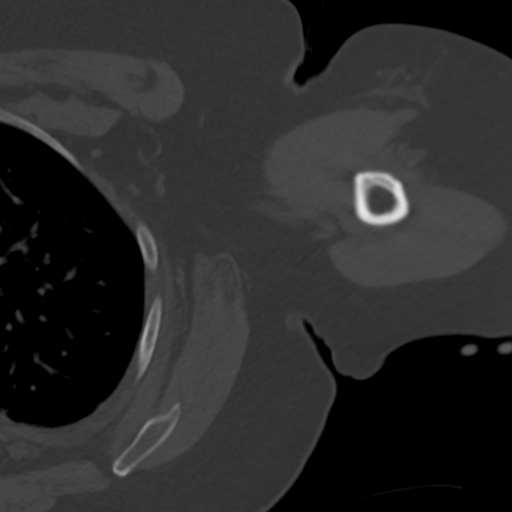
[im 17/73  bone]
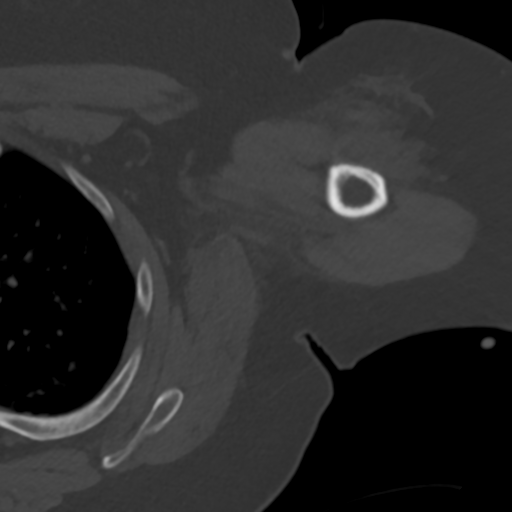
[im 23/73  bone]
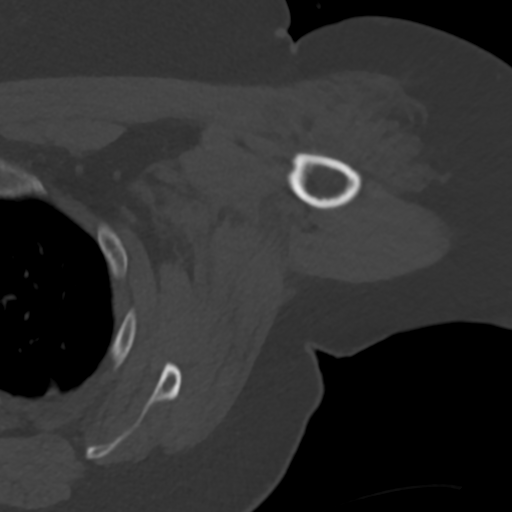
[im 28/73  soft-tissue]
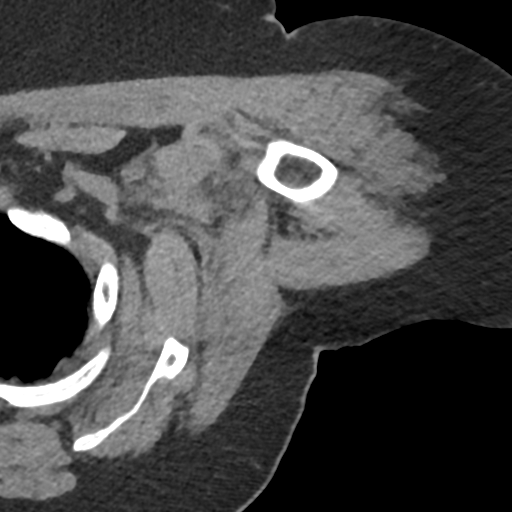
[im 28/73  bone]
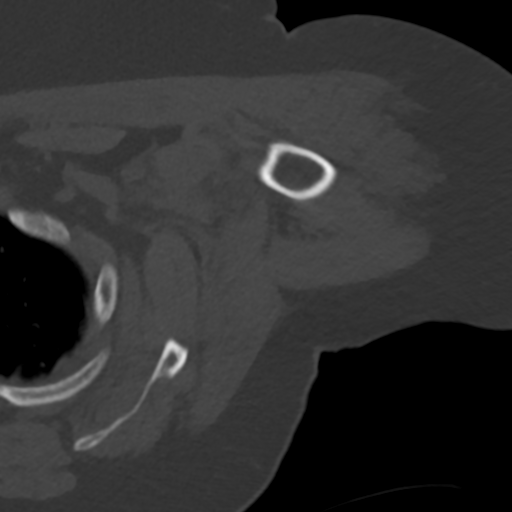
[im 34/73  bone]
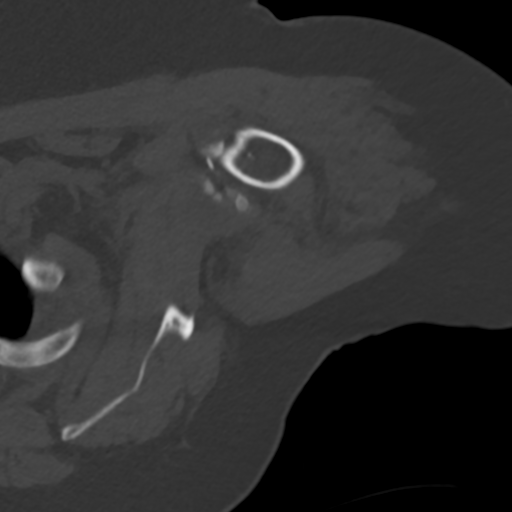
[im 39/73  bone]
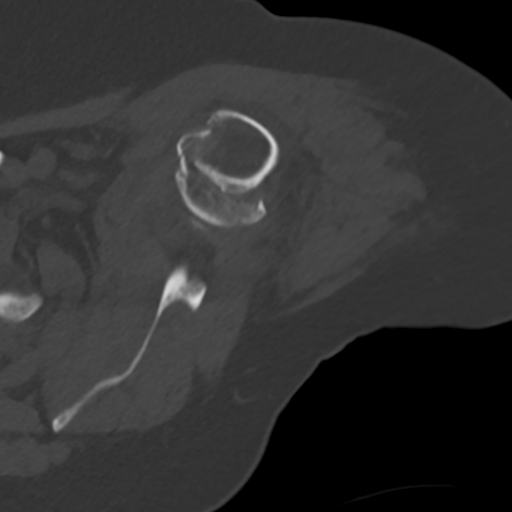
[im 45/73  bone]
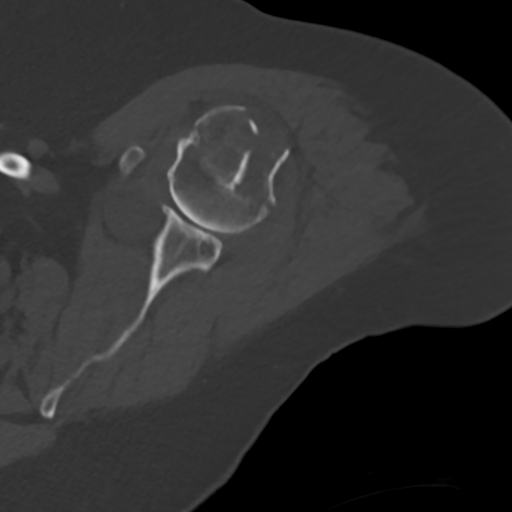
[im 50/73  soft-tissue]
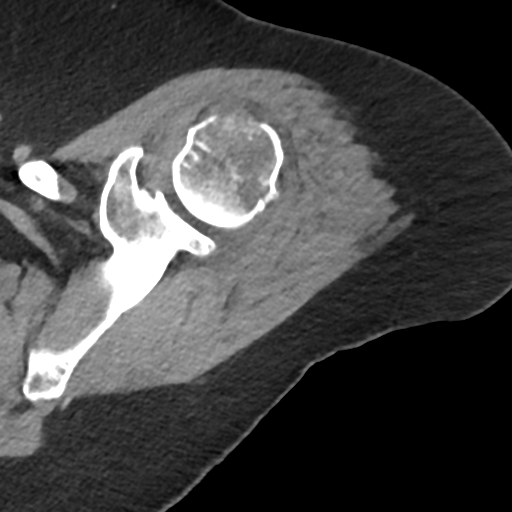
[im 50/73  bone]
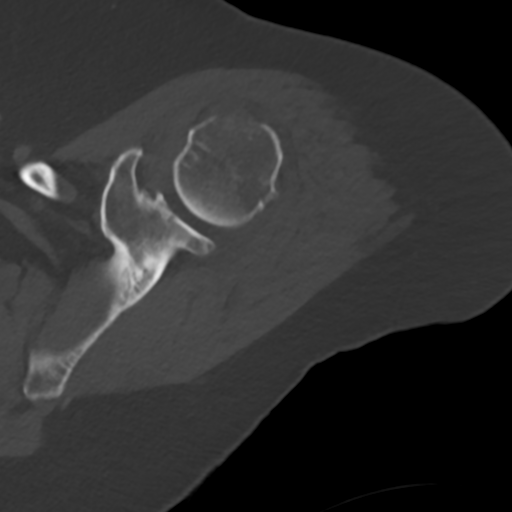
[im 56/73  bone]
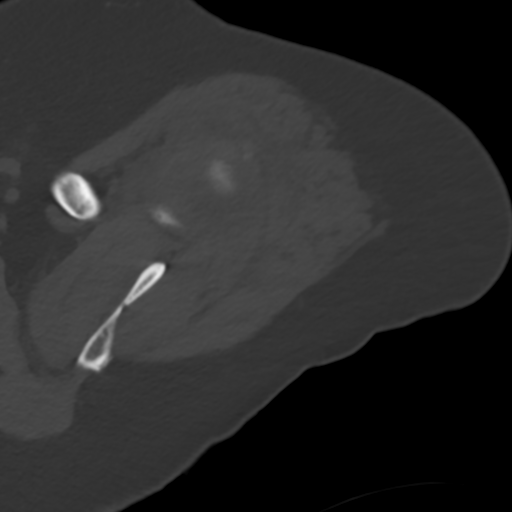
[im 61/73  bone]
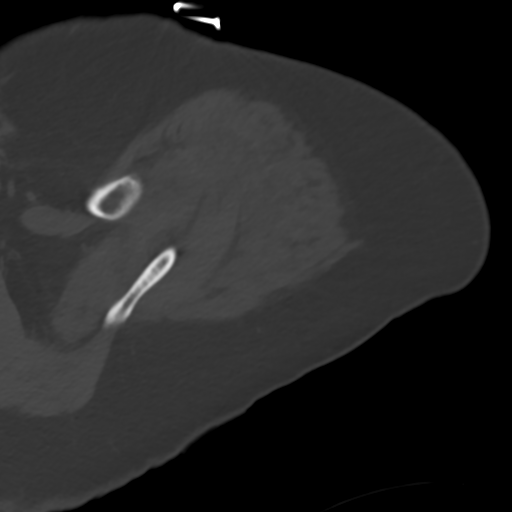
[im 67/73  bone]
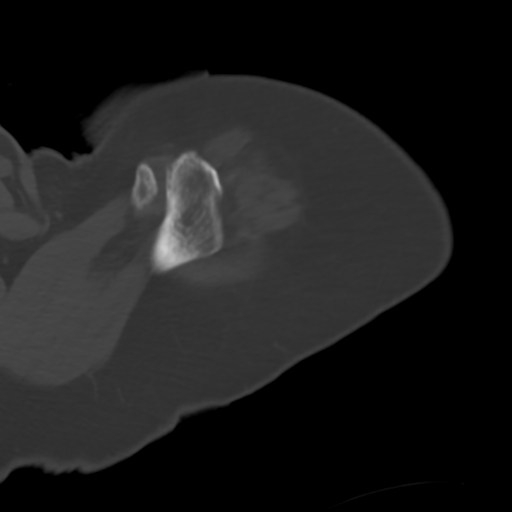

[12 of 14 positions shown; findings below may reference images not displayed]

FINDINGS: Bones/Joint/Cartilage

There is mild osteopenia. Again noted is a transverse oblique
surgical neck proximal left humeral fracture with impaction of the
distal fragment into the proximal fracture bed, anterior translation
of the main distal fragment up to 1 cm and similar degree of lateral
translation with mild posterior angulation of the distal fragment.

The humeral head articular surface is intact but there is separate
fracture with comminution along the greater tuberosity. There is a
slight spreading of some of these fragments.

There is a left glenohumeral joint hemarthrosis and partial inferior
subluxation of the humeral head such that the mid to lower humeral
head articular surface rests against the lower lip of the bony
glenoid.

Background degenerative arthrosis noted with mild narrowing and
spurring of the AC joint, mild narrowing spurring and subcortical
cystic changes of the of the inferior glenohumeral joint,
subcortical cystic changes of the posterior bony glenoid.

The scapula is intact.  The distal clavicle and AC joint are intact.

Ligaments

Suboptimally assessed by CT.

Muscles and Tendons

The distal supraspinatus tendon not well seen and could be partially
torn. Other rotator cuff tendons are grossly intact.

There is heterogeneity of the subscapularis myotendinous junction
which could indicate a myotendinous junction tear and intramuscular
hematoma.

No other focal intramuscular abnormality is visible by CT.

The intra-articular portion of the bicipital tendon is not seen and
could be torn as well.

Soft tissues

No axillary mass is seen. The visualized left lung fields are clear.
IMPRESSION: 1. Osteopenia with comminuted proximal left humeral fracture with
impaction. See above for full details.
2. Hemarthrosis with a partial inferior subluxation of the humeral
head.
3. Unable to visualize the distal supraspinatus tendon or the
intra-articular portion of the bicipital tendon both of which could
be torn.
4. Heterogeneity of the subscapularis myotendinous junction which
may be due to a myotendinous junction tear and small intramuscular
hematoma.
5. Background degenerative change.

## 2023-12-02 ENCOUNTER — Ambulatory Visit
Admission: EM | Admit: 2023-12-02 | Discharge: 2023-12-02 | Disposition: A | Discharge status: Home / Self Care | Attending: Nurse Practitioner | Admitting: Nurse Practitioner

## 2023-12-02 ENCOUNTER — Encounter: Payer: Self-pay | Admitting: Emergency Medicine

## 2023-12-02 DIAGNOSIS — R350 Frequency of micturition: Secondary | ICD-10-CM | POA: Insufficient documentation

## 2023-12-02 DIAGNOSIS — N3000 Acute cystitis without hematuria: Secondary | ICD-10-CM | POA: Insufficient documentation

## 2023-12-02 DIAGNOSIS — R102 Pelvic and perineal pain: Secondary | ICD-10-CM | POA: Insufficient documentation

## 2023-12-02 LAB — POCT URINALYSIS DIP (MANUAL ENTRY)
Bilirubin, UA: NEGATIVE
Blood, UA: NEGATIVE
Glucose, UA: NEGATIVE mg/dL
Ketones, POC UA: NEGATIVE mg/dL
Nitrite, UA: NEGATIVE
Protein Ur, POC: 30 mg/dL — AB
Spec Grav, UA: 1.025
Urobilinogen, UA: 0.2 U/dL
pH, UA: 6

## 2023-12-02 MED ORDER — KETOROLAC TROMETHAMINE 60 MG/2ML IM SOLN
60.0000 mg | Freq: Once | INTRAMUSCULAR | Status: AC
  Administered 2023-12-02: 60 mg via INTRAMUSCULAR

## 2023-12-02 MED ORDER — URIBEL 118 MG PO CAPS: 1.0000 | ORAL_CAPSULE | Freq: Four times a day (QID) | ORAL | 0 refills | Status: AC

## 2023-12-02 MED ORDER — NITROFURANTOIN MONOHYD MACRO 100 MG PO CAPS: 100.0000 mg | ORAL_CAPSULE | Freq: Two times a day (BID) | ORAL | 0 refills | Status: DC

## 2023-12-02 NOTE — ED Provider Notes (Signed)
 [facility]    CSN: [id number] Arrival date & time: 12/02/23  1356      History   Chief Complaint Chief Complaint  Patient presents with   Flank Pain   Back Pain   Abdominal Pain    HPI Sally [name] is a 63 y.o. female.   Sally [name] is a 63 year old female presenting with several days of lower back pain, lower abdominal cramping, urinary frequency, urinary urgency, and nausea. She denies fever, chills, body aches, dysuria, foul-smelling urine, vaginal discharge, or hematuria. She has a known history of renal calculi but no history of recurrent urinary tract infections or pyelonephritis.  The following portions of the patient's history were reviewed and updated as appropriate: allergies, current medications, past family history, past medical history, past social history, past surgical history, and problem list.      Past Medical History:  Diagnosis Date   ADD (attention deficit disorder)    Anemia    Cellulitis of left lower extremity    CFS (chronic fatigue syndrome)    Edema of both lower extremities    Fibromyalgia    GAD (generalized anxiety disorder)    GERD (gastroesophageal reflux disease)    History of colitis    History of pulmonary embolus (PE)    per pt in 2019 told probable PE left lung, completed blood thnner treatment,  stated no clot before or since   History of sepsis 11/17/2020   hospital admission, dx acute left pyelonephritis secondary to obstruction uropathy due to left ureter stone   History of sepsis 11/17/2020   hospital admission, dx septic shock , e.coli, acute pyelonephritis due to kidney stone obstruction   IBS (irritable bowel syndrome)    Left ureteral stone    MDD (major depressive disorder)    Mild persistent asthma    Osteoarthritis    RA (rheumatoid arthritis) (HCC)    rheumotology--- dr Rayann Cage   RLS (restless legs syndrome)    Spinal stenosis    Type 2 diabetes mellitus (HCC)    followed by pcp  (12-10-2020  pt stated checks daily in am,  fasting sugar-- 120--140)   Urgency of urination    Vitamin B 12 deficiency    Vitamin D  deficiency    Wears glasses     Patient Active Problem List   Diagnosis Date Noted   Gastroesophageal reflux disease 11/09/2022   Absolute anemia 04/16/2022   Cough variant asthma vs UACS 01/06/2021   Abnormal CT of the chest 01/06/2021   Upper airway cough syndrome 01/05/2021   Sepsis (HCC) 11/17/2020   Elevated blood pressure reading without diagnosis of hypertension 06/24/2020   Depression with anxiety 04/27/2020   Diabetes mellitus (HCC) 11/10/2019   Morbid obesity (HCC) 10/28/2019   Vitamin D  deficiency 10/23/2019   Colitis    RLS (restless legs syndrome)    IBS (irritable bowel syndrome)     Past Surgical History:  Procedure Laterality Date   COLONOSCOPY  2012   CYSTOSCOPY W/ URETERAL STENT PLACEMENT Left 11/17/2020   Procedure: CYSTOSCOPY LEFT WITH RETROGRADE PYELOGRAM/URETERAL STENT PLACEMENT LEFT DOUBLE J PLACEMENT ;  Surgeon: Trent Frizzle, MD;  Location: [name] OR;  Service: Urology;  Laterality: Left;   CYSTOSCOPY WITH RETROGRADE PYELOGRAM, URETEROSCOPY AND STENT PLACEMENT Left 12/13/2020   Procedure: CYSTOSCOPY WITH RETROGRADE PYELOGRAM, URETEROSCOPY AND STENT PLACEMENT;  Surgeon: Trent Frizzle, MD;  Location: [facility];  Service: Urology;  Laterality: Left;  75 MINS   EYE SURGERY  chlid  pt stated removal foreign body, unsure which eye   HOLMIUM LASER APPLICATION Left 12/13/2020   Procedure: HOLMIUM LASER APPLICATION;  Surgeon: Trent Frizzle, MD;  Location: [facility];  Service: Urology;  Laterality: Left;   MOUTH SURGERY      OB History     Gravida  6   Para  5   Term  5   Preterm      AB  1   Living  5      SAB      IAB      Ectopic      Multiple      Live Births               Home Medications    Prior to Admission medications   Medication Sig Start Date End Date  Taking? Authorizing Provider  Meth-Hyo-M Bl-Na Phos-Ph Sal (URIBEL) 118 MG CAPS Take 1 capsule (118 mg total) by mouth in the morning, at noon, in the evening, and at bedtime for 5 days. 12/02/23 12/07/23 Yes Persais Ethridge, FNP  nitrofurantoin, macrocrystal-monohydrate, (MACROBID) 100 MG capsule Take 1 capsule (100 mg total) by mouth 2 (two) times daily. 12/02/23  Yes Maryruth Sol, FNP  Acetaminophen  (TYLENOL  ARTHRITIS PAIN PO) Take by mouth.    [provider]  albuterol  (VENTOLIN  HFA) 108 (90 Base) MCG/ACT inhaler Inhale 2 puffs into the lungs every 6 (six) hours as needed. FOR WHEEZING 06/15/22   Diamond Formica, MD  Azelastine HCl 137 MCG/SPRAY SOLN Place 2 sprays into both nostrils 2 (two) times daily. 07/05/23   [provider]  benzonatate (TESSALON) 200 MG capsule Take 400 mg by mouth every 8 (eight) hours as needed. 07/05/23   [provider]  budesonide -formoterol  (SYMBICORT ) 80-4.5 MCG/ACT inhaler Take 2 puffs first thing in am and then another 2 puffs about 12 hours later. 06/15/22   Diamond Formica, MD  buPROPion  (WELLBUTRIN  XL) 300 MG 24 hr tablet Take 300 mg by mouth daily. 02/27/23   [provider]  cyanocobalamin (VITAMIN B12) 1000 MCG tablet Take 1 tablet (1,000 mcg total) by mouth daily. 11/09/22   Whitmire, Dawn, FNP  cyclobenzaprine  (FLEXERIL ) 10 MG tablet 3 (three) times daily as needed for muscle spasms.    [provider]  DULoxetine  (CYMBALTA ) 30 MG capsule Take 90 mg by mouth daily. 10/31/17   [provider]  ENBREL SURECLICK 50 MG/ML injection Inject into the skin. 08/05/20   [provider]  famotidine  (PEPCID ) 20 MG tablet Please schedule an appointment with the office for further refills. 06/15/22   Diamond Formica, MD  folic acid  (FOLVITE ) 1 MG tablet Take 1 mg by mouth daily.    [provider]  furosemide (LASIX) 20 MG tablet Take 20 mg by mouth daily as needed.    [provider]   gabapentin  (NEURONTIN ) 300 MG capsule Take 600 mg by mouth 3 (three) times daily. 2 capsules in the AM, 1 capsule at lunch as needed, 2 capsules at bedtime 10/22/17   [provider]  meloxicam (MOBIC) 15 MG tablet Take 1 tablet by mouth daily. 02/20/22   [provider]  metFORMIN  (GLUCOPHAGE -XR) 500 MG 24 hr tablet Take 1 tablet (500 mg total) by mouth 2 (two) times daily. 03/08/23   Whitmire, Dawn, FNP  methotrexate (RHEUMATREX) 2.5 MG tablet TAKE 8 TABLETS BY MOUTH  WEEKLY 03/16/22   [provider]  montelukast  (SINGULAIR ) 10 MG tablet Take 10 mg by mouth  at bedtime.    [provider]  omeprazole  (PRILOSEC) 40 MG capsule Take 1 capsule (40 mg total) by mouth daily. 11/09/22   Whitmire, Dawn, FNP  predniSONE  (STERAPRED UNI-PAK 48 TAB) 10 MG (48) TBPK tablet Take as directed. 07/19/23   Afton Albright, MD  Semaglutide, 1 MG/DOSE, (OZEMPIC, 1 MG/DOSE,) 2 MG/1.5ML SOPN Inject 1 mg into the skin once a week.    [provider]  traMADol  (ULTRAM ) 50 MG tablet Take 1 tablet (50 mg total) by mouth every 6 (six) hours as needed. 10/07/23   Guss Legacy, FNP  Vitamin D , Ergocalciferol , (DRISDOL ) 1.25 MG (50000 UNIT) CAPS capsule Take 1 capsule (50,000 Units total) by mouth every 7 (seven) days. 04/05/23   Whitmire, Dawn, FNP    Family History Family History  Problem Relation Age of Onset   Pneumonia Mother    Asthma Mother    Diabetes Mother    Hypertension Mother    Depression Mother    Anxiety disorder Mother    Eating disorder Mother    Obesity Mother    Sudden death Mother    Cancer Father        Brain tumor   Asthma Father     Social History Social History   Tobacco Use   Smoking status: Former    Current packs/day: 0.00    Average packs/day: 1.5 packs/day for 8.0 years (12.0 ttl pk-yrs)    Types: Cigarettes    Start date: 12/10/1988    Quit date: 12/10/1996    Years since quitting: 26.9   Smokeless tobacco: Never  Vaping Use   Vaping  status: Never Used  Substance Use Topics   Alcohol use: No    Alcohol/week: 0.0 standard drinks of alcohol   Drug use: No     Allergies   Patient has no known allergies.   Review of Systems Review of Systems  Constitutional:  Negative for fever.  Gastrointestinal:  Positive for abdominal pain and nausea. Negative for vomiting.  Genitourinary:  Positive for flank pain, frequency and urgency. Negative for dysuria, genital sores and vaginal discharge.  Musculoskeletal:  Positive for back pain.  All other systems reviewed and are negative.    Physical Exam Triage Vital Signs ED Triage Vitals  Encounter Vitals Group     BP 12/02/23 1423 111/70     Systolic BP Percentile --      Diastolic BP Percentile --      Pulse Rate 12/02/23 1423 79     Resp 12/02/23 1423 18     Temp 12/02/23 1423 98 F (36.7 C)     Temp Source 12/02/23 1423 Oral     SpO2 12/02/23 1423 97 %     Weight 12/02/23 1423 241 lb 2.9 oz (109.4 kg)     Height --      Head Circumference --      Peak Flow --      Pain Score 12/02/23 1421 6     Pain Loc --      Pain Education --      Exclude from Growth Chart --    No data found.  Updated Vital Signs BP 111/70 (BP Location: Right Arm)   Pulse 79   Temp 98 F (36.7 C) (Oral)   Resp 18   Wt 241 lb 2.9 oz (109.4 kg)   LMP 05/31/2012   SpO2 97%   BMI 42.72 kg/m   Visual Acuity Right Eye Distance:   Left Eye Distance:  Bilateral Distance:    Right Eye Near:   Left Eye Near:    Bilateral Near:     Physical Exam Vitals reviewed.  Constitutional:      General: She is awake. She is not in acute distress.    Appearance: Normal appearance. She is well-developed. She is not ill-appearing or toxic-appearing.  HENT:     Head: Normocephalic.     Mouth/Throat:     Mouth: Mucous membranes are moist.  Eyes:     Conjunctiva/sclera: Conjunctivae normal.  Cardiovascular:     Rate and Rhythm: Normal rate and regular rhythm.     Heart sounds: Normal heart  sounds.  Pulmonary:     Effort: Pulmonary effort is normal.     Breath sounds: Normal breath sounds.  Abdominal:     General: Bowel sounds are normal.     Palpations: Abdomen is soft.     Tenderness: There is no abdominal tenderness. There is no right CVA tenderness or left CVA tenderness.  Musculoskeletal:        General: Normal range of motion.  Skin:    General: Skin is warm and dry.  Neurological:     General: No focal deficit present.     Mental Status: She is alert and oriented to person, place, and time.  Psychiatric:        Behavior: Behavior is cooperative.      UC Treatments / Results  Labs (all labs ordered are listed, but only abnormal results are displayed) Labs Reviewed  POCT URINALYSIS DIP (MANUAL ENTRY) - Abnormal; Notable for the following components:      Result Value   Clarity, UA cloudy (*)    Protein Ur, POC =30 (*)    Leukocytes, UA Small (1+) (*)    All other components within normal limits  URINE CULTURE    EKG   Radiology No results found.  Procedures Procedures (including critical care time)  Medications Ordered in UC Medications  ketorolac  (TORADOL ) injection 60 mg (has no administration in time range)    Initial Impression / Assessment and Plan / UC Course  I have reviewed the triage vital signs and the nursing notes.  Pertinent labs & imaging results that were available during my care of the patient were reviewed by me and considered in my medical decision making (see chart for details).    63 year old female presenting with urinary symptoms consistent with a urinary tract infection for the past several days. She is afebrile and nontoxic. Physical exam is as noted above and without any focal findings. Urinalysis revealed cloudy urine with small leukocytes, but no blood or nitrites. A urine culture has been sent and is pending. She was given a Toradol  injection in the clinic for pain management. Macrobid and Uribel were prescribed.  Supportive care measures and indications for follow-up were reviewed in detail with the patient.  Today's evaluation has revealed no signs of a dangerous process. Discussed diagnosis with patient and/or guardian. Patient and/or guardian aware of their diagnosis, possible red flag symptoms to watch out for and need for close follow up. Patient and/or guardian understands verbal and written discharge instructions. Patient and/or guardian comfortable with plan and disposition.  Patient and/or guardian has a clear mental status at this time, good insight into illness (after discussion and teaching) and has clear judgment to make decisions regarding their care  Documentation was completed with the aid of voice recognition software. Transcription may contain typographical errors. Final Clinical Impressions(s) / UC Diagnoses  Final diagnoses:  Urinary frequency  Suprapubic pressure  Acute cystitis without hematuria     Discharge Instructions      You were seen today for symptoms consistent with a urinary tract infection (UTI). You were given an injection of Toradol  in the clinic which is a nonsteroidal anti-inflammatory medication used to reduce inflammation and relieve pain. You have been prescribed Macrobid to treat the infection and Uribel to help relieve discomfort such as burning, urgency, and bladder pressure. Take the antibiotics exactly as prescribed and complete the full course, even if you start feeling better. Uribel may cause your urine to change color, which is a normal side effect of the medication. A urine culture has been sent to identify the specific bacteria causing the infection and to confirm that the prescribed antibiotic is appropriate. You will only be contacted if your results are abnormal; otherwise, you may review them in your MyChart account.   It is important to stay well hydrated by drinking plenty of fluids throughout the day. This helps flush out your urinary system and  keeps your urine light yellow, which is a sign of good hydration. Avoid caffeine and alcohol, as they can irritate the bladder. Be sure to urinate regularly and empty your bladder fully. Do not hold your urine for extended periods. Always wipe from front to back after using the bathroom and use a clean tissue for each wipe. It is also important to urinate after sexual activity. Avoid douching or using sprays or powders in the genital area, as these can cause irritation. Follow up with your healthcare provider if your symptoms do not improve within a few days, get worse, or return after completing your treatment.        ED Prescriptions     Medication Sig Dispense Auth. Provider   nitrofurantoin, macrocrystal-monohydrate, (MACROBID) 100 MG capsule Take 1 capsule (100 mg total) by mouth 2 (two) times daily. 10 capsule Maryruth Sol, FNP   Meth-Hyo-M Aurea Blossom Phos-Ph Sal (URIBEL) 118 MG CAPS Take 1 capsule (118 mg total) by mouth in the morning, at noon, in the evening, and at bedtime for 5 days. 20 capsule Maryruth Sol, FNP      PDMP not reviewed this encounter.   Maryruth Sol, [place] 12/02/23 [phone]

## 2023-12-02 NOTE — ED Triage Notes (Addendum)
 Pt presents with UTI sxs x 2 days. Pt also c/o frequent urination as well. Pt also reports lack of appetite since last week.

## 2023-12-02 NOTE — Discharge Instructions (Addendum)
 You were seen today for symptoms consistent with a urinary tract infection (UTI). You were given an injection of Toradol  in the clinic which is a nonsteroidal anti-inflammatory medication used to reduce inflammation and relieve pain. You have been prescribed Macrobid to treat the infection and Uribel to help relieve discomfort such as burning, urgency, and bladder pressure. Take the antibiotics exactly as prescribed and complete the full course, even if you start feeling better. Uribel may cause your urine to change color, which is a normal side effect of the medication. A urine culture has been sent to identify the specific bacteria causing the infection and to confirm that the prescribed antibiotic is appropriate. You will only be contacted if your results are abnormal; otherwise, you may review them in your MyChart account.   It is important to stay well hydrated by drinking plenty of fluids throughout the day. This helps flush out your urinary system and keeps your urine light yellow, which is a sign of good hydration. Avoid caffeine and alcohol, as they can irritate the bladder. Be sure to urinate regularly and empty your bladder fully. Do not hold your urine for extended periods. Always wipe from front to back after using the bathroom and use a clean tissue for each wipe. It is also important to urinate after sexual activity. Avoid douching or using sprays or powders in the genital area, as these can cause irritation. Follow up with your healthcare provider if your symptoms do not improve within a few days, get worse, or return after completing your treatment.

## 2023-12-03 LAB — URINE CULTURE: Culture: NO GROWTH

## 2024-02-21 ENCOUNTER — Other Ambulatory Visit: Payer: Self-pay | Admitting: *Deleted

## 2024-02-21 DIAGNOSIS — I872 Venous insufficiency (chronic) (peripheral): Secondary | ICD-10-CM

## 2024-03-04 NOTE — H&P (Signed)
 Patient's anticipated LOS is less than 2 midnights, meeting these requirements: - Younger than 54 - Lives within 1 hour of care - Has a competent adult at home to recover with post-op recover - NO history of  - Chronic pain requiring opiods  - Diabetes  - Coronary Artery Disease  - Heart failure  - Heart attack  - Stroke  - DVT/VTE  - Cardiac arrhythmia  - Respiratory Failure/COPD  - Renal failure  - Anemia  - Advanced Liver disease     Sally [name] is an 63 y.o. female.    Chief Complaint: left shoulder pain  HPI: Pt is a 63 y.o. female complaining of left shoulder pain for multiple years. Pain had continually increased since the beginning. X-rays in the clinic show end-stage arthritic changes of the left shoulder. Pt has tried various conservative treatments which have failed to alleviate their symptoms, including injections and therapy. Various options are discussed with the patient. Risks, benefits and expectations were discussed with the patient. Patient understand the risks, benefits and expectations and wishes to proceed with surgery.   PCP:  Thurmond Cathlyn LABOR., MD  D/C Plans: Home  PMH: Past Medical History:  Diagnosis Date   ADD (attention deficit disorder)    Anemia    Cellulitis of left lower extremity    CFS (chronic fatigue syndrome)    Edema of both lower extremities    Fibromyalgia    GAD (generalized anxiety disorder)    GERD (gastroesophageal reflux disease)    History of colitis    History of pulmonary embolus (PE)    per pt in 2019 told probable PE left lung, completed blood thnner treatment,  stated no clot before or since   History of sepsis 11/17/2020   hospital admission, dx acute left pyelonephritis secondary to obstruction uropathy due to left ureter stone   History of sepsis 11/17/2020   hospital admission, dx septic shock , e.coli, acute pyelonephritis due to kidney stone obstruction   IBS (irritable bowel syndrome)    Left ureteral stone     MDD (major depressive disorder)    Mild persistent asthma    Osteoarthritis    RA (rheumatoid arthritis) (HCC)    rheumotology--- dr ivory   RLS (restless legs syndrome)    Spinal stenosis    Type 2 diabetes mellitus (HCC)    followed by pcp  (12-10-2020 pt stated checks daily in am,  fasting sugar-- 120--140)   Urgency of urination    Vitamin B 12 deficiency    Vitamin D  deficiency    Wears glasses     PSH: Past Surgical History:  Procedure Laterality Date   COLONOSCOPY  2012   CYSTOSCOPY W/ URETERAL STENT PLACEMENT Left 11/17/2020   Procedure: CYSTOSCOPY LEFT WITH RETROGRADE PYELOGRAM/URETERAL STENT PLACEMENT LEFT DOUBLE J PLACEMENT ;  Surgeon: Matilda Senior, MD;  Location: [facility] OR;  Service: Urology;  Laterality: Left;   CYSTOSCOPY WITH RETROGRADE PYELOGRAM, URETEROSCOPY AND STENT PLACEMENT Left 12/13/2020   Procedure: CYSTOSCOPY WITH RETROGRADE PYELOGRAM, URETEROSCOPY AND STENT PLACEMENT;  Surgeon: Matilda Senior, MD;  Location: [facility];  Service: Urology;  Laterality: Left;  75 MINS   EYE SURGERY  chlid   pt stated removal foreign body, unsure which eye   HOLMIUM LASER APPLICATION Left 12/13/2020   Procedure: HOLMIUM LASER APPLICATION;  Surgeon: Matilda Senior, MD;  Location: [facility];  Service: Urology;  Laterality: Left;   MOUTH SURGERY      Social History:  reports that she quit smoking about 27 years ago. Her smoking use included cigarettes. She started smoking about 35 years ago. She has a 12 pack-year smoking history. She has never used smokeless tobacco. She reports that she does not drink alcohol and does not use drugs. BMI: Estimated body mass index is 42.72 kg/m as calculated from the following:   Height as of 04/04/23: 5' 3 (1.6 m).   Weight as of 12/02/23: 109.4 kg.  Lab Results  Component Value Date   ALBUMIN  4.5 02/15/2023   Diabetes:   Patient has a diagnosis of diabetes,  Lab Results  Component Value Date    HGBA1C 6.0 (H) 10/12/2022   Smoking Status:      Allergies:  No Known Allergies  Medications: No current facility-administered medications for this encounter.   Current Outpatient Medications  Medication Sig Dispense Refill   Acetaminophen  (TYLENOL  ARTHRITIS PAIN PO) Take by mouth.     albuterol  (VENTOLIN  HFA) 108 (90 Base) MCG/ACT inhaler Inhale 2 puffs into the lungs every 6 (six) hours as needed. FOR WHEEZING 18 g 0   Azelastine HCl 137 MCG/SPRAY SOLN Place 2 sprays into both nostrils 2 (two) times daily.     benzonatate (TESSALON) 200 MG capsule Take 400 mg by mouth every 8 (eight) hours as needed.     budesonide -formoterol  (SYMBICORT ) 80-4.5 MCG/ACT inhaler Take 2 puffs first thing in am and then another 2 puffs about 12 hours later. 30.6 each 3   buPROPion  (WELLBUTRIN  XL) 300 MG 24 hr tablet Take 300 mg by mouth daily.     cyanocobalamin (VITAMIN B12) 1000 MCG tablet Take 1 tablet (1,000 mcg total) by mouth daily. 90 tablet 1   cyclobenzaprine  (FLEXERIL ) 10 MG tablet 3 (three) times daily as needed for muscle spasms.     DULoxetine  (CYMBALTA ) 30 MG capsule Take 90 mg by mouth daily.     ENBREL SURECLICK 50 MG/ML injection Inject into the skin.     famotidine  (PEPCID ) 20 MG tablet Please schedule an appointment with the office for further refills. 90 tablet 0   folic acid  (FOLVITE ) 1 MG tablet Take 1 mg by mouth daily.     furosemide (LASIX) 20 MG tablet Take 20 mg by mouth daily as needed.     gabapentin  (NEURONTIN ) 300 MG capsule Take 600 mg by mouth 3 (three) times daily. 2 capsules in the AM, 1 capsule at lunch as needed, 2 capsules at bedtime  0   meloxicam (MOBIC) 15 MG tablet Take 1 tablet by mouth daily.     metFORMIN  (GLUCOPHAGE -XR) 500 MG 24 hr tablet Take 1 tablet (500 mg total) by mouth 2 (two) times daily. 180 tablet 0   methotrexate (RHEUMATREX) 2.5 MG tablet TAKE 8 TABLETS BY MOUTH  WEEKLY     montelukast  (SINGULAIR ) 10 MG tablet Take 10 mg by mouth at bedtime.      nitrofurantoin , macrocrystal-monohydrate, (MACROBID ) 100 MG capsule Take 1 capsule (100 mg total) by mouth 2 (two) times daily. 10 capsule 0   omeprazole  (PRILOSEC) 40 MG capsule Take 1 capsule (40 mg total) by mouth daily. 90 capsule 1   predniSONE  (STERAPRED UNI-PAK 48 TAB) 10 MG (48) TBPK tablet Take as directed. 48 tablet 0   Semaglutide, 1 MG/DOSE, (OZEMPIC, 1 MG/DOSE,) 2 MG/1.5ML SOPN Inject 1 mg into the skin once a week.     traMADol  (ULTRAM ) 50 MG tablet Take 1 tablet (50 mg total) by mouth every 6 (six) hours as needed. 15 tablet 0  Vitamin D , Ergocalciferol , (DRISDOL ) 1.25 MG (50000 UNIT) CAPS capsule Take 1 capsule (50,000 Units total) by mouth every 7 (seven) days. 12 capsule 0    No results found for this or any previous visit (from the past 48 hours). No results found.  ROS: Pain with rom of the left upper extremity  Physical Exam: Alert and oriented 63 y.o. female in no acute distress Cranial nerves 2-12 intact Cervical spine: full rom with no tenderness, nv intact distally Chest: active breath sounds bilaterally, no wheeze rhonchi or rales Heart: regular rate and rhythm, no murmur Abd: non tender non distended with active bowel sounds Hip is stable with rom  Painful and weak rom of left shoulder Nv intact distally No rashes or edema distally  Assessment/Plan Assessment: left shoulder cuff arthropathy   Plan:  Patient will undergo a left reverse total shoulder by Dr. Kay at [place] Risks benefits and expectations were discussed with the patient. Patient understand risks, benefits and expectations and wishes to proceed. Preoperative templating of the joint replacement has been completed, documented, and submitted to the Operating Room personnel in order to optimize intra-operative equipment management.   Arvella Fireman PA-C, MPAS [facility] is now [employer] [address], [address], [place], [place] [address] Phone:  [phone] [email or web address] Facebook  [employer]

## 2024-03-05 ENCOUNTER — Ambulatory Visit: Attending: Vascular Surgery | Admitting: Physician Assistant

## 2024-03-05 ENCOUNTER — Ambulatory Visit (HOSPITAL_COMMUNITY)
Admission: RE | Admit: 2024-03-05 | Discharge: 2024-03-05 | Disposition: A | Discharge status: Home / Self Care | Source: Ambulatory Visit | Attending: Vascular Surgery | Admitting: Vascular Surgery

## 2024-03-05 VITALS — BP 111/75 | HR 73 | Temp 97.7°F | Wt 197.4 lb

## 2024-03-05 DIAGNOSIS — I872 Venous insufficiency (chronic) (peripheral): Secondary | ICD-10-CM | POA: Insufficient documentation

## 2024-03-05 NOTE — Progress Notes (Signed)
 [facility]           [facility] [place]  History and Physical   Sally [name] is a 63 y.o. female who presents with LLE swelling, itching and pain.  She was originally seen by Dr. Eliza on 04/04/2023 for LLE swelling.  She had gradual onset of leg swelling for several years and would somewhat come and go.  She did have some right leg swelling but mostly left leg swelling.  She did have some aching pain and heaviness associated with prolonged sitting and standing.  She did not have any hx of DVT but did have hx of PE in 2018 and was on Eliquis for about a year.  She was occasionally wearing thigh high compression.  She had hx of cellulitis in the LLE.  She did have hyperpigmentation and predominantly deep venous reflux with some superficial reflux.  He discussed conservative measures to help with her sx and discussed that should her sx worsen, we could re-evaluate with duplex.   She states that the swelling in the left leg has gotten worse.  She has pain in the lower portion of the leg.  She has achiness and itchiness and swelling in both legs but left is certainly worse than the right.  She does have hx of PE but they did not find the source of DVT.  She does have family hx of varicose veins.  She does have some superficial veins and one has bled in the past. She does have some skin color changes in the left lower leg.  She states she had some redness and was treated with abx and prednisone  and it did improve.  She has worn compression but did not feel they really worked.  She has had intentional weight loss of about 80 lbs.  She has never had any venous procedures in the past.   The pt is not on a statin for cholesterol management.  The pt is not on a daily aspirin.   Other AC:  none The pt is on diuretic for hypertension.   The pt is  on medication for diabetes.   Tobacco hx:  former  Pt does not have family hx of AAA.  Past Medical History:  Diagnosis Date   ADD  (attention deficit disorder)    Anemia    Cellulitis of left lower extremity    CFS (chronic fatigue syndrome)    Edema of both lower extremities    Fibromyalgia    GAD (generalized anxiety disorder)    GERD (gastroesophageal reflux disease)    History of colitis    History of pulmonary embolus (PE)    per pt in 2019 told probable PE left lung, completed blood thnner treatment,  stated no clot before or since   History of sepsis 11/17/2020   hospital admission, dx acute left pyelonephritis secondary to obstruction uropathy due to left ureter stone   History of sepsis 11/17/2020   hospital admission, dx septic shock , e.coli, acute pyelonephritis due to kidney stone obstruction   IBS (irritable bowel syndrome)    Left ureteral stone    MDD (major depressive disorder)    Mild persistent asthma    Osteoarthritis    RA (rheumatoid arthritis) (HCC)    rheumotology--- dr ivory   RLS (restless legs syndrome)    Spinal stenosis    Type 2 diabetes mellitus (HCC)    followed by pcp  (12-10-2020 pt stated checks  daily in am,  fasting sugar-- 120--140)   Urgency of urination    Vitamin B 12 deficiency    Vitamin D  deficiency    Wears glasses     Past Surgical History:  Procedure Laterality Date   COLONOSCOPY  2012   CYSTOSCOPY W/ URETERAL STENT PLACEMENT Left 11/17/2020   Procedure: CYSTOSCOPY LEFT WITH RETROGRADE PYELOGRAM/URETERAL STENT PLACEMENT LEFT DOUBLE J PLACEMENT ;  Surgeon: Matilda Senior, MD;  Location: [facility] OR;  Service: Urology;  Laterality: Left;   CYSTOSCOPY WITH RETROGRADE PYELOGRAM, URETEROSCOPY AND STENT PLACEMENT Left 12/13/2020   Procedure: CYSTOSCOPY WITH RETROGRADE PYELOGRAM, URETEROSCOPY AND STENT PLACEMENT;  Surgeon: Matilda Senior, MD;  Location: [facility];  Service: Urology;  Laterality: Left;  75 MINS   EYE SURGERY  chlid   pt stated removal foreign body, unsure which eye   HOLMIUM LASER APPLICATION Left 12/13/2020   Procedure: HOLMIUM  LASER APPLICATION;  Surgeon: Matilda Senior, MD;  Location: [facility];  Service: Urology;  Laterality: Left;   MOUTH SURGERY      Social History   Socioeconomic History   Marital status: Married    Spouse name: Brynda Heick   Number of children: 5   Years of education: Not on file   Highest education level: Not on file  Occupational History   Occupation: Building surveyor  Tobacco Use   Smoking status: Former    Current packs/day: 0.00    Average packs/day: 1.5 packs/day for 8.0 years (12.0 ttl pk-yrs)    Types: Cigarettes    Start date: 12/10/1988    Quit date: 12/10/1996    Years since quitting: 27.2   Smokeless tobacco: Never  Vaping Use   Vaping status: Never Used  Substance and Sexual Activity   Alcohol use: No    Alcohol/week: 0.0 standard drinks of alcohol   Drug use: No   Sexual activity: Not on file    Comment: First IC <16, Partners <5, No STDs  Other Topics Concern   Not on file  Social History Narrative   Not on file   Social Drivers of Health   Financial Resource Strain: Not on file  Food Insecurity: Low Risk  (12/11/2023)   Received from [facility]   Hunger Vital Sign    Within the past 12 months, you worried that your food would run out before you got money to buy more: Never true    Within the past 12 months, the food you bought just didn't last and you didn't have money to get more. : Never true  Transportation Needs: No Transportation Needs (12/11/2023)   Received from [facility]    In the past 12 months, has lack of reliable transportation kept you from medical appointments, meetings, work or from getting things needed for daily living? : No  Physical Activity: Not on file  Stress: Not on file  Social Connections: Not on file  Intimate Partner Violence: Not on file     Family History  Problem Relation Age of Onset   Pneumonia Mother    Asthma Mother    Diabetes Mother    Hypertension Mother     Depression Mother    Anxiety disorder Mother    Eating disorder Mother    Obesity Mother    Sudden death Mother    Cancer Father        Brain tumor   Asthma Father     Current Outpatient Medications  Medication Sig Dispense Refill  Acetaminophen  (TYLENOL  ARTHRITIS PAIN PO) Take by mouth.     albuterol  (VENTOLIN  HFA) 108 (90 Base) MCG/ACT inhaler Inhale 2 puffs into the lungs every 6 (six) hours as needed. FOR WHEEZING 18 g 0   Azelastine HCl 137 MCG/SPRAY SOLN Place 2 sprays into both nostrils 2 (two) times daily.     benzonatate (TESSALON) 200 MG capsule Take 400 mg by mouth every 8 (eight) hours as needed.     budesonide -formoterol  (SYMBICORT ) 80-4.5 MCG/ACT inhaler Take 2 puffs first thing in am and then another 2 puffs about 12 hours later. 30.6 each 3   buPROPion  (WELLBUTRIN  XL) 300 MG 24 hr tablet Take 300 mg by mouth daily.     cyanocobalamin (VITAMIN B12) 1000 MCG tablet Take 1 tablet (1,000 mcg total) by mouth daily. 90 tablet 1   cyclobenzaprine  (FLEXERIL ) 10 MG tablet 3 (three) times daily as needed for muscle spasms.     DULoxetine  (CYMBALTA ) 30 MG capsule Take 90 mg by mouth daily.     ENBREL SURECLICK 50 MG/ML injection Inject into the skin.     famotidine  (PEPCID ) 20 MG tablet Please schedule an appointment with the office for further refills. 90 tablet 0   folic acid  (FOLVITE ) 1 MG tablet Take 1 mg by mouth daily.     furosemide (LASIX) 20 MG tablet Take 20 mg by mouth daily as needed.     gabapentin  (NEURONTIN ) 300 MG capsule Take 600 mg by mouth 3 (three) times daily. 2 capsules in the AM, 1 capsule at lunch as needed, 2 capsules at bedtime  0   meloxicam (MOBIC) 15 MG tablet Take 1 tablet by mouth daily.     metFORMIN  (GLUCOPHAGE -XR) 500 MG 24 hr tablet Take 1 tablet (500 mg total) by mouth 2 (two) times daily. 180 tablet 0   methotrexate (RHEUMATREX) 2.5 MG tablet TAKE 8 TABLETS BY MOUTH  WEEKLY     montelukast  (SINGULAIR ) 10 MG tablet Take 10 mg by mouth at bedtime.      nitrofurantoin , macrocrystal-monohydrate, (MACROBID ) 100 MG capsule Take 1 capsule (100 mg total) by mouth 2 (two) times daily. 10 capsule 0   omeprazole  (PRILOSEC) 40 MG capsule Take 1 capsule (40 mg total) by mouth daily. 90 capsule 1   predniSONE  (STERAPRED UNI-PAK 48 TAB) 10 MG (48) TBPK tablet Take as directed. 48 tablet 0   Semaglutide, 1 MG/DOSE, (OZEMPIC, 1 MG/DOSE,) 2 MG/1.5ML SOPN Inject 1 mg into the skin once a week.     traMADol  (ULTRAM ) 50 MG tablet Take 1 tablet (50 mg total) by mouth every 6 (six) hours as needed. 15 tablet 0   Vitamin D , Ergocalciferol , (DRISDOL ) 1.25 MG (50000 UNIT) CAPS capsule Take 1 capsule (50,000 Units total) by mouth every 7 (seven) days. 12 capsule 0   No current facility-administered medications for this visit.    No Known Allergies  REVIEW OF SYSTEMS:   [X]  denotes positive finding, [ ]  denotes negative finding Cardiac  Comments:  Chest pain or chest pressure:    Shortness of breath upon exertion:    Short of breath when lying flat:    Irregular heart rhythm:        Vascular    Pain in calf, thigh, or hip brought on by ambulation:    Pain in feet at night that wakes you up from your sleep:     Blood clot in your veins:    Leg swelling:  x       Pulmonary  Oxygen at home:    Productive cough:     Wheezing:         Neurologic    Sudden weakness in arms or legs:     Sudden numbness in arms or legs:     Sudden onset of difficulty speaking or slurred speech:    Temporary loss of vision in one eye:     Problems with dizziness:         Gastrointestinal    Blood in stool:     Vomited blood:         Genitourinary    Burning when urinating:     Blood in urine:        Psychiatric    Major depression:         Hematologic    Bleeding problems:    Problems with blood clotting too easily:        Skin    Rashes or ulcers:        Constitutional    Fever or chills:      PHYSICAL EXAMINATION:  Today's Vitals   03/05/24  1254  BP: 111/75  Pulse: 73  Temp: 97.7 F (36.5 C)  TempSrc: Temporal  Weight: 197 lb 6.4 oz (89.5 kg)   Body mass index is 34.97 kg/m.   General:  WDWN in NAD; vital signs documented above Gait: Not observed HENT: WNL, normocephalic Pulmonary: normal non-labored breathing without wheezing Cardiac: regular HR; without carotid bruits Abdomen: soft, NT, aortic pulse is not palpable Skin: without rashes Vascular Exam/Pulses:  Right Left  Radial 2+ (normal) 2+ (normal)  DP Faintly palpable Multiphasic doppler Faintly palpable Multiphasic doppler  PT Faintly palpable Multiphasic doppler Faintly palpable Multiphasic doppler   Extremities: BLE swelling with left > right.  She does have some superficial varicosities bilaterally with left worse than right.   Neurologic: A&O X 3;  moving all extremities equally Psychiatric:  The pt has Normal affect.   Non-Invasive Vascular Imaging:   Venous duplex on 03/05/2024: +--------------+---------+------+-----------+------------+-------------+  LEFT         Reflux NoRefluxReflux TimeDiameter cmsComments                               Yes                                        +--------------+---------+------+-----------+------------+-------------+  CFV          no                                                   +--------------+---------+------+-----------+------------+-------------+  FV mid        no                                                   +--------------+---------+------+-----------+------------+-------------+  Popliteal    no                                                   +--------------+---------+------+-----------+------------+-------------+  GSV at [facility]              yes    >500 ms      0.49                   +--------------+---------+------+-----------+------------+-------------+  GSV prox thighno                            0.38                    +--------------+---------+------+-----------+------------+-------------+  GSV mid thigh no                            0.34                   +--------------+---------+------+-----------+------------+-------------+  GSV dist thighno                            0.30    out of fascia  +--------------+---------+------+-----------+------------+-------------+  GSV at knee             yes    >500 ms      0.27                   +--------------+---------+------+-----------+------------+-------------+  GSV prox calf no                            0.16                   +--------------+---------+------+-----------+------------+-------------+  GSV mid calf  no                            0.21                   +--------------+---------+------+-----------+------------+-------------+  GSV dist calf no                            0.26                   +--------------+---------+------+-----------+------------+-------------+  SSV at [facility]    no                            0.23                   +--------------+---------+------+-----------+------------+-------------+   Summary:  Left:  - No evidence of deep vein thrombosis seen in the left lower extremity, from the common femoral through the popliteal veins.  - No evidence of superficial venous thrombosis in the left lower extremity.  - No evidence of superficial venous reflux seen in the left short saphenous vein.  - Venous reflux is noted in the left sapheno-femoral junction and GSV at the knee.     Sally [name] is a 63 y.o. female who presents with: BLE swelling with left > right    -pt has faintly palpable pedal pulses bilaterally with multiphasic doppler flow -in the left lower extremity, the pt does not have evidence of DVT.  Pt does have venous reflux in the GSV at the [facility] and at the knee, otherwise, there is no reflux.  They vein at the knee measures 0.27cm and is  too small for consideration for  laser ablation.  She does have some hemosiderin staining on the left lower leg.  -discussed with pt about wearing knee high 15-20 mmHg compression stockings and pt was measured for these today since she has lost weight and wanted her to have the proper size.  -discussed the importance of leg elevation and how to elevate properly - pt is advised to elevate their legs and a diagram is given to them to demonstrate for pt to lay flat on their back with knees elevated and slightly bent with their feet higher than their knees, which puts their feet higher than their heart for 15 minutes per day.  If pt cannot lay flat, advised to lay as flat as possible.  Discussed this is the position to get in should she have bleeding from her superficial veins and hold single finger pressure for about 15-30 minutes.   -pt is advised to continue as much walking as possible and avoid sitting or standing for long periods of time.  -discussed importance of weight loss and exercise and that water aerobics would also be beneficial.  She has lost about 80 lbs intentionally and congratulated her on this as this is very difficult.    -handout with recommendations given -pt will f/u as needed.  She will call if she has concerns.    Lucie Apt, [facility] [phone]  Clinic MD:  Sheree

## 2024-03-10 NOTE — Patient Instructions (Addendum)
 SURGICAL WAITING ROOM VISITATION  Patients having surgery or a procedure may have no more than 2 support people in the waiting area - these visitors may rotate.    Children under the age of 74 must have an adult with them who is not the patient.  Visitors with respiratory illnesses are discouraged from visiting and should remain at home.  If the patient needs to stay at the hospital during part of their recovery, the visitor guidelines for inpatient rooms apply. Pre-op nurse will coordinate an appropriate time for 1 support person to accompany patient in pre-op.  This support person may not rotate.    Please refer to the [facility] website for the visitor guidelines for Inpatients (after your surgery is over and you are in a regular room).    Your procedure is scheduled on: 03/21/24   Report to [facility] Main Entrance    Report to admitting at 10:00 AM   Call this number if you have problems the morning of surgery [phone]   Do not eat food :After Midnight.   After Midnight you may have the following liquids until 9:30 AM DAY OF SURGERY  Water  Non-Citrus Juices (without pulp, NO RED-Apple, White grape, White cranberry) Black Coffee (NO MILK/CREAM OR CREAMERS, sugar ok)  Clear Tea (NO MILK/CREAM OR CREAMERS, sugar ok) regular and decaf                             Plain Jell-O (NO RED)                                           Fruit ices (not with fruit pulp, NO RED)                                     Popsicles (NO RED)                                                               Sports drinks like Gatorade (NO RED)     The day of surgery:  Drink ONE (1) Pre-Surgery G2 at 9:30 AM the morning of surgery. Drink in one sitting. Do not sip.  This drink was given to you during your hospital  pre-op appointment visit. Nothing else to drink after completing the  Pre-Surgery G2.          If you have questions, please contact your surgeon's office.   FOLLOW BOWEL  PREP AND ANY ADDITIONAL PRE OP INSTRUCTIONS YOU RECEIVED FROM YOUR SURGEON'S OFFICE!!!     Oral Hygiene is also important to reduce your risk of infection.                                    Remember - BRUSH YOUR TEETH THE MORNING OF SURGERY WITH YOUR REGULAR TOOTHPASTE  DENTURES WILL BE REMOVED PRIOR TO SURGERY PLEASE DO NOT APPLY Poly grip OR ADHESIVES!!!   Stop all vitamins and herbal supplements 7 days before surgery.  Take these medicines the morning of surgery with A SIP OF WATER : Tylenol , Inhalers, Bupropion , Duloxetine , Pepcid , Omeprazole , Tramadol    DO NOT TAKE ANY ORAL DIABETIC MEDICATIONS DAY OF YOUR SURGERY  How to Manage Your Diabetes Before and After Surgery  Why is it important to control my blood sugar before and after surgery? Improving blood sugar levels before and after surgery helps healing and can limit problems. A way of improving blood sugar control is eating a healthy diet by:  Eating less sugar and carbohydrates  Increasing activity/exercise  Talking with your doctor about reaching your blood sugar goals High blood sugars (greater than 180 mg/dL) can raise your risk of infections and slow your recovery, so you will need to focus on controlling your diabetes during the weeks before surgery. Make sure that the doctor who takes care of your diabetes knows about your planned surgery including the date and location.  How do I manage my blood sugar before surgery? Check your blood sugar at least 4 times a day, starting 2 days before surgery, to make sure that the level is not too high or low. Check your blood sugar the morning of your surgery when you wake up and every 2 hours until you get to the Short Stay unit. If your blood sugar is less than 70 mg/dL, you will need to treat for low blood sugar: Do not take insulin . Treat a low blood sugar (less than 70 mg/dL) with  cup of clear juice (cranberry or apple), 4 glucose tablets, OR glucose gel. Recheck blood  sugar in 15 minutes after treatment (to make sure it is greater than 70 mg/dL). If your blood sugar is not greater than 70 mg/dL on recheck, call [phone] for further instructions. Report your blood sugar to the short stay nurse when you get to Short Stay.  If you are admitted to the hospital after surgery: Your blood sugar will be checked by the staff and you will probably be given insulin  after surgery (instead of oral diabetes medicines) to make sure you have good blood sugar levels. The goal for blood sugar control after surgery is 80-180 mg/dL.   WHAT DO I DO ABOUT MY DIABETES MEDICATION?  Do not take oral diabetes medicines (pills) the morning of surgery.  Hold Ozempic  for 7 days prior to surgery. Do not take after 03/13/24  THE DAY BEFORE SURGERY, take Metformin  as prescribed.  THE MORNING OF SURGERY,  do not take Metformin     DO NOT TAKE THE FOLLOWING 7 DAYS PRIOR TO SURGERY: Ozempic , Wegovy , Rybelsus  (Semaglutide ), Byetta (exenatide), Bydureon (exenatide ER), Victoza, Saxenda (liraglutide), or Trulicity  (dulaglutide ) Mounjaro (Tirzepatide) Adlyxin (Lixisenatide), Polyethylene Glycol Loxenatide.  Reviewed and Endorsed by [facility] Patient Education Committee, August 2015                              You may not have any metal on your body including hair pins, jewelry, and body piercing             Do not wear make-up, lotions, powders, perfumes, or deodorant  Do not wear nail polish including gel and S&S, artificial/acrylic nails, or any other type of covering on natural nails including finger and toenails. If you have artificial nails, gel coating, etc. that needs to be removed by a nail salon please have this removed prior to surgery or surgery may need to be canceled/ delayed if the surgeon/ anesthesia feels like they are  unable to be safely monitored.   Do not shave  48 hours prior to surgery.    Do not bring valuables to the hospital. [facility] IS NOT              RESPONSIBLE   FOR VALUABLES.   Contacts, glasses, dentures or bridgework may not be worn into surgery.   Bring small overnight bag day of surgery.   DO NOT BRING YOUR HOME MEDICATIONS TO THE HOSPITAL. PHARMACY WILL DISPENSE MEDICATIONS LISTED ON YOUR MEDICATION LIST TO YOU DURING YOUR ADMISSION IN THE HOSPITAL!              Please read over the following fact sheets you were given: IF YOU HAVE QUESTIONS ABOUT YOUR PRE-OP INSTRUCTIONS PLEASE CALL [phone].   If you received a COVID test during your pre-op visit  it is requested that you wear a mask when out in public, stay away from anyone that may not be feeling well and notify your surgeon if you develop symptoms. If you test positive for Covid or have been in contact with anyone that has tested positive in the last 10 days please notify you surgeon.      Pre-operative 5 CHG Bath Instructions   You can play a key role in reducing the risk of infection after surgery. Your skin needs to be as free of germs as possible. You can reduce the number of germs on your skin by washing with CHG (chlorhexidine  gluconate) soap before surgery. CHG is an antiseptic soap that kills germs and continues to kill germs even after washing.   DO NOT use if you have an allergy to chlorhexidine /CHG or antibacterial soaps. If your skin becomes reddened or irritated, stop using the CHG and notify one of our RNs at [phone].   Please shower with the CHG soap starting 4 days before surgery using the following schedule:     Please keep in mind the following:  DO NOT shave, including legs and underarms, starting the day of your first shower.   You may shave your face at any point before/day of surgery.  Place clean sheets on your bed the day you start using CHG soap. Use a clean washcloth (not used since being washed) for each shower. DO NOT sleep with pets once you start using the CHG.   CHG Shower Instructions:  If you choose to wash your hair  and private area, wash first with your normal shampoo/soap.  After you use shampoo/soap, rinse your hair and body thoroughly to remove shampoo/soap residue.  Turn the water  OFF and apply about 3 tablespoons (45 ml) of CHG soap to a CLEAN washcloth.  Apply CHG soap ONLY FROM YOUR NECK DOWN TO YOUR TOES (washing for 3-5 minutes)  DO NOT use CHG soap on face, private areas, open wounds, or sores.  Pay special attention to the area where your surgery is being performed.  If you are having back surgery, having someone wash your back for you may be helpful. Wait 2 minutes after CHG soap is applied, then you may rinse off the CHG soap.  Pat dry with a clean towel  Put on clean clothes/pajamas   If you choose to wear lotion, please use ONLY the CHG-compatible lotions on the back of this paper.     Additional instructions for the day of surgery: DO NOT APPLY any lotions, deodorants, cologne, or perfumes.   Put on clean/comfortable clothes.  Brush your teeth.  Ask your nurse before  applying any prescription medications to the skin.      CHG Compatible Lotions   Aveeno Moisturizing lotion  Cetaphil Moisturizing Cream  Cetaphil Moisturizing Lotion  Clairol Herbal Essence Moisturizing Lotion, Dry Skin  Clairol Herbal Essence Moisturizing Lotion, Extra Dry Skin  Clairol Herbal Essence Moisturizing Lotion, Normal Skin  Curel Age Defying Therapeutic Moisturizing Lotion with Alpha Hydroxy  Curel Extreme Care Body Lotion  Curel Soothing Hands Moisturizing Hand Lotion  Curel Therapeutic Moisturizing Cream, Fragrance-Free  Curel Therapeutic Moisturizing Lotion, Fragrance-Free  Curel Therapeutic Moisturizing Lotion, Original Formula  Eucerin Daily Replenishing Lotion  Eucerin Dry Skin Therapy Plus Alpha Hydroxy Crme  Eucerin Dry Skin Therapy Plus Alpha Hydroxy Lotion  Eucerin Original Crme  Eucerin Original Lotion  Eucerin Plus Crme Eucerin Plus Lotion  Eucerin TriLipid Replenishing Lotion   Keri Anti-Bacterial Hand Lotion  Keri Deep Conditioning Original Lotion Dry Skin Formula Softly Scented  Keri Deep Conditioning Original Lotion, Fragrance Free Sensitive Skin Formula  Keri Lotion Fast Absorbing Fragrance Free Sensitive Skin Formula  Keri Lotion Fast Absorbing Softly Scented Dry Skin Formula  Keri Original Lotion  Keri Skin Renewal Lotion Keri Silky Smooth Lotion  Keri Silky Smooth Sensitive Skin Lotion  Nivea Body Creamy Conditioning Oil  Nivea Body Extra Enriched Lotion  Nivea Body Original Lotion  Nivea Body Sheer Moisturizing Lotion Nivea Crme  Nivea Skin Firming Lotion  NutraDerm 30 Skin Lotion  NutraDerm Skin Lotion  NutraDerm Therapeutic Skin Cream  NutraDerm Therapeutic Skin Lotion  ProShield Protective Hand Cream  Provon moisturizing lotion   Incentive Spirometer  An incentive spirometer is a tool that can help keep your lungs clear and active. This tool measures how well you are filling your lungs with each breath. Taking long deep breaths may help reverse or decrease the chance of developing breathing (pulmonary) problems (especially infection) following: A long period of time when you are unable to move or be active. BEFORE THE PROCEDURE  If the spirometer includes an indicator to show your best effort, your nurse or respiratory therapist will set it to a desired goal. If possible, sit up straight or lean slightly forward. Try not to slouch. Hold the incentive spirometer in an upright position. INSTRUCTIONS FOR USE  Sit on the edge of your bed if possible, or sit up as far as you can in bed or on a chair. Hold the incentive spirometer in an upright position. Breathe out normally. Place the mouthpiece in your mouth and seal your lips tightly around it. Breathe in slowly and as deeply as possible, raising the piston or the ball toward the top of the column. Hold your breath for 3-5 seconds or for as long as possible. Allow the piston or ball to fall to the  bottom of the column. Remove the mouthpiece from your mouth and breathe out normally. Rest for a few seconds and repeat Steps 1 through 7 at least 10 times every 1-2 hours when you are awake. Take your time and take a few normal breaths between deep breaths. The spirometer may include an indicator to show your best effort. Use the indicator as a goal to work toward during each repetition. After each set of 10 deep breaths, practice coughing to be sure your lungs are clear. If you have an incision (the cut made at the time of surgery), support your incision when coughing by placing a pillow or rolled up towels firmly against it. Once you are able to get out of bed, walk around indoors and  cough well. You may stop using the incentive spirometer when instructed by your caregiver.  RISKS AND COMPLICATIONS Take your time so you do not get dizzy or light-headed. If you are in pain, you may need to take or ask for pain medication before doing incentive spirometry. It is harder to take a deep breath if you are having pain. AFTER USE Rest and breathe slowly and easily. It can be helpful to keep track of a log of your progress. Your caregiver can provide you with a simple table to help with this. If you are using the spirometer at home, follow these instructions: SEEK MEDICAL CARE IF:  You are having difficultly using the spirometer. You have trouble using the spirometer as often as instructed. Your pain medication is not giving enough relief while using the spirometer. You develop fever of 100.5 F (38.1 C) or higher. SEEK IMMEDIATE MEDICAL CARE IF:  You cough up bloody sputum that had not been present before. You develop fever of 102 F (38.9 C) or greater. You develop worsening pain at or near the incision site. MAKE SURE YOU:  Understand these instructions. Will watch your condition. Will get help right away if you are not doing well or get worse. Document Released: 11/27/2006 Document Revised:  10/09/2011 Document Reviewed: 01/28/2007 Exit[employer] Patient Information 2014 [employer].   ________________________________________________________________________  [facility]- Preparing for Total Shoulder Arthroplasty    Before surgery, you can play an important role. Because skin is not sterile, your skin needs to be as free of germs as possible. You can reduce the number of germs on your skin by using the following products. Benzoyl Peroxide Gel Reduces the number of germs present on the skin Applied twice a day to shoulder area starting two days before surgery    ==================================================================  Please follow these instructions carefully:  BENZOYL PEROXIDE 5% GEL  Please do not use if you have an allergy to benzoyl peroxide.   If your skin becomes reddened/irritated stop using the benzoyl peroxide.  Starting two days before surgery, apply as follows: Apply benzoyl peroxide in the morning and at night. Apply after taking a shower. If you are not taking a shower clean entire shoulder front, back, and side along with the armpit with a clean wet washcloth.  Place a quarter-sized dollop on your shoulder and rub in thoroughly, making sure to cover the front, back, and side of your shoulder, along with the armpit.   2 days before ____ AM   ____ PM              1 day before ____ AM   ____ PM                         Do this twice a day for two days.  (Last application is the night before surgery, AFTER using the CHG soap as described below).  Do NOT apply benzoyl peroxide gel on the day of surgery.

## 2024-03-10 NOTE — Progress Notes (Addendum)
 COVID Vaccine Completed:  Date of COVID positive in last 90 days:  PCP - Cathlyn Nash, MD- sees [place] Cardiologist - n/a  Chest x-ray - 09/13/23 [facility] EKG - 03/11/24 Epic/chart Stress Test - n/a ECHO - n/a Cardiac Cath - n/a Pacemaker/ICD device last checked: n/a Spinal Cord Stimulator: n/a  Bowel Prep - no  Sleep Study - n/a CPAP -   Fasting Blood Sugar -  Checks Blood Sugar  not checking BS at home  Last dose of GLP1 agonist-  Ozempic   GLP1 instructions:  Do not take after     Last dose of SGLT-2 inhibitors-  N/A SGLT-2 instructions:  Do not take after     Blood Thinner Instructions:  Last dose: n/a  Time: Aspirin Instructions: Last Dose:  Activity level: Can go up a flight of stairs and perform activities of daily living without stopping and without symptoms of chest pain or shortness of breath.   Anesthesia review: PE, DM2, HTN, anemia  Patient denies shortness of breath, fever, cough and chest pain at PAT appointment  Patient verbalized understanding of instructions that were given to them at the PAT appointment. Patient was also instructed that they will need to review over the PAT instructions again at home before surgery.

## 2024-03-11 ENCOUNTER — Other Ambulatory Visit: Payer: Self-pay

## 2024-03-11 ENCOUNTER — Encounter (HOSPITAL_COMMUNITY)
Admission: RE | Admit: 2024-03-11 | Discharge: 2024-03-11 | Disposition: A | Discharge status: Home / Self Care | Source: Ambulatory Visit | Attending: Orthopedic Surgery | Admitting: Orthopedic Surgery

## 2024-03-11 ENCOUNTER — Encounter (HOSPITAL_COMMUNITY): Payer: Self-pay

## 2024-03-11 VITALS — BP 135/82 | HR 70 | Temp 97.9°F | Resp 16 | Ht 64.0 in | Wt 193.0 lb

## 2024-03-11 DIAGNOSIS — Z01818 Encounter for other preprocedural examination: Secondary | ICD-10-CM | POA: Insufficient documentation

## 2024-03-11 DIAGNOSIS — E1169 Type 2 diabetes mellitus with other specified complication: Secondary | ICD-10-CM | POA: Diagnosis not present

## 2024-03-11 HISTORY — DX: Pneumonia, unspecified organism: J18.9

## 2024-03-11 HISTORY — DX: Personal history of urinary calculi: Z87.442

## 2024-03-11 HISTORY — DX: Essential (primary) hypertension: I10

## 2024-03-11 LAB — SURGICAL PCR SCREEN
MRSA, PCR: NEGATIVE
Staphylococcus aureus: NEGATIVE

## 2024-03-11 LAB — BASIC METABOLIC PANEL WITH GFR
Anion gap: 8 (ref 5–15)
BUN: 12 mg/dL (ref 8–23)
CO2: 26 mmol/L (ref 22–32)
Calcium: 9.1 mg/dL (ref 8.9–10.3)
Chloride: 105 mmol/L (ref 98–111)
Creatinine, Ser: 0.73 mg/dL (ref 0.44–1.00)
GFR, Estimated: >60 mL/min (ref >60)
Glucose, Bld: 82 mg/dL (ref 70–99)
Potassium: 4.3 mmol/L (ref 3.5–5.1)
Sodium: 139 mmol/L (ref 135–145)

## 2024-03-11 LAB — CBC
HCT: 35.6 % — ABNORMAL LOW (ref 36.0–46.0)
Hemoglobin: 11.2 g/dL — ABNORMAL LOW (ref 12.0–15.0)
MCH: 30.2 pg (ref 26.0–34.0)
MCHC: 31.5 g/dL (ref 30.0–36.0)
MCV: 96 fL (ref 80.0–100.0)
Platelets: 370 K/uL (ref 150–400)
RBC: 3.71 MIL/uL — ABNORMAL LOW (ref 3.87–5.11)
RDW: 15.9 % — ABNORMAL HIGH (ref 11.5–15.5)
WBC: 7.5 K/uL (ref 4.0–10.5)
nRBC: 0 % (ref 0.0–0.2)

## 2024-03-11 LAB — GLUCOSE, CAPILLARY: Glucose-Capillary: 81 mg/dL (ref 70–99)

## 2024-03-12 LAB — HEMOGLOBIN A1C
Hgb A1c MFr Bld: 5.3 % (ref 4.8–5.6)
Mean Plasma Glucose: 105 mg/dL

## 2024-03-17 NOTE — Anesthesia Preprocedure Evaluation (Addendum)
 Anesthesia Evaluation  Patient identified by MRN, date of birth, ID band Patient awake    Reviewed: Allergy & Precautions, NPO status , Patient's Chart, lab work & pertinent test results  History of Anesthesia Complications Negative for: history of anesthetic complications  Airway Mallampati: II  TM Distance: >3 FB Neck ROM: Full    Dental no notable dental hx. (+) Upper Dentures, Dental Advisory Given   Pulmonary asthma , former smoker, PE   Pulmonary exam normal breath sounds clear to auscultation       Cardiovascular hypertension, (-) angina (-) Past MI Normal cardiovascular exam Rhythm:Regular Rate:Normal     Neuro/Psych  PSYCHIATRIC DISORDERS Anxiety Depression     Neuromuscular disease    GI/Hepatic Neg liver ROS,GERD  Medicated,,  Endo/Other  diabetes, Type 2, Oral Hypoglycemic Agents  Patient on GLP-1 Agonist  Renal/GU negative Renal ROSLab Results      Component                Value               Date                                  K                        4.3                 03/11/2024                CO2                      26                  03/11/2024                BUN                      12                  03/11/2024                CREATININE               0.73                03/11/2024                GFRNONAA                 >60                 03/11/2024                         Musculoskeletal  (+) Arthritis ,  Fibromyalgia -  Abdominal   Peds  Hematology  (+) Blood dyscrasia, anemia Lab Results      Component                Value               Date                      WBC                      7.5  03/11/2024                HGB                      11.2 (L)            03/11/2024                HCT                      35.6 (L)            03/11/2024                MCV                      96.0                03/11/2024                PLT                      370                  03/11/2024               Anesthesia Other Findings Other closed nondisplaced 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  NKDA  Reproductive/Obstetrics                              Anesthesia Physical Anesthesia Plan  ASA: 2  Anesthesia Plan: General and Regional   Post-op Pain Management: Ofirmev  IV (intra-op)* and Minimal or no pain anticipated   Induction: Intravenous  PONV Risk Score and Plan: Treatment may vary due to age or medical condition, Midazolam  and Ondansetron   Airway Management Planned: Oral ETT  Additional Equipment: None  Intra-op Plan:   Post-operative Plan: Extubation in OR  Informed Consent: I have reviewed the patients History and Physical, chart, labs and discussed the procedure including the risks, benefits and alternatives for the proposed anesthesia with the patient or authorized representative who has indicated his/her understanding and acceptance.     Dental advisory given  Plan Discussed with: CRNA and Surgeon  Anesthesia Plan Comments: (See PAT note 03/11/24  GA + L ISB w exparel )         Anesthesia Quick Evaluation

## 2024-03-17 NOTE — Progress Notes (Signed)
 Anesthesia Chart Review   Case: [id number] Date/Time: 03/21/24 1213   Procedure: ARTHROPLASTY, SHOULDER, TOTAL, REVERSE (Left: Shoulder)   Anesthesia type: General   Diagnosis: Traumatic closed nondisplaced fracture of anatomical neck of humerus with malunion, left [S42.295P]   Pre-op diagnosis: Other closed nondisplaced 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, subsequent encounter   Location: [facility] ROOM 06 / [facility]   Surgeons: Kay Kemps, MD       DISCUSSION:63 y.o. former smoker with h/o GERD, HTN, asthma, fibromyalgia, RA, PE 2019, DM II (A1C 5.3), closed nondisplaced 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 scheduled for above procedure 03/21/24 with Dr. Kemps Kay.   Pt follows with vascular surgery for lower extremity edema, left greater than right.  Last seen 03/05/24. VAS US  03/05/24 with no evidence of DVT, venous reflux noted left sapheno-femoral junction and GSV at the knee. Pt advised to follow up with vascular prn.   Rheumatology clearance received, per notes pt to hold methotrexate  and enbrel  one week before surgery.   Clearance received from PCP which states pt is low risk for planned procedure. Pt reports she can climb a flight of stairs without difficulty, denies chest pain or shortness of breath. Poor r wave progression on EKG, this is unchanged from previous EKG. Discussed with Dr. Patrisha, ok to proceed.   Pt advised to hold Ozempic  1 week prior to surgery.   VS: BP 135/82   Pulse 70   Temp 36.6 C (Oral)   Resp 16   Ht 5' 4 (1.626 m)   Wt 87.5 kg   LMP 05/31/2012   SpO2 100%   BMI 33.13 kg/m   PROVIDERS: Thurmond Cathlyn LABOR., MD is PCP    LABS: Labs reviewed: Acceptable for surgery. (all labs ordered are listed, but only abnormal results are displayed)  Labs Reviewed  CBC - Abnormal; Notable for the following components:      Result Value   RBC 3.71 (*)    Hemoglobin 11.2 (*)    HCT 35.6 (*)    RDW 15.9 (*)    All other  components within normal limits  SURGICAL PCR SCREEN  HEMOGLOBIN A1C  BASIC METABOLIC PANEL WITH GFR  GLUCOSE, CAPILLARY     IMAGES:   EKG:   CV:  Past Medical History:  Diagnosis Date   ADD (attention deficit disorder)    Anemia    Cellulitis of left lower extremity    CFS (chronic fatigue syndrome)    Edema of both lower extremities    Fibromyalgia    GAD (generalized anxiety disorder)    GERD (gastroesophageal reflux disease)    History of colitis    History of kidney stones    History of pulmonary embolus (PE)    per pt in 2019 told probable PE left lung, completed blood thnner treatment,  stated no clot before or since   History of sepsis 11/17/2020   hospital admission, dx acute left pyelonephritis secondary to obstruction uropathy due to left ureter stone   History of sepsis 11/17/2020   hospital admission, dx septic shock , e.coli, acute pyelonephritis due to kidney stone obstruction   Hypertension    IBS (irritable bowel syndrome)    Left ureteral stone    MDD (major depressive disorder)    Mild persistent asthma    Osteoarthritis    Pneumonia    RA (rheumatoid arthritis) (HCC)    rheumotology--- dr ivory   RLS (restless legs syndrome)  Spinal stenosis    Type 2 diabetes mellitus (HCC)    followed by pcp  (12-10-2020 pt stated checks daily in am,  fasting sugar-- 120--140)   Urgency of urination    Vitamin B 12 deficiency    Vitamin D  deficiency    Wears glasses     Past Surgical History:  Procedure Laterality Date   COLONOSCOPY  2012   CYSTOSCOPY W/ URETERAL STENT PLACEMENT Left 11/17/2020   Procedure: CYSTOSCOPY LEFT WITH RETROGRADE PYELOGRAM/URETERAL STENT PLACEMENT LEFT DOUBLE J PLACEMENT ;  Surgeon: Matilda Senior, MD;  Location: [facility] OR;  Service: Urology;  Laterality: Left;   CYSTOSCOPY WITH RETROGRADE PYELOGRAM, URETEROSCOPY AND STENT PLACEMENT Left 12/13/2020   Procedure: CYSTOSCOPY WITH RETROGRADE PYELOGRAM, URETEROSCOPY AND STENT  PLACEMENT;  Surgeon: Matilda Senior, MD;  Location: [facility];  Service: Urology;  Laterality: Left;  75 MINS   EYE SURGERY  chlid   pt stated removal foreign body, unsure which eye   HOLMIUM LASER APPLICATION Left 12/13/2020   Procedure: HOLMIUM LASER APPLICATION;  Surgeon: Matilda Senior, MD;  Location: [facility];  Service: Urology;  Laterality: Left;   MOUTH SURGERY      MEDICATIONS:  Acetaminophen  (TYLENOL  ARTHRITIS PAIN PO)   albuterol  (VENTOLIN  HFA) 108 (90 Base) MCG/ACT inhaler   Azelastine  HCl 137 MCG/SPRAY SOLN   benzonatate  (TESSALON ) 200 MG capsule   budesonide -formoterol  (SYMBICORT ) 80-4.5 MCG/ACT inhaler   buPROPion  (WELLBUTRIN  XL) 300 MG 24 hr tablet   cyanocobalamin (VITAMIN B12) 1000 MCG tablet   cyclobenzaprine  (FLEXERIL ) 10 MG tablet   DULoxetine  (CYMBALTA ) 30 MG capsule   ENBREL  SURECLICK 50 MG/ML injection   famotidine  (PEPCID ) 20 MG tablet   folic acid  (FOLVITE ) 1 MG tablet   furosemide  (LASIX ) 40 MG tablet   gabapentin  (NEURONTIN ) 300 MG capsule   meloxicam (MOBIC) 15 MG tablet   metFORMIN  (GLUCOPHAGE ) 500 MG tablet   methotrexate  (RHEUMATREX) 2.5 MG tablet   montelukast  (SINGULAIR ) 10 MG tablet   omeprazole  (PRILOSEC) 20 MG capsule   pravastatin  (PRAVACHOL ) 10 MG tablet   Semaglutide , 1 MG/DOSE, 2 MG/1.5ML SOPN   traMADol  (ULTRAM ) 50 MG tablet   Vitamin D , Ergocalciferol , (DRISDOL ) 1.25 MG (50000 UNIT) CAPS capsule   No current facility-administered medications for this encounter.    Harlene Hoots Ward, PA-C [facility] [phone]

## 2024-03-21 ENCOUNTER — Other Ambulatory Visit: Payer: Self-pay

## 2024-03-21 ENCOUNTER — Ambulatory Visit (HOSPITAL_BASED_OUTPATIENT_CLINIC_OR_DEPARTMENT_OTHER): Payer: Self-pay | Admitting: Anesthesiology

## 2024-03-21 ENCOUNTER — Encounter (HOSPITAL_COMMUNITY)
Admission: RE | Disposition: A | Discharge status: Home / Self Care | Payer: Self-pay | Source: Home / Self Care | Attending: Orthopedic Surgery

## 2024-03-21 ENCOUNTER — Observation Stay (HOSPITAL_COMMUNITY)
Admission: RE | Admit: 2024-03-21 | Discharge: 2024-03-22 | Disposition: A | Discharge status: Home / Self Care | Attending: Orthopedic Surgery | Admitting: Orthopedic Surgery

## 2024-03-21 ENCOUNTER — Observation Stay (HOSPITAL_COMMUNITY)

## 2024-03-21 ENCOUNTER — Encounter (HOSPITAL_COMMUNITY): Payer: Self-pay | Admitting: Orthopedic Surgery

## 2024-03-21 ENCOUNTER — Ambulatory Visit (HOSPITAL_COMMUNITY): Payer: Self-pay | Admitting: Medical

## 2024-03-21 DIAGNOSIS — S42202P Unspecified fracture of upper end of left humerus, subsequent encounter for fracture with malunion: Secondary | ICD-10-CM

## 2024-03-21 DIAGNOSIS — I10 Essential (primary) hypertension: Secondary | ICD-10-CM | POA: Diagnosis not present

## 2024-03-21 DIAGNOSIS — Z7984 Long term (current) use of oral hypoglycemic drugs: Secondary | ICD-10-CM | POA: Insufficient documentation

## 2024-03-21 DIAGNOSIS — Z87891 Personal history of nicotine dependence: Secondary | ICD-10-CM | POA: Insufficient documentation

## 2024-03-21 DIAGNOSIS — M25512 Pain in left shoulder: Secondary | ICD-10-CM | POA: Diagnosis present

## 2024-03-21 DIAGNOSIS — M75122 Complete rotator cuff tear or rupture of left shoulder, not specified as traumatic: Principal | ICD-10-CM | POA: Insufficient documentation

## 2024-03-21 DIAGNOSIS — E119 Type 2 diabetes mellitus without complications: Secondary | ICD-10-CM | POA: Diagnosis not present

## 2024-03-21 DIAGNOSIS — Z79899 Other long term (current) drug therapy: Secondary | ICD-10-CM | POA: Insufficient documentation

## 2024-03-21 DIAGNOSIS — E1169 Type 2 diabetes mellitus with other specified complication: Secondary | ICD-10-CM

## 2024-03-21 DIAGNOSIS — J45909 Unspecified asthma, uncomplicated: Secondary | ICD-10-CM | POA: Insufficient documentation

## 2024-03-21 DIAGNOSIS — Z96612 Presence of left artificial shoulder joint: Principal | ICD-10-CM

## 2024-03-21 HISTORY — PX: REVERSE SHOULDER ARTHROPLASTY: SHX5054

## 2024-03-21 LAB — GLUCOSE, CAPILLARY
Glucose-Capillary: 84 mg/dL (ref 70–99)
Glucose-Capillary: 91 mg/dL (ref 70–99)

## 2024-03-21 SURGERY — ARTHROPLASTY, SHOULDER, TOTAL, REVERSE
Anesthesia: Regional | Site: Shoulder | Laterality: Left

## 2024-03-21 MED ORDER — METHOTREXATE 2.5 MG PO TABS: 2.5000 mg | ORAL_TABLET | ORAL | Status: DC

## 2024-03-21 MED ORDER — FENTANYL CITRATE PF 50 MCG/ML IJ SOSY
50.0000 ug | PREFILLED_SYRINGE | INTRAMUSCULAR | Status: DC
  Administered 2024-03-21: 50 ug via INTRAVENOUS
  Filled 2024-03-21: qty 2

## 2024-03-21 MED ORDER — FENTANYL CITRATE (PF) 100 MCG/2ML IJ SOLN
INTRAMUSCULAR | Status: DC | PRN
  Administered 2024-03-21: 50 ug via INTRAVENOUS

## 2024-03-21 MED ORDER — STERILE WATER FOR IRRIGATION IR SOLN
Status: DC | PRN
  Administered 2024-03-21: 2000 mL

## 2024-03-21 MED ORDER — INSULIN ASPART 100 UNIT/ML IJ SOLN: 0.0000 [IU] | INTRAMUSCULAR | Status: DC | PRN

## 2024-03-21 MED ORDER — ONDANSETRON HCL 4 MG/2ML IJ SOLN
INTRAMUSCULAR | Status: DC | PRN
  Administered 2024-03-21: 4 mg via INTRAVENOUS

## 2024-03-21 MED ORDER — PHENOL 1.4 % MT LIQD: 1.0000 | OROMUCOSAL | Status: DC | PRN

## 2024-03-21 MED ORDER — ONDANSETRON HCL 4 MG/2ML IJ SOLN: 4.0000 mg | Freq: Once | INTRAMUSCULAR | Status: DC | PRN

## 2024-03-21 MED ORDER — PRAVASTATIN SODIUM 20 MG PO TABS
10.0000 mg | ORAL_TABLET | Freq: Every day | ORAL | Status: DC
  Administered 2024-03-22: 10 mg via ORAL
  Filled 2024-03-21: qty 1

## 2024-03-21 MED ORDER — ONDANSETRON HCL 4 MG PO TABS: 4.0000 mg | ORAL_TABLET | Freq: Four times a day (QID) | ORAL | Status: DC | PRN

## 2024-03-21 MED ORDER — SEMAGLUTIDE (1 MG/DOSE) 2 MG/1.5ML ~~LOC~~ SOPN: 2.0000 mg | PEN_INJECTOR | SUBCUTANEOUS | Status: DC

## 2024-03-21 MED ORDER — OXYCODONE HCL 5 MG/5ML PO SOLN: 5.0000 mg | Freq: Once | ORAL | Status: AC | PRN

## 2024-03-21 MED ORDER — DIPHENHYDRAMINE HCL 50 MG/ML IJ SOLN
INTRAMUSCULAR | Status: DC | PRN
  Administered 2024-03-21: 12.5 mg via INTRAVENOUS

## 2024-03-21 MED ORDER — LIDOCAINE HCL (CARDIAC) PF 100 MG/5ML IV SOSY
PREFILLED_SYRINGE | INTRAVENOUS | Status: DC | PRN
  Administered 2024-03-21: 100 mg via INTRAVENOUS

## 2024-03-21 MED ORDER — DOCUSATE SODIUM 100 MG PO CAPS
100.0000 mg | ORAL_CAPSULE | Freq: Two times a day (BID) | ORAL | Status: DC
  Administered 2024-03-21 – 2024-03-22 (×2): 100 mg via ORAL
  Filled 2024-03-21 (×2): qty 1

## 2024-03-21 MED ORDER — ONDANSETRON HCL 4 MG/2ML IJ SOLN
INTRAMUSCULAR | Status: AC
  Filled 2024-03-21: qty 2

## 2024-03-21 MED ORDER — FAMOTIDINE 20 MG PO TABS
20.0000 mg | ORAL_TABLET | Freq: Every day | ORAL | Status: DC
  Administered 2024-03-21: 20 mg via ORAL
  Filled 2024-03-21: qty 1

## 2024-03-21 MED ORDER — TRANEXAMIC ACID-NACL 1000-0.7 MG/100ML-% IV SOLN
1000.0000 mg | Freq: Once | INTRAVENOUS | Status: AC
  Administered 2024-03-21: 1000 mg via INTRAVENOUS
  Filled 2024-03-21: qty 100

## 2024-03-21 MED ORDER — DEXAMETHASONE SODIUM PHOSPHATE 10 MG/ML IJ SOLN
INTRAMUSCULAR | Status: AC
  Filled 2024-03-21: qty 1

## 2024-03-21 MED ORDER — FOLIC ACID 1 MG PO TABS
1.0000 mg | ORAL_TABLET | Freq: Every day | ORAL | Status: DC
  Administered 2024-03-22: 1 mg via ORAL
  Filled 2024-03-21: qty 1

## 2024-03-21 MED ORDER — PHENYLEPHRINE HCL-NACL 20-0.9 MG/250ML-% IV SOLN
INTRAVENOUS | Status: DC | PRN
  Administered 2024-03-21: 25 ug/min via INTRAVENOUS

## 2024-03-21 MED ORDER — PHENYLEPHRINE 80 MCG/ML (10ML) SYRINGE FOR IV PUSH (FOR BLOOD PRESSURE SUPPORT)
PREFILLED_SYRINGE | INTRAVENOUS | Status: AC
  Filled 2024-03-21: qty 10

## 2024-03-21 MED ORDER — BUPIVACAINE LIPOSOME 1.3 % IJ SUSP
INTRAMUSCULAR | Status: DC | PRN
  Administered 2024-03-21: 10 mL

## 2024-03-21 MED ORDER — BUPROPION HCL ER (XL) 300 MG PO TB24
300.0000 mg | ORAL_TABLET | Freq: Every day | ORAL | Status: DC
  Administered 2024-03-22: 300 mg via ORAL
  Filled 2024-03-21: qty 1

## 2024-03-21 MED ORDER — DEXMEDETOMIDINE HCL IN NACL 80 MCG/20ML IV SOLN
INTRAVENOUS | Status: DC | PRN
  Administered 2024-03-21: 8 ug via INTRAVENOUS

## 2024-03-21 MED ORDER — METOCLOPRAMIDE HCL 5 MG PO TABS: 5.0000 mg | ORAL_TABLET | Freq: Three times a day (TID) | ORAL | Status: DC | PRN

## 2024-03-21 MED ORDER — 0.9 % SODIUM CHLORIDE (POUR BTL) OPTIME
TOPICAL | Status: DC | PRN
  Administered 2024-03-21: 1000 mL

## 2024-03-21 MED ORDER — ACETAMINOPHEN 10 MG/ML IV SOLN
INTRAVENOUS | Status: DC | PRN
  Administered 2024-03-21: 1000 mg via INTRAVENOUS

## 2024-03-21 MED ORDER — BUPIVACAINE HCL (PF) 0.5 % IJ SOLN
INTRAMUSCULAR | Status: DC | PRN
  Administered 2024-03-21: 15 mL

## 2024-03-21 MED ORDER — TRANEXAMIC ACID-NACL 1000-0.7 MG/100ML-% IV SOLN
1000.0000 mg | INTRAVENOUS | Status: AC
  Administered 2024-03-21: 1000 mg via INTRAVENOUS
  Filled 2024-03-21: qty 100

## 2024-03-21 MED ORDER — OXYCODONE HCL 5 MG PO TABS
ORAL_TABLET | ORAL | Status: AC
Start: 2024-03-21 — End: 2024-03-21
  Filled 2024-03-21: qty 1

## 2024-03-21 MED ORDER — SODIUM CHLORIDE 0.9 % IV SOLN: INTRAVENOUS | Status: DC

## 2024-03-21 MED ORDER — FENTANYL CITRATE PF 50 MCG/ML IJ SOSY: 25.0000 ug | PREFILLED_SYRINGE | INTRAMUSCULAR | Status: DC | PRN

## 2024-03-21 MED ORDER — CYCLOBENZAPRINE HCL 10 MG PO TABS: 10.0000 mg | ORAL_TABLET | Freq: Three times a day (TID) | ORAL | 0 refills | Status: AC | PRN

## 2024-03-21 MED ORDER — HYDROCODONE-ACETAMINOPHEN 5-325 MG PO TABS: 1.0000 | ORAL_TABLET | Freq: Four times a day (QID) | ORAL | 0 refills | Status: AC | PRN

## 2024-03-21 MED ORDER — GLYCOPYRROLATE 0.2 MG/ML IJ SOLN
INTRAMUSCULAR | Status: AC
  Filled 2024-03-21: qty 1

## 2024-03-21 MED ORDER — POLYETHYLENE GLYCOL 3350 17 G PO PACK: 17.0000 g | PACK | Freq: Every day | ORAL | Status: DC | PRN

## 2024-03-21 MED ORDER — PANTOPRAZOLE SODIUM 40 MG PO TBEC
40.0000 mg | DELAYED_RELEASE_TABLET | Freq: Every day | ORAL | Status: DC
  Administered 2024-03-22: 40 mg via ORAL
  Filled 2024-03-21: qty 1

## 2024-03-21 MED ORDER — DEXAMETHASONE SODIUM PHOSPHATE 10 MG/ML IJ SOLN
INTRAMUSCULAR | Status: DC | PRN
  Administered 2024-03-21: 10 mg via INTRAVENOUS

## 2024-03-21 MED ORDER — SUCCINYLCHOLINE CHLORIDE 200 MG/10ML IV SOSY
PREFILLED_SYRINGE | INTRAVENOUS | Status: DC | PRN
  Administered 2024-03-21: 100 mg via INTRAVENOUS

## 2024-03-21 MED ORDER — DIPHENHYDRAMINE HCL 50 MG/ML IJ SOLN
INTRAMUSCULAR | Status: AC
  Filled 2024-03-21: qty 1

## 2024-03-21 MED ORDER — BENZONATATE 100 MG PO CAPS: 400.0000 mg | ORAL_CAPSULE | Freq: Three times a day (TID) | ORAL | Status: DC | PRN

## 2024-03-21 MED ORDER — CEFAZOLIN SODIUM-DEXTROSE 2-4 GM/100ML-% IV SOLN
2.0000 g | INTRAVENOUS | Status: AC
  Administered 2024-03-21: 2 g via INTRAVENOUS
  Filled 2024-03-21: qty 100

## 2024-03-21 MED ORDER — EPHEDRINE 5 MG/ML INJ
INTRAVENOUS | Status: AC
  Filled 2024-03-21: qty 5

## 2024-03-21 MED ORDER — VITAMIN B-12 1000 MCG PO TABS
1000.0000 ug | ORAL_TABLET | Freq: Every day | ORAL | Status: DC
  Administered 2024-03-22: 1000 ug via ORAL
  Filled 2024-03-21: qty 1

## 2024-03-21 MED ORDER — LACTATED RINGERS IV SOLN: INTRAVENOUS | Status: DC

## 2024-03-21 MED ORDER — MIDAZOLAM HCL 2 MG/2ML IJ SOLN
1.0000 mg | INTRAMUSCULAR | Status: DC
  Administered 2024-03-21: 2 mg via INTRAVENOUS
  Filled 2024-03-21: qty 2

## 2024-03-21 MED ORDER — ALBUTEROL SULFATE (2.5 MG/3ML) 0.083% IN NEBU: 2.5000 mg | INHALATION_SOLUTION | Freq: Four times a day (QID) | RESPIRATORY_TRACT | Status: DC | PRN

## 2024-03-21 MED ORDER — CEFAZOLIN SODIUM-DEXTROSE 2-4 GM/100ML-% IV SOLN
2.0000 g | Freq: Four times a day (QID) | INTRAVENOUS | Status: AC
  Administered 2024-03-21 – 2024-03-22 (×2): 2 g via INTRAVENOUS
  Filled 2024-03-21 (×2): qty 100

## 2024-03-21 MED ORDER — PROPOFOL 10 MG/ML IV BOLUS
INTRAVENOUS | Status: DC | PRN
Start: 2024-03-21 — End: 2024-03-21
  Administered 2024-03-21: 80 mg via INTRAVENOUS

## 2024-03-21 MED ORDER — DULOXETINE HCL 60 MG PO CPEP
90.0000 mg | ORAL_CAPSULE | Freq: Every day | ORAL | Status: DC
  Administered 2024-03-21 – 2024-03-22 (×2): 90 mg via ORAL
  Filled 2024-03-21 (×2): qty 1

## 2024-03-21 MED ORDER — MORPHINE SULFATE (PF) 2 MG/ML IV SOLN: 0.5000 mg | INTRAVENOUS | Status: DC | PRN

## 2024-03-21 MED ORDER — METOCLOPRAMIDE HCL 5 MG/ML IJ SOLN: 5.0000 mg | Freq: Three times a day (TID) | INTRAMUSCULAR | Status: DC | PRN

## 2024-03-21 MED ORDER — ACETAMINOPHEN 325 MG PO TABS: 325.0000 mg | ORAL_TABLET | Freq: Four times a day (QID) | ORAL | Status: DC | PRN

## 2024-03-21 MED ORDER — CHLORHEXIDINE GLUCONATE 0.12 % MT SOLN
15.0000 mL | Freq: Once | OROMUCOSAL | Status: AC
  Administered 2024-03-21: 15 mL via OROMUCOSAL

## 2024-03-21 MED ORDER — FENTANYL CITRATE (PF) 100 MCG/2ML IJ SOLN
INTRAMUSCULAR | Status: AC
  Filled 2024-03-21: qty 2

## 2024-03-21 MED ORDER — LIDOCAINE HCL (PF) 2 % IJ SOLN
INTRAMUSCULAR | Status: AC
  Filled 2024-03-21: qty 5

## 2024-03-21 MED ORDER — HYDROCODONE-ACETAMINOPHEN 5-325 MG PO TABS
1.0000 | ORAL_TABLET | ORAL | Status: DC | PRN
  Administered 2024-03-21: 1 via ORAL
  Filled 2024-03-21: qty 1

## 2024-03-21 MED ORDER — ACETAMINOPHEN 10 MG/ML IV SOLN
INTRAVENOUS | Status: AC
  Filled 2024-03-21: qty 100

## 2024-03-21 MED ORDER — GABAPENTIN 300 MG PO CAPS
600.0000 mg | ORAL_CAPSULE | Freq: Two times a day (BID) | ORAL | Status: DC
  Administered 2024-03-21 – 2024-03-22 (×2): 600 mg via ORAL
  Filled 2024-03-21 (×2): qty 2

## 2024-03-21 MED ORDER — FLUTICASONE FUROATE-VILANTEROL 100-25 MCG/ACT IN AEPB: 1.0000 | INHALATION_SPRAY | Freq: Every day | RESPIRATORY_TRACT | Status: DC

## 2024-03-21 MED ORDER — METFORMIN HCL 500 MG PO TABS
500.0000 mg | ORAL_TABLET | Freq: Two times a day (BID) | ORAL | Status: DC
  Administered 2024-03-21 – 2024-03-22 (×2): 500 mg via ORAL
  Filled 2024-03-21 (×2): qty 1

## 2024-03-21 MED ORDER — PHENYLEPHRINE HCL-NACL 20-0.9 MG/250ML-% IV SOLN
INTRAVENOUS | Status: AC
  Filled 2024-03-21: qty 250

## 2024-03-21 MED ORDER — MENTHOL 3 MG MT LOZG: 1.0000 | LOZENGE | OROMUCOSAL | Status: DC | PRN

## 2024-03-21 MED ORDER — SUCCINYLCHOLINE CHLORIDE 200 MG/10ML IV SOSY
PREFILLED_SYRINGE | INTRAVENOUS | Status: AC
  Filled 2024-03-21: qty 10

## 2024-03-21 MED ORDER — BUPIVACAINE-EPINEPHRINE (PF) 0.25% -1:200000 IJ SOLN
INTRAMUSCULAR | Status: AC
  Filled 2024-03-21: qty 30

## 2024-03-21 MED ORDER — ACETAMINOPHEN 10 MG/ML IV SOLN: 1000.0000 mg | Freq: Once | INTRAVENOUS | Status: DC | PRN

## 2024-03-21 MED ORDER — ONDANSETRON HCL 4 MG PO TABS: 4.0000 mg | ORAL_TABLET | Freq: Three times a day (TID) | ORAL | 1 refills | Status: AC | PRN

## 2024-03-21 MED ORDER — VANCOMYCIN HCL 1 G IV SOLR
INTRAVENOUS | Status: DC | PRN
  Administered 2024-03-21: 1000 mg via TOPICAL

## 2024-03-21 MED ORDER — VANCOMYCIN HCL 1000 MG IV SOLR
INTRAVENOUS | Status: AC
  Filled 2024-03-21: qty 20

## 2024-03-21 MED ORDER — AZELASTINE HCL 0.1 % NA SOLN: 1.0000 | Freq: Two times a day (BID) | NASAL | Status: DC | PRN

## 2024-03-21 MED ORDER — VITAMIN D (ERGOCALCIFEROL) 1.25 MG (50000 UNIT) PO CAPS: 50000.0000 [IU] | ORAL_CAPSULE | ORAL | Status: DC

## 2024-03-21 MED ORDER — ORAL CARE MOUTH RINSE: 15.0000 mL | Freq: Once | OROMUCOSAL | Status: AC

## 2024-03-21 MED ORDER — AMISULPRIDE (ANTIEMETIC) 5 MG/2ML IV SOLN: 10.0000 mg | Freq: Once | INTRAVENOUS | Status: DC | PRN

## 2024-03-21 MED ORDER — BUPIVACAINE-EPINEPHRINE (PF) 0.25% -1:200000 IJ SOLN
INTRAMUSCULAR | Status: DC | PRN
  Administered 2024-03-21: 30 mL

## 2024-03-21 MED ORDER — FUROSEMIDE 40 MG PO TABS
40.0000 mg | ORAL_TABLET | Freq: Every day | ORAL | Status: DC
  Filled 2024-03-21: qty 1

## 2024-03-21 MED ORDER — ONDANSETRON HCL 4 MG/2ML IJ SOLN: 4.0000 mg | Freq: Four times a day (QID) | INTRAMUSCULAR | Status: DC | PRN

## 2024-03-21 MED ORDER — CYCLOBENZAPRINE HCL 10 MG PO TABS: 10.0000 mg | ORAL_TABLET | Freq: Three times a day (TID) | ORAL | Status: DC | PRN

## 2024-03-21 MED ORDER — GLYCOPYRROLATE 0.2 MG/ML IJ SOLN
INTRAMUSCULAR | Status: DC | PRN
  Administered 2024-03-21 (×2): .1 mg via INTRAVENOUS

## 2024-03-21 MED ORDER — PROPOFOL 10 MG/ML IV BOLUS
INTRAVENOUS | Status: AC
  Filled 2024-03-21: qty 20

## 2024-03-21 MED ORDER — OXYCODONE HCL 5 MG PO TABS
5.0000 mg | ORAL_TABLET | Freq: Once | ORAL | Status: AC | PRN
  Administered 2024-03-21: 5 mg via ORAL

## 2024-03-21 MED ORDER — AZELASTINE HCL 137 MCG/SPRAY NA SOLN: 2.0000 | Freq: Two times a day (BID) | NASAL | Status: DC | PRN

## 2024-03-21 MED ORDER — ETANERCEPT 50 MG/ML ~~LOC~~ SOAJ: 50.0000 mg | SUBCUTANEOUS | Status: DC

## 2024-03-21 MED ORDER — TRAMADOL HCL 50 MG PO TABS
50.0000 mg | ORAL_TABLET | Freq: Four times a day (QID) | ORAL | Status: DC | PRN
  Administered 2024-03-21: 50 mg via ORAL
  Filled 2024-03-21: qty 1

## 2024-03-21 MED ORDER — MONTELUKAST SODIUM 10 MG PO TABS
10.0000 mg | ORAL_TABLET | Freq: Every day | ORAL | Status: DC
  Administered 2024-03-21: 10 mg via ORAL
  Filled 2024-03-21: qty 1

## 2024-03-21 MED ORDER — ALBUTEROL SULFATE HFA 108 (90 BASE) MCG/ACT IN AERS: 2.0000 | INHALATION_SPRAY | Freq: Four times a day (QID) | RESPIRATORY_TRACT | Status: DC | PRN

## 2024-03-21 SURGICAL SUPPLY — 58 items
BAG COUNTER SPONGE SURGICOUNT (BAG) IMPLANT
BAG ZIPLOCK 12X15 (MISCELLANEOUS) IMPLANT
BASEPLATE GLENOSPHERE 25 (Plate) IMPLANT
BIT DRILL 1.6MX128 (BIT) IMPLANT
BIT DRILL TWIST 2.7 (BIT) IMPLANT
BLADE SAG 18X100X1.27 (BLADE) ×1 IMPLANT
COMP HUM UNI IDENTI 36 +0 (Joint) IMPLANT
COVER BACK TABLE 60X90IN (DRAPES) ×1 IMPLANT
COVER SURGICAL LIGHT HANDLE (MISCELLANEOUS) ×1 IMPLANT
DRAPE INCISE IOBAN 66X45 STRL (DRAPES) ×1 IMPLANT
DRAPE POUCH INSTRU U-SHP 10X18 (DRAPES) ×1 IMPLANT
DRAPE SHEET LG 3/4 BI-LAMINATE (DRAPES) ×1 IMPLANT
DRAPE SURG ORHT 6 SPLT 77X108 (DRAPES) ×2 IMPLANT
DRAPE TOP 10253 STERILE (DRAPES) ×1 IMPLANT
DRAPE U-SHAPE 47X51 STRL (DRAPES) ×1 IMPLANT
DRSG ADAPTIC 3X8 NADH LF (GAUZE/BANDAGES/DRESSINGS) ×1 IMPLANT
DRSG AQUACEL AG ADV 3.5X10 (GAUZE/BANDAGES/DRESSINGS) ×1 IMPLANT
DURAPREP 26ML APPLICATOR (WOUND CARE) ×1 IMPLANT
ELECT BLADE TIP CTD 4 INCH (ELECTRODE) ×1 IMPLANT
ELECT NDL TIP 2.8 STRL (NEEDLE) ×1 IMPLANT
ELECT NEEDLE TIP 2.8 STRL (NEEDLE) ×1 IMPLANT
ELECT PENCIL ROCKER SW 15FT (MISCELLANEOUS) ×1 IMPLANT
ELECT REM PT RETURN 15FT ADLT (MISCELLANEOUS) ×1 IMPLANT
FACESHIELD WRAPAROUND OR TEAM (MASK) ×1 IMPLANT
GAUZE PAD ABD 8X10 STRL (GAUZE/BANDAGES/DRESSINGS) ×1 IMPLANT
GAUZE SPONGE 4X4 12PLY STRL (GAUZE/BANDAGES/DRESSINGS) ×1 IMPLANT
GLENOID SPHERE 36MM CVD +3 (Orthopedic Implant) IMPLANT
GLOVE BIOGEL PI IND STRL 7.5 (GLOVE) ×1 IMPLANT
GLOVE BIOGEL PI IND STRL 8.5 (GLOVE) ×1 IMPLANT
GLOVE ORTHO TXT STRL SZ7.5 (GLOVE) ×1 IMPLANT
GLOVE SURG ORTHO 8.5 STRL (GLOVE) ×1 IMPLANT
GOWN STRL REUS W/ TWL XL LVL3 (GOWN DISPOSABLE) ×2 IMPLANT
KIT BASIN OR (CUSTOM PROCEDURE TRAY) ×1 IMPLANT
KIT TURNOVER KIT A (KITS) ×1 IMPLANT
MANIFOLD NEPTUNE II (INSTRUMENTS) ×1 IMPLANT
NDL MAYO CATGUT SZ4 TPR NDL (NEEDLE) IMPLANT
NEEDLE MAYO CATGUT SZ4 (NEEDLE) IMPLANT
NS IRRIG 1000ML POUR BTL (IV SOLUTION) ×1 IMPLANT
PACK SHOULDER (CUSTOM PROCEDURE TRAY) ×1 IMPLANT
PIN FIX HEX IDENTITY 2.7X100 (PIN) IMPLANT
PIN THREADED REVERSE (PIN) IMPLANT
RESTRAINT HEAD UNIVERSAL NS (MISCELLANEOUS) ×1 IMPLANT
SCREW BONE LOCKING 4.75X30X3.5 (Screw) IMPLANT
SCREW BONE STRL 6.5MMX25MM (Screw) IMPLANT
SLING ARM FOAM STRAP LRG (SOFTGOODS) IMPLANT
SPIKE FLUID TRANSFER (MISCELLANEOUS) ×1 IMPLANT
STEM HUM IDENTITY SHLD STD 10 (Stem) IMPLANT
STRIP CLOSURE SKIN 1/2X4 (GAUZE/BANDAGES/DRESSINGS) ×1 IMPLANT
SUT MNCRL AB 4-0 PS2 18 (SUTURE) ×1 IMPLANT
SUT VIC AB 0 CT1 36 (SUTURE) ×1 IMPLANT
SUT VIC AB 0 CT2 27 (SUTURE) ×1 IMPLANT
SUT VIC AB 2-0 CT1 TAPERPNT 27 (SUTURE) ×1 IMPLANT
SUTURE FIBERWR #2 38 T-5 BLUE (SUTURE) ×2 IMPLANT
SUTURE FIBERWR#2 38 REV NDL BL (SUTURE) IMPLANT
TOWEL GREEN STERILE FF (TOWEL DISPOSABLE) ×1 IMPLANT
TOWEL OR 17X26 10 PK STRL BLUE (TOWEL DISPOSABLE) ×1 IMPLANT
TRAY HUMERAL NEUTRAL EXT 6 (Shoulder) IMPLANT
YANKAUER SUCT BULB TIP NO VENT (SUCTIONS) ×1 IMPLANT

## 2024-03-21 NOTE — Anesthesia Procedure Notes (Signed)
 Procedure Name: Intubation Date/Time: 03/21/2024 12:43 PM  Performed by: Nada Corean CROME, CRNAPre-anesthesia Checklist: Emergency Drugs available, Patient identified, Suction available, Timeout performed and Patient being monitored Patient Re-evaluated:Patient Re-evaluated prior to induction Oxygen Delivery Method: Circle system utilized Preoxygenation: Pre-oxygenation with 100% oxygen Induction Type: IV induction Ventilation: Mask ventilation without difficulty Laryngoscope Size: Mac and 3 Grade View: Grade I Tube type: Oral Tube size: 6.5 mm Number of attempts: 1 Airway Equipment and Method: Stylet Placement Confirmation: ETT inserted through vocal cords under direct vision, positive ETCO2 and breath sounds checked- equal and bilateral Secured at: 20 cm Tube secured with: Tape Dental Injury: Teeth and Oropharynx as per pre-operative assessment

## 2024-03-21 NOTE — Discharge Instructions (Signed)
 Ice to the shoulder constantly.  Keep the incision covered and clean and dry for one week with the Aquacel which is waterproof. Remove that bandage in one week and leave incision open to air.   Do exercise as instructed several times per day.  DO NOT reach behind your back or push up out of a chair with the operative arm.  Use a sling while you are up and around for comfort, may remove while seated.  Keep pillow propped behind the operative elbow.  Follow up with Dr Kay in two weeks in the office, call [phone] for appt  Please call Dr Kay' cell phone at [phone] with any questions or concerns

## 2024-03-21 NOTE — Anesthesia Postprocedure Evaluation (Signed)
 Anesthesia Post Note  Patient: Sally [name]  Procedure(s) Performed: ARTHROPLASTY, SHOULDER, TOTAL, REVERSE (Left: Shoulder)     Patient location during evaluation: PACU Anesthesia Type: Regional and General Level of consciousness: awake and alert Pain management: pain level controlled Vital Signs Assessment: post-procedure vital signs reviewed and stable Respiratory status: spontaneous breathing, nonlabored ventilation, respiratory function stable and patient connected to nasal cannula oxygen Cardiovascular status: blood pressure returned to baseline and stable Postop Assessment: no apparent nausea or vomiting Anesthetic complications: no   No notable events documented.  Last Vitals:  Vitals:   03/21/24 1821 03/21/24 2134  BP: 117/61 113/61  Pulse: 67 70  Resp: 16 16  Temp: (!) 36.4 C 36.4 C  SpO2: 97% 94%    Last Pain:  Vitals:   03/21/24 2118  TempSrc:   PainSc: 7                  Garnette DELENA Gab

## 2024-03-21 NOTE — Anesthesia Procedure Notes (Signed)
 Anesthesia Regional Block: Interscalene brachial plexus block   Pre-Anesthetic Checklist: , timeout performed,  Correct Patient, Correct Site, Correct Laterality,  Correct Procedure, Correct Position, site marked,  Risks and benefits discussed,  Surgical consent,  Pre-op evaluation,  At surgeon's request and post-op pain management  Laterality: Upper and Left  Prep: Maximum Sterile Barrier Precautions used, chloraprep       Needles:  Injection technique: Single-shot  Needle Type: Echogenic Needle     Needle Length: 5cm  Needle Gauge: 21     Additional Needles:   Procedures:,,,, ultrasound used (permanent image in chart),,    Narrative:  Start time: 03/21/2024 11:41 AM End time: 03/21/2024 11:46 AM Injection made incrementally with aspirations every 5 mL.  Performed by: Personally  Anesthesiologist: Jefm Garnette LABOR, MD  Additional Notes: Block assessed prior to procedure. Patient tolerated procedure well.

## 2024-03-21 NOTE — Brief Op Note (Signed)
 03/21/2024  2:46 PM  PATIENT:  Sally [name]  62 y.o. female  PRE-OPERATIVE DIAGNOSIS:  Other closed nondisplaced 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, subsequent encounter  POST-OPERATIVE DIAGNOSIS:  Other closed nondisplaced 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, subsequent encounter  PROCEDURE:  Procedure(s): ARTHROPLASTY, SHOULDER, TOTAL, REVERSE (Left) Biomet Comprehensive/Identity implant  SURGEON:  Surgeons and Role:    DEWAINE Kay Kemps, MD - Primary  PHYSICIAN ASSISTANT:   ASSISTANTS: Debby KATHEE Fireman, PA-C   ANESTHESIA:   regional and general  EBL:  200 mL   BLOOD ADMINISTERED:none  DRAINS: none   LOCAL MEDICATIONS USED:  MARCAINE      SPECIMEN:  No Specimen  DISPOSITION OF SPECIMEN:  N/A  COUNTS:  YES  TOURNIQUET:  * No tourniquets in log *  DICTATION: .Other Dictation: Dictation Number [id number]  PLAN OF CARE: Admit for overnight observation  PATIENT DISPOSITION:  PACU - hemodynamically stable.   Delay start of Pharmacological VTE agent (>24hrs) due to surgical blood loss or risk of bleeding: not applicable

## 2024-03-21 NOTE — Transfer of Care (Signed)
 Immediate Anesthesia Transfer of Care Note  Patient: Sally [name]  Procedure(s) Performed: ARTHROPLASTY, SHOULDER, TOTAL, REVERSE (Left: Shoulder)  Patient Location: PACU  Anesthesia Type:General and Regional  Level of Consciousness: drowsy, patient cooperative, and responds to stimulation  Airway & Oxygen Therapy: Patient Spontanous Breathing and Patient connected to face mask oxygen  Post-op Assessment: Report given to RN and Post -op Vital signs reviewed and stable  Post vital signs: Reviewed and stable  Last Vitals:  Vitals Value Taken Time  BP 118/82 03/21/24 14:40  Temp    Pulse 81 03/21/24 14:45  Resp 12 03/21/24 14:45  SpO2 100 % 03/21/24 14:45  Vitals shown include unfiled device data.  Last Pain: There were no vitals filed for this visit.       Complications: No notable events documented.

## 2024-03-21 NOTE — Plan of Care (Signed)

## 2024-03-21 NOTE — Interval H&P Note (Signed)
 History and Physical Interval Note:  03/21/2024 12:00 PM  Sally [name]  has presented today for surgery, with the diagnosis of Other closed nondisplaced 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, subsequent encounter.  The various methods of treatment have been discussed with the patient and family. After consideration of risks, benefits and other options for treatment, the patient has consented to  Procedure(s): ARTHROPLASTY, SHOULDER, TOTAL, REVERSE (Left) as a surgical intervention.  The patient's history has been reviewed, patient examined, no change in status, stable for surgery.  I have reviewed the patient's chart and labs.  Questions were answered to the patient's satisfaction.     Sally [name]

## 2024-03-22 DIAGNOSIS — M75122 Complete rotator cuff tear or rupture of left shoulder, not specified as traumatic: Secondary | ICD-10-CM | POA: Diagnosis not present

## 2024-03-22 NOTE — Progress Notes (Signed)
    Subjective: 1 Day Post-Op Procedure(s) (LRB): ARTHROPLASTY, SHOULDER, TOTAL, REVERSE (Left) Patient reports pain as 7 on 0-10 scale.   Denies CP or SOB.  Voiding without difficulty. Positive flatus. Objective: Vital signs in last 24 hours: Temp:  [97.5 F (36.4 C)-97.7 F (36.5 C)] 97.7 F (36.5 C) (08/23 0531) Pulse Rate:  [67-83] 70 (08/23 0531) Resp:  [10-18] 16 (08/23 0531) BP: (111-139)/(61-82) 111/63 (08/23 0531) SpO2:  [92 %-100 %] 98 % (08/23 0531) Weight:  [92 kg] 92 kg (08/22 1610)  Intake/Output from previous day: 08/22 0701 - 08/23 0700 In: 1523.2 [P.O.:120; I.V.:961.5; IV Piggyback:441.8] Out: 200 [Blood:200] Intake/Output this shift: No intake/output data recorded.  Labs: No results for input(s): HGB in the last 72 hours. No results for input(s): WBC, RBC, HCT, PLT in the last 72 hours. No results for input(s): NA, K, CL, CO2, BUN, CREATININE, GLUCOSE, CALCIUM in the last 72 hours. No results for input(s): LABPT, INR in the last 72 hours.  Physical Exam: Neurologically intact Neurovascular intact Sensation intact distally Intact pulses distally Dorsiflexion/Plantar flexion intact Incision: dressing C/D/I No cellulitis present Compartment soft Bulky dressing removed and replaced with Aquacel Bruising and swelling noted around surgical site Body mass index is 34.81 kg/m.   Assessment/Plan: 1 Day Post-Op Procedure(s) (LRB): ARTHROPLASTY, SHOULDER, TOTAL, REVERSE (Left) Continue Care Continue pain medical management  Continue Incentive spirometry  Continue mobilization with PT/OT Continue to ice surgical site ASA for DVT prophylaxis Plan to D/c to home today pending OT clearance  Jeoffrey LOISE Sages for Dr. Donaciano Sprang [facility] [phone] 03/22/2024, 8:35 AM

## 2024-03-22 NOTE — Care Management Obs Status (Signed)
 MEDICARE OBSERVATION STATUS NOTIFICATION   Patient Details  Name: Sally [name] MRN: [id number] Date of Birth: Aug 26, 1960   Medicare Observation Status Notification Given:  Yes    Ermias Tomeo A Timiya Howells, LCSW 03/22/2024, 9:50 AM

## 2024-03-22 NOTE — Evaluation (Signed)
 Occupational Therapy Evaluation and Discharge Patient Details Name: Sally [name] MRN: [id number] DOB: 10-Aug-1960 Today's Date: 03/22/2024   History of Present Illness   Pt is a 63 year old woman admittedon 03/21/24 for L reverse TSA due to proximal humerus fx malunion. PMH: asthma, PE, DM2, anxiety, depression, RA, fibromyalgia, GERD, ADD.     Clinical Impressions Pt was independent prior to admission and lives with her supportive family. Presents with post operative shoulder pain. All education completed with pt and husband verbalizing understanding. Reinforced education with written handouts. No further acute OT needs.      If plan is discharge home, recommend the following:   A little help with bathing/dressing/bathroom;Assistance with cooking/housework     Functional Status Assessment   Patient has had a recent decline in their functional status and demonstrates the ability to make significant improvements in function in a reasonable and predictable amount of time.     Equipment Recommendations   None recommended by OT     Recommendations for Other Services         Precautions/Restrictions   Precautions Precautions: Shoulder Type of Shoulder Precautions: may use L UE for gentle ROM, AROM L elbow to hand Shoulder Interventions: Shoulder sling/immobilizer;Off for dressing/bathing/exercises Precaution Booklet Issued: Yes (comment) Recall of Precautions/Restrictions: Intact Required Braces or Orthoses: Sling Restrictions Weight Bearing Restrictions Per Provider Order: Yes LUE Weight Bearing Per Provider Order: Non weight bearing     Mobility Bed Mobility Overal bed mobility: Modified Independent                  Transfers Overall transfer level: Modified independent                        Balance                                           ADL either performed or assessed with clinical judgement   ADL Overall  ADL's : Needs assistance/impaired Eating/Feeding: Set up;Bed level   Grooming: Modified independent   Upper Body Bathing: Minimal assistance;Standing   Lower Body Bathing: Minimal assistance;Sit to/from stand   Upper Body Dressing : Minimal assistance;Sitting   Lower Body Dressing: Minimal assistance;Sit to/from stand   Toilet Transfer: Modified Independent             General ADL Comments: Educated patient and husband in positioning L UE in bed and chair, sling use and wearing schedule, benefits of icing, compensatory strategies for ADLs, NWB status and ok to use L UE for gentle ADLs.     Vision Baseline Vision/History: 1 Wears glasses Ability to See in Adequate Light: 0 Adequate Patient Visual Report: No change from baseline       Perception         Praxis         Pertinent Vitals/Pain Pain Assessment Pain Assessment: No/denies pain     Extremity/Trunk Assessment Upper Extremity Assessment Upper Extremity Assessment: Right hand dominant;LUE deficits/detail LUE Deficits / Details: completed AAROM elbow to hand LUE Coordination: decreased gross motor   Lower Extremity Assessment Lower Extremity Assessment: Overall WFL for tasks assessed   Cervical / Trunk Assessment Cervical / Trunk Assessment: Normal   Communication Communication Communication: No apparent difficulties   Cognition Arousal: Alert Behavior During Therapy: WFL for tasks assessed/performed Cognition: No apparent impairments  Following commands: Intact       Cueing  General Comments   Cueing Techniques: Verbal cues      Exercises     Shoulder Instructions      Home Living Family/patient expects to be discharged to:: Private residence Living Arrangements: Spouse/significant other;Children Available Help at Discharge: Family;Available 24 hours/day                                    Prior Functioning/Environment Prior  Level of Function : Independent/Modified Independent                    OT Problem List:     OT Treatment/Interventions:        OT Goals(Current goals can be found in the care plan section)       OT Frequency:       Co-evaluation              AM-PAC OT 6 Clicks Daily Activity     Outcome Measure Help from another person eating meals?: A Little Help from another person taking care of personal grooming?: A Little Help from another person toileting, which includes using toliet, bedpan, or urinal?: A Little Help from another person bathing (including washing, rinsing, drying)?: A Little Help from another person to put on and taking off regular upper body clothing?: A Little Help from another person to put on and taking off regular lower body clothing?: A Little 6 Click Score: 18   End of Session    Activity Tolerance:   Patient left: in bed;with call bell/phone within reach  OT Visit Diagnosis: Pain Pain - Right/Left: Left Pain - part of body: Shoulder                Time: 9159-9088 OT Time Calculation (min): 31 min Charges:  OT General Charges $OT Visit: 1 Visit OT Evaluation $OT Eval Low Complexity: 1 Low OT Treatments $Self Care/Home Management : 8-22 mins  Mliss HERO, OTR/L Acute Rehabilitation Services Office: [phone]   Kennth Mliss Helling 03/22/2024, 10:05 AM

## 2024-03-22 NOTE — Op Note (Unsigned)
 NAMEEDONA, SCHREFFLER MEDICAL RECORD NO: [id number] ACCOUNT NO: [id number] DATE OF BIRTH: 1961/01/30 FACILITY: [facility] LOCATION: [id number] PHYSICIAN: Elspeth SAUNDERS. 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  PROCEDURE PERFORMED:  Left reverse total shoulder arthroplasty using Biomet Comprehensive and Identity System.  ATTENDING SURGEON:  Elspeth SAUNDERS. Kay, MD.  ASSISTANT:  Debby Crock [place], [place], who was scrubbed during the entire procedure, and necessary for satisfactory completion of surgery.  ANESTHESIA:  General anesthesia was used plus interscalene block.  ESTIMATED BLOOD LOSS:  200 mL.  FLUID REPLACEMENT:  1500 mL crystalloid.  COUNTS:  Instrument count was correct.  COMPLICATIONS:  No complications.  ANTIBIOTICS:  Perioperative antibiotics were given.  INDICATIONS:  The patient is a 63 year old female who presents with worsening left shoulder pain secondary to shoulder arthritis, AVN, and a fracture malunion of a proximal humerus fracture.  The patient has very limited range of motion and loss of  strength and constant pain in the shoulder and presents for reverse TSA to decrease pain and to restore function. Informed consent obtained.  DESCRIPTION OF PROCEDURE:  After an adequate level of anesthesia was achieved, the patient was positioned in the modified beach chair position. The left shoulder was correctly identified and sterile prep and drape of the shoulder performed. Timeout  called verifying correct patient and correct site. We entered the shoulder using a standard deltopectoral incision starting at the coracoid process and extending down the anterior humerus.  Dissection down through subcutaneous tissues using Bovie.   Cephalic  vein was identified and taken laterally to the deltoid.  Pectoralis was taken medially.  Conjoined tendon was identified and retracted medially.  Deep retractors were placed.  We then tenodesed the biceps in situ with 0 Vicryl figure-of-eight  suture x2 incorporating part of the pectoralis tendon.  Next, we released the subscapularis, which was thinned and very stiff.  We tagged it for protection of the axillary nerve with no plans to repair it. Once we had that released and the inferior  capsule released, we extended the shoulder and delivered the humeral head out of the wound.  There was advanced arthritic change noted with some collapse.  Once we had the proximal humerus exposed, we entered the proximal humerus with a 4-mm entry reamer  and then reamed up to a size 10. We then placed the resection guide onto the 10-mm reamer and resected the head at a 30-degree retroversion with the offsetting saw. Excess osteophytes were removed with a rongeur.  We then subluxed the humerus  posteriorly, gaining good exposure of the glenoid face.  We removed the capsule, labrum, biceps anchor stump, and once we had good exposure, there was no cartilage remaining on the glenoid at all.  We found our center point for a guide pin and drilled  that bicortically.  We then reamed, so we had a nice little smiley face subchondral reaming for the baseplate. We then impacted the pore-coated baseplate into position.  We removed the guide pin and then checked our depth, which was 25 mm, and we placed  a 25-mm central screw. Once that was down all the way and checked, we placed a superior and inferior 30-mm screw, which was locked into the plate.  So, with the 3 screws in place and very secure, we  selected the real 36 +3 glenosphere and we placed the  glenosphere setting on A and then impacted that against the baseplate.  We did a secondary impactor and made sure that the glenosphere was attached well and there was no soft tissue  caught up in that glenosphere baseplate bearing. We then went back to  the humeral side and did our broaching starting at size 8 broaching up to a size 10. We then trialed with the -6 tray and the +0 poly and we reduced the shoulder.  We were happy with our soft tissue balancing and stability.  We removed all the trial  components. We irrigated thoroughly. We used a available bone graft and then impacted the press-fit size 10 Identity stem with the -6 tray. We used the highly cross-linked polyethylene 36 uniform standard  bearing.  We trialed that and once we had that, we impacted the stem into position. We then selected that tray and impacted that on there and then with the stem and the tray in place, we selected that real 36 standard uniform bearing and attached that to  the tray and then did our secondary impactor. We reduced the shoulder. Nice little pop was reduced, very stable throughout a full arc of motion. I did do some releases on the rotator cuff to make sure that everything glided well and we had nice smooth  rotation and no soft tissue impingement or bony impingement.  We irrigated thoroughly. We then used vancomycin  powder deep 1 g and then resected the subscapular remnant and then closed the deltopectoral interval with 0 Vicryl suture followed by 2-0  Vicryl for subcutaneous closure and 4-0 running Monocryl for skin. Steri-Strips were applied followed by a sterile dressing.  The patient tolerated the surgery well.   NIK D: 03/21/2024 3:00:23 pm T: 03/22/2024 12:45:00 am  JOB: [id number]/ [id number]

## 2024-03-22 NOTE — Plan of Care (Signed)
 Patient remains alert and oriented, verbal of needs. Pain managed with prn medications. SCD's applied. Incentive spirometer given with return demonstration. IV remains patent, flushes with no difficulty noted.   Problem: Education: Goal: Knowledge of General Education information will improve Description: Including pain rating scale, medication(s)/side effects and non-pharmacologic comfort measures Outcome: Progressing   Problem: Health Behavior/Discharge Planning: Goal: Ability to manage health-related needs will improve Outcome: Progressing   Problem: Clinical Measurements: Goal: Ability to maintain clinical measurements within normal limits will improve Outcome: Progressing Goal: Will remain free from infection Outcome: Progressing Goal: Diagnostic test results will improve Outcome: Progressing Goal: Respiratory complications will improve Outcome: Progressing Goal: Cardiovascular complication will be avoided Outcome: Progressing   Problem: Activity: Goal: Risk for activity intolerance will decrease Outcome: Progressing   Problem: Nutrition: Goal: Adequate nutrition will be maintained Outcome: Progressing

## 2024-03-25 ENCOUNTER — Encounter: Payer: Medicare HMO | Admitting: Nurse Practitioner

## 2024-03-25 ENCOUNTER — Encounter (HOSPITAL_COMMUNITY): Payer: Self-pay | Admitting: Orthopedic Surgery

## 2024-03-25 NOTE — Discharge Summary (Addendum)
 In most cases prophylactic antibiotics for Dental procdeures after total joint surgery are not necessary.  Exceptions are as follows:  1. History of prior total joint infection  2. Severely immunocompromised (Organ Transplant, cancer chemotherapy, Rheumatoid biologic meds such as Humera)  3. Poorly controlled diabetes (A1C &gt; 8.0, blood glucose over 200)  If you have one of these conditions, contact your surgeon for an antibiotic prescription, prior to your dental procedure. Orthopedic Discharge Summary        Physician Discharge Summary  Patient ID: Sally [name] MRN: [id number] DOB/AGE: Jun 30, 1961 63 y.o.  Admit date: 03/21/2024 Discharge date: 03/22/24  Procedures:  Procedure(s) (LRB): ARTHROPLASTY, SHOULDER, TOTAL, REVERSE (Left)  Attending Physician:  Dr. Elspeth Her  Admission Diagnoses:   left shoulder cuff arthropathy  Discharge Diagnoses:  left shoulder cuff arthropathy   Past Medical History:  Diagnosis Date   ADD (attention deficit disorder)    Anemia    Cellulitis of left lower extremity    CFS (chronic fatigue syndrome)    Edema of both lower extremities    Fibromyalgia    GAD (generalized anxiety disorder)    GERD (gastroesophageal reflux disease)    History of colitis    History of kidney stones    History of pulmonary embolus (PE)    per pt in 2019 told probable PE left lung, completed blood thnner treatment,  stated no clot before or since   History of sepsis 11/17/2020   hospital admission, dx acute left pyelonephritis secondary to obstruction uropathy due to left ureter stone   History of sepsis 11/17/2020   hospital admission, dx septic shock , e.coli, acute pyelonephritis due to kidney stone obstruction   Hypertension    IBS (irritable bowel syndrome)    Left ureteral stone    MDD (major depressive disorder)    Mild persistent asthma    Osteoarthritis    Pneumonia    RA (rheumatoid arthritis) (HCC)    rheumotology--- dr  ivory   RLS (restless legs syndrome)    Spinal stenosis    Type 2 diabetes mellitus (HCC)    followed by pcp  (12-10-2020 pt stated checks daily in am,  fasting sugar-- 120--140)   Urgency of urination    Vitamin B 12 deficiency    Vitamin D  deficiency    Wears glasses     PCP: Thurmond Cathlyn LABOR., MD   Discharged Condition: good  Hospital Course:  Patient underwent the above stated procedure on 03/21/2024. Patient tolerated the procedure well and brought to the recovery room in good condition and subsequently to the floor. Patient had an uncomplicated hospital course and was stable for discharge.   Disposition: Discharge disposition: 01-Home or Self Care      with follow up in 2 weeks    Follow-up Information     Her Kemps, MD. Call in 2 week(s).   Specialty: Orthopedic Surgery Why: call [phone] for appt in two weeks in the office Contact information: [address] [place] [address] [phone]                 Dental Antibiotics:  In most cases prophylactic antibiotics for Dental procdeures after total joint surgery are not necessary.  Exceptions are as follows:  1. History of prior total joint infection  2. Severely immunocompromised (Organ Transplant, cancer chemotherapy, Rheumatoid biologic meds such as Humera)  3. Poorly controlled diabetes (A1C &gt; 8.0, blood glucose over 200)  If you have one of these  conditions, contact your surgeon for an antibiotic prescription, prior to your dental procedure.    Allergies as of 03/22/2024   No Known Allergies      Medication List     STOP taking these medications    Enbrel  SureClick 50 MG/ML injection Generic drug: etanercept    traMADol  50 MG tablet Commonly known as: ULTRAM        TAKE these medications    albuterol  108 (90 Base) MCG/ACT inhaler Commonly known as: VENTOLIN  HFA Inhale 2 puffs into the lungs every 6 (six) hours as needed. FOR WHEEZING    Azelastine  HCl 137 MCG/SPRAY Soln Place 2 sprays into both nostrils 2 (two) times daily as needed (Allergies).   benzonatate  200 MG capsule Commonly known as: TESSALON  Take 400 mg by mouth every 8 (eight) hours as needed.   budesonide -formoterol  80-4.5 MCG/ACT inhaler Commonly known as: Symbicort  Take 2 puffs first thing in am and then another 2 puffs about 12 hours later.   buPROPion  300 MG 24 hr tablet Commonly known as: WELLBUTRIN  XL Take 300 mg by mouth daily.   cyanocobalamin  1000 MCG tablet Commonly known as: VITAMIN B12 Take 1 tablet (1,000 mcg total) by mouth daily. What changed: when to take this   cyclobenzaprine  10 MG tablet Commonly known as: FLEXERIL  Take 1 tablet (10 mg total) by mouth 3 (three) times daily as needed for muscle spasms.   DULoxetine  30 MG capsule Commonly known as: CYMBALTA  Take 90 mg by mouth daily.   famotidine  20 MG tablet Commonly known as: PEPCID  Please schedule an appointment with the office for further refills. What changed:  how much to take how to take this when to take this additional instructions   folic acid  1 MG tablet Commonly known as: FOLVITE  Take 1 mg by mouth daily.   furosemide  40 MG tablet Commonly known as: LASIX  Take 40 mg by mouth daily.   gabapentin  300 MG capsule Commonly known as: NEURONTIN  Take 600 mg by mouth 2 (two) times daily.   HYDROcodone -acetaminophen  5-325 MG tablet Commonly known as: NORCO/VICODIN Take 1 tablet by mouth every 6 (six) hours as needed for severe pain (pain score 7-10).   meloxicam 15 MG tablet Commonly known as: MOBIC Take 15 mg by mouth daily.   metFORMIN  500 MG tablet Commonly known as: GLUCOPHAGE  Take 500 mg by mouth 2 (two) times daily.   methotrexate  2.5 MG tablet Commonly known as: RHEUMATREX TAKE 8 TABLETS BY MOUTH  WEEKLY   montelukast  10 MG tablet Commonly known as: SINGULAIR  Take 10 mg by mouth at bedtime.   omeprazole  20 MG capsule Commonly known as:  PRILOSEC Take 20 mg by mouth daily.   ondansetron  4 MG tablet Commonly known as: Zofran  Take 1 tablet (4 mg total) by mouth every 8 (eight) hours as needed for vomiting, refractory nausea / vomiting or nausea.   pravastatin  10 MG tablet Commonly known as: PRAVACHOL  Take 10 mg by mouth daily.   Semaglutide  (1 MG/DOSE) 2 MG/1.5ML Sopn Inject 2 mg into the skin once a week.   TYLENOL  ARTHRITIS PAIN PO Take 1-2 tablets by mouth every 8 (eight) hours as needed (Pain).   Vitamin D  (Ergocalciferol ) 1.25 MG (50000 UNIT) Caps capsule Commonly known as: DRISDOL  Take 1 capsule (50,000 Units total) by mouth every 7 (seven) days.          Signed: Debby KATHEE Fireman 03/25/2024, 2:30 PM  [facility]  [place] [address], [place], [place] [address] Phone:  [phone] Facebook  Instagram  [employer]

## 2024-04-23 ENCOUNTER — Encounter: Payer: Self-pay | Admitting: Nurse Practitioner

## 2024-04-23 ENCOUNTER — Ambulatory Visit (INDEPENDENT_AMBULATORY_CARE_PROVIDER_SITE_OTHER): Admitting: Nurse Practitioner

## 2024-04-23 ENCOUNTER — Encounter (HOSPITAL_BASED_OUTPATIENT_CLINIC_OR_DEPARTMENT_OTHER): Payer: Self-pay

## 2024-04-23 VITALS — BP 118/70 | HR 79 | Ht 62.75 in | Wt 198.0 lb

## 2024-04-23 DIAGNOSIS — Z9289 Personal history of other medical treatment: Secondary | ICD-10-CM

## 2024-04-23 DIAGNOSIS — Z9189 Other specified personal risk factors, not elsewhere classified: Secondary | ICD-10-CM

## 2024-04-23 DIAGNOSIS — Z1211 Encounter for screening for malignant neoplasm of colon: Secondary | ICD-10-CM

## 2024-04-23 DIAGNOSIS — R87610 Atypical squamous cells of undetermined significance on cytologic smear of cervix (ASC-US): Secondary | ICD-10-CM | POA: Diagnosis not present

## 2024-04-23 DIAGNOSIS — Z01419 Encounter for gynecological examination (general) (routine) without abnormal findings: Secondary | ICD-10-CM | POA: Diagnosis not present

## 2024-04-23 DIAGNOSIS — Z13820 Encounter for screening for osteoporosis: Secondary | ICD-10-CM

## 2024-04-23 DIAGNOSIS — Z780 Asymptomatic menopausal state: Secondary | ICD-10-CM

## 2024-04-23 NOTE — Progress Notes (Signed)
 Sally [name] Apr 19, 1961 [id number]   History:  63 y.o. [id number] presents for breast and pelvic exam. Postmenopausal - no HRT, no bleeding. Normal pap history. H/O PE, RA, T2DM. Depression and anxiety managed by PCP. Had left shoulder surgery last month.   Gynecologic History Patient's last menstrual period was 05/31/2012.   Contraception/Family planning: post menopausal status Sexually active: Yes  Health Maintenance Last Pap: 03/20/2022. Results were: ASCUS neg HPV, 3-year repeat Last mammogram: 03/23/2023 Results were: Normal Last colonoscopy: 2014. Negative Cologuard 07/2020 Last Dexa: 01/24/2018. Results were: Normal  Past medical history, past surgical history, family history and social history were all reviewed and documented in the EPIC chart. Married. 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 5 sons, 10 grandchildren.   ROS:  A ROS was performed and pertinent positives and negatives are included.  Exam:  Vitals:   04/23/24 1125  BP: 118/70  Pulse: 79  SpO2: 98%  Weight: 198 lb (89.8 kg)  Height: 5' 2.75 (1.594 m)     Body mass index is 35.35 kg/m.  General appearance:  Normal Thyroid:  Symmetrical, normal in size, without palpable masses or nodularity. Respiratory  Auscultation:  Clear without wheezing or rhonchi Cardiovascular  Auscultation:  Regular rate, without rubs, murmurs or gallops  Edema/varicosities:  Not grossly evident Abdominal  Soft,nontender, without masses, guarding or rebound.  Liver/spleen:  No organomegaly noted  Hernia:  None appreciated  Skin  Inspection:  Grossly normal Breasts: Examined lying and sitting.   Right: Without masses, retractions, nipple discharge or axillary adenopathy.   Left: Without masses, retractions, nipple discharge or axillary adenopathy. Pelvic: External genitalia:  no lesions              Urethra:  normal appearing urethra with no masses, tenderness or lesions              Bartholins and  Skenes: normal                 Vagina: normal appearing vagina with normal color and discharge, no lesions              Cervix: no lesions Bimanual Exam:  Uterus:  no masses or tenderness              Adnexa: no mass, fullness, tenderness              Rectovaginal: Deferred              Anus:  normal, no lesions  Patient informed chaperone available to be present for breast and pelvic exam. Patient has requested no chaperone to be present. Patient has been advised what will be completed during breast and pelvic exam.   Assessment/Plan:  63 y.o. [id number] for breast and pelvic exam.   Encounter for breast and pelvic examination - Education provided on SBEs, importance of preventative screenings, current guidelines, high calcium diet, regular exercise, and multivitamin daily.  Labs with PCP.   Postmenopausal - Plan: DG Bone Density. No HRT, no bleeding  Screening for colon cancer - Plan: Cologuard. Neg Cologuard 2022.  Screening for osteoporosis - Plan: DG Bone Density. Normal bone density in 2019.  Screening for cervical cancer - 02/2022 ASCUS neg HPV. Will repeat at 3-year interval per guidelines.   Screening for breast cancer - Normal mammogram history. Overdue d/t shoulder surgery. Plans to schedule once recovered. Continue annual screenings. Normal breast exam today.  Return in about 1 year (around 04/23/2025) for B&P (high  risk).     Sally DELENA Shutter DNP, 12:07 PM 04/23/2024

## 2024-04-30 DIAGNOSIS — M25512 Pain in left shoulder: Secondary | ICD-10-CM | POA: Diagnosis not present

## 2024-04-30 DIAGNOSIS — Z4789 Encounter for other orthopedic aftercare: Secondary | ICD-10-CM | POA: Diagnosis not present

## 2024-05-07 DIAGNOSIS — M25512 Pain in left shoulder: Secondary | ICD-10-CM | POA: Diagnosis not present

## 2024-05-12 DIAGNOSIS — M25512 Pain in left shoulder: Secondary | ICD-10-CM | POA: Diagnosis not present

## 2024-05-14 DIAGNOSIS — M25512 Pain in left shoulder: Secondary | ICD-10-CM | POA: Diagnosis not present

## 2024-05-21 DIAGNOSIS — M25512 Pain in left shoulder: Secondary | ICD-10-CM | POA: Diagnosis not present

## 2024-05-28 DIAGNOSIS — M25512 Pain in left shoulder: Secondary | ICD-10-CM | POA: Diagnosis not present

## 2024-05-30 DIAGNOSIS — I872 Venous insufficiency (chronic) (peripheral): Secondary | ICD-10-CM | POA: Diagnosis not present

## 2024-05-30 DIAGNOSIS — R5381 Other malaise: Secondary | ICD-10-CM | POA: Diagnosis not present

## 2024-05-30 DIAGNOSIS — R5383 Other fatigue: Secondary | ICD-10-CM | POA: Diagnosis not present

## 2024-05-30 DIAGNOSIS — Z23 Encounter for immunization: Secondary | ICD-10-CM | POA: Diagnosis not present

## 2024-05-30 DIAGNOSIS — J4530 Mild persistent asthma, uncomplicated: Secondary | ICD-10-CM | POA: Diagnosis not present

## 2024-05-30 DIAGNOSIS — G5603 Carpal tunnel syndrome, bilateral upper limbs: Secondary | ICD-10-CM | POA: Diagnosis not present

## 2024-05-30 DIAGNOSIS — Z6832 Body mass index (BMI) 32.0-32.9, adult: Secondary | ICD-10-CM | POA: Diagnosis not present

## 2024-05-30 DIAGNOSIS — M159 Polyosteoarthritis, unspecified: Secondary | ICD-10-CM | POA: Diagnosis not present

## 2024-05-30 DIAGNOSIS — I10 Essential (primary) hypertension: Secondary | ICD-10-CM | POA: Diagnosis not present

## 2024-05-30 DIAGNOSIS — J309 Allergic rhinitis, unspecified: Secondary | ICD-10-CM | POA: Diagnosis not present

## 2024-05-30 DIAGNOSIS — E119 Type 2 diabetes mellitus without complications: Secondary | ICD-10-CM | POA: Diagnosis not present

## 2024-06-02 DIAGNOSIS — H5203 Hypermetropia, bilateral: Secondary | ICD-10-CM | POA: Diagnosis not present

## 2024-06-02 DIAGNOSIS — E119 Type 2 diabetes mellitus without complications: Secondary | ICD-10-CM | POA: Diagnosis not present

## 2024-06-03 ENCOUNTER — Encounter (HOSPITAL_BASED_OUTPATIENT_CLINIC_OR_DEPARTMENT_OTHER): Payer: Self-pay

## 2024-06-19 DIAGNOSIS — Z1231 Encounter for screening mammogram for malignant neoplasm of breast: Secondary | ICD-10-CM | POA: Diagnosis not present

## 2024-06-19 DIAGNOSIS — M85852 Other specified disorders of bone density and structure, left thigh: Secondary | ICD-10-CM | POA: Diagnosis not present

## 2024-06-19 DIAGNOSIS — M85851 Other specified disorders of bone density and structure, right thigh: Secondary | ICD-10-CM | POA: Diagnosis not present

## 2024-06-19 LAB — HM MAMMOGRAPHY

## 2024-06-23 DIAGNOSIS — Z1211 Encounter for screening for malignant neoplasm of colon: Secondary | ICD-10-CM | POA: Diagnosis not present

## 2024-07-01 LAB — COLOGUARD: COLOGUARD: NEGATIVE

## 2024-07-02 ENCOUNTER — Ambulatory Visit: Payer: Self-pay | Admitting: Nurse Practitioner

## 2024-07-02 DIAGNOSIS — M1990 Unspecified osteoarthritis, unspecified site: Secondary | ICD-10-CM | POA: Diagnosis not present

## 2024-07-02 DIAGNOSIS — M549 Dorsalgia, unspecified: Secondary | ICD-10-CM | POA: Diagnosis not present

## 2024-07-02 DIAGNOSIS — M797 Fibromyalgia: Secondary | ICD-10-CM | POA: Diagnosis not present

## 2024-07-02 DIAGNOSIS — M25569 Pain in unspecified knee: Secondary | ICD-10-CM | POA: Diagnosis not present

## 2024-07-02 DIAGNOSIS — I878 Other specified disorders of veins: Secondary | ICD-10-CM | POA: Diagnosis not present

## 2024-07-02 DIAGNOSIS — M79643 Pain in unspecified hand: Secondary | ICD-10-CM | POA: Diagnosis not present

## 2024-07-02 DIAGNOSIS — M0579 Rheumatoid arthritis with rheumatoid factor of multiple sites without organ or systems involvement: Secondary | ICD-10-CM | POA: Diagnosis not present

## 2024-07-02 DIAGNOSIS — Z79899 Other long term (current) drug therapy: Secondary | ICD-10-CM | POA: Diagnosis not present

## 2024-07-02 DIAGNOSIS — M7989 Other specified soft tissue disorders: Secondary | ICD-10-CM | POA: Diagnosis not present

## 2024-08-04 ENCOUNTER — Encounter (HOSPITAL_BASED_OUTPATIENT_CLINIC_OR_DEPARTMENT_OTHER): Payer: Self-pay

## 2024-08-04 ENCOUNTER — Ambulatory Visit (HOSPITAL_BASED_OUTPATIENT_CLINIC_OR_DEPARTMENT_OTHER)
Admission: RE | Admit: 2024-08-04 | Discharge: 2024-08-04 | Disposition: A | Discharge status: Home / Self Care | Source: Ambulatory Visit | Attending: Nurse Practitioner | Admitting: Nurse Practitioner

## 2024-08-04 VITALS — BP 105/68 | HR 79 | Temp 98.1°F | Resp 18

## 2024-08-04 DIAGNOSIS — J4521 Mild intermittent asthma with (acute) exacerbation: Secondary | ICD-10-CM

## 2024-08-04 DIAGNOSIS — J069 Acute upper respiratory infection, unspecified: Secondary | ICD-10-CM | POA: Diagnosis not present

## 2024-08-04 MED ORDER — HYDROCOD POLI-CHLORPHE POLI ER 10-8 MG/5ML PO SUER: 5.0000 mL | Freq: Every evening | ORAL | 0 refills | Status: AC | PRN

## 2024-08-04 MED ORDER — PREDNISONE 20 MG PO TABS: 40.0000 mg | ORAL_TABLET | Freq: Every day | ORAL | 0 refills | Status: AC

## 2024-08-04 MED ORDER — MUCINEX DM MAXIMUM STRENGTH 60-1200 MG PO TB12: 1.0000 | ORAL_TABLET | Freq: Two times a day (BID) | ORAL | 0 refills | Status: AC

## 2024-08-04 MED ORDER — IPRATROPIUM-ALBUTEROL 0.5-2.5 (3) MG/3ML IN SOLN
3.0000 mL | Freq: Once | RESPIRATORY_TRACT | Status: AC
  Administered 2024-08-04: 3 mL via RESPIRATORY_TRACT

## 2024-08-04 MED ORDER — BENZONATATE 200 MG PO CAPS: 200.0000 mg | ORAL_CAPSULE | Freq: Three times a day (TID) | ORAL | 0 refills | Status: AC | PRN

## 2024-08-04 MED ORDER — METHYLPREDNISOLONE SODIUM SUCC 125 MG IJ SOLR
62.5000 mg | Freq: Once | INTRAMUSCULAR | Status: AC
  Administered 2024-08-04: 62.5 mg via INTRAMUSCULAR

## 2024-08-04 NOTE — Discharge Instructions (Addendum)
 Your symptoms are most likely due to a respiratory infection that has cause your asthma to flare. These infections are usually caused by a virus, which means antibiotics will not help since they only treat bacterial infections. Please take any medications prescribed to you as directed.  Several over-the-counter medicines can make you more comfortable. Acetaminophen  (Tylenol ) or ibuprofen  (Advil , Motrin ) can help with fever, body aches, or sore throat pain. Saline nasal sprays or rinses can be used often to keep your nose clear. Sore throat discomfort may improve with lozenges, menthol  or benzocaine sprays, or warm saltwater gargles. These medicines do not cure the virus but can help you feel better as your body recovers.  It is important to drink plenty of fluids to stay hydrated and help thin mucus. Aim for urine that is pale yellow. Using a cool mist humidifier at home, inhaling steam several times a day, and avoiding cool or dry air may also ease congestion. Sleeping with your head elevated can reduce post-nasal drainage, and getting enough rest each night supports recovery. Remember to replace your toothbrush once you start feeling better.  A cough may last for several weeks after a respiratory illness even when other symptoms resolve, as the airways remain irritated. As long as the cough gradually improves and no new concerning symptoms appear, this is part of normal healing.  If your symptoms worsen or you develop new problems such as trouble breathing, chest pain, or high fever, go to the emergency room right away.

## 2024-08-04 NOTE — ED Triage Notes (Signed)
 Pt c/o nasal congestion, coughing started Thursday she lost her voice Saturday. Pt has hx of Asthma

## 2024-08-04 NOTE — ED Provider Notes (Signed)
 " [facility]    CSN: [id number] Arrival date & time: 08/04/24  1512      History   Chief Complaint Chief Complaint  Patient presents with   Cough    Nasal congestion and asthma and not feeling well - Entered by patient    HPI Sally [name] is a 64 y.o. female.   Discussed the use of AI scribe software for clinical note transcription with the patient, who gave verbal consent to proceed.   Sally [name] presents with nasal congestion and hoarseness that began Thursday, now on day four of symptoms. She reports the symptoms have progressed to involve her chest and describes post-nasal drip going down the back of her throat. She has been coughing up sputum and experiencing sneezing. Her throat was painful all day Thursday morning. She reports new onset wheezing, which she states always comes after when she gets sick. She has a history of asthma and has required use of her rescue inhaler during this illness episode. She takes Symbicort  twice daily and Singulair  daily for allergies. She reports that her asthma flares every time she gets sick. She was around someone who was sick prior to symptom onset, though they appeared to be getting better. She denies fevers, chills, body aches, nausea, vomiting, diarrhea, headaches, chest tightness, or shortness of breath. She does not smoke or vape. She has a humidifier at home.  The following sections of the patient's history were reviewed and updated as appropriate: allergies, current medications, past family history, past medical history, past social history, past surgical history, and problem list.      Past Medical History:  Diagnosis Date   ADD (attention deficit disorder)    Anemia    Cellulitis of left lower extremity    CFS (chronic fatigue syndrome)    Edema of both lower extremities    Fibromyalgia    GAD (generalized anxiety disorder)    GERD (gastroesophageal reflux disease)    History of colitis    History of kidney  stones    History of pulmonary embolus (PE)    per pt in 2019 told probable PE left lung, completed blood thnner treatment,  stated no clot before or since   History of sepsis 11/17/2020   hospital admission, dx acute left pyelonephritis secondary to obstruction uropathy due to left ureter stone   History of sepsis 11/17/2020   hospital admission, dx septic shock , e.coli, acute pyelonephritis due to kidney stone obstruction   Hypertension    IBS (irritable bowel syndrome)    Left ureteral stone    MDD (major depressive disorder)    Mild persistent asthma    Osteoarthritis    Pneumonia    RA (rheumatoid arthritis) (HCC)    rheumotology--- dr ivory   RLS (restless legs syndrome)    Spinal stenosis    Type 2 diabetes mellitus (HCC)    followed by pcp  (12-10-2020 pt stated checks daily in am,  fasting sugar-- 120--140)   Urgency of urination    Vitamin B 12 deficiency    Vitamin D  deficiency    Wears glasses     Patient Active Problem List   Diagnosis Date Noted   H/O total shoulder replacement, 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   Gastroesophageal reflux disease 11/09/2022   Absolute anemia 04/16/2022   Cough variant asthma vs UACS 01/06/2021   Abnormal CT of the chest 01/06/2021   Upper airway cough syndrome 01/05/2021   Sepsis (HCC) 11/17/2020   Elevated blood pressure  reading without diagnosis of hypertension 06/24/2020   Depression with anxiety 04/27/2020   Diabetes mellitus (HCC) 11/10/2019   Morbid obesity (HCC) 10/28/2019   Vitamin D  deficiency 10/23/2019   Colitis    RLS (restless legs syndrome)    IBS (irritable bowel syndrome)     Past Surgical History:  Procedure Laterality Date   COLONOSCOPY  2012   CYSTOSCOPY W/ URETERAL STENT PLACEMENT Left 11/17/2020   Procedure: CYSTOSCOPY LEFT WITH RETROGRADE PYELOGRAM/URETERAL STENT PLACEMENT LEFT DOUBLE J PLACEMENT ;  Surgeon: Matilda Senior, MD;  Location: [facility] OR;  Service: Urology;  Laterality: Left;   CYSTOSCOPY WITH RETROGRADE  PYELOGRAM, URETEROSCOPY AND STENT PLACEMENT Left 12/13/2020   Procedure: CYSTOSCOPY WITH RETROGRADE PYELOGRAM, URETEROSCOPY AND STENT PLACEMENT;  Surgeon: Matilda Senior, MD;  Location: [facility];  Service: Urology;  Laterality: Left;  75 MINS   EYE SURGERY  chlid   pt stated removal foreign body, unsure which eye   HOLMIUM LASER APPLICATION Left 12/13/2020   Procedure: HOLMIUM LASER APPLICATION;  Surgeon: Matilda Senior, MD;  Location: [facility];  Service: Urology;  Laterality: Left;   MOUTH SURGERY     REVERSE SHOULDER ARTHROPLASTY Left 03/21/2024   Procedure: ARTHROPLASTY, SHOULDER, TOTAL, REVERSE;  Surgeon: Kay Kemps, MD;  Location: [facility] OR[facility];  Service: Orthopedics;  Laterality: Left;    OB History     Gravida  6   Para  5   Term  5   Preterm      AB  1   Living  5      SAB      IAB      Ectopic      Multiple      Live Births               Home Medications    Prior to Admission medications  Medication Sig Start Date End Date Taking? Authorizing Provider  benzonatate  (TESSALON ) 200 MG capsule Take 1 capsule (200 mg total) by mouth 3 (three) times daily as needed for cough. 08/04/24  Yes Shareena Nusz, FNP  buPROPion  (WELLBUTRIN  XL) 300 MG 24 hr tablet Take 300 mg by mouth daily. 02/27/23  Yes [provider]  chlorpheniramine-HYDROcodone  (TUSSIONEX) 10-8 MG/5ML Take 5 mLs by mouth at bedtime as needed for cough. 08/04/24  Yes Iola Lukes, FNP  Dextromethorphan-guaiFENesin  (MUCINEX  DM MAXIMUM STRENGTH) 60-1200 MG TB12 Take 1 tablet by mouth 2 (two) times daily. 08/04/24  Yes Iola Lukes, FNP  DULoxetine  (CYMBALTA ) 30 MG capsule Take 90 mg by mouth daily. 10/31/17  Yes [provider]  gabapentin  (NEURONTIN ) 300 MG capsule Take 600 mg by mouth 2 (two) times daily. 10/22/17  Yes [provider]  meloxicam (MOBIC) 15 MG tablet Take 15 mg by mouth daily. 02/20/22  Yes [provider]   metFORMIN  (GLUCOPHAGE ) 500 MG tablet Take 500 mg by mouth 2 (two) times daily. 02/25/24  Yes [provider]  omeprazole  (PRILOSEC) 20 MG capsule Take 20 mg by mouth daily. 01/14/24  Yes [provider]  pravastatin  (PRAVACHOL ) 10 MG tablet Take 10 mg by mouth daily. 01/10/24  Yes [provider]  predniSONE  (DELTASONE ) 20 MG tablet Take 2 tablets (40 mg total) by mouth daily for 5 days. 08/04/24 08/09/24 Yes Jamariyah Johannsen, Lukes, FNP  Vitamin D , Ergocalciferol , (DRISDOL ) 1.25 MG (50000 UNIT) CAPS capsule Take 1 capsule (50,000 Units total) by mouth every 7 (seven) days. 04/05/23  Yes Whitmire, Dawn, FNP  Acetaminophen  (TYLENOL  ARTHRITIS PAIN PO) Take 1-2  tablets by mouth every 8 (eight) hours as needed (Pain).    [provider]  albuterol  (VENTOLIN  HFA) 108 (90 Base) MCG/ACT inhaler Inhale 2 puffs into the lungs every 6 (six) hours as needed. FOR WHEEZING 06/15/22   Darlean Ozell NOVAK, MD  budesonide -formoterol  (SYMBICORT ) 80-4.5 MCG/ACT inhaler Take 2 puffs first thing in am and then another 2 puffs about 12 hours later. 06/15/22   Darlean Ozell NOVAK, MD  cyanocobalamin  (VITAMIN B12) 1000 MCG tablet Take 1 tablet (1,000 mcg total) by mouth daily. Patient taking differently: Take 1,000 mcg by mouth every other day. 11/09/22   Whitmire, Dawn, FNP  cyclobenzaprine  (FLEXERIL ) 10 MG tablet Take 1 tablet (10 mg total) by mouth 3 (three) times daily as needed for muscle spasms. 03/21/24   Kay Kemps, MD  Etanercept  (ENBREL  SURECLICK [place]) Inject 50 mg into the skin once a week.    [provider]  famotidine  (PEPCID ) 20 MG tablet Please schedule an appointment with the office for further refills. Patient taking differently: Take 20 mg by mouth at bedtime. 06/15/22   Darlean Ozell NOVAK, MD  folic acid  (FOLVITE ) 1 MG tablet Take 1 mg by mouth daily.    [provider]  furosemide  (LASIX ) 40 MG tablet Take 40 mg by mouth daily. 12/19/23   [provider]   HYDROcodone -acetaminophen  (NORCO/VICODIN) 5-325 MG tablet Take 1 tablet by mouth every 6 (six) hours as needed for severe pain (pain score 7-10). 03/21/24   Kay Kemps, MD  methotrexate  (RHEUMATREX) 2.5 MG tablet TAKE 8 TABLETS BY MOUTH  WEEKLY 03/16/22   [provider]  montelukast  (SINGULAIR ) 10 MG tablet Take 10 mg by mouth at bedtime.    [provider]  ondansetron  (ZOFRAN ) 4 MG tablet Take 1 tablet (4 mg total) by mouth every 8 (eight) hours as needed for vomiting, refractory nausea / vomiting or nausea. 03/21/24 03/21/25  Kay Kemps, MD  Semaglutide , 1 MG/DOSE, 2 MG/1.5ML SOPN Inject 2 mg into the skin once a week.    [provider]    Family History Family History  Problem Relation Age of Onset   Pneumonia Mother    Asthma Mother    Diabetes Mother    Hypertension Mother    Depression Mother    Anxiety disorder Mother    Eating disorder Mother    Obesity Mother    Sudden death Mother    Cancer Father        Brain tumor   Asthma Father     Social History Social History[1]   Allergies   Patient has no known allergies.   Review of Systems Review of Systems  Constitutional:  Positive for chills. Negative for fever.  HENT:  Positive for congestion, postnasal drip, rhinorrhea, sneezing, sore throat and voice change (hoarse).   Respiratory:  Positive for cough, chest tightness and wheezing. Negative for shortness of breath.        Chest congestion   Gastrointestinal:  Negative for diarrhea, nausea and vomiting.  Musculoskeletal:  Negative for myalgias.  Neurological:  Positive for headaches.  All other systems reviewed and are negative.    Physical Exam Triage Vital Signs ED Triage Vitals  Encounter Vitals Group     BP 08/04/24 1558 105/68     Girls Systolic BP Percentile --      Girls Diastolic BP Percentile --      Boys Systolic BP Percentile --      Boys Diastolic BP Percentile --  Pulse Rate 08/04/24 1558 79     Resp  08/04/24 1558 18     Temp 08/04/24 1558 98.1 F (36.7 C)     Temp Source 08/04/24 1558 Oral     SpO2 08/04/24 1558 98 %     Weight --      Height --      Head Circumference --      Peak Flow --      Pain Score 08/04/24 1557 0     Pain Loc --      Pain Education --      Exclude from Growth Chart --    No data found.  Updated Vital Signs BP 105/68 (BP Location: Right Arm)   Pulse 79   Temp 98.1 F (36.7 C) (Oral)   Resp 18   LMP 05/31/2012   SpO2 98%   Visual Acuity Right Eye Distance:   Left Eye Distance:   Bilateral Distance:    Right Eye Near:   Left Eye Near:    Bilateral Near:     Physical Exam Vitals reviewed.  Constitutional:      General: She is awake. She is not in acute distress.    Appearance: Normal appearance. She is well-developed. She is not ill-appearing, toxic-appearing or diaphoretic.  HENT:     Head: Normocephalic.     Right Ear: Hearing, tympanic membrane, ear canal and external ear normal. No drainage, swelling or tenderness. No middle ear effusion. Tympanic membrane is not erythematous.     Left Ear: Hearing, tympanic membrane, ear canal and external ear normal. No drainage, swelling or tenderness.  No middle ear effusion. Tympanic membrane is not erythematous.     Nose: Congestion present.     Mouth/Throat:     Lips: Pink.     Mouth: Mucous membranes are moist.     Pharynx: Uvula midline. No pharyngeal swelling, oropharyngeal exudate, posterior oropharyngeal erythema or uvula swelling.     Tonsils: No tonsillar exudate or tonsillar abscesses.  Eyes:     General: Vision grossly intact.     Conjunctiva/sclera: Conjunctivae normal.  Cardiovascular:     Rate and Rhythm: Normal rate and regular rhythm.     Heart sounds: Normal heart sounds.  Pulmonary:     Effort: Pulmonary effort is normal. No tachypnea or respiratory distress.     Breath sounds: Normal breath sounds and air entry. No decreased breath sounds or wheezing.     Comments:  Respirations even and unlabored  Musculoskeletal:        General: Normal range of motion.     Cervical back: Full passive range of motion without pain, normal range of motion and neck supple.  Lymphadenopathy:     Cervical: No cervical adenopathy.  Skin:    General: Skin is warm and dry.  Neurological:     General: No focal deficit present.     Mental Status: She is alert and oriented to person, place, and time.  Psychiatric:        Speech: Speech normal.        Behavior: Behavior is cooperative.      UC Treatments / Results  Labs (all labs ordered are listed, but only abnormal results are displayed) Labs Reviewed - No data to display  EKG   Radiology No results found.  Procedures Procedures (including critical care time)  Medications Ordered in UC Medications  ipratropium-albuterol  (DUONEB) 0.5-2.5 (3) MG/3ML nebulizer solution 3 mL (3 mLs Nebulization Given 08/04/24 1721)  methylPREDNISolone   sodium succinate (SOLU-MEDROL ) 125 mg/2 mL injection 62.5 mg (62.5 mg Intramuscular Given 08/04/24 1721)    Initial Impression / Assessment and Plan / UC Course  I have reviewed the triage vital signs and the nursing notes.  Pertinent labs & imaging results that were available during my care of the patient were reviewed by me and considered in my medical decision making (see chart for details).     The patient presents with symptoms consistent with a viral upper respiratory infection. Exam is reassuring and no evidence of bacterial infection or acute cardiopulmonary process is noted. Current symptoms is causing an asthma flare. Supportive care is recommended. Patient was advised to follow up with primary care if symptoms do not improve within one week or if new concerns arise. Instructions were given to seek emergency care if symptoms worsen, including shortness of breath, chest pain, persistent high fever, inability to tolerate fluids, or confusion.  Today's evaluation has revealed  no signs of a dangerous process. Discussed diagnosis with patient and/or guardian. Patient and/or guardian aware of their diagnosis, possible red flag symptoms to watch out for and need for close follow up. Patient and/or guardian understands verbal and written discharge instructions. Patient and/or guardian comfortable with plan and disposition.  Patient and/or guardian has a clear mental status at this time, good insight into illness (after discussion and teaching) and has clear judgment to make decisions regarding their care  Documentation was completed with the aid of voice recognition software. Transcription may contain typographical errors.   Final Clinical Impressions(s) / UC Diagnoses   Final diagnoses:  Viral upper respiratory tract infection  Mild intermittent asthma with acute exacerbation     Discharge Instructions      Your symptoms are most likely due to a respiratory infection that has cause your asthma to flare. These infections are usually caused by a virus, which means antibiotics will not help since they only treat bacterial infections. Please take any medications prescribed to you as directed.  Several over-the-counter medicines can make you more comfortable. Acetaminophen  (Tylenol ) or ibuprofen  (Advil , Motrin ) can help with fever, body aches, or sore throat pain. Saline nasal sprays or rinses can be used often to keep your nose clear. Sore throat discomfort may improve with lozenges, menthol  or benzocaine sprays, or warm saltwater gargles. These medicines do not cure the virus but can help you feel better as your body recovers.  It is important to drink plenty of fluids to stay hydrated and help thin mucus. Aim for urine that is pale yellow. Using a cool mist humidifier at home, inhaling steam several times a day, and avoiding cool or dry air may also ease congestion. Sleeping with your head elevated can reduce post-nasal drainage, and getting enough rest each night supports  recovery. Remember to replace your toothbrush once you start feeling better.  A cough may last for several weeks after a respiratory illness even when other symptoms resolve, as the airways remain irritated. As long as the cough gradually improves and no new concerning symptoms appear, this is part of normal healing.  If your symptoms worsen or you develop new problems such as trouble breathing, chest pain, or high fever, go to the emergency room right away.         ED Prescriptions     Medication Sig Dispense Auth. Provider   predniSONE  (DELTASONE ) 20 MG tablet Take 2 tablets (40 mg total) by mouth daily for 5 days. 10 tablet Iola Lukes, FNP   chlorpheniramine-HYDROcodone  (  TUSSIONEX) 10-8 MG/5ML Take 5 mLs by mouth at bedtime as needed for cough. 35 mL Salisha Bardsley, Lucie, FNP   Dextromethorphan-guaiFENesin  (MUCINEX  DM MAXIMUM STRENGTH) 60-1200 MG TB12 Take 1 tablet by mouth 2 (two) times daily. 20 tablet Iola Lucie, FNP   benzonatate  (TESSALON ) 200 MG capsule Take 1 capsule (200 mg total) by mouth 3 (three) times daily as needed for cough. 30 capsule Iola Lucie, FNP      I have reviewed the PDMP during this encounter.      [1]  Social History Tobacco Use   Smoking status: Former    Current packs/day: 0.00    Average packs/day: 1.5 packs/day for 8.0 years (12.0 ttl pk-yrs)    Types: Cigarettes    Start date: 12/10/1988    Quit date: 12/10/1996    Years since quitting: 27.6   Smokeless tobacco: Never  Vaping Use   Vaping status: Never Used  Substance Use Topics   Alcohol use: No    Alcohol/week: 0.0 standard drinks of alcohol   Drug use: No     Iola Lucie, FNP 08/04/24 1812  "

## 2024-08-14 ENCOUNTER — Ambulatory Visit (HOSPITAL_BASED_OUTPATIENT_CLINIC_OR_DEPARTMENT_OTHER)

## 2025-04-28 ENCOUNTER — Encounter: Admitting: Nurse Practitioner
